# Patient Record
Sex: Female | Born: 1935 | Race: White | Hispanic: No | Marital: Single | Smoking: Never smoker
Health system: Southern US, Community
[De-identification: ages and names within clinical notes are randomized; demographics above are authoritative.]

## PROBLEM LIST (undated history)

## (undated) DIAGNOSIS — K8020 Calculus of gallbladder without cholecystitis without obstruction: Secondary | ICD-10-CM

## (undated) DIAGNOSIS — I82409 Acute embolism and thrombosis of unspecified deep veins of unspecified lower extremity: Secondary | ICD-10-CM

## (undated) DIAGNOSIS — G4730 Sleep apnea, unspecified: Secondary | ICD-10-CM

## (undated) DIAGNOSIS — I10 Essential (primary) hypertension: Secondary | ICD-10-CM

## (undated) DIAGNOSIS — R42 Dizziness and giddiness: Secondary | ICD-10-CM

## (undated) DIAGNOSIS — E039 Hypothyroidism, unspecified: Secondary | ICD-10-CM

## (undated) DIAGNOSIS — N3281 Overactive bladder: Secondary | ICD-10-CM

## (undated) DIAGNOSIS — E785 Hyperlipidemia, unspecified: Secondary | ICD-10-CM

## (undated) DIAGNOSIS — I2699 Other pulmonary embolism without acute cor pulmonale: Secondary | ICD-10-CM

## (undated) DIAGNOSIS — I4891 Unspecified atrial fibrillation: Secondary | ICD-10-CM

## (undated) DIAGNOSIS — N200 Calculus of kidney: Secondary | ICD-10-CM

## (undated) DIAGNOSIS — C4492 Squamous cell carcinoma of skin, unspecified: Secondary | ICD-10-CM

## (undated) HISTORY — DX: Hypothyroidism, unspecified: E03.9

## (undated) HISTORY — DX: Acute embolism and thrombosis of unspecified deep veins of unspecified lower extremity: I82.409

## (undated) HISTORY — DX: Overactive bladder: N32.81

## (undated) HISTORY — PX: TONSILLECTOMY AND ADENOIDECTOMY: SUR1326

## (undated) HISTORY — DX: Unspecified atrial fibrillation: I48.91

## (undated) HISTORY — PX: REPLACEMENT TOTAL KNEE: SUR1224

## (undated) HISTORY — DX: Other pulmonary embolism without acute cor pulmonale: I26.99

## (undated) HISTORY — DX: Calculus of kidney: N20.0

## (undated) HISTORY — DX: Essential (primary) hypertension: I10

## (undated) HISTORY — DX: Sleep apnea, unspecified: G47.30

## (undated) HISTORY — DX: Hyperlipidemia, unspecified: E78.5

## (undated) HISTORY — DX: Dizziness and giddiness: R42

## (undated) HISTORY — DX: Squamous cell carcinoma of skin, unspecified: C44.92

## (undated) HISTORY — PX: CHOLECYSTECTOMY: SHX55

## (undated) HISTORY — DX: Calculus of gallbladder without cholecystitis without obstruction: K80.20

---

## 1998-03-13 ENCOUNTER — Ambulatory Visit (HOSPITAL_COMMUNITY): Admission: RE | Admit: 1998-03-13 | Discharge: 1998-03-13 | Payer: Self-pay | Admitting: Obstetrics and Gynecology

## 1998-08-14 ENCOUNTER — Inpatient Hospital Stay (HOSPITAL_COMMUNITY): Admission: EM | Admit: 1998-08-14 | Discharge: 1998-08-17 | Payer: Self-pay

## 1998-08-19 ENCOUNTER — Encounter (HOSPITAL_COMMUNITY): Admission: RE | Admit: 1998-08-19 | Discharge: 1998-11-17 | Payer: Self-pay | Admitting: Internal Medicine

## 1999-11-04 ENCOUNTER — Encounter: Payer: Self-pay | Admitting: Orthopedic Surgery

## 1999-11-13 ENCOUNTER — Inpatient Hospital Stay (HOSPITAL_COMMUNITY): Admission: RE | Admit: 1999-11-13 | Discharge: 1999-11-18 | Payer: Self-pay | Admitting: Orthopedic Surgery

## 2000-08-08 ENCOUNTER — Emergency Department (HOSPITAL_COMMUNITY): Admission: EM | Admit: 2000-08-08 | Discharge: 2000-08-08 | Payer: Self-pay | Admitting: Emergency Medicine

## 2000-09-16 ENCOUNTER — Other Ambulatory Visit: Admission: RE | Admit: 2000-09-16 | Discharge: 2000-09-16 | Payer: Self-pay | Admitting: Obstetrics and Gynecology

## 2001-01-11 ENCOUNTER — Emergency Department (HOSPITAL_COMMUNITY): Admission: EM | Admit: 2001-01-11 | Discharge: 2001-01-11 | Payer: Self-pay | Admitting: Emergency Medicine

## 2001-01-11 ENCOUNTER — Encounter: Payer: Self-pay | Admitting: Emergency Medicine

## 2001-01-21 ENCOUNTER — Emergency Department (HOSPITAL_COMMUNITY): Admission: EM | Admit: 2001-01-21 | Discharge: 2001-01-21 | Payer: Self-pay | Admitting: Emergency Medicine

## 2001-08-07 ENCOUNTER — Encounter (INDEPENDENT_AMBULATORY_CARE_PROVIDER_SITE_OTHER): Payer: Self-pay | Admitting: Specialist

## 2001-08-07 ENCOUNTER — Ambulatory Visit (HOSPITAL_BASED_OUTPATIENT_CLINIC_OR_DEPARTMENT_OTHER): Admission: RE | Admit: 2001-08-07 | Discharge: 2001-08-07 | Payer: Self-pay | Admitting: Plastic Surgery

## 2002-06-08 ENCOUNTER — Ambulatory Visit (HOSPITAL_BASED_OUTPATIENT_CLINIC_OR_DEPARTMENT_OTHER): Admission: RE | Admit: 2002-06-08 | Discharge: 2002-06-08 | Payer: Self-pay | Admitting: Plastic Surgery

## 2002-06-08 ENCOUNTER — Encounter (INDEPENDENT_AMBULATORY_CARE_PROVIDER_SITE_OTHER): Payer: Self-pay | Admitting: Specialist

## 2002-09-21 ENCOUNTER — Emergency Department (HOSPITAL_COMMUNITY): Admission: EM | Admit: 2002-09-21 | Discharge: 2002-09-21 | Payer: Self-pay | Admitting: Emergency Medicine

## 2003-02-28 ENCOUNTER — Emergency Department (HOSPITAL_COMMUNITY): Admission: EM | Admit: 2003-02-28 | Discharge: 2003-02-28 | Payer: Self-pay | Admitting: Emergency Medicine

## 2003-03-18 ENCOUNTER — Ambulatory Visit (HOSPITAL_COMMUNITY): Admission: RE | Admit: 2003-03-18 | Discharge: 2003-03-18 | Payer: Self-pay

## 2003-04-09 ENCOUNTER — Encounter (HOSPITAL_BASED_OUTPATIENT_CLINIC_OR_DEPARTMENT_OTHER): Admission: RE | Admit: 2003-04-09 | Discharge: 2003-06-11 | Payer: Self-pay | Admitting: Internal Medicine

## 2003-07-12 ENCOUNTER — Encounter (HOSPITAL_BASED_OUTPATIENT_CLINIC_OR_DEPARTMENT_OTHER): Admission: RE | Admit: 2003-07-12 | Discharge: 2003-07-24 | Payer: Self-pay | Admitting: Internal Medicine

## 2003-10-11 ENCOUNTER — Encounter (HOSPITAL_BASED_OUTPATIENT_CLINIC_OR_DEPARTMENT_OTHER): Admission: RE | Admit: 2003-10-11 | Discharge: 2003-10-28 | Payer: Self-pay | Admitting: Internal Medicine

## 2004-01-15 ENCOUNTER — Encounter (HOSPITAL_BASED_OUTPATIENT_CLINIC_OR_DEPARTMENT_OTHER): Admission: RE | Admit: 2004-01-15 | Discharge: 2004-01-29 | Payer: Self-pay | Admitting: Internal Medicine

## 2005-06-18 ENCOUNTER — Emergency Department (HOSPITAL_COMMUNITY): Admission: EM | Admit: 2005-06-18 | Discharge: 2005-06-19 | Payer: Self-pay | Admitting: Emergency Medicine

## 2007-01-17 ENCOUNTER — Ambulatory Visit: Payer: Self-pay | Admitting: Internal Medicine

## 2007-02-07 ENCOUNTER — Encounter: Payer: Self-pay | Admitting: Internal Medicine

## 2007-02-07 ENCOUNTER — Ambulatory Visit: Payer: Self-pay | Admitting: Internal Medicine

## 2007-12-13 ENCOUNTER — Other Ambulatory Visit: Admission: RE | Admit: 2007-12-13 | Discharge: 2007-12-13 | Payer: Self-pay | Admitting: Obstetrics & Gynecology

## 2008-02-21 HISTORY — PX: HYSTEROSCOPY WITH D & C: SHX1775

## 2008-03-18 ENCOUNTER — Encounter: Payer: Self-pay | Admitting: Obstetrics & Gynecology

## 2008-03-18 ENCOUNTER — Ambulatory Visit (HOSPITAL_BASED_OUTPATIENT_CLINIC_OR_DEPARTMENT_OTHER): Admission: RE | Admit: 2008-03-18 | Discharge: 2008-03-18 | Payer: Self-pay | Admitting: Obstetrics & Gynecology

## 2009-07-28 DIAGNOSIS — I872 Venous insufficiency (chronic) (peripheral): Secondary | ICD-10-CM | POA: Insufficient documentation

## 2009-07-28 DIAGNOSIS — F329 Major depressive disorder, single episode, unspecified: Secondary | ICD-10-CM | POA: Insufficient documentation

## 2009-07-28 DIAGNOSIS — I471 Supraventricular tachycardia: Secondary | ICD-10-CM | POA: Insufficient documentation

## 2009-07-28 DIAGNOSIS — Z8719 Personal history of other diseases of the digestive system: Secondary | ICD-10-CM | POA: Insufficient documentation

## 2009-09-09 DIAGNOSIS — I8290 Acute embolism and thrombosis of unspecified vein: Secondary | ICD-10-CM | POA: Insufficient documentation

## 2010-01-28 DIAGNOSIS — Z87448 Personal history of other diseases of urinary system: Secondary | ICD-10-CM | POA: Insufficient documentation

## 2010-02-04 ENCOUNTER — Emergency Department (HOSPITAL_COMMUNITY): Admission: EM | Admit: 2010-02-04 | Discharge: 2010-02-04 | Payer: Self-pay | Admitting: Emergency Medicine

## 2010-04-29 ENCOUNTER — Ambulatory Visit (HOSPITAL_COMMUNITY): Admission: RE | Admit: 2010-04-29 | Discharge: 2010-04-30 | Payer: Self-pay | Admitting: General Surgery

## 2010-04-29 ENCOUNTER — Encounter (INDEPENDENT_AMBULATORY_CARE_PROVIDER_SITE_OTHER): Payer: Self-pay | Admitting: General Surgery

## 2010-05-04 DIAGNOSIS — Z2839 Other underimmunization status: Secondary | ICD-10-CM | POA: Insufficient documentation

## 2010-11-05 LAB — PROTIME-INR
INR: 1.32 (ref 0.00–1.49)
Prothrombin Time: 16.6 seconds — ABNORMAL HIGH (ref 11.6–15.2)
Prothrombin Time: 26.3 seconds — ABNORMAL HIGH (ref 11.6–15.2)

## 2010-11-05 LAB — CBC
HCT: 38.8 % (ref 36.0–46.0)
Hemoglobin: 13.2 g/dL (ref 12.0–15.0)
MCH: 30 pg (ref 26.0–34.0)
MCH: 30.4 pg (ref 26.0–34.0)
MCHC: 34.6 g/dL (ref 30.0–36.0)
MCV: 87.8 fL (ref 78.0–100.0)
MCV: 88 fL (ref 78.0–100.0)
Platelets: 134 10*3/uL — ABNORMAL LOW (ref 150–400)
Platelets: 173 10*3/uL (ref 150–400)
RBC: 4.41 MIL/uL (ref 3.87–5.11)
RDW: 14.4 % (ref 11.5–15.5)
WBC: 7.2 10*3/uL (ref 4.0–10.5)

## 2010-11-05 LAB — COMPREHENSIVE METABOLIC PANEL
ALT: 22 U/L (ref 0–35)
AST: 34 U/L (ref 0–37)
Alkaline Phosphatase: 107 U/L (ref 39–117)
GFR calc Af Amer: >60 mL/min (ref >60)
Glucose, Bld: 85 mg/dL (ref 70–99)
Potassium: 4.2 mEq/L (ref 3.5–5.1)
Sodium: 142 mEq/L (ref 135–145)
Total Protein: 8.2 g/dL (ref 6.0–8.3)

## 2010-11-05 LAB — TYPE AND SCREEN
ABO/RH(D): A POS
Antibody Screen: NEGATIVE

## 2010-11-05 LAB — APTT: aPTT: 53 seconds — ABNORMAL HIGH (ref 24–37)

## 2010-11-09 LAB — DIFFERENTIAL
Basophils Absolute: 0.2 10*3/uL — ABNORMAL HIGH (ref 0.0–0.1)
Eosinophils Relative: 2 % (ref 0–5)
Lymphocytes Relative: 23 % (ref 12–46)
Monocytes Absolute: 0.6 10*3/uL (ref 0.1–1.0)
Monocytes Relative: 8 % (ref 3–12)
Neutro Abs: 4.3 10*3/uL (ref 1.7–7.7)

## 2010-11-09 LAB — COMPREHENSIVE METABOLIC PANEL
AST: 73 U/L — ABNORMAL HIGH (ref 0–37)
Albumin: 3.5 g/dL (ref 3.5–5.2)
Alkaline Phosphatase: 125 U/L — ABNORMAL HIGH (ref 39–117)
BUN: 6 mg/dL (ref 6–23)
Chloride: 106 mEq/L (ref 96–112)
Creatinine, Ser: 0.58 mg/dL (ref 0.4–1.2)
GFR calc Af Amer: >60 mL/min (ref >60)
Potassium: 4.1 mEq/L (ref 3.5–5.1)
Total Bilirubin: 1.4 mg/dL — ABNORMAL HIGH (ref 0.3–1.2)
Total Protein: 8.6 g/dL — ABNORMAL HIGH (ref 6.0–8.3)

## 2010-11-09 LAB — CBC
Platelets: 169 10*3/uL (ref 150–400)
RDW: 13.6 % (ref 11.5–15.5)
WBC: 6.8 10*3/uL (ref 4.0–10.5)

## 2011-01-05 NOTE — Op Note (Signed)
NAME:  COXKatiya, Katrina [name]                   ACCOUNT NO.:  [id number]   MEDICAL RECORD NO.:  [id number]          PATIENT TYPE:  AMB   LOCATION:  [facility]                         FACILITY:  [facility]   PHYSICIAN:  M. Leda Quail, MD  DATE OF BIRTH:  Sep 26, 1931   DATE OF PROCEDURE:  DATE OF DISCHARGE:                               OPERATIVE REPORT   PREOPERATIVE DIAGNOSES:  71. A 77 year old G0 single white female with postmenopausal bleeding.  2. Endometrial polyp noted on sonohysterogram.  3. Obesity.  4. Hypothyroidism.  5. Obstructive sleep apnea.  6. Hypertension.  7. History of pulmonary emboli while on hormone placement therapy, now      on anticoagulation with Coumadin.   POSTOPERATIVE DIAGNOSES:  4. A 77 year old G0 single white female with postmenopausal bleeding.  2. Endometrial polyp noted on sonohysterogram.  3. Obesity.  4. Hypothyroidism.  5. Obstructive sleep apnea.  6. Hypertension.  7. History of pulmonary emboli while on hormone placement therapy, now      on anticoagulation with Coumadin.   PROCEDURES:  1. Hysteroscopy.  2. Polyp resection.  3. D&C.   SURGEON:  M. Leda Quail, MD.   ASSISTANT:  OR staff.   ANESTHESIA:  LMA, Dr. Okey Dupre oversaw the case.   FINDINGS:  Endocervical and endometrial polyps noted.  Otherwise, there  was an atrophic-appearing endometrium.   SPECIMENS:  Polyp and endometrial curettings sent to pathology.   ESTIMATED BLOOD LOSS:  Minimal.   FLUIDS:  500 mL of LR.   URINE OUTPUT:  75 mL drained in the Foley catheter at the beginning of  the procedure.   FLUID DEFICIT:  For the hysteroscopic portion of the procedure was 20 mL  of 3% sorbitol.   COMPLICATIONS:  None.   INDICATIONS:  Ms. Katrina [name] is a very nice 77 year old G0 single white female  who has a history of postmenopausal bleeding.  She referred herself  after having been a patient in our clinic years ago.  She was initially  evaluated with an endometrial biopsy which showed  scanty fragments of  proliferative endometrium without hyperplasia or malignancy and benign  endocervical mucosa.  She was treated initially with Prometrium 200 mg  x14 days for 3 months.  She continued to have some irregular spotting  and bleeding.  Therefore sonohysterogram was performed on January 31, 2008.  The uterus is 6.4 x 3.6 x 3 cm with an 8.8 mm endometrial stripe.  She  does have a small fundal fibroid.  There were two polyps that were  noted, an 18 x 6 mm posterior polyp and a 5 x 6 mm anterior lower  uterine segment polyp.  The patient was counseled about removal of the  polyps using a hysteroscope.  Risks and benefits were explained and are  documented in the office chart.  The patient clearly voiced  understanding of the risks and was ready to proceed.  She is here for  this today.   PROCEDURE:  The patient is taken to the operating room.  She is placed  in the supine position.  Anesthesia was administered  by anesthesia staff  without difficulty.  She had a running IV in the left hand.  Once the  patient was adequately anesthetized, her legs were positioned in the  Stanton stirrups in the high lithotomy position.  Perineum, inner thighs  and vagina were prepped and draped in normal sterile fashion.  A red  rubber Foley catheter was used to drain the bladder of all urine.  Speculum was placed in the vagina.  The anterior lip of the cervix was  grasped with single-tooth tenaculum.  At this point, a paracervical  block was placed.  There was 10 mL of 1% lidocaine instilled at the 3,  6, 9 and 12 o'clock positions, 2.5 mL of the 1% lidocaine solution were  instilled at each location.  The uterus sounded to about 7 cm.  Using  [facility] dilators, the cervix was dilated up to #21.  The 5 mm diagnostic  hysteroscope was used at this point.  I used 3% sorbitol as a  hysteroscopic media.  The scope was passed through the endocervical  canal and the smaller lower uterus segment polyp  actually almost  appeared to be endocervical polyp.  The second larger polyp was also  noted.  The endometrium otherwise appeared atrophic.  The hysteroscope  was removed.  Polyp forceps was obtained.  Multiple passes of this polyp  forceps were attempted.  Definitely, the smaller polyp was grasped at  this point but I was unsure by the larger polyp.  Therefore the  endometrial cavity was revisualized with the hysteroscope.  A larger  polyp was there.  The cervix was dilated up to a #23 to see if I could  pass the polyp forceps with more ease.  This was successful and with the  next pass of the polyp forceps, the larger endometrial polyp was  obtained.  The endometrial cavity was curetted until rough or gritty  texture was noted in all quadrants.  There was scant tissue that was  obtained with this curetting.  The hysteroscope was then used to  revisualize the cavity.  The endometrium looked rough but there was no  evidence of polyps at this point.   The hysteroscope was removed.  The tenaculum was removed from the  anterior lip of the cervix.  Silver nitrate was used to achieve  hemostasis from the tenaculum sites.  The speculum was removed from the  vagina.  Betadine was  cleansed from the skin and the patient's legs were positioned back in  supine position.  She was awakened from anesthesia without difficulty  and taken to the recovery room in stable condition.  Sponge, lap, needle  and instrument counts were correct x2.      Lum Keas, MD  Electronically Signed     MSM/MEDQ  D:  03/18/2008  T:  03/18/2008  Job:  [id number]   cc:   Barry Dienes. Eloise Harman, M.D.  Fax: [phone]

## 2011-01-05 NOTE — Assessment & Plan Note (Signed)
[facility]                         GASTROENTEROLOGY OFFICE NOTE   NAME:Katrina [name], Katrina [name]                          MRN:          [id number]  DATE:01/17/2007                            DOB:          January 25, 1932    REASON FOR CONSULTATION:  Recent problems with bowel habits and  screening colonoscopy.   HISTORY OF PRESENT ILLNESS:  This is a 77 year old white female with  history of atrial fibrillation, hyperlipidemia, hypothyroidism, morbid  obesity, hypertension and recurrent pulmonary embolus for which she is  on chronic Coumadin therapy.  She is referred through the courtesy of  Dr. Eloise Harman regarding the above listed issues.  It was apparently  recommended to the patient to have screening colonoscopy some 4-5 years  previous.  However, she had some apprehensions regarding the  preparation.  In early April, she had problems with new onset  constipation.  As well, generalized lower abdominal pain associated with  constipation.  No nausea, vomiting or fevers.  No hematochezia or  melena.  No weight loss.  She was treated with stool softeners.  Her  problem gradually resolved over the course of 3-4 weeks.  Currently, she  is asymptomatic.  The patient reports having had pulmonary embolus in  1991 with multiple pulmonary emboli, recurrent problem 3-4 years later  off Coumadin.  From that point forward, it was recommended that she stay  on Coumadin long term.  She has had surgery where she has gone off  Coumadin, though, was placed on what sounds like Lovenox injections.   PAST MEDICAL HISTORY:  1. Hypertension.  2. Atrial fibrillation.  3. Hyperlipidemia.  4. Hypothyroidism.  5. History of depression.  6. Kidney stones.  7. Sleep apnea.  8. Morbid obesity.  9. Recurrent pulmonary embolism on chronic Coumadin therapy.   PAST SURGICAL HISTORY:  Bilateral knee replacements.   ALLERGIES:  PENICILLIN, CODEINE.   CURRENT MEDICATIONS:  1. Lanoxin 0.125  mg daily.  2. Risperdal 1 mg at night.  3. Cymbalta 60 mg daily.  4. Lipitor 40 mg daily.  5. Synthroid 125 mcg daily.  6. Coumadin 1 mg daily alternating with 2 mg daily.  7. Aldactone 50 mg daily.  8. Lasix 20 mg daily.  9. Toprol XL 25 mg daily.  10.Zaroxolyn 2.5 mg daily.  11.Potassium, unspecified dosage b.i.d.   FAMILY HISTORY:  No family history of gastrointestinal malignancy.   SOCIAL HISTORY:  The patient is single and lives alone.  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  She does not  smoke.  Rarely uses alcohol.   REVIEW OF SYSTEMS:  Per diagnostic evaluation form.   PHYSICAL EXAMINATION:  GENERAL:  Pleasant, elderly female in no acute  distress.  VITAL SIGNS:  Blood pressure 124/72, heart rate 72 and regular, weight  259.6 pounds, height 5 feet 2 inches.  HEENT:  Sclerae are anicteric.  Conjunctivae are pink.  Oral mucosa is  intact.  No adenopathy.  CHEST:  Clear.  HEART:  Regular.  ABDOMEN:  Obese, without tenderness, mass or hernia.  RECTAL:  Deferred.  EXTREMITIES:  Chronic stasis changes bilaterally.   IMPRESSION:  This is a 77 year old female with multiple significant  medical problems who had problems with constipation and abdominal  discomfort, now resolved after a course of stool softeners.  She also  presents regarding screening coloscopy.  In addition to her multiple  comorbidities, there is the issue of her Coumadin therapy.  I discussed  with her in detail the nature of the procedure as well as risks,  benefits and alternatives.  We also discussed the issue with her  Coumadin.  We have elected to perform her examination on Coumadin,  understanding the associated risks and limitations.  She has agreed to  obtain her INR the day prior.     Wilhemina Bonito. Marina Goodell, MD  Electronically Signed    JNP/MedQ  DD: 01/17/2007  DT: 01/17/2007  Job #: [id number]   cc:   Barry Dienes. Eloise Harman, M.D.

## 2011-01-08 NOTE — Discharge Summary (Signed)
[facility]  Patient:    Katrina, [name]                         MRN: [id number] Adm. Date:  11/13/99 Disc. Date: 11/18/99 Attending:  Vania Rea. Supple, M.D. Dictator:   Alexzandrew L. Perkins, P.A.-C.                           Discharge Summary  ADMITTING DIAGNOSES:  1. Osteoarthritis, left knee.  2. History of pulmonary embolism x 2.  3. History of phlebitis.  4. History of kidney stones.  5. Hypertension.  6. Hypothyroidism.  7. Chronic atrial fibrillation.  DISCHARGE DIAGNOSES:  1. Left knee end-stage osteoarthritis, status post left total knee replacement  arthroplasty.  2. Postoperative anemia.  3. History of pulmonary embolism x 2.  4. History of phlebitis.  5. History of kidney stones.  6. Hypertension.  7. Hyperthyroidism.  8. Chronic atrial fibrillation.  PROCEDURE:  The patient was taken to the OR on November 13, 1999 and underwent a left total knee replacement arthroplasty.  Surgeon - Dr. Francena Hanly.  Assistant - Annye Rusk, P.A.-C.  Surgery done under general anesthesia.  Components used were an [employer] total knee, utilizing a 7 femur, a 7 tibia, a 10 mm polyethylene insert, and a 26 mm patella.  CONSULTS:  Rehabilitation services.  BRIEF HISTORY:  The patient is a 77 year old female with long-standing history f left knee pain.  X-rays in the office show obliteration of the medial joint line with significant varus deformity impairment.  She has been treated conservatively in the past utilizing oral anti-inflammatory and analgesics.  She is 2 years out from her right total knee replacement arthroplasty, which she has done quite well with.  It is felt that she was best served by proceeding with total knee replacement on the left.  Risks and benefits of the procedure were discussed and she accepted these going into surgery.  LABORATORY DATA:  CBC on admission:  Hemoglobin 13.2, hematocrit 38.9,  white cell count 7.8, red cell count 4.55.  Serial H&H were followed throughout the hospital course.  Postoperative hemoglobin down to 10.3, hematocrit 30.6.  Last noted CBC showed a hemoglobin of 9.2 with hematocrit of 27.1.  PT and PTT on admission were 26.6 with an INR of 3.9, PTT 64 (preoperative).  Lab recheck prior to surgery PT was normal at 13.4 with an INR of 1.3 and PTT of 24.  The patient was started back on Coumadin and serial protimes were followed.  Coumadin last noted levels of PT prior to discharge were 18.7 with an INR of 2.0.  Chemistry panel on admission ll within normal limits.  Followed BMET on November 14, 1999 - slightly elevated glucose from 94 to 117, otherwise BMET within normal limits.  Urinalysis on admission showed a trace leukocyte esterase, otherwise negative.  Blood group type A positive.  EKG dated November 04, 1999, normal sinus rhythm, borderline EKG.  No old tracings to compare.  Confirmed by Dr. Cassell Clement.  Chest x-ray dated November 04, 1999, preoperatively, shows no evidence of acute pulmonary disease, minimal osteophytes were seen in the thoracic spine.  HOSPITAL COURSE:  The patient was admitted to [facility], taken to the OR, and underwent the above-stated procedure without complication.  The patient  tolerated the procedure well.  Later transferred to recovery room and then to the orthopedic floor to  continue postoperative care.  Vital signs were followed. The patient was placed on analgesics for pain control postoperatively.  Also total nee protocol.  Pharmacy was consulted to assist with Lovenox and Coumadin coverage.  She will be placed on Lovenox due to the significant history of having to pulmonary embolisms in the past and be utilized until her Coumadin was therapeutic. Rehab services were consulted.  The patient was seen and evaluated postoperatively and felt that she was making excellent gains with physical  therapy, which had been initiated after surgery.  It was felt that due to the fact that she was doing well that she would not require inpatient rehab.  She did progress well with physical therapy and was up ambulating approximately 160 feet and greater than 250 feet before discharge.  Wound care and wound observation were followed throughout the hospital course.  Dressing change initiated on postop day #2.  The wound was healing well.  Discharge planning arranged for Home Health PT, Home Health nursing and equipment.  By postop day #5, November 18, 1999, she was doing quite well and he patient was discharged home on discharge meds.  PLAN:  1. Discharged home on November 18, 1999.  2. Discharge diagnoses - please see above.  3. Discharge medications:     1. Percocet #30 p.r.n. pain.     2. Robaxin 500 mg #30 p.r.n. spasm.     3. Coumadin as per pharmacy protocol.     4. Trinsicon.     5. Resume home meds.  4. Diet - Low sodium diet.  5. Followup:  Follow up in two weeks from date of surgery.  Call the office for an     appointment, [phone].  6. Activity - The patient is weightbearing as tolerated to the left lower     extremity.  Home Health physical therapy and Home Health nursing for total nee     protocol, and also protime draws.  7. Disposition - Home.  8. Condition on discharge:  Improved. DD:  12/23/99 TD:  12/25/99 Job: [id number]

## 2011-01-08 NOTE — Consult Note (Signed)
NAME:  Katrina [name], Katrina [name]                             ACCOUNT NO.:  [id number]   MEDICAL RECORD NO.:  [id number]                   PATIENT TYPE:  REC   LOCATION:  FOOT                                 FACILITY:  [facility]   PHYSICIAN:  Jonelle Sports. Sevier, M.D.              DATE OF BIRTH:  1932-03-17   DATE OF CONSULTATION:  04/11/2003  DATE OF DISCHARGE:                                   CONSULTATION   HISTORY:  This 77 year old white female is referred by Dr. Eloise Harman for  assistance with management of severe stasis dermatitis with weeping of the  lower extremities.   The patient's medical history includes prior thrombophlebitis with pulmonary  embolism, and she has in fact been chronically anticoagulated for years  (this because she had recurrence once the initial anticoagulation was  tapered and discontinued after a reasonable period of time). She also has  several other problems to include obesity with sleep apnea, hypothyroidism  that is compensated, and hyperlipidemia. She is not diabetic.   With that background history, the patient began about three or four weeks  ago with extreme swelling and some cellulitis of the lower extremities. She  was started on Keflex and developed a rather severe allergic dermatitis to  that and accordingly was switched to Zithromax. Her allergic rash has  abated, but she has continued with fairly significant stasis dermatitis  bilaterally and persistence of fairly severe edema. She reports rather  severe weeping from her legs five days ago which has greatly diminished in  the past several days. She has not been aware of any open ulcerations. She  is on medication with both Lasix and spironolactone.   PAST MEDICAL HISTORY:  Includes those things previously mentioned as well as  tachycardia and vertigo.   MEDICATIONS:  Her regular medications include:  1. Paxil.  2. Wellbutrin.  3. Coumadin.  4. Lanoxin.  5. Synthroid.  6. Lopressor.  7. Lipitor.  8.  Lasix.  9. Spironolactone.   ALLERGIES:  She is allergic to Keflex.   PHYSICAL EXAMINATION:  Examination today is limited to the distal lower  extremities. Both extremities are quite obese but also significant edematous  with 2 to 3+ pitting bilaterally. Her skin temperatures are essentially  normal and reasonably symmetrical. All pulses are palpable and adequate.  Monofilament testing shows a persistence of protective sensation throughout  both feet. She does have some mild callous formation of the heels and  underlying the fifth metatarsal head on the left, but these are not  immediately addressed.   The veins on her legs are prominent, and she has rather striking stasis  dermatitis bilaterally, particularly the anterior aspect of the pretibial  areas, and this is associated on the left with considerable incrustation and  active weeping with presumed small ulcers present under some of these  crusts.    DISPOSITION:  1. The patient is discussed  with the patient in some length, and she is     accepting of our recommendations.  2. Our plan is to place her in Redwood paste wraps and cover them by Profore     wraps bilaterally with an application of 0.1% triamcinolone made to both     legs prior to application of these wraps. She is advised to stay off her     feet as much as possible. She has completed her Zithromax, and additional     antibiotics will not be added.  3. Followup visit will be to this clinic to the nurse for repeat wrap in one     week and to the M.D. in two weeks.                                               Jonelle Sports. Cheryll Cockayne, M.D.    RES/MEDQ  D:  04/11/2003  T:  04/12/2003  Job:  [id number]   cc:   Barry Dienes. Eloise Harman, M.D.  [address]  [place]  [place] [address]  Fax: [phone]

## 2011-01-08 NOTE — Op Note (Signed)
   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   POSTOPERATIVE DIAGNOSIS:  Basal cell carcinoma of the left check 0.5 cm.  Complicated open wound of the left cheek secondary to basal cell carcinoma  excision 2.0 cm in length.   PROCEDURE:  1. Excision of basal cell carcinoma greater than 0.5 cm.  2. Complex wound closure of the left cheek 2.0 cm.   SURGEON:  Etter Sjogren, M.D.   ANESTHESIA:  1% Xylocaine with epinephrine plus bicarb.   INDICATIONS FOR PROCEDURE:  This is a 77 year old woman who has had previous  skin cancers, now with a basal cell carcinoma of the left cheek.  Options  were discussed including the use of Aldara versus surgery. She preferred  surgery.  She understood the nature of the procedure, the risks, possibility  of further surgery depending upon the final pathology report, scarring, and  wished to proceed.   DESCRIPTION OF PROCEDURE:  The patient was taken to the operating room and  placed in the head elevated position. The elliptical incision was marked  taking a margin of what appeared to be normal tissue around the erythematous  area identified as the basal cell carcinoma. She was prepped with Betadine  and draped with sterile drapes.  Satisfactory indices were achieved and the  elliptical incision was performed. The wound was irrigated thoroughly. The  wound edges were undermined and good hemostasis having been noted, the  closure with 4-0 Vicryl interrupted inverted deep sutures and interrupted  inverted deep dermal sutures and a running  6-0 Prolene simple suture.  Antibiotic ointment and a dry sterile dressing were applied.  She tolerated  this procedure well.   DISPOSITION:  She may remove the dressing tomorrow and shower.  We will see  her back in the office in six days for recheck.                                               Etter Sjogren, MD    DB/MEDQ  D:  06/08/2002  T:  06/09/2002  Job:  [id number]

## 2011-01-08 NOTE — Op Note (Signed)
[place]  Patient:    Katrina [name], Katrina D. Visit Number: [id number] MRN: [id number]          Service Type: Attending:  Teena Irani. Odis Luster, M.D. Dictated by:   Teena Irani. Odis Luster, M.D. Proc. 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  POSTOPERATIVE DIAGNOSES: 1. 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 2. Complicated open wound of the left cheek at 3.5 cm secondary to the    excision of this area of squamous cell carcinoma.  PROCEDURES: 1. Excision of squamous cell carcinoma, greater than 1.0 cm. 2. Complex wound repair of 3.5 cm cheek wound resulting from a cancer    excision.  SURGEON:  Teena Irani. Odis Luster, M.D.  ANESTHESIA:  1% Xylocaine with epinephrine plus bicarbonate.  CLINICAL NOTE:  A 77 year old woman with a squamous cell carcinoma of the left cheek.  She is referred for a wider excision of this area by her dermatologist.  The nature of the procedure and the risks were well-understood by the patient, including the possibility of some bleeding since she is on Coumadin.  She understood the possibility of further surgery dependent upon the pathology report.  She wished to proceed.  DESCRIPTION OF PROCEDURE:  The patient was taken to the operating room and placed supine.  The elliptical excision was then planned, taking a portion of what appeared to be grossly normal tissue around the erythematous area which marked the previous biopsy by the dermatologist.  The area was prepped with Betadine, and she was draped with sterile drapes.  After satisfactory local anesthesia was achieved using 1% Xylocaine with epinephrine plus bicarbonate, the elliptical excision was performed.  There was minimal bleeding.  The wound was irrigated thoroughly.  The wound edges were undermined.  Again thorough irrigation and excellent  hemostasis having been confirmed, a layered closure was performed using 4-0 Vicryl interrupted inverted deep sutures, 4-0 Vicryl interrupted deep dermal sutures, and 6-0 Prolene simple interrupted sutures and interrupted vertical mattress sutures as needed for wound edge eversion. She tolerated this procedure well.  There was no evidence of any displacement of her lower eyelid.  DISPOSITION:  No lifting, no exercising for two days.  Medications as before. Will see her back in one week for recheck.  She may remove the dressing tomorrow evening and shower and pat it dry. Dictated by:   Teena Irani. Odis Luster, M.D. Attending:  Teena Irani. Odis Luster, M.D. DD:  08/07/01 TD:  08/07/01 Job: [id number]

## 2011-04-24 HISTORY — PX: CYSTOSCOPY WITH STENT PLACEMENT: SHX5790

## 2011-04-27 ENCOUNTER — Other Ambulatory Visit: Payer: Self-pay | Admitting: Urology

## 2011-04-27 ENCOUNTER — Ambulatory Visit (HOSPITAL_BASED_OUTPATIENT_CLINIC_OR_DEPARTMENT_OTHER)
Admission: RE | Admit: 2011-04-27 | Discharge: 2011-04-27 | Disposition: A | Discharge status: Home / Self Care | Payer: PRIVATE HEALTH INSURANCE | Source: Ambulatory Visit | Attending: Urology | Admitting: Urology

## 2011-04-27 DIAGNOSIS — E7800 Pure hypercholesterolemia, unspecified: Secondary | ICD-10-CM | POA: Insufficient documentation

## 2011-04-27 DIAGNOSIS — I4891 Unspecified atrial fibrillation: Secondary | ICD-10-CM | POA: Insufficient documentation

## 2011-04-27 DIAGNOSIS — Z96659 Presence of unspecified artificial knee joint: Secondary | ICD-10-CM | POA: Insufficient documentation

## 2011-04-27 DIAGNOSIS — Z01812 Encounter for preprocedural laboratory examination: Secondary | ICD-10-CM | POA: Insufficient documentation

## 2011-04-27 DIAGNOSIS — Z8673 Personal history of transient ischemic attack (TIA), and cerebral infarction without residual deficits: Secondary | ICD-10-CM | POA: Insufficient documentation

## 2011-04-27 DIAGNOSIS — R310 Gross hematuria: Secondary | ICD-10-CM | POA: Insufficient documentation

## 2011-04-27 DIAGNOSIS — N898 Other specified noninflammatory disorders of vagina: Secondary | ICD-10-CM | POA: Insufficient documentation

## 2011-04-27 DIAGNOSIS — N200 Calculus of kidney: Secondary | ICD-10-CM | POA: Insufficient documentation

## 2011-04-27 DIAGNOSIS — G4730 Sleep apnea, unspecified: Secondary | ICD-10-CM | POA: Insufficient documentation

## 2011-04-27 DIAGNOSIS — I10 Essential (primary) hypertension: Secondary | ICD-10-CM | POA: Insufficient documentation

## 2011-04-27 DIAGNOSIS — E039 Hypothyroidism, unspecified: Secondary | ICD-10-CM | POA: Insufficient documentation

## 2011-04-27 LAB — POCT I-STAT 4, (NA,K, GLUC, HGB,HCT)
Glucose, Bld: 98 mg/dL (ref 70–99)
HCT: 41 % (ref 36.0–46.0)
Hemoglobin: 13.9 g/dL (ref 12.0–15.0)
Sodium: 145 mEq/L (ref 135–145)

## 2011-04-27 LAB — PROTIME-INR: INR: 1.25 (ref 0.00–1.49)

## 2011-05-18 ENCOUNTER — Ambulatory Visit (HOSPITAL_BASED_OUTPATIENT_CLINIC_OR_DEPARTMENT_OTHER)
Admission: RE | Admit: 2011-05-18 | Discharge: 2011-05-18 | Disposition: A | Discharge status: Home / Self Care | Payer: PRIVATE HEALTH INSURANCE | Source: Ambulatory Visit | Attending: Urology | Admitting: Urology

## 2011-05-18 DIAGNOSIS — Z7901 Long term (current) use of anticoagulants: Secondary | ICD-10-CM | POA: Insufficient documentation

## 2011-05-18 DIAGNOSIS — Z86718 Personal history of other venous thrombosis and embolism: Secondary | ICD-10-CM | POA: Insufficient documentation

## 2011-05-18 DIAGNOSIS — Z01812 Encounter for preprocedural laboratory examination: Secondary | ICD-10-CM | POA: Insufficient documentation

## 2011-05-18 DIAGNOSIS — I4891 Unspecified atrial fibrillation: Secondary | ICD-10-CM | POA: Insufficient documentation

## 2011-05-18 DIAGNOSIS — Z79899 Other long term (current) drug therapy: Secondary | ICD-10-CM | POA: Insufficient documentation

## 2011-05-18 DIAGNOSIS — E669 Obesity, unspecified: Secondary | ICD-10-CM | POA: Insufficient documentation

## 2011-05-18 DIAGNOSIS — G4733 Obstructive sleep apnea (adult) (pediatric): Secondary | ICD-10-CM | POA: Insufficient documentation

## 2011-05-18 DIAGNOSIS — N200 Calculus of kidney: Secondary | ICD-10-CM | POA: Insufficient documentation

## 2011-05-21 LAB — PROTIME-INR
INR: 1.5
Prothrombin Time: 18.3 — ABNORMAL HIGH

## 2011-05-21 LAB — URINE MICROSCOPIC-ADD ON

## 2011-05-21 LAB — URINALYSIS, ROUTINE W REFLEX MICROSCOPIC
Glucose, UA: NEGATIVE
Specific Gravity, Urine: 1.014
pH: 5.5

## 2011-05-21 LAB — CBC
HCT: 37.7
Platelets: 173
RDW: 14.3

## 2011-05-21 LAB — BASIC METABOLIC PANEL
BUN: 8
Calcium: 8.6
Creatinine, Ser: 0.48
GFR calc non Af Amer: >60
Glucose, Bld: 95
Potassium: 3.8

## 2011-05-28 NOTE — Op Note (Signed)
[name], [name]                   ACCOUNT NO.:  [id number]  MEDICAL RECORD NO.:  [id number]  LOCATION:                                 FACILITY:  PHYSICIAN:  Excell Seltzer. Annabell Howells, M.D.    DATE OF BIRTH:  06/22/1932  DATE OF PROCEDURE:  05/18/2011 DATE OF DISCHARGE:                              OPERATIVE REPORT   PROCEDURES:  Cystoscopy, right uteroscopic stone extraction with holmium lasertripsy, and right double-J stent removal and replacement.  PREOPERATIVE DIAGNOSIS:  Right renal pelvic stone.  POSTOPERATIVE DIAGNOSIS:  Right renal pelvic stone.  SURGEON:  Dr. Excell Seltzer. Annabell Howells, M.D.  ANESTHESIA:  General.  SPECIMEN:  Stone fragments.  DRAIN:  6-French 24 cm double-J stent.  COMPLICATIONS:  None.  INDICATIONS:  Katrina [name] is a 77 year old white female who has about a 7 mm- 8 mm right renal pelvic stone with some history of urinary tract infections.  The stone was only faintly radiopaque, and she is on chronic Coumadin.  It was felt that lithotripsy was not her best option. She underwent stent placement approximately 2 weeks ago and returns today for definitive stone management.  FINDINGS AND PROCEDURE:  She had been on Keflex for suppression and was given Cipro preoperatively.  She was taken to operating room where she was fitted with PAS hose.  A general anesthetic was induced.  She was placed in lithotomy position.  Her perineum and genitalia were prepped with Betadine solution.  She was draped in usual sterile fashion.  Cystoscopy was performed using a 22-French scope and 12-degree lens. The bladder had been thoroughly inspected.  Her initial procedure today, the stent was noted at the right ureteral orifice.  There was some previous stent edema.  The stent was grasped with grasping forceps, pulled the urethral meatus, and a wire was passed to the kidney.  The old stent was removed.  At this point, a 12-French dilator was passed to the kidney and a 6- [place] short  ureteroscope was then passed alongside the wire, but I was unable to get to the level of the stone.  At this point, 38 cm digital access sheath was passed to just below the kidney.  The stone could be faintly visualized in the proximal ureter/renal pelvic area.  The 6.4 flexible scope was then passed through the access sheath to the level of the stone.  The stone was visualized and appeared to have a very fragile calcium-based appearance. There was some bleeding from the mucosa related to the dilation.  A 200 micron laser fiber was then passed and the stone was engaged at 0.5 watts and 20 Hz.  The stone fragmented readily into 1-2 mm fragments.  At this point, a nitinol basket was used to grasp several small fragments.  There was some blood clot adherent to the fragments which actually aided removal.  After removal of all graspable fragments, inspection of the remainder of the collecting system revealed no significant residual fragments.  At this point, the ureteroscope was removed and a guidewire was passed through the access sheath to the kidney, and the access sheath was removed.  The cystoscope was reinserted over the wire  and a 6-French 24- cm double-J stent with string was then inserted to the kidney under fluoroscopic guidance over the wire.  Wire was removed leaving good coil in the kidney and good coil in the bladder.  The bladder was drained. The cystoscope was removed leaving the stent string exiting urethra. The string was knotted close to the meatus and then trimmed.  The drapes were removed.  The patient was taken down from lithotomy position.  Her anesthetic was reversed.  She was admitted to the recovery room in stable condition.  There were no complications.     Excell Seltzer. Annabell Howells, M.D.     JJW/MEDQ  D:  05/18/2011  T:  05/18/2011  Job:  [id number]  cc:   Barry Dienes. Eloise Harman, M.D. Fax: [phone]  Electronically Signed by Bjorn Pippin M.D. on 05/28/2011 01:02:45 PM

## 2011-09-01 DIAGNOSIS — E8809 Other disorders of plasma-protein metabolism, not elsewhere classified: Secondary | ICD-10-CM | POA: Insufficient documentation

## 2013-05-02 DIAGNOSIS — I83009 Varicose veins of unspecified lower extremity with ulcer of unspecified site: Secondary | ICD-10-CM | POA: Insufficient documentation

## 2013-05-07 ENCOUNTER — Encounter (HOSPITAL_BASED_OUTPATIENT_CLINIC_OR_DEPARTMENT_OTHER): Payer: PRIVATE HEALTH INSURANCE | Attending: General Surgery

## 2013-05-07 DIAGNOSIS — I872 Venous insufficiency (chronic) (peripheral): Secondary | ICD-10-CM | POA: Insufficient documentation

## 2013-05-07 DIAGNOSIS — L97809 Non-pressure chronic ulcer of other part of unspecified lower leg with unspecified severity: Secondary | ICD-10-CM | POA: Insufficient documentation

## 2013-05-07 DIAGNOSIS — Z7901 Long term (current) use of anticoagulants: Secondary | ICD-10-CM | POA: Insufficient documentation

## 2013-05-07 DIAGNOSIS — Z79899 Other long term (current) drug therapy: Secondary | ICD-10-CM | POA: Insufficient documentation

## 2013-05-07 DIAGNOSIS — Z96659 Presence of unspecified artificial knee joint: Secondary | ICD-10-CM | POA: Insufficient documentation

## 2013-05-07 DIAGNOSIS — I10 Essential (primary) hypertension: Secondary | ICD-10-CM | POA: Insufficient documentation

## 2013-05-07 DIAGNOSIS — L02419 Cutaneous abscess of limb, unspecified: Secondary | ICD-10-CM | POA: Insufficient documentation

## 2013-05-07 DIAGNOSIS — E039 Hypothyroidism, unspecified: Secondary | ICD-10-CM | POA: Insufficient documentation

## 2013-05-08 NOTE — Progress Notes (Signed)
[facility]  NAME:  Katrina [name], Katrina [name]                   ACCOUNT NO.:  [id number]  MEDICAL RECORD NO.:  [id number]      DATE OF BIRTH:  October 16, 1931  PHYSICIAN:  Ardath Sax, M.D.           VISIT DATE:                                  OFFICE VISIT   This is a 79 year old morbidly obese female, who comes to the Wound Clinic because of severe problems with venous stasis, venous ulcerations, and cellulitis of both of her legs from the ankles to the knees.  She is a lady who came here with normal vital signs, a blood pressure of 152/72, respirations 17, pulse 80, temperature 98.6.  She weighs 260 pounds, and she is around 5 feet tall.  Her medicines include Synthroid, Coumadin, metoprolol, and Lipitor.  She has had bilateral knee replacements.  She says she is hypothyroid.  She really is fairly normal in other respects.  Her lungs are clear.  Her abdomen of course is very obese.  Her legs are markedly obese and edematous.  She was treated today with Silvercel and Profore Lite, and we put some ABDs on the Profore Lite because of all the drainage, and she will come back in a week and hopefully the cellulitis will be improved, because we did put her on doxycycline, and we debrided all the dead skin away that was very foul smelling, and we also put some TCA cream with the Silvercel.  DIAGNOSIS:  Venous stasis, venous ulcers; bilateral legs, complicated with cellulitis.  OTHER DIAGNOSES:  Hypothyroidism, morbid obesity, history of congestive heart failure, and hypertension.     Ardath Sax, M.D.     PP/MEDQ  D:  05/07/2013  T:  05/08/2013  Job:  [id number]

## 2013-05-28 ENCOUNTER — Encounter (HOSPITAL_BASED_OUTPATIENT_CLINIC_OR_DEPARTMENT_OTHER): Payer: PRIVATE HEALTH INSURANCE | Attending: General Surgery

## 2013-05-28 DIAGNOSIS — I872 Venous insufficiency (chronic) (peripheral): Secondary | ICD-10-CM | POA: Insufficient documentation

## 2013-05-28 DIAGNOSIS — L97809 Non-pressure chronic ulcer of other part of unspecified lower leg with unspecified severity: Secondary | ICD-10-CM | POA: Insufficient documentation

## 2013-05-28 DIAGNOSIS — I87309 Chronic venous hypertension (idiopathic) without complications of unspecified lower extremity: Secondary | ICD-10-CM | POA: Insufficient documentation

## 2013-09-14 ENCOUNTER — Encounter: Payer: Self-pay | Admitting: Obstetrics & Gynecology

## 2013-09-17 ENCOUNTER — Ambulatory Visit (INDEPENDENT_AMBULATORY_CARE_PROVIDER_SITE_OTHER): Payer: Medicare Other | Admitting: Obstetrics & Gynecology

## 2013-09-17 ENCOUNTER — Encounter: Payer: Self-pay | Admitting: Obstetrics & Gynecology

## 2013-09-17 VITALS — BP 122/70 | HR 68 | Resp 14 | Ht 62.0 in | Wt 251.0 lb

## 2013-09-17 DIAGNOSIS — Z01419 Encounter for gynecological examination (general) (routine) without abnormal findings: Secondary | ICD-10-CM

## 2013-09-17 NOTE — Patient Instructions (Signed)

## 2013-09-17 NOTE — Progress Notes (Signed)
80 y.o. G0P0000 SingleCaucasianF here for annual exam.  Doing well.  No vaginal bleeding.  Has some questions about her MMG.  Had a "technical repeat".  Explained this to patient.  No vaginal bleeding.  Biggest issue for patient is stasis in lower extremities.  Had maggots on the skin.  Went to the wound center for four weeks for treatment.  Finished treatment 05/28/13.  Now with compression footies.  Skin has healed really well, now.    She is having more urinary leakage now.    Patient's last menstrual period was 01/22/2011.          Sexually active: no  The current method of family planning is abstinence and post menopausal status.    Exercising: no   Smoker:  no  Health Maintenance: Pap: 05/29/12 neg History of abnormal Pap:  no MMG:  08/10/13 , recheck Rt Breast  08/29/13 Normal Colonoscopy: 2008 normal BMD:   12/2007  normal TDaP:  05/2012 Screening Labs: Dr. Sharlett Iles, Hb today: PCP,  Urine today: PCP   reports that she has never smoked. She has never used smokeless tobacco. She reports that she drinks about 0.5 ounces of alcohol per week. She reports that she does not use illicit drugs.  Past Medical History  Diagnosis Date  . Pulmonary emboli     secondary HRT  . Hypothyroidism   . Elevated lipids   . Hypertension   . Overactive bladder   . Sleep apnea     on C-pap  . SCC (squamous cell carcinoma)     of left leg  . Nephrolithiasis   . Gallstone   . Broken arm   . Infection 05/2013    Wheeping Legs (Both Legs)  . Vertigo     Past Surgical History  Procedure Laterality Date  . Replacement total knee      bilateral  . Tonsillectomy and adenoidectomy    . Hysteroscopy w/d&c      polyp  . Cholecystectomy    . Surgery for right nephrolithiasis      Current Outpatient Prescriptions  Medication Sig Dispense Refill  . cycloSPORINE (RESTASIS) 0.05 % ophthalmic emulsion 1 drop 2 (two) times daily.      . digoxin (LANOXIN) 0.25 MG tablet Take 0.25 mg by mouth daily.       Marland Kitchen levothyroxine (SYNTHROID, LEVOTHROID) 125 MCG tablet Take 125 mcg by mouth daily before breakfast.      . metoprolol succinate (TOPROL-XL) 25 MG 24 hr tablet Take 25 mg by mouth daily.      Marland Kitchen warfarin (COUMADIN) 1 MG tablet Take 1 mg by mouth daily. 1 mg every day except 2 mg on Friday       No current facility-administered medications for this visit.    Family History  Problem Relation Age of Onset  . Prostate cancer Brother   . Hypertension Father   . Hypertension Mother     ROS:  Pertinent items are noted in HPI.  Otherwise, a comprehensive ROS was negative.  Exam:   BP 122/70  Pulse 68  Resp 14  Ht 5\' 2"  (1.575 m)  Wt 251 lb (113.853 kg)  BMI 45.90 kg/m2  LMP 01/22/2011  Weight change: -2lb  Height: 5\' 2"  (157.5 cm)  Ht Readings from Last 3 Encounters:  09/17/13 5\' 2"  (1.575 m)    General appearance: alert, cooperative and appears stated age Head: Normocephalic, without obvious abnormality, atraumatic Neck: no adenopathy, supple, symmetrical, trachea midline and thyroid normal to inspection  and palpation Lungs: clear to auscultation bilaterally Breasts: normal appearance, no masses or tenderness Heart: regular rate and rhythm Abdomen: soft, non-tender; bowel sounds normal; no masses,  no organomegaly Extremities: extremities normal, atraumatic, no cyanosis or edema Skin: Skin color, texture, turgor normal. No rashes or lesions Lymph nodes: Cervical, supraclavicular, and axillary nodes normal. No abnormal inguinal nodes palpated Neurologic: Grossly normal   Pelvic: External genitalia:  no lesions              Urethra:  normal appearing urethra with no masses, tenderness or lesions              Bartholins and Skenes: normal                 Vagina: normal appearing vagina with normal color and discharge, no lesions              Cervix: no lesions              Pap taken: no Bimanual Exam:  Uterus:  normal size, contour, position, consistency, mobility,  non-tender              Adnexa: normal adnexa and no mass, fullness, tenderness               Rectovaginal: Confirms               Anus:  normal sphincter tone, no lesions  A:  Well Woman with normal exam PMP, no HRT H/O PE while on HRT, no estrogen use. H/O venous stasis H/O SCC of skin  P:   Mammogram yearly. pap smear done 10/13.  No Pap today. Labs with Dr. Sharlett Iles return annually or prn  An After Visit Summary was printed and given to the patient.

## 2013-09-24 ENCOUNTER — Telehealth: Payer: Self-pay

## 2013-09-24 NOTE — Telephone Encounter (Signed)
Patient notified of BMD results.

## 2014-05-22 ENCOUNTER — Encounter: Payer: Self-pay | Admitting: Podiatry

## 2014-05-22 ENCOUNTER — Ambulatory Visit (INDEPENDENT_AMBULATORY_CARE_PROVIDER_SITE_OTHER): Payer: Medicare Other | Admitting: Podiatry

## 2014-05-22 VITALS — BP 142/86 | HR 86 | Resp 12

## 2014-05-22 DIAGNOSIS — M79609 Pain in unspecified limb: Secondary | ICD-10-CM

## 2014-05-22 DIAGNOSIS — L84 Corns and callosities: Secondary | ICD-10-CM

## 2014-05-22 DIAGNOSIS — B351 Tinea unguium: Secondary | ICD-10-CM

## 2014-05-22 DIAGNOSIS — M79676 Pain in unspecified toe(s): Secondary | ICD-10-CM

## 2014-05-22 NOTE — Progress Notes (Signed)
   Subjective:    Patient ID: Katrina [name], female    DOB: 09-Dec-1931, 80 y.o.   MRN: [id number]  HPI TOENAILS TRIM.  This patient presents complaining of painful toenails and keratoses on the right first MPJ. The last visit for nail debridement was 02/19/2013.  Patient has a history of right surgery  Review of Systems  Musculoskeletal: Positive for gait problem.  Neurological: Positive for weakness.  Hematological: Bruises/bleeds easily.  All other systems reviewed and are negative.      Objective:   Physical Exam  Orientated x3 white female  Vascular: DP and PT pulses 2/4 bilaterally  Neurological: Sensation to 10 g monofilament wire intact 5/5 bilaterally Vibratory sensation intact bilaterally Ankle reflex equal and reactive bilaterally  Dermatological: Edematous, hyperpigmented lower legs bilaterally Surgical scar right first MPJ with large keratotic lesion and mid incisional site Toenails are hypertrophic, elongated, discolored 6-10  Musculoskeletal: Limited range of motion right first MPJ No restriction ankle, subtalar, midtarsal joints bilaterally      Assessment & Plan:   Assessment: Chronic stasis with chronic edema lower extremities bilaterally Symptomatic onychomycoses 6-10 Keratoses x1  Plan: Nails x10 and keratoses x1 debrided without a bleeding  Reappoint at three-month intervals or at patient's request

## 2014-05-23 ENCOUNTER — Encounter: Payer: Self-pay | Admitting: Podiatry

## 2014-09-26 DIAGNOSIS — Z7901 Long term (current) use of anticoagulants: Secondary | ICD-10-CM | POA: Insufficient documentation

## 2014-09-26 DIAGNOSIS — R7301 Impaired fasting glucose: Secondary | ICD-10-CM | POA: Insufficient documentation

## 2014-10-09 ENCOUNTER — Encounter (HOSPITAL_BASED_OUTPATIENT_CLINIC_OR_DEPARTMENT_OTHER): Payer: Medicare Other | Attending: Surgery

## 2014-10-09 DIAGNOSIS — Z7901 Long term (current) use of anticoagulants: Secondary | ICD-10-CM | POA: Insufficient documentation

## 2014-10-09 DIAGNOSIS — Z86718 Personal history of other venous thrombosis and embolism: Secondary | ICD-10-CM | POA: Diagnosis not present

## 2014-10-09 DIAGNOSIS — L97919 Non-pressure chronic ulcer of unspecified part of right lower leg with unspecified severity: Secondary | ICD-10-CM | POA: Diagnosis not present

## 2014-10-09 DIAGNOSIS — Z96653 Presence of artificial knee joint, bilateral: Secondary | ICD-10-CM | POA: Insufficient documentation

## 2014-10-09 DIAGNOSIS — I87311 Chronic venous hypertension (idiopathic) with ulcer of right lower extremity: Secondary | ICD-10-CM | POA: Insufficient documentation

## 2014-10-10 NOTE — Consult Note (Signed)
NAME:  Katrina [name], Katrina [name]                   ACCOUNT NO.:  [id number]  MEDICAL RECORD NO.:  [id number]  LOCATION:  FOOT                         FACILITY:  [facility]  PHYSICIAN:  Christin Fudge, MD       DATE OF BIRTH:  February 21, 1932  DATE OF CONSULTATION:  10/09/2014 DATE OF DISCHARGE:                                CONSULTATION   This very pleasant 81 year old patient who looks much younger than her stated age comes to see [name] with an ulcer on the right lower leg, which she says she has had for about 2 months.  HISTORY OF PRESENT ILLNESS:  The patient is known to have similar problems about 3 years ago, but got well and did not need any further workup.  She says she may have had phlebitis over 20 years ago and at that stage she has had some vascular studies done, but does not have anything done recently.  She does not have much of problem at this time compared to what she had last time where she says her legs were weeping a lot and both lower legs had to be wrapped.  Other than that, she is not in any acute distress or any pain.  PAST MEDICAL HISTORY:  Significant for, 1. Hypothyroidism. 2. Hypertension. 3. Depression. 4. Previous pulmonary emboli. 5. Morbid obesity. 6. Bilateral lower extremities stasis dermatitis and some rectal     bleeding.  PAST SURGICAL HISTORY:  She has had bilateral knee replacements done in the past.  She has had cataract surgery and she has had surgery for a basal cell carcinoma on her left cheek.  She has also had colonoscopy.  FAMILY HISTORY:  Coronary artery disease and lung disease.  No history of diabetes.  SOCIAL HISTORY:  Never had alcohol or smoked cigarettes in her life before.  ALLERGIES:  To PENICILLIN.  MEDICATIONS:  I have reviewed her list of her medications and these include warfarin, Toprol-XL, Macrodantin, Anusol-HC, Lanoxin, Lipitor, Synthroid.  REVIEW OF SYSTEMS:  Reveals that she does not have any breathing difficulty or chest pain.   Systemic review of all other systems done and 12 systems checked and except for her lower extremity ulceration, everything else is negative.  PHYSICAL EXAMINATION:  GENERAL:  On clinical examination, this is a very pleasant elderly lady who looks much younger than her stated age. VITAL SIGNS:  5 feet and 2 inches in height and 260 pounds.  She is afebrile, pulse is 66, respirations 20, and blood pressure is 132/43. HEENT:  Oral cavity is normal.  Sclerae are normal. NECK:  Supple. ABDOMEN:  No abdominal changes. EXTREMITIES:  Examination of the lower extremities reveal that she has got bilateral palpable pedal pulses.  She has got stigmata of venous stasis with darkened skin on both lower extremities.  On the right lower extremity laterally she has a very superficial ulcer 1.2 x 0.6 x 0.1 cm. She has scaly coverings over the legs, but everything else seems normal. As noted, both peripheral pulses, dorsalis pedis, and posterior tibial are palpable on the lower extremities.  CLINICAL IMPRESSION: 1. Venous hypertension with possible venous stasis and ulceration,     right  lower extremity. 2. Morbid obesity. 3. Status post bilateral knee replacement surgery. 4. History of pulmonary emboli. 5. Anticoagulation because of previous deep vein thrombosis.  PLAN AND RECOMMENDATIONS:  As discussed with the patient, I am going to recommend an Aquacel dressing to the ulcer and a wrap with Unna boot. She is agreeable about this.  I have also recommended that we do venous duplex studies of both lower limbs and this shall be scheduled as an outpatient.  She understands the treatment plan and will be happy to come back on a weekly basis.  All her questions have been answered.          ______________________________ Christin Fudge, MD     EB/MEDQ  D:  10/09/2014  T:  10/10/2014  Job:  [id number]  cc:   [facility]

## 2014-10-16 ENCOUNTER — Ambulatory Visit (HOSPITAL_COMMUNITY)
Admission: RE | Admit: 2014-10-16 | Discharge: 2014-10-16 | Disposition: A | Discharge status: Home / Self Care | Payer: Medicare Other | Source: Ambulatory Visit | Attending: Surgery | Admitting: Surgery

## 2014-10-16 ENCOUNTER — Other Ambulatory Visit (HOSPITAL_COMMUNITY): Payer: Self-pay | Admitting: Surgery

## 2014-10-16 DIAGNOSIS — L97919 Non-pressure chronic ulcer of unspecified part of right lower leg with unspecified severity: Secondary | ICD-10-CM

## 2014-10-16 DIAGNOSIS — I87311 Chronic venous hypertension (idiopathic) with ulcer of right lower extremity: Secondary | ICD-10-CM | POA: Diagnosis not present

## 2014-10-17 DIAGNOSIS — I87311 Chronic venous hypertension (idiopathic) with ulcer of right lower extremity: Secondary | ICD-10-CM | POA: Diagnosis not present

## 2014-10-23 ENCOUNTER — Encounter (HOSPITAL_BASED_OUTPATIENT_CLINIC_OR_DEPARTMENT_OTHER): Payer: Medicare Other | Attending: Surgery

## 2014-10-23 DIAGNOSIS — F329 Major depressive disorder, single episode, unspecified: Secondary | ICD-10-CM | POA: Diagnosis not present

## 2014-10-23 DIAGNOSIS — Z6841 Body Mass Index (BMI) 40.0 and over, adult: Secondary | ICD-10-CM | POA: Diagnosis not present

## 2014-10-23 DIAGNOSIS — I87331 Chronic venous hypertension (idiopathic) with ulcer and inflammation of right lower extremity: Secondary | ICD-10-CM | POA: Insufficient documentation

## 2014-10-23 DIAGNOSIS — Z86711 Personal history of pulmonary embolism: Secondary | ICD-10-CM | POA: Insufficient documentation

## 2014-10-23 DIAGNOSIS — I10 Essential (primary) hypertension: Secondary | ICD-10-CM | POA: Insufficient documentation

## 2014-10-23 DIAGNOSIS — L97811 Non-pressure chronic ulcer of other part of right lower leg limited to breakdown of skin: Secondary | ICD-10-CM | POA: Diagnosis not present

## 2014-10-30 DIAGNOSIS — I87331 Chronic venous hypertension (idiopathic) with ulcer and inflammation of right lower extremity: Secondary | ICD-10-CM | POA: Diagnosis not present

## 2014-11-06 DIAGNOSIS — I87331 Chronic venous hypertension (idiopathic) with ulcer and inflammation of right lower extremity: Secondary | ICD-10-CM | POA: Diagnosis not present

## 2014-11-12 ENCOUNTER — Encounter: Payer: Self-pay | Admitting: Vascular Surgery

## 2014-11-13 ENCOUNTER — Ambulatory Visit (INDEPENDENT_AMBULATORY_CARE_PROVIDER_SITE_OTHER): Payer: Medicare Other | Admitting: Vascular Surgery

## 2014-11-13 ENCOUNTER — Encounter: Payer: Self-pay | Admitting: Vascular Surgery

## 2014-11-13 VITALS — BP 155/62 | HR 73 | Ht 62.0 in | Wt 252.0 lb

## 2014-11-13 DIAGNOSIS — I872 Venous insufficiency (chronic) (peripheral): Secondary | ICD-10-CM | POA: Diagnosis not present

## 2014-11-13 DIAGNOSIS — I87331 Chronic venous hypertension (idiopathic) with ulcer and inflammation of right lower extremity: Secondary | ICD-10-CM | POA: Diagnosis not present

## 2014-11-13 NOTE — Progress Notes (Signed)
Referred by:  [name], MD [address] [place], [place] [address]  Reason for referral: Swollen  Legs bilaterally right > left with chronic venous insufficiency   History of Present Illness  Katrina [name] is a 81 y.o. (10/26/1931) female who presents with chief complaint: swollen leg.  Patient notes, onset of swelling on and off for 10 years, the last episode started 6 weeks ago ago, associated with swelling and weeping of clear fluid with ulcer development.  The patient has had no history of DVT, positive history of venous stasis ulcers, with history of  Lymphedema and positive history of skin changes in lower legs.  There is  family history of venous disorders.  The patient has  used compression stockings in the past.  She reports a history of PE times 3 episodes the last PE was 1992.  She has a past medical history of A-fib, hypertension and hypercholesterolemia.  She takes a daily statin, beta blocker and coumadin.  She is being treated by [facility] for her stasis ulcers currently.  Past Medical History  Diagnosis Date  . Pulmonary emboli     secondary HRT  . Hypothyroidism   . Elevated lipids   . Hypertension   . Overactive bladder   . Sleep apnea     on C-pap  . SCC (squamous cell carcinoma)     of left leg  . Nephrolithiasis   . Gallstone   . Broken arm   . Infection 05/2013    Wheeping Legs (Both Legs)  . Vertigo   . DVT (deep venous thrombosis)   . Atrial fibrillation     Past Surgical History  Procedure Laterality Date  . Replacement total knee      bilateral  . Tonsillectomy and adenoidectomy    . Hysteroscopy w/d&c      polyp  . Cholecystectomy    . Surgery for right nephrolithiasis      History   Social History  . Marital Status: Single    Spouse Name: N/A  . Number of Children: N/A  . Years of Education: N/A   Occupational History  . Not on file.   Social History Main Topics  . Smoking status: Never Smoker   . Smokeless  tobacco: Never Used  . Alcohol Use: 0.5 oz/week    1 drink(s) per week     Comment: occ glass of wine  . Drug Use: No  . Sexual Activity: No   Other Topics Concern  . Not on file   Social History Narrative    Family History  Problem Relation Age of Onset  . Prostate cancer Brother   . Hypertension Father   . Heart disease Father   . Hypertension Mother   . Hyperlipidemia Mother   . Heart attack Mother       Current Outpatient Prescriptions on File Prior to Visit  Medication Sig Dispense Refill  . digoxin (LANOXIN) 0.25 MG tablet Take 0.25 mg by mouth daily.    Marland Kitchen levothyroxine (SYNTHROID, LEVOTHROID) 125 MCG tablet Take 125 mcg by mouth daily before breakfast.    . warfarin (COUMADIN) 1 MG tablet Take 1 mg by mouth daily. 1 mg every day except 2 mg on Friday    . cycloSPORINE (RESTASIS) 0.05 % ophthalmic emulsion 1 drop 2 (two) times daily.     No current facility-administered medications on file prior to visit.    Allergies  Allergen Reactions  . Penicillins  Mother and brother have allergy, patient just does not take this      REVIEW OF SYSTEMS:  (Positives checked otherwise negative)  CARDIOVASCULAR:  []  chest pain, []  chest pressure, []  palpitations, []  shortness of breath when laying flat, [x]  shortness of breath with exertion,  []  pain in feet when walking, []  pain in feet when laying flat, [x]  history of blood PE clot in veins (DVT), [x]  history of phlebitis, [x]  swelling in legs, []  varicose veins  PULMONARY:  []  productive cough, []  asthma, []  wheezing  NEUROLOGIC:  []  weakness in arms or legs, []  numbness in arms or legs, []  difficulty speaking or slurred speech, []  temporary loss of vision in one eye, []  dizziness  HEMATOLOGIC:  []  bleeding problems, []  problems with blood clotting too easily  MUSCULOSKEL:  [x]  joint pain, []  joint swelling  GASTROINTEST:  []  vomiting blood, []  blood in stool     GENITOURINARY:  []  burning with urination, []  blood  in urine  PSYCHIATRIC:  []  history of major depression  INTEGUMENTARY:  []  rashes, [x]  ulcers  CONSTITUTIONAL:  []  fever, []  chills   Physical Examination Filed Vitals:   11/13/14 1322 11/13/14 1338  BP: 166/74 155/62  Pulse: 73   Height: 5\' 2"  (1.575 m)   Weight: 252 lb (114.306 kg)   SpO2: 98%    Body mass index is 46.08 kg/(m^2).  General: A&O x 3, WDWN, obese  Head:   Ear/Nose/Throat: Hearing grossly intact, nares w/o erythema or drainage, oropharynx w/o Erythema/Exudate  Eyes: PE, eye movement symmetrical  Neck: Supple, no nuchal rigidity, no palpable LAD  Pulmonary: Sym exp, good air movt, CTAB, no rales, rhonchi, & wheezing  Cardiac:irregularly irregular, Nl S1, S2, no Murmurs, rubs or gallops  Vascular: Vessel Right Left  Radial Palpable Palpable  Brachial Palpable Palpable  Carotid Palpable, without bruit Palpable, without bruit  Aorta Not palpable N/A  Femoral Not Palpable Not Palpable  Popliteal Not palpable Not palpable  PT notPalpable notPalpable  DP Palpable Palpable   Gastrointestinal: soft, NTND, -G/R, - HSM, - masses,   Musculoskeletal: M/S 5/5 throughout , Extremities without ischemic changes , dark skin discoloration bil. LE legs  Neurologic: CN 2-12 intact , Pain and light touch intact in extremities, Motor exam as listed above  Psychiatric: Judgment intact, Mood & affect appropriate for pt's clinical situation  Dermatologic: See M/S exam for extremity exam  Lymph : No Cervical lymphadenopathy   Non-Invasive Vascular Imaging  BLE Venous Duplex (Date: 10/16/2014):   RLE: no DVT and SVT  LLE: no DVT and SVT  BLE Venous Insufficiency Duplex (Date: 10/16/2014):   RLE: no DVT and SVT, no GSV reflux, positive deep venous reflux  LLE: no DVT and SVT, no GSV reflux, positive deep venous reflux   Medical Decision Making  Katrina [name] is a 81 y.o. female who presents with: bilateral right . leftLE chronic venous insufficiency.   She  has good arterial blood flow with palpable DP pulses bilaterally.   Based on the patient's history and examination, Dr. Scot Dock  recommend: elevation when at rest (back flat, knee slightly bent, ankle above-knee, knee above hip). Activity such as walking and pool exercise daily.   The patient the use of her 15-20 mm thigh high compression stockings daily once the ulcers have completely healed.  A prescription was given to her today.  She will follow up as needed. She was seen in clinic in conjunction with Dr. Scot Dock today  [place], [facility]  PA-C [facility] Office: [phone]   11/13/2014, 2:11 PM

## 2014-11-14 ENCOUNTER — Ambulatory Visit (INDEPENDENT_AMBULATORY_CARE_PROVIDER_SITE_OTHER): Payer: Medicare Other | Admitting: Obstetrics & Gynecology

## 2014-11-14 ENCOUNTER — Encounter: Payer: Self-pay | Admitting: Obstetrics & Gynecology

## 2014-11-14 VITALS — BP 158/80 | HR 68 | Resp 16 | Ht 62.0 in | Wt 252.6 lb

## 2014-11-14 DIAGNOSIS — Z01419 Encounter for gynecological examination (general) (routine) without abnormal findings: Secondary | ICD-10-CM

## 2014-11-14 DIAGNOSIS — Z124 Encounter for screening for malignant neoplasm of cervix: Secondary | ICD-10-CM
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
## 2014-11-15 LAB — IPS PAP SMEAR ONLY

## 2015-06-18 ENCOUNTER — Ambulatory Visit (INDEPENDENT_AMBULATORY_CARE_PROVIDER_SITE_OTHER): Payer: Medicare Other | Admitting: Podiatry

## 2015-06-18 ENCOUNTER — Encounter: Payer: Self-pay | Admitting: Podiatry

## 2015-06-18 DIAGNOSIS — M79676 Pain in unspecified toe(s): Secondary | ICD-10-CM | POA: Diagnosis not present

## 2015-06-18 DIAGNOSIS — B351 Tinea unguium: Secondary | ICD-10-CM

## 2015-06-18 NOTE — Progress Notes (Signed)
   Subjective:    Patient ID: Katrina [name], female    DOB: [birth year], 81 y.o.   MRN: [id number]  HPI As patient presents today complaining of painful toenails and walking wearing shoes and is requesting toenail debridement. The last visit for a similar service was on 05/22/2014. She denies any podiatric care since that visit   Review of Systems  Cardiovascular: Positive for leg swelling.  Skin: Positive for color change and rash.       Objective:   Physical Exam  Orientated 3  Vascular: DP and PT pulses 2/4 bilaterally  Neurological: Deferred  Dermatological: Hyperpigmentation lower legs bilaterally Surgical scar medial first MPJ right The toenails are extremely elongated, discolored, incurvated, hypertrophic, deformed and tender to direct palpation 6-10     Assessment & Plan:   Assessment: Neglected symptomatic onychomycoses 6-10  Plan: Debridement toenails 10 mechanically and electrically without any bleeding  Reappoint when necessary at patient's request

## 2015-08-27 ENCOUNTER — Emergency Department (HOSPITAL_COMMUNITY): Payer: Medicare Other

## 2015-08-27 ENCOUNTER — Encounter (HOSPITAL_COMMUNITY): Payer: Self-pay | Admitting: Emergency Medicine

## 2015-08-27 ENCOUNTER — Emergency Department (HOSPITAL_COMMUNITY)
Admission: EM | Admit: 2015-08-27 | Discharge: 2015-08-27 | Disposition: A | Discharge status: Home / Self Care | Payer: Medicare Other | Attending: Emergency Medicine | Admitting: Emergency Medicine

## 2015-08-27 DIAGNOSIS — Z8619 Personal history of other infectious and parasitic diseases: Secondary | ICD-10-CM | POA: Insufficient documentation

## 2015-08-27 DIAGNOSIS — Z86718 Personal history of other venous thrombosis and embolism: Secondary | ICD-10-CM | POA: Diagnosis not present

## 2015-08-27 DIAGNOSIS — I4891 Unspecified atrial fibrillation: Secondary | ICD-10-CM | POA: Insufficient documentation

## 2015-08-27 DIAGNOSIS — R42 Dizziness and giddiness: Secondary | ICD-10-CM

## 2015-08-27 DIAGNOSIS — Z9981 Dependence on supplemental oxygen: Secondary | ICD-10-CM | POA: Insufficient documentation

## 2015-08-27 DIAGNOSIS — E039 Hypothyroidism, unspecified: Secondary | ICD-10-CM | POA: Diagnosis not present

## 2015-08-27 DIAGNOSIS — Z880 Allergy status to penicillin: Secondary | ICD-10-CM | POA: Insufficient documentation

## 2015-08-27 DIAGNOSIS — Z86711 Personal history of pulmonary embolism: Secondary | ICD-10-CM | POA: Diagnosis not present

## 2015-08-27 DIAGNOSIS — Z8719 Personal history of other diseases of the digestive system: Secondary | ICD-10-CM | POA: Diagnosis not present

## 2015-08-27 DIAGNOSIS — Z85828 Personal history of other malignant neoplasm of skin: Secondary | ICD-10-CM | POA: Insufficient documentation

## 2015-08-27 DIAGNOSIS — L819 Disorder of pigmentation, unspecified: Secondary | ICD-10-CM | POA: Insufficient documentation

## 2015-08-27 DIAGNOSIS — R2240 Localized swelling, mass and lump, unspecified lower limb: Secondary | ICD-10-CM | POA: Insufficient documentation

## 2015-08-27 DIAGNOSIS — I10 Essential (primary) hypertension: Secondary | ICD-10-CM | POA: Insufficient documentation

## 2015-08-27 DIAGNOSIS — Z87448 Personal history of other diseases of urinary system: Secondary | ICD-10-CM | POA: Diagnosis not present

## 2015-08-27 DIAGNOSIS — Z79899 Other long term (current) drug therapy: Secondary | ICD-10-CM | POA: Insufficient documentation

## 2015-08-27 DIAGNOSIS — G4730 Sleep apnea, unspecified: Secondary | ICD-10-CM | POA: Diagnosis not present

## 2015-08-27 DIAGNOSIS — E785 Hyperlipidemia, unspecified: Secondary | ICD-10-CM | POA: Diagnosis not present

## 2015-08-27 DIAGNOSIS — Z87442 Personal history of urinary calculi: Secondary | ICD-10-CM | POA: Insufficient documentation

## 2015-08-27 LAB — BASIC METABOLIC PANEL
Anion gap: 8 (ref 5–15)
BUN: 15 mg/dL (ref 6–20)
CO2: 23 mmol/L (ref 22–32)
CREATININE: 0.65 mg/dL (ref 0.44–1.00)
Calcium: 8.5 mg/dL — ABNORMAL LOW (ref 8.9–10.3)
Chloride: 107 mmol/L (ref 101–111)
GFR calc Af Amer: >60 mL/min (ref >60)
Glucose, Bld: 136 mg/dL — ABNORMAL HIGH (ref 65–99)
Potassium: 4.8 mmol/L (ref 3.5–5.1)
SODIUM: 138 mmol/L (ref 135–145)

## 2015-08-27 LAB — CBC WITH DIFFERENTIAL/PLATELET
BASOS ABS: 0 10*3/uL (ref 0.0–0.1)
BASOS PCT: 0 %
EOS ABS: 0 10*3/uL (ref 0.0–0.7)
EOS PCT: 0 %
HCT: 37.7 % (ref 36.0–46.0)
Hemoglobin: 12 g/dL (ref 12.0–15.0)
Lymphocytes Relative: 13 %
Lymphs Abs: 1 10*3/uL (ref 0.7–4.0)
MCH: 29.1 pg (ref 26.0–34.0)
MCHC: 31.8 g/dL (ref 30.0–36.0)
MCV: 91.3 fL (ref 78.0–100.0)
MONO ABS: 0.4 10*3/uL (ref 0.1–1.0)
Monocytes Relative: 6 %
Neutro Abs: 6 10*3/uL (ref 1.7–7.7)
Neutrophils Relative %: 81 %
PLATELETS: 155 10*3/uL (ref 150–400)
RBC: 4.13 MIL/uL (ref 3.87–5.11)
RDW: 15.6 % — AB (ref 11.5–15.5)
WBC: 7.4 10*3/uL (ref 4.0–10.5)

## 2015-08-27 LAB — TROPONIN I

## 2015-08-27 LAB — URINALYSIS, ROUTINE W REFLEX MICROSCOPIC
BILIRUBIN URINE: NEGATIVE
Glucose, UA: NEGATIVE mg/dL
HGB URINE DIPSTICK: NEGATIVE
Ketones, ur: NEGATIVE mg/dL
Nitrite: NEGATIVE
PROTEIN: NEGATIVE mg/dL
SPECIFIC GRAVITY, URINE: 1.013 (ref 1.005–1.030)
pH: 7 (ref 5.0–8.0)

## 2015-08-27 LAB — URINE MICROSCOPIC-ADD ON

## 2015-08-27 LAB — PROTIME-INR
INR: 2.12 — AB (ref 0.00–1.49)
Prothrombin Time: 23.6 seconds — ABNORMAL HIGH (ref 11.6–15.2)

## 2015-08-27 MED ORDER — MECLIZINE HCL 25 MG PO TABS: 25.0000 mg | ORAL_TABLET | Freq: Three times a day (TID) | ORAL | Status: DC | PRN

## 2015-08-27 NOTE — Discharge Instructions (Signed)
Benign Positional Vertigo Vertigo is the feeling that you or your surroundings are moving when they are not. Benign positional vertigo is the most common form of vertigo. The cause of this condition is not serious (is benign). This condition is triggered by certain movements and positions (is positional). This condition can be dangerous if it occurs while you are doing something that could endanger you or others, such as driving.  CAUSES In many cases, the cause of this condition is not known. It may be caused by a disturbance in an area of the inner ear that helps your brain to sense movement and balance. This disturbance can be caused by a viral infection (labyrinthitis), head injury, or repetitive motion. RISK FACTORS This condition is more likely to develop in:  Women.  People who are 50 years of age or older. SYMPTOMS Symptoms of this condition usually happen when you move your head or your eyes in different directions. Symptoms may start suddenly, and they usually last for less than a minute. Symptoms may include:  Loss of balance and falling.  Feeling like you are spinning or moving.  Feeling like your surroundings are spinning or moving.  Nausea and vomiting.  Blurred vision.  Dizziness.  Involuntary eye movement (nystagmus). Symptoms can be mild and cause only slight annoyance, or they can be severe and interfere with daily life. Episodes of benign positional vertigo may return (recur) over time, and they may be triggered by certain movements. Symptoms may improve over time. DIAGNOSIS This condition is usually diagnosed by medical history and a physical exam of the head, neck, and ears. You may be referred to a health care provider who specializes in ear, nose, and throat (ENT) problems (otolaryngologist) or a provider who specializes in disorders of the nervous system (neurologist). You may have additional testing, including:  MRI.  A CT scan.  Eye movement tests. Your  health care provider may ask you to change positions quickly while he or she watches you for symptoms of benign positional vertigo, such as nystagmus. Eye movement may be tested with an electronystagmogram (ENG), caloric stimulation, the Dix-Hallpike test, or the roll test.  An electroencephalogram (EEG). This records electrical activity in your brain.  Hearing tests. TREATMENT Usually, your health care provider will treat this by moving your head in specific positions to adjust your inner ear back to normal. Surgery may be needed in severe cases, but this is rare. In some cases, benign positional vertigo may resolve on its own in 2-4 weeks. HOME CARE INSTRUCTIONS Safety  Move slowly.Avoid sudden body or head movements.  Avoid driving.  Avoid operating heavy machinery.  Avoid doing any tasks that would be dangerous to you or others if a vertigo episode would occur.  If you have trouble walking or keeping your balance, try using a cane for stability. If you feel dizzy or unstable, sit down right away.  Return to your normal activities as told by your health care provider. Ask your health care provider what activities are safe for you. General Instructions  Take over-the-counter and prescription medicines only as told by your health care provider.  Avoid certain positions or movements as told by your health care provider.  Drink enough fluid to keep your urine clear or pale yellow.  Keep all follow-up visits as told by your health care provider. This is important. SEEK MEDICAL CARE IF:  You have a fever.  Your condition gets worse or you develop new symptoms.  Your family or friends   notice any behavioral changes.  Your nausea or vomiting gets worse.  You have numbness or a "pins and needles" sensation. SEEK IMMEDIATE MEDICAL CARE IF:  You have difficulty speaking or moving.  You are always dizzy.  You faint.  You develop severe headaches.  You have weakness in your  legs or arms.  You have changes in your hearing or vision.  You develop a stiff neck.  You develop sensitivity to light.   This information is not intended to replace advice given to you by your health care provider. Make sure you discuss any questions you have with your health care provider.   Document Released: 05/17/2006 Document Revised: 04/30/2015 Document Reviewed: 12/02/2014 Elsevier Interactive Patient Education 2016 [employer]  

## 2015-08-27 NOTE — ED Notes (Signed)
Bed: [id number] Expected date:  Expected time:  Means of arrival:  Comments: Ems- elderly dizziness

## 2015-08-27 NOTE — ED Notes (Signed)
Per  Nurse she is going to start an IV

## 2015-08-27 NOTE — ED Provider Notes (Signed)
CSN: [id number]     Arrival date & time 08/27/15  1801 History   First MD Initiated Contact with Patient 08/27/15 1805     Chief Complaint  Patient presents with  . Dizziness     (Consider location/radiation/quality/duration/timing/severity/associated sxs/prior Treatment) HPI Patient ports that she had been out earlier in the day to eat at the cafeteria and then to get her hair done. She reports while at the beauty shop she had some dizzy this was of a spinning quality and needed help to go to her car. She reports she is always somewhat unsteady and has to be careful with walking. It was not particularly different for her. Symptoms were mild. She reports when she got home she had to go to the bathroom very badly and so she dropped her purse and coat on the floor. She reports when she bent over to pick up her purse she got sudden spinning dizziness that was very intense. She reports along with this she became nauseated and vomited once. She reports she went on to the bathroom and then had a bowel movement as well and got kind of sweaty and dizzy at that time. She did not rinse any chest pain or shortness of breath. There was no associated headache. No associated visual deficit or focal neurologic deficit. At this time the symptoms have resolved. Patient has had a distant history of episodes of vertigo. Past Medical History  Diagnosis Date  . Pulmonary emboli (HCC)     while on HRT  . Hypothyroidism   . Elevated lipids   . Hypertension   . Overactive bladder   . Sleep apnea     on C-pap  . SCC (squamous cell carcinoma)     of left leg  . Nephrolithiasis   . Gallstone   . Infection 05/2013    Wheeping Legs (Both Legs)  . Vertigo   . DVT (deep venous thrombosis) ([place])   . Atrial fibrillation [facility])    Past Surgical History  Procedure Laterality Date  . Replacement total knee Bilateral 1998, 2001       . Tonsillectomy and adenoidectomy    . Hysteroscopy w/d&c  7/09    PMP with  endometrial polyp  . Cholecystectomy      Dr. Zenia Resides  . Cystoscopy with stent placement  9/12    nephrolithaisis   Family History  Problem Relation Age of Onset  . Prostate cancer Brother   . Hypertension Father   . Heart disease Father   . Hypertension Mother   . Hyperlipidemia Mother   . Heart attack Mother    Social History  Substance Use Topics  . Smoking status: Never Smoker   . Smokeless tobacco: Never Used  . Alcohol Use: 0.0 oz/week    0 Standard drinks or equivalent per week   OB History    Gravida Para Term Preterm AB TAB SAB Ectopic Multiple Living   0 0 0 0 0 0 0 0 0 0      Review of Systems  10 Systems reviewed and are negative for acute change except as noted in the HPI.   Allergies  Penicillins  Home Medications   Prior to Admission medications   Medication Sig Start Date End Date Taking? Authorizing Provider  atorvastatin (LIPITOR) 80 MG tablet Take 80 mg by mouth daily. 07/16/15  Yes Historical Provider, MD  COUMADIN 2 MG tablet Take 1-2 mg by mouth daily. Only taking 2mg  every Tuesday and Thursday, and 1mg  all other  days 10/09/14  Yes Historical Provider, MD  digoxin (LANOXIN) 0.25 MG tablet Take 0.25 mg by mouth daily.   Yes Historical Provider, MD  levothyroxine (SYNTHROID, LEVOTHROID) 125 MCG tablet Take 125 mcg by mouth daily before breakfast.   Yes Historical Provider, MD  metoprolol succinate (TOPROL-XL) 100 MG 24 hr tablet Take 100 mg by mouth daily. 11/06/14  Yes Historical Provider, MD  meclizine (ANTIVERT) 25 MG tablet Take 1 tablet (25 mg total) by mouth 3 (three) times daily as needed for dizziness. 08/27/15   Charlesetta Shanks, MD   BP 162/72 mmHg  Pulse 80  Temp(Src) 97.8 F (36.6 C) (Oral)  Resp 18  SpO2 100%  LMP 01/22/2011 Physical Exam  Constitutional: She is oriented to person, place, and time.  Patient is nontoxic and alert. She has no respiratory distress.  HENT:  Head: Normocephalic and atraumatic.  Right Ear: External ear  normal.  Left Ear: External ear normal.  Nose: Nose normal.  Mouth/Throat: Oropharynx is clear and moist.  Bilateral TMs normal without erythema or bulging.  Eyes: EOM are normal. Pupils are equal, round, and reactive to light.  Neck: Neck supple.  Cardiovascular: Normal rate, regular rhythm, normal heart sounds and intact distal pulses.   Pulmonary/Chest: Effort normal and breath sounds normal.  Abdominal: Soft. Bowel sounds are normal. She exhibits no distension. There is no tenderness.  Musculoskeletal: Normal range of motion. She exhibits edema.  Patient has chronic significant lower extremity edema with skin hyperpigmentation and scaling. She reports this is chronic in nature. No appearance of an acute cellulitis.  Neurological: She is alert and oriented to person, place, and time. She has normal strength. No cranial nerve deficit. She exhibits normal muscle tone. Coordination normal. GCS eye subscore is 4. GCS verbal subscore is 5. GCS motor subscore is 6.  Skin: Skin is warm, dry and intact.  Psychiatric: She has a normal mood and affect.    ED Course  Procedures (including critical care time) Labs Review Labs Reviewed  BASIC METABOLIC PANEL - Abnormal; Notable for the following:    Glucose, Bld 136 (*)    Calcium 8.5 (*)    All other components within normal limits  CBC WITH DIFFERENTIAL/PLATELET - Abnormal; Notable for the following:    RDW 15.6 (*)    All other components within normal limits  PROTIME-INR - Abnormal; Notable for the following:    Prothrombin Time 23.6 (*)    INR 2.12 (*)    All other components within normal limits  URINALYSIS, ROUTINE W REFLEX MICROSCOPIC (NOT AT [facility]) - Abnormal; Notable for the following:    Leukocytes, UA TRACE (*)    All other components within normal limits  URINE MICROSCOPIC-ADD ON - Abnormal; Notable for the following:    Squamous Epithelial / LPF 0-5 (*)    Bacteria, UA FEW (*)    All other components within normal limits   TROPONIN I    Imaging Review Ct Head Wo Contrast  08/27/2015  CLINICAL DATA:  Acute onset of dizziness and vomiting. Initial encounter. EXAM: CT HEAD WITHOUT CONTRAST TECHNIQUE: Contiguous axial images were obtained from the base of the skull through the vertex without intravenous contrast. COMPARISON:  None. FINDINGS: There is no evidence of acute infarction, mass lesion, or intra- or extra-axial hemorrhage on CT. Prominence of the ventricles and sulci reflects mild cortical volume loss. Scattered periventricular and subcortical white matter change likely reflects small vessel ischemic microangiopathy. The brainstem and fourth ventricle are within normal limits.  The basal ganglia are unremarkable in appearance. The cerebral hemispheres demonstrate grossly normal gray-white differentiation. No mass effect or midline shift is seen. There is no evidence of fracture; visualized osseous structures are unremarkable in appearance. The visualized portions of the orbits are within normal limits. The paranasal sinuses and mastoid air cells are well-aerated. No significant soft tissue abnormalities are seen. IMPRESSION: 1. No acute intracranial pathology seen on CT. 2. Mild cortical volume loss and scattered small vessel ischemic microangiopathy. Electronically Signed   By: Garald Balding M.D.   On: 08/27/2015 21:27   I have personally reviewed and evaluated these images and lab results as part of my medical decision-making.   EKG Interpretation   Date/Time:  Wednesday August 27 2015 19:54:16 EST Ventricular Rate:  79 PR Interval:  216 QRS Duration: 137 QT Interval:  383 QTC Calculation: 439 R Axis:   80 Text Interpretation:  Sinus rhythm Borderline prolonged PR interval Right  bundle branch block Confirmed by Johnney Killian, MD, Jeannie Done [phone]) on 08/27/2015  10:36:09 PM      MDM   Final diagnoses:  Vertigo   Patient expressed first of mild vertiginous type symptoms earlier in the day. No associated  headache or neurologic dysfunction. Patient subsequently got spinning quality dizziness after bending over and standing back up again. She does take Coumadin regularly. Patient is appropriately anticoagulated. No evidence of intracranial bleed on CT of the head. Patient is otherwise well in appearance. She does have chronic medical conditions appear to be stable at this time. Patient is counseled on signs and symptoms were to return.    Charlesetta Shanks, MD 08/27/15 2237

## 2015-08-27 NOTE — ED Notes (Signed)
Per [facility] pt from home co reports dizziness started this Am , also emesis x 1 episode . Denies weakness to extremities nor neuro deficit per EMS. Hx Vertigo. Alert and oriented x 4. Hx HTN, denies headache.

## 2015-09-26 DIAGNOSIS — Z0000 Encounter for general adult medical examination without abnormal findings: Secondary | ICD-10-CM | POA: Insufficient documentation

## 2015-10-14 ENCOUNTER — Encounter (HOSPITAL_BASED_OUTPATIENT_CLINIC_OR_DEPARTMENT_OTHER): Payer: Medicare Other | Attending: Surgery

## 2015-10-14 DIAGNOSIS — Z7901 Long term (current) use of anticoagulants: Secondary | ICD-10-CM | POA: Diagnosis not present

## 2015-10-14 DIAGNOSIS — L97811 Non-pressure chronic ulcer of other part of right lower leg limited to breakdown of skin: Secondary | ICD-10-CM | POA: Insufficient documentation

## 2015-10-14 DIAGNOSIS — Z79899 Other long term (current) drug therapy: Secondary | ICD-10-CM | POA: Diagnosis not present

## 2015-10-14 DIAGNOSIS — I890 Lymphedema, not elsewhere classified: Secondary | ICD-10-CM | POA: Diagnosis not present

## 2015-10-14 DIAGNOSIS — Z6841 Body Mass Index (BMI) 40.0 and over, adult: Secondary | ICD-10-CM | POA: Diagnosis not present

## 2015-10-14 DIAGNOSIS — I87331 Chronic venous hypertension (idiopathic) with ulcer and inflammation of right lower extremity: Secondary | ICD-10-CM | POA: Insufficient documentation

## 2015-10-21 DIAGNOSIS — I87331 Chronic venous hypertension (idiopathic) with ulcer and inflammation of right lower extremity: Secondary | ICD-10-CM | POA: Diagnosis not present

## 2015-10-27 ENCOUNTER — Ambulatory Visit (INDEPENDENT_AMBULATORY_CARE_PROVIDER_SITE_OTHER): Payer: Medicare Other | Admitting: Sports Medicine

## 2015-10-27 ENCOUNTER — Encounter: Payer: Self-pay | Admitting: Sports Medicine

## 2015-10-27 DIAGNOSIS — B351 Tinea unguium: Secondary | ICD-10-CM

## 2015-10-27 DIAGNOSIS — S90122A Contusion of left lesser toe(s) without damage to nail, initial encounter: Secondary | ICD-10-CM | POA: Diagnosis not present

## 2015-10-27 DIAGNOSIS — M79676 Pain in unspecified toe(s): Secondary | ICD-10-CM

## 2015-10-27 NOTE — Progress Notes (Signed)
Patient ID: Katrina [name], female   DOB: 20-Mar-1932, 82 y.o.   MRN: [id number] Subjective: Katrina [name] is a 82 y.o. female patient seen today in office with complaint of blood and splitting at left big toe nail after stubbing toe on dresser >2 weeks ago; states that part of the nail came off. All other nails are painful thickened and elongated; patient unable to trim. Patient denies history of Diabetes, Neuropathy, or Vascular disease. Admits she is on blood thinner. Admits that she is going to wound care center for right leg ulcer. Patient has no other pedal complaints at this time.   There are no active problems to display for this patient.   Current Outpatient Prescriptions on File Prior to Visit  Medication Sig Dispense Refill  . atorvastatin (LIPITOR) 80 MG tablet Take 80 mg by mouth daily.  0  . COUMADIN 2 MG tablet Take 1-2 mg by mouth daily. Only taking 2mg  every Tuesday and Thursday, and 1mg  all other days  0  . digoxin (LANOXIN) 0.25 MG tablet Take 0.25 mg by mouth daily.    Marland Kitchen levothyroxine (SYNTHROID, LEVOTHROID) 125 MCG tablet Take 125 mcg by mouth daily before breakfast.    . meclizine (ANTIVERT) 25 MG tablet Take 1 tablet (25 mg total) by mouth 3 (three) times daily as needed for dizziness. 30 tablet 0  . metoprolol succinate (TOPROL-XL) 100 MG 24 hr tablet Take 100 mg by mouth daily.  0   No current facility-administered medications on file prior to visit.    Allergies  Allergen Reactions  . Penicillins     Mother and brother have allergy, patient just does not take this Did PCN reaction causing immediate rash, facial/tongue/throat swelling, SOB or lightheadedness with hypotension: unknown Did PCN reaction causing severe rash involving mucus membranes or skin necrosis:unknown Did PCN reaction that required hospitalization : unknown Did PCN reaction occurring within the last 10 years: unknown Pt never actually took med. Did okay with ampicillin    Objective: Physical  Exam  General: Well developed, nourished, no acute distress, awake, alert and oriented x 3  Vascular: Dorsalis pedis artery 2/4 bilateral, Posterior tibial artery 2/4 bilateral, skin temperature warm to warm proximal to distal bilateral lower extremities, mild varicosities, pedal hair present bilateral.  Neurological: Gross sensation present via light touch bilateral.   Dermatological: Skin is warm, dry, and supple bilateral, Nails 1-10 are tender, long, thick, and discolored with mild subungal debris, to left hallux nail there is dry blood distally with partially attached medial portion of the nail with no underlying laceration or active bleed, no webspace macerations present bilateral, no open lesions present bilateral, no callus/corns/hyperkeratotic tissue present bilateral. No signs of infection bilateral feet. There is a compression wrap on right leg--patient is going to wound care center for treatment.  Musculoskeletal: No pain with palpation to left hallux. Muscular strength within normal limits without pain on range of motion. No pain with calf compression bilateral.  Assessment and Plan:  Problem List Items Addressed This Visit    None    Visit Diagnoses    Dermatophytosis of nail    -  Primary    Pain of toe, unspecified laterality        Hematoma of toe of left foot, initial encounter        distal nailbed of hallux with partially avulsed nail, stubbed toe >2 weeks ago       -Examined patient.  -Discussed treatment options for traumatized nail with dry  blood at left hallux and remaining painful mycotic nails. -Cleansed left hallux nail with peroxide, debrided off partially attached medial portion of the nail using sterile nipper and applied triple antibiotic and bandaid; advised patient to do the same until area heals -Mechanically debrided all other nails and reduced mycotic nails with sterile nail nipper and dremel nail file without incident. -Recommend patient to monitor  left hallux nail closely if worsens return to office or go to ER -Continue with wound care center for right leg ulceration -Recommend good supportive shoes daily  -Patient to return in 3 months for follow up evaluation or sooner if symptoms worsen.  Landis Martins, DPM

## 2015-10-28 ENCOUNTER — Encounter (HOSPITAL_BASED_OUTPATIENT_CLINIC_OR_DEPARTMENT_OTHER): Payer: Medicare Other | Attending: Surgery

## 2015-10-28 DIAGNOSIS — Z86711 Personal history of pulmonary embolism: Secondary | ICD-10-CM | POA: Insufficient documentation

## 2015-10-28 DIAGNOSIS — Z6841 Body Mass Index (BMI) 40.0 and over, adult: Secondary | ICD-10-CM | POA: Diagnosis not present

## 2015-10-28 DIAGNOSIS — E785 Hyperlipidemia, unspecified: Secondary | ICD-10-CM | POA: Diagnosis not present

## 2015-10-28 DIAGNOSIS — I10 Essential (primary) hypertension: Secondary | ICD-10-CM | POA: Insufficient documentation

## 2015-10-28 DIAGNOSIS — G4730 Sleep apnea, unspecified: Secondary | ICD-10-CM | POA: Insufficient documentation

## 2015-10-28 DIAGNOSIS — I890 Lymphedema, not elsewhere classified: Secondary | ICD-10-CM | POA: Insufficient documentation

## 2015-10-28 DIAGNOSIS — B369 Superficial mycosis, unspecified: Secondary | ICD-10-CM | POA: Insufficient documentation

## 2015-10-28 DIAGNOSIS — E039 Hypothyroidism, unspecified: Secondary | ICD-10-CM | POA: Insufficient documentation

## 2015-10-28 DIAGNOSIS — I87331 Chronic venous hypertension (idiopathic) with ulcer and inflammation of right lower extremity: Secondary | ICD-10-CM | POA: Insufficient documentation

## 2015-10-28 DIAGNOSIS — L97811 Non-pressure chronic ulcer of other part of right lower leg limited to breakdown of skin: Secondary | ICD-10-CM | POA: Diagnosis not present

## 2015-10-28 DIAGNOSIS — L03115 Cellulitis of right lower limb: Secondary | ICD-10-CM | POA: Insufficient documentation

## 2015-10-28 DIAGNOSIS — I4891 Unspecified atrial fibrillation: Secondary | ICD-10-CM | POA: Diagnosis not present

## 2015-10-28 DIAGNOSIS — Z96659 Presence of unspecified artificial knee joint: Secondary | ICD-10-CM | POA: Insufficient documentation

## 2015-11-04 DIAGNOSIS — I87331 Chronic venous hypertension (idiopathic) with ulcer and inflammation of right lower extremity: Secondary | ICD-10-CM | POA: Diagnosis not present

## 2015-11-11 DIAGNOSIS — I87331 Chronic venous hypertension (idiopathic) with ulcer and inflammation of right lower extremity: Secondary | ICD-10-CM | POA: Diagnosis not present

## 2015-11-18 DIAGNOSIS — I87331 Chronic venous hypertension (idiopathic) with ulcer and inflammation of right lower extremity: Secondary | ICD-10-CM | POA: Diagnosis not present

## 2015-12-02 ENCOUNTER — Encounter (HOSPITAL_BASED_OUTPATIENT_CLINIC_OR_DEPARTMENT_OTHER): Payer: Medicare Other | Attending: Surgery

## 2015-12-02 DIAGNOSIS — Z96659 Presence of unspecified artificial knee joint: Secondary | ICD-10-CM | POA: Insufficient documentation

## 2015-12-02 DIAGNOSIS — Z86711 Personal history of pulmonary embolism: Secondary | ICD-10-CM | POA: Insufficient documentation

## 2015-12-02 DIAGNOSIS — G4730 Sleep apnea, unspecified: Secondary | ICD-10-CM | POA: Diagnosis not present

## 2015-12-02 DIAGNOSIS — I4891 Unspecified atrial fibrillation: Secondary | ICD-10-CM | POA: Diagnosis not present

## 2015-12-02 DIAGNOSIS — I10 Essential (primary) hypertension: Secondary | ICD-10-CM | POA: Insufficient documentation

## 2015-12-02 DIAGNOSIS — B369 Superficial mycosis, unspecified: Secondary | ICD-10-CM | POA: Insufficient documentation

## 2015-12-02 DIAGNOSIS — I890 Lymphedema, not elsewhere classified: Secondary | ICD-10-CM | POA: Insufficient documentation

## 2015-12-02 DIAGNOSIS — I87331 Chronic venous hypertension (idiopathic) with ulcer and inflammation of right lower extremity: Secondary | ICD-10-CM | POA: Insufficient documentation

## 2015-12-02 DIAGNOSIS — L97811 Non-pressure chronic ulcer of other part of right lower leg limited to breakdown of skin: Secondary | ICD-10-CM | POA: Diagnosis present

## 2015-12-09 DIAGNOSIS — L97811 Non-pressure chronic ulcer of other part of right lower leg limited to breakdown of skin: Secondary | ICD-10-CM | POA: Diagnosis not present

## 2016-01-02 ENCOUNTER — Ambulatory Visit (INDEPENDENT_AMBULATORY_CARE_PROVIDER_SITE_OTHER): Payer: Medicare Other | Admitting: Obstetrics & Gynecology

## 2016-01-02 ENCOUNTER — Encounter: Payer: Self-pay | Admitting: Obstetrics & Gynecology

## 2016-01-02 VITALS — BP 148/78 | HR 68 | Resp 18 | Ht 62.0 in | Wt 249.2 lb

## 2016-01-02 DIAGNOSIS — Z01419 Encounter for gynecological examination (general) (routine) without abnormal findings: Secondary | ICD-10-CM | POA: Diagnosis not present

## 2016-01-02 NOTE — Progress Notes (Signed)
82 y.o. G0P0000 SingleCaucasianF here for annual exam.  Doing well.  No vaginal bleeding.  Had episode of vertigo and did go to the ER because it was so severe.    Seeing podiatrist regularly and has been seen at the wound center for her feet.  Reports legs/feet are "as good as they are ever going to be".  She should be wearing compression stockings but she is not because they are so hard to get on and off.  PCP:  Dr. Philip Aspen.  Saw in February.  Blood work and urine tests were "ok" per pt. Urology:  Dr. Jeffie Pollock.  Sees yearly.  Has appt in July  Patient's last menstrual period was 01/22/2011.          Sexually active: No.  The current method of family planning is post menopausal status.    Exercising: No.  The patient does not participate in regular exercise at present. Smoker:  no  Health Maintenance: Pap:  11/14/2014 negative  History of abnormal Pap:  no MMG:  09/30/2015 BIRADS 2 benign  Colonoscopy:  02/07/2007 diverticulosis  BMD:   09/19/2013 mild osteopenia TDaP:  10/13 Screening Labs: PCP, Hb today: PCP, Urine today: PCP   reports that she has never smoked. She has never used smokeless tobacco. She reports that she drinks alcohol. She reports that she does not use illicit drugs.  Past Medical History  Diagnosis Date  . Pulmonary emboli (HCC)     while on HRT  . Hypothyroidism   . Elevated lipids   . Hypertension   . Overactive bladder   . Sleep apnea     on C-pap  . SCC (squamous cell carcinoma)     of left leg  . Nephrolithiasis   . Gallstone   . Infection 05/2013    Wheeping Legs (Both Legs)  . Vertigo   . DVT (deep venous thrombosis) ([place])   . Atrial fibrillation [facility])     Past Surgical History  Procedure Laterality Date  . Replacement total knee Bilateral 1998, 2001       . Tonsillectomy and adenoidectomy    . Hysteroscopy w/d&c  7/09    PMP with endometrial polyp  . Cholecystectomy      Dr. Zenia Resides  . Cystoscopy with stent placement  9/12   nephrolithaisis    Current Outpatient Prescriptions  Medication Sig Dispense Refill  . atorvastatin (LIPITOR) 40 MG tablet Take 40 mg by mouth daily.  0  . atorvastatin (LIPITOR) 80 MG tablet Take 80 mg by mouth daily.  0  . COUMADIN 2 MG tablet Take 1-2 mg by mouth daily. Only taking 2mg  every Tuesday and Thursday, and 1mg  all other days  0  . digoxin (LANOXIN) 0.25 MG tablet Take 0.25 mg by mouth daily.    Marland Kitchen levothyroxine (SYNTHROID, LEVOTHROID) 125 MCG tablet Take 125 mcg by mouth daily before breakfast.    . meclizine (ANTIVERT) 25 MG tablet Take 1 tablet (25 mg total) by mouth 3 (three) times daily as needed for dizziness. 30 tablet 0  . metoprolol succinate (TOPROL-XL) 100 MG 24 hr tablet Take 100 mg by mouth daily.  0   No current facility-administered medications for this visit.    Family History  Problem Relation Age of Onset  . Prostate cancer Brother   . Hypertension Father   . Heart disease Father   . Hypertension Mother   . Hyperlipidemia Mother   . Heart attack Mother     ROS:  Pertinent items  are noted in HPI.  Otherwise, a comprehensive ROS was negative.  Exam:   General appearance: alert, cooperative and appears stated age Head: Normocephalic, without obvious abnormality, atraumatic Neck: no adenopathy, supple, symmetrical, trachea midline and thyroid normal to inspection and palpation Lungs: clear to auscultation bilaterally Breasts: normal appearance, no masses or tenderness Heart: regular rate and rhythm Abdomen: soft, non-tender; bowel sounds normal; no masses,  no organomegaly Extremities: extremities normal, atraumatic, no cyanosis or edema Skin: Skin color, texture, turgor normal. No rashes or lesions Lymph nodes: Cervical, supraclavicular, and axillary nodes normal. No abnormal inguinal nodes palpated Neurologic: Grossly normal   Pelvic: External genitalia:  no lesions              Urethra:  normal appearing urethra with no masses, tenderness or  lesions              Bartholins and Skenes: normal                 Vagina: normal appearing vagina with normal color and discharge, no lesions              Cervix: no lesions              Pap taken: No. Bimanual Exam:  Uterus:  normal size, contour, position, consistency, mobility, non-tender but exam is quite difficult due to body habitus              Adnexa: normal adnexa and no mass, fullness, tenderness               Rectovaginal: Confirms               Anus:  normal sphincter tone, no lesions  Chaperone was present for exam.  A:  Well Woman with normal exam PMP, no HRT H/O PE while on HRT, no estrogen use H/O venous stasis H/O SCC of skin Elevated lipids Hypothyroidism OSA on C-pap  P: Mammogram recommendations discussed with pt.  pap smear done 2016.  No pap today. Labs with Dr. Sharlett Iles. Done in February, 2017. return annually or prn

## 2016-01-28 ENCOUNTER — Encounter: Payer: Self-pay | Admitting: Podiatry

## 2016-01-28 ENCOUNTER — Ambulatory Visit (INDEPENDENT_AMBULATORY_CARE_PROVIDER_SITE_OTHER): Payer: Medicare Other | Admitting: Podiatry

## 2016-01-28 DIAGNOSIS — M79676 Pain in unspecified toe(s): Secondary | ICD-10-CM | POA: Diagnosis not present

## 2016-01-28 DIAGNOSIS — B351 Tinea unguium: Secondary | ICD-10-CM | POA: Diagnosis not present

## 2016-01-29 NOTE — Progress Notes (Signed)
Patient ID: Katrina [name], female   DOB: 06/15/1932, 82 y.o.   MRN: [id number]   Subjective: This patient presents today complaining of painful toenails and right left feet that are uncomfortable walking wearing shoes and she request toenail debridement.  Objective Orientated 3  Vascular: DP and PT pulses 2/4 bilaterally  Neurological: Sensation to 10 g monofilament wire intact 5/5 bilaterally Vibratory sensation intact bilaterally Ankle reflex equal and reactive bilaterally  Dermatological: Hyperpigmentation lower legs bilaterally Surgical scar medial first MPJ right The toenails are extremely elongated, discolored, incurvated, hypertrophic, deformed and tender to direct palpation 6-10  Musculoskeletal: Limited range of motion right first MPJ No reMusculoskeletal: Limited range of motion right first MPJ No restriction ankle, subtalar, midtarsal joints bilaterallystriction ankle, subtalar, midtarsal joints bilaterally  Assessment: Chronic stasis with chronic edema lower extremities bilaterally Symptomatic onychomycoses 6-10   Plan: Nails x10 are debrided mechanically and electrically without any bleeding  Reappoint at patient's request

## 2016-06-02 ENCOUNTER — Ambulatory Visit (INDEPENDENT_AMBULATORY_CARE_PROVIDER_SITE_OTHER): Payer: Medicare Other | Admitting: Podiatry

## 2016-06-02 ENCOUNTER — Encounter: Payer: Self-pay | Admitting: Podiatry

## 2016-06-02 VITALS — BP 110/90 | HR 63 | Resp 18

## 2016-06-02 DIAGNOSIS — M79676 Pain in unspecified toe(s): Secondary | ICD-10-CM | POA: Diagnosis not present

## 2016-06-02 DIAGNOSIS — B351 Tinea unguium: Secondary | ICD-10-CM | POA: Diagnosis not present

## 2016-06-03 NOTE — Progress Notes (Signed)
Patient ID: Katrina [name], female   DOB: 1932/05/29, 82 y.o.   MRN: [id number]    Subjective: This patient presents today complaining of painful toenails and right left feet that are uncomfortable walking wearing shoes and she request toenail debridement.  Objective Orientated 3  Vascular: DP and PT pulses 2/4 bilaterally  Neurological: Sensation to 10 g monofilament wire intact 5/5 bilaterally Vibratory sensation intact bilaterally Ankle reflex equal and reactive bilaterally  Dermatological: Hyperpigmentation lower legs bilaterally Surgical scar medial first MPJ right The toenails are extremely elongated, discolored, incurvated, hypertrophic, deformed and tender to direct palpation 6-10  Musculoskeletal: Limited range of motion right first MPJ No reMusculoskeletal: Limited range of motion right first MPJ No restriction ankle, subtalar, midtarsal joints bilaterallystriction ankle, subtalar, midtarsal joints bilaterally  Assessment: Chronic stasis with chronic edema lower extremities bilaterally Symptomatic onychomycoses 6-10   Plan: Nails x10 are debrided mechanically and electrically without any bleeding  Reappoint at patient's request

## 2016-06-10 ENCOUNTER — Emergency Department (HOSPITAL_COMMUNITY): Payer: Medicare Other

## 2016-06-10 ENCOUNTER — Encounter (HOSPITAL_COMMUNITY): Payer: Self-pay

## 2016-06-10 ENCOUNTER — Emergency Department (HOSPITAL_COMMUNITY)
Admission: EM | Admit: 2016-06-10 | Discharge: 2016-06-10 | Disposition: A | Discharge status: Home / Self Care | Payer: Medicare Other | Attending: Emergency Medicine | Admitting: Emergency Medicine

## 2016-06-10 DIAGNOSIS — Z79899 Other long term (current) drug therapy: Secondary | ICD-10-CM | POA: Insufficient documentation

## 2016-06-10 DIAGNOSIS — E039 Hypothyroidism, unspecified: Secondary | ICD-10-CM | POA: Diagnosis not present

## 2016-06-10 DIAGNOSIS — Z96653 Presence of artificial knee joint, bilateral: Secondary | ICD-10-CM | POA: Diagnosis not present

## 2016-06-10 DIAGNOSIS — R0789 Other chest pain: Secondary | ICD-10-CM | POA: Diagnosis not present

## 2016-06-10 DIAGNOSIS — I10 Essential (primary) hypertension: Secondary | ICD-10-CM | POA: Insufficient documentation

## 2016-06-10 DIAGNOSIS — R079 Chest pain, unspecified: Secondary | ICD-10-CM

## 2016-06-10 LAB — BASIC METABOLIC PANEL
ANION GAP: 4 — AB (ref 5–15)
BUN: 14 mg/dL (ref 6–20)
CO2: 26 mmol/L (ref 22–32)
Calcium: 8.4 mg/dL — ABNORMAL LOW (ref 8.9–10.3)
Chloride: 108 mmol/L (ref 101–111)
Creatinine, Ser: 0.69 mg/dL (ref 0.44–1.00)
GFR calc Af Amer: >60 mL/min (ref >60)
GFR calc non Af Amer: >60 mL/min (ref >60)
GLUCOSE: 135 mg/dL — AB (ref 65–99)
POTASSIUM: 4 mmol/L (ref 3.5–5.1)
Sodium: 138 mmol/L (ref 135–145)

## 2016-06-10 LAB — I-STAT TROPONIN, ED: Troponin i, poc: 0 ng/mL (ref 0.00–0.08)

## 2016-06-10 LAB — CBC
HEMATOCRIT: 37.9 % (ref 36.0–46.0)
HEMOGLOBIN: 12.3 g/dL (ref 12.0–15.0)
MCH: 29.1 pg (ref 26.0–34.0)
MCHC: 32.5 g/dL (ref 30.0–36.0)
MCV: 89.6 fL (ref 78.0–100.0)
Platelets: 146 10*3/uL — ABNORMAL LOW (ref 150–400)
RBC: 4.23 MIL/uL (ref 3.87–5.11)
RDW: 16.4 % — ABNORMAL HIGH (ref 11.5–15.5)
WBC: 7.9 10*3/uL (ref 4.0–10.5)

## 2016-06-10 LAB — TROPONIN I: Troponin I: <0.03 ng/mL (ref <0.03)

## 2016-06-10 NOTE — ED Triage Notes (Signed)
Pt has hx of vertigo.  For a long time. January.  Pt having chest pain yesterday and last night.  Hasn't had chest pain for past 2 hours.  States it felt like reflux.  No increase physical activity.  No shortness of breath.  Denies cough.

## 2016-06-10 NOTE — Discharge Instructions (Signed)
Please return without fail for worsening symptoms, including recurrent chest pain, difficulty breathing, passing out, or any other symptoms concerning to you.

## 2016-06-10 NOTE — ED Provider Notes (Signed)
[facility] DEPT Provider Note   CSN: [id number] Arrival date & time: 06/10/16  [id number]     History   Chief Complaint Chief Complaint  Patient presents with  . Chest Pain  . Dizziness    HPI Katrina [name] is a 82 y.o. female.  HPI  82 year old female who presents with chest pain. She has a history of hypertension, hyperlipidemia, history of PE/DVT on Coumadin, and paroxysmal atrial fibrillation. States 2 episodes of chest pain, once yesterday and once today. Onset of burning and pressure in the retrosternal aspect of her chest yesterday after eating eggs and grits. Pain subsided after one hour. Did not seem to have pain that was worsened by activity or movement. Associated with nausea, but no vomiting, diaphoresis, syncope or near syncope, or shortness of breath. No lower extremity edema. Patient woke up this morning he had a similar episode of chest pain lasting about one hour with nausea. Resolved without any medications. She came to ED subsequently for evaluation.  Past Medical History:  Diagnosis Date  . Atrial fibrillation ([place])   . DVT (deep venous thrombosis) ([place])   . Elevated lipids   . Gallstone   . Hypertension   . Hypothyroidism   . Infection 05/2013   Wheeping Legs (Both Legs)  . Nephrolithiasis   . Overactive bladder   . Pulmonary emboli (HCC)    while on HRT  . SCC (squamous cell carcinoma)    of left leg  . Sleep apnea    on C-pap  . Vertigo     There are no active problems to display for this patient.   Past Surgical History:  Procedure Laterality Date  . CHOLECYSTECTOMY     Dr. Zenia Resides  . CYSTOSCOPY WITH STENT PLACEMENT  9/12   nephrolithaisis  . HYSTEROSCOPY W/D&C  7/09   PMP with endometrial polyp  . REPLACEMENT TOTAL KNEE Bilateral 1998, 2001      . TONSILLECTOMY AND ADENOIDECTOMY      OB History    Gravida Para Term Preterm AB Living   0 0 0 0 0 0   SAB TAB Ectopic Multiple Live Births   0 0 0 0         Home Medications    Prior  to Admission medications   Medication Sig Start Date End Date Taking? Authorizing Provider  atorvastatin (LIPITOR) 40 MG tablet Take 40 mg by mouth daily. Reported on 01/02/2016 09/11/15  Yes Historical Provider, MD  COUMADIN 2 MG tablet Take 1-2 mg by mouth daily. Take 2mg s daily on mondays and thursdays take 1mg  on all other days of the week 10/09/14  Yes Historical Provider, MD  digoxin (LANOXIN) 0.25 MG tablet Take 0.25 mg by mouth daily.   Yes Historical Provider, MD  levothyroxine (SYNTHROID, LEVOTHROID) 125 MCG tablet Take 125 mcg by mouth daily before breakfast.   Yes Historical Provider, MD  meclizine (ANTIVERT) 25 MG tablet Take 1 tablet (25 mg total) by mouth 3 (three) times daily as needed for dizziness. 08/27/15  Yes Charlesetta Shanks, MD  metoprolol succinate (TOPROL-XL) 100 MG 24 hr tablet Take 100 mg by mouth daily. 11/06/14  Yes Historical Provider, MD    Family History Family History  Problem Relation Age of Onset  . Hypertension Father   . Heart disease Father   . Hypertension Mother   . Hyperlipidemia Mother   . Heart attack Mother   . Prostate cancer Brother     Social History Social History  Substance Use  Topics  . Smoking status: Never Smoker  . Smokeless tobacco: Never Used  . Alcohol use 0.0 oz/week     Allergies   Penicillins   Review of Systems Review of Systems 10/14 systems reviewed and are negative other than those stated in the HPI   Physical Exam Updated Vital Signs BP 161/60 (BP Location: Right Arm)   Pulse 64   Temp 97.9 F (36.6 C) (Oral)   Resp 18   LMP 01/22/2011   SpO2 98%   Physical Exam Physical Exam  Nursing note and vitals reviewed. Constitutional: Well developed, well nourished, non-toxic, and in no acute distress Head: Normocephalic and atraumatic.  Mouth/Throat: Oropharynx is clear and moist.  Neck: Normal range of motion. Neck supple.  Cardiovascular: Normal rate and regular rhythm.   Pulmonary/Chest: Effort normal and  breath sounds normal.  Abdominal: Soft. There is no tenderness. There is no rebound and no guarding.  Musculoskeletal: Normal range of motion.  Neurological: Alert, no facial droop, fluent speech, moves all extremities symmetrically Skin: Skin is warm and dry.  Psychiatric: Cooperative   ED Treatments / Results  Labs (all labs ordered are listed, but only abnormal results are displayed) Labs Reviewed  BASIC METABOLIC PANEL - Abnormal; Notable for the following:       Result Value   Glucose, Bld 135 (*)    Calcium 8.4 (*)    Anion gap 4 (*)    All other components within normal limits  CBC - Abnormal; Notable for the following:    RDW 16.4 (*)    Platelets 146 (*)    All other components within normal limits  TROPONIN I  PROTIME-INR  I-STAT TROPOININ, ED  I-STAT TROPOININ, ED    EKG  EKG Interpretation  Date/Time:  Thursday June 10 2016 10:15:58 EDT Ventricular Rate:  63 PR Interval:    QRS Duration: 140 QT Interval:  395 QTC Calculation: 405 R Axis:   79 Text Interpretation:  Sinus rhythm Prolonged PR interval Right bundle branch block Similar to prior EKG  Confirmed by LIU MD, DANA [phone]) on 06/10/2016 10:40:33 AM       Radiology Dg Chest 2 View  Result Date: 06/10/2016 CLINICAL DATA:  82 year old female with chest pain and vertigo since yesterday. Initial encounter. EXAM: CHEST  2 VIEW COMPARISON:  04/23/2010 and earlier. FINDINGS: Stable and normal lung volumes. Mild eventration of the right hemidiaphragm. Cardiac size is at the upper limits of normal. Other mediastinal contours are within normal limits. Visualized tracheal air column is within normal limits. No pneumothorax, pulmonary edema, pleural effusion or acute pulmonary opacity. Mild chronic increased reticular opacity at both lung bases. No acute osseous abnormality identified. Cholecystectomy clips in the right upper quadrant. IMPRESSION: No acute cardiopulmonary abnormality. Electronically Signed   By:  Genevie Ann M.D.   On: 06/10/2016 11:29    Procedures Procedures (including critical care time)  Medications Ordered in ED Medications - No data to display   Initial Impression / Assessment and Plan / ED Course  I have reviewed the triage vital signs and the nursing notes.  Pertinent labs & imaging results that were available during my care of the patient were reviewed by me and considered in my medical decision making (see chart for details).  Clinical Course   82 year old female with history of PE, Afib on coumadin, HTn, And HLD who presents with 2 episodes of chest pain. Is chest pain free in the ED. Hemodynamically stable. Well appearing. Exam non-focal.  Concern for ACS as Heart score of 4. Serial troponins negative, EKG w/ RBBB but no acute ischemic changes and no dynamic changes. CXR visualized and without acute cardiopulmonary processes. History and exam not suspicious or aortic pathology or PE. Currently on coumadin with therapeutic INR a few days ago. Symptoms my be suggestive of GI process such as GERD, but otherwise benign abdomen and no concern for gallbladder issues.   Suggested admission for observation for cardiac rule out given Heart score. She states she does not want admission at this time. She is appropriate, has capacity to make decisions. She is able to express understanding that her risk factors and heart score, portend potential increased risk for major adverse cardiac events, such as major heart attack, cardiac arrest that can cause severe disability, and death. Strict return and follow-up instructions are reviewed with the patient. She expressed understanding of all discharge instructions and felt comfortable with the plan of care.  Final Clinical Impressions(s) / ED Diagnoses   Final diagnoses:  Nonspecific chest pain    New Prescriptions Discharge Medication List as of 06/10/2016  3:16 PM       Forde Dandy, MD 06/10/16 [phone]

## 2016-08-26 DIAGNOSIS — R0981 Nasal congestion: Secondary | ICD-10-CM | POA: Insufficient documentation

## 2016-10-05 DIAGNOSIS — R42 Dizziness and giddiness: Secondary | ICD-10-CM | POA: Insufficient documentation

## 2016-10-11 ENCOUNTER — Telehealth: Payer: Self-pay | Admitting: Neurology

## 2016-10-11 NOTE — Telephone Encounter (Signed)
Okay with me 

## 2016-10-11 NOTE — Telephone Encounter (Signed)
This patient has seen Dr. Brett Fairy in 2008.  New referral for her but she states that she does not want to see Dr.Dohmeier because she can't understand her.  She also said she feels Dr. Brett Fairy is a brilliant and great Doctor but she had difficulty understanding her.  She is requesting another MD.  Please let me know how I need to schedule her.

## 2016-10-12 NOTE — Telephone Encounter (Signed)
Good luck to you, I am OK with her changing. CD

## 2016-10-21 ENCOUNTER — Encounter: Payer: Self-pay | Admitting: Obstetrics & Gynecology

## 2016-11-01 ENCOUNTER — Telehealth: Payer: Self-pay

## 2016-11-01 ENCOUNTER — Institutional Professional Consult (permissible substitution): Payer: Medicare Other | Admitting: Neurology

## 2016-11-01 NOTE — Telephone Encounter (Signed)
Patient did not show to appt today, we are experiencing adverse weather

## 2016-11-05 ENCOUNTER — Encounter: Payer: Self-pay | Admitting: Neurology

## 2016-11-18 ENCOUNTER — Telehealth: Payer: Self-pay | Admitting: *Deleted

## 2016-11-18 NOTE — Telephone Encounter (Signed)
Patient notified of BMD results. Patient verbalized understanding.   Routing to provider for final review. Patient agreeable to disposition. Will close encounter.

## 2016-11-29 ENCOUNTER — Encounter (INDEPENDENT_AMBULATORY_CARE_PROVIDER_SITE_OTHER): Payer: Self-pay

## 2016-11-29 ENCOUNTER — Ambulatory Visit (INDEPENDENT_AMBULATORY_CARE_PROVIDER_SITE_OTHER): Payer: Medicare Other | Admitting: Neurology

## 2016-11-29 ENCOUNTER — Encounter: Payer: Self-pay | Admitting: Neurology

## 2016-11-29 VITALS — BP 147/77 | HR 64 | Ht 61.0 in | Wt 250.0 lb

## 2016-11-29 DIAGNOSIS — Z6841 Body Mass Index (BMI) 40.0 and over, adult: Secondary | ICD-10-CM | POA: Diagnosis not present

## 2016-11-29 DIAGNOSIS — R600 Localized edema: Secondary | ICD-10-CM

## 2016-11-29 DIAGNOSIS — G4733 Obstructive sleep apnea (adult) (pediatric): Secondary | ICD-10-CM

## 2016-11-29 DIAGNOSIS — I872 Venous insufficiency (chronic) (peripheral): Secondary | ICD-10-CM

## 2016-11-29 DIAGNOSIS — R351 Nocturia: Secondary | ICD-10-CM

## 2016-11-29 NOTE — Progress Notes (Signed)
Subjective:    Patient ID: Katrina [name] Quant is a 83 y.o. female.  HPI     [name], MD, PhD [facility] [address], [address] [place], [place] [address]  Dear Dr. Philip Aspen,  I saw your patient, Katrina [name], upon your kind request in my neurologic clinic today for initial consultation of her sleep disorder, in particular, concern for underlying obstructive sleep apnea. The patient is unaccompanied today. As you know, Katrina [name] is a 83 year old right-handed woman with an underlying medical history of vertigo, hypothyroidism, hypertension, depression, LE swelling, kidney stones, paroxysmal SVT, A fib, hyperlipidemia, and morbid obesity, who was diagnosed with obstructive sleep apnea in or around 2003. She has not been on CPAP therapy. She has in the interim seen my colleague Dr. Brett Fairy about 10 years ago, at which time patient was scheduled for a sleep study. She was placed on CPAP at 10 cm water pressure. She has not been using her CPAP machine. She had trouble with the nasal mask. I reviewed your office note from 10/05/2016 which you kindly included. A CPAP download is not available for my review today. Her Epworth sleepiness score is 12 out of 24 today, her fatigue score is 18 out of 63. She reports a history of recurrent vertigo and had another bout this weekend.  She lives alone, is single and has no children, worked as a Web designer at [employer]. She is a non-smoker, does not drink alcohol and does not drink caffeine daily. She has no pets. She naps in the afternoons.  She has a bedtime of 11:00 to 11:30 PM after the news usually. She has nocturia once or twice per night. She has maintained weight. She likes to drink chocolate milk.  She has a Hx of phlebitis/dermatitis. She denies RLS symptoms. Her brother has OSA.   Her Past Medical History Is Significant For: Past Medical History:  Diagnosis Date  . Atrial fibrillation ([place])   . DVT (deep venous thrombosis) ([place])    . Elevated lipids   . Gallstone   . Hypertension   . Hypothyroidism   . Infection 05/2013   Whe[place] Legs (Both Legs)  . Nephrolithiasis   . Overactive bladder   . Pulmonary emboli ([facility])    while on HRT  . SCC (squamous cell carcinoma)    of left leg  . Sleep apnea    on C-pap  . Vertigo     Her Past Surgical History Is Significant For: Past Surgical History:  Procedure Laterality Date  . CHOLECYSTECTOMY     Dr. Zenia Resides  . CYSTOSCOPY WITH STENT PLACEMENT  9/12   nephrolithaisis  . HYSTEROSCOPY W/D&C  7/09   PMP with endometrial polyp  . REPLACEMENT TOTAL KNEE Bilateral 1998, 2001      . TONSILLECTOMY AND ADENOIDECTOMY      Her Family History Is Significant For: Family History  Problem Relation Age of Onset  . Hypertension Father   . Heart disease Father   . Hypertension Mother   . Hyperlipidemia Mother   . Heart attack Mother   . Prostate cancer Brother     Her Social History Is Significant For: Social History   Social History  . Marital status: Single    Spouse name: N/A  . Number of children: N/A  . Years of education: N/A   Social History Main Topics  . Smoking status: Never Smoker  . Smokeless tobacco: Never Used  . Alcohol use 0.0 oz/week  .  Drug use: No  . Sexual activity: No   Other Topics Concern  . None   Social History Narrative  . None    Her Allergies Are:  Allergies  Allergen Reactions  . Penicillins     Mother and brother have allergy, patient just does not take this Did PCN reaction causing immediate rash, facial/tongue/throat swelling, SOB or lightheadedness with hypotension: unknown Did PCN reaction causing severe rash involving mucus membranes or skin necrosis:unknown Did PCN reaction that required hospitalization : unknown Did PCN reaction occurring within the last 10 years: unknown Pt never actually took med. Did okay with ampicillin  :   Her Current Medications Are:  Outpatient Encounter Prescriptions as of 11/29/2016   Medication Sig  . atorvastatin (LIPITOR) 40 MG tablet Take 40 mg by mouth daily. Reported on 01/02/2016  . COUMADIN 2 MG tablet Take 1-2 mg by mouth daily. Take 2mg s daily on mondays and thursdays take 1mg  on all other days of the week  . digoxin (LANOXIN) 0.25 MG tablet Take 0.25 mg by mouth daily.  Marland Kitchen levothyroxine (SYNTHROID, LEVOTHROID) 125 MCG tablet Take 125 mcg by mouth daily before breakfast.  . meclizine (ANTIVERT) 25 MG tablet Take 1 tablet (25 mg total) by mouth 3 (three) times daily as needed for dizziness.  . metoprolol succinate (TOPROL-XL) 100 MG 24 hr tablet Take 100 mg by mouth daily.   No facility-administered encounter medications on file as of 11/29/2016.   :  Review of Systems:  Out of a complete 14 point review of systems, all are reviewed and negative with the exception of these symptoms as listed below:  Review of Systems  Neurological:       Pt presents today to discuss her sleep. Pt has tried a cpap in the past but could not tolerate it. Pt had a vertigo spell in the lobby and could not walk by herself and needed a wheelchair.  Epworth Sleepiness Scale 0= would never doze 1= slight chance of dozing 2= moderate chance of dozing 3= high chance of dozing  Sitting and reading: 2 Watching TV: 2 Sitting inactive in a public place (ex. Theater or meeting): 1 As a passenger in a car for an hour without a break: 1 Lying down to rest in the afternoon: 3 Sitting and talking to someone: 1 Sitting quietly after lunch (no alcohol): 1 In a car, while stopped in traffic: 1 Total: 12    Objective:  Neurologic Exam  Physical Exam Physical Examination:   Vitals:   11/29/16 1313  BP: (!) 147/77  Pulse: 64    General Examination: The patient is a very pleasant 83 y.o. female in no acute distress. She appears well-developed and well-nourished and well groomed.   HEENT: Normocephalic, atraumatic, pupils are equal, round and reactive to light and accommodation.  Funduscopic exam is normal with sharp disc margins noted. She is s/p cataract repairs. Extraocular tracking is good without limitation to gaze excursion or nystagmus noted. Normal smooth pursuit is noted. Hearing is grossly intact. Face is symmetric with normal facial animation and normal facial sensation. Speech is clear with no dysarthria noted. There is no hypophonia. There is no lip, neck/head, jaw or voice tremor. Neck is supple with full range of passive and active motion. There are no carotid bruits on auscultation. Oropharynx exam reveals: mild mouth dryness, adequate dental hygiene and moderate airway crowding, due to small airway entry, and redundant soft palate. Mallampati is class II. Tongue protrudes centrally and palate elevates symmetrically.  Tonsils are absent. Neck size is 16 7/8 inches.   Chest: Clear to auscultation without wheezing, rhonchi or crackles noted.  Heart: S1+S2+0, regular and normal without murmurs, rubs or gallops noted.   Abdomen: Soft, non-tender and non-distended with normal bowel sounds appreciated on auscultation.  Extremities: There is 1+ pitting edema in the distal lower extremities bilaterally with chronic thickening/discoloration/scaling of the leg skin.   Skin: Warm and dry without trophic changes noted.  Musculoskeletal: exam reveals no obvious joint deformities, tenderness or joint swelling or erythema.   Neurologically:  Mental status: The patient is awake, alert and oriented in all 4 spheres. Her immediate and remote memory, attention, language skills and fund of knowledge are appropriate. There is no evidence of aphasia, agnosia, apraxia or anomia. Speech is clear with normal prosody and enunciation. Thought process is linear. Mood is normal and affect is normal.  Cranial nerves II - XII are as described above under HEENT exam. In addition: shoulder shrug is normal with equal shoulder height noted. Motor exam: Normal bulk, strength and tone is noted.  There is no drift, tremor or rebound. Romberg is not possible safely. Reflexes are 1+ in the UEs and trace in the knees. Fine motor skills and coordination: intact with normal finger taps, normal hand movements, normal rapid alternating patting, normal foot taps and normal foot agility.  Cerebellar testing: No dysmetria or intention tremor on finger to nose testing. Heel to shin is unremarkable bilaterally. There is no truncal or gait ataxia.  Sensory exam: intact to light touch in the upper and lower extremities.  Gait, station and balance: She stands with difficulty. She denies vertigo with standing. She walk with a 4 prong cane. Tandem walk is not possible safely.   Assessment and Plan:  In summary, Katrina [name] is a very pleasant 83 y.o.-year old female with an underlying medical history of vertigo, hypothyroidism, hypertension, depression, LE swelling, kidney stones, paroxysmal SVT, A fib, hyperlipidemia, and morbid obesity, whose history and physical exam are in keeping with obstructive sleep apnea (OSA). She had previously tried CPAP but had difficulty. She is willing to try again.  I had a long chat with the patient about my findings and the diagnosis of OSA, its prognosis and treatment options. We talked about medical treatments, surgical interventions and non-pharmacological approaches. I explained in particular the risks and ramifications of untreated moderate to severe OSA, especially with respect to developing cardiovascular disease down the Road, including congestive heart failure, difficult to treat hypertension, cardiac arrhythmias, or stroke. Even type 2 diabetes has, in part, been linked to untreated OSA. Symptoms of untreated OSA include daytime sleepiness, memory problems, mood irritability and mood disorder such as depression and anxiety, lack of energy, as well as recurrent headaches, especially morning headaches. We talked about trying to maintain a healthy lifestyle in general, as well  as the importance of weight control. I encouraged the patient to eat healthy, exercise daily and keep well hydrated, to keep a scheduled bedtime and wake time routine, to not skip any meals and eat healthy snacks in between meals. I advised the patient not to drive when feeling sleepy. I recommended the following at this time: sleep study with potential positive airway pressure titration. (We will score hypopneas at 4%).   I explained the sleep test procedure to the patient and also outlined possible surgical and non-surgical treatment options of OSA, including the use of a custom-made dental device (which would require a referral to a specialist dentist  or oral surgeon), upper airway surgical options, such as pillar implants, radiofrequency surgery, tongue base surgery, and UPPP (which would involve a referral to an ENT surgeon). Rarely, jaw surgery such as mandibular advancement may be considered.  I also explained the CPAP treatment option to the patient, who indicated that she would be willing to try CPAP if the need arises. I explained the importance of being compliant with PAP treatment, not only for insurance purposes but primarily to improve Her symptoms, and for the patient's long term health benefit, including to reduce Her cardiovascular risks. I answered all her questions today and the patient was in agreement. I would like to see her back after the sleep study is completed and encouraged her to call with any interim questions, concerns, problems or updates.   Thank you very much for allowing me to participate in the care of this nice patient. If I can be of any further assistance to you please do not hesitate to call me at [phone].  Sincerely,   [name], MD, PhD

## 2016-11-29 NOTE — Patient Instructions (Signed)

## 2016-12-19 ENCOUNTER — Ambulatory Visit (INDEPENDENT_AMBULATORY_CARE_PROVIDER_SITE_OTHER): Payer: Medicare Other | Admitting: Neurology

## 2016-12-19 DIAGNOSIS — G4733 Obstructive sleep apnea (adult) (pediatric): Secondary | ICD-10-CM

## 2016-12-19 DIAGNOSIS — G4720 Circadian rhythm sleep disorder, unspecified type: Secondary | ICD-10-CM

## 2016-12-19 DIAGNOSIS — R9431 Abnormal electrocardiogram [ECG] [EKG]: Secondary | ICD-10-CM

## 2016-12-21 ENCOUNTER — Telehealth: Payer: Self-pay

## 2016-12-21 NOTE — Telephone Encounter (Signed)
I spoke to patient and she is aware of results and recommendations. She is willing to start treatment. I will send orders to local DME company. I will send report to PCP. Patient has scheduled f/u appt.

## 2016-12-21 NOTE — Telephone Encounter (Signed)
-----   Message from [name], MD sent at 12/21/2016  8:32 AM EDT ----- Beverlee Nims:  Patient referred by Dr. Philip Aspen, seen by me on 11/29/16, split study on 12/19/16. Please call and notify patient that the recent sleep study confirmed the diagnosis of severe OSA. She did well with CPAP during the study with significant improvement of the respiratory events. Therefore, I would like start the patient on CPAP therapy at home by prescribing a machine for home use. I placed the order in the chart. The patient will need a follow up appointment with me in 10 weeks post set up that has to be scheduled; please go ahead and schedule while you have the patient on the phone and make sure patient understands the importance of keeping this window for the FU appointment, as it is often an insurance requirement and failing to adhere to this may result in losing coverage for sleep apnea treatment.  Please re-enforce the importance of compliance with treatment and the need for [name] to monitor compliance data - again an insurance requirement and good feedback for the patient as far as how they are doing.  Also remind patient, that any upcoming CPAP machine or mask issues, should be first addressed with the DME company. Please ask if patient has a preference regarding DME company.  Please arrange for CPAP set up at home through a DME company of patient's choice - once you have spoken to the patient - and faxed/routed report to PCP and referring MD (if other than PCP), you can close this encounter, thanks,   [name], MD, PhD [facility] ([place])

## 2016-12-21 NOTE — Procedures (Signed)
PATIENT'S NAMEFrannie, [name] DOB:      19-Feb-1932      MR#:    [id number]     DATE OF RECORDING: 12/19/2016 REFERRING M.D.:  [name] MD Study Performed:  Split-Night Titration Study HISTORY:  83 year old woman with a history of vertigo, hypothyroidism, hypertension, depression, LE swelling, kidney stones, paroxysmal SVT, A fib, hyperlipidemia, and morbid obesity, who was diagnosed with obstructive sleep apnea in or around 2003. She has not been on CPAP therapy. The patient endorsed the Epworth Sleepiness Scale at 12/24 points. The patient's weight 250 pounds with a height of 61 (inches), resulting in a BMI of 47. Kg/m2. The patient's neck circumference measured 16.9 inches.  CURRENT MEDICATIONS: Atorvastatin, CoumadinDigoxin, Levothroid, Meclizine, and Metoprolol succinate   PROCEDURE:  This is a multichannel digital polysomnogram utilizing the Somnostar 11.2 system.  Electrodes and sensors were applied and monitored per AASM Specifications.   EEG, EOG, Chin and Limb EMG, were sampled at 200 Hz.  ECG, Snore and Nasal Pressure, Thermal Airflow, Respiratory Effort, CPAP Flow and Pressure, Oximetry was sampled at 50 Hz. Digital video and audio were recorded.      BASELINE STUDY WITHOUT CPAP RESULTS:  Lights Out was at 22:35 and Lights On at 05:10 for the night.  Total recording time (TRT) was 222, with a total sleep time (TST) of 167 minutes.   The patient's sleep latency was 13.5 minutes. REM latency was 187.5 min, which is delayed.  The sleep efficiency was 75.2 %.    SLEEP ARCHITECTURE: WASO (Wake after sleep onset) was 27 minutes, Stage N1 was 8 minutes, Stage N2 was 157.5 minutes, Stage N3 was 0 minutes and Stage R (REM sleep) was 1.5 minutes.  The percentages were Stage N1 4.8%, Stage N2 94.3%, which is markedly increased, Stage N3 was absent, and Stage R (REM sleep) was nearly absent at 0.9%.  The arousals were noted as: 10 were spontaneous, 0 were associated with PLMs, 110 were associated  with respiratory events.   Audio and video analysis did not show any abnormal or unusual movements, behaviors, phonations or vocalizations.  The patient took 3 bathroom breaks for the night. Mild to moderate snoring was noted  EKG was in keeping with normal sinus rhythm (NSR) with occasional PACs and PVCs.   RESPIRATORY ANALYSIS:  There were a total of 110 respiratory events:  2 obstructive apneas, 0 central apneas and 0 mixed apneas with a total of 2 apneas and an apnea index (AI) of .7. There were 108 hypopneas with a hypopnea index of 38.8. The patient also had 0 respiratory event related arousals (RERAs).  Snoring was noted.     The total APNEA/HYPOPNEA INDEX (AHI) was 39.5 /hour and the total RESPIRATORY DISTURBANCE INDEX was 39.5 /hour.  2 events occurred in REM sleep and 214 events in NREM. The REM AHI was 80, /hour versus a non-REM AHI of 39.2 /hour. The patient spent 28.5 minutes sleep time in the supine position 296 minutes in non-supine. The supine AHI was 16.8 /hour versus a non-supine AHI of 44.2 /hour.  OXYGEN SATURATION & C02:  The wake baseline 02 saturation was 99%, with the lowest being 80%. Time spent below 89% saturation equaled 7 minutes.  PERIODIC LIMB MOVEMENTS: The patient had a total of 0 Periodic Limb Movements.  The Periodic Limb Movement (PLM) index was 0 /hour and the PLM Arousal index was 0 /hour.  TITRATION STUDY WITH CPAP RESULTS:   The patient was fitted with a  small Eson nasal mask. CPAP was initiated at 5 cmH20 with heated humidity per AASM split night standards and pressure was advanced to 8 cmH20 because of hypopneas, apneas and desaturations.  At a PAP pressure of 8 cmH20, there was a reduction of the AHI to 0 /hour with non-supine REM sleep achieved, O2 nadir of 93%.   Total recording time (TRT) was 173 minutes, with a total sleep time (TST) of 157 minutes. The patient's sleep latency was 11.5 minutes. REM latency was 33.5 minutes.  The sleep efficiency was  90.8 %.    SLEEP ARCHITECTURE: Wake after sleep was 4 minutes, Stage N1 9 minutes, Stage N2 111 minutes, Stage N3 0 minutes and Stage R (REM sleep) 37 minutes. The percentages were: Stage N1 5.7%, Stage N2 70.7%, which is increased, Stage N3 was absent and Stage R (REM sleep) 23.6%.   The arousals were noted as: 10 were spontaneous, 0 were associated with PLMs, 16 were associated with respiratory events.  RESPIRATORY ANALYSIS:  There were a total of 16 respiratory events: 0 obstructive apneas, 0 central apneas and 0 mixed apneas with a total of 0 apneas and an apnea index (AI) of 0. There were 16 hypopneas with a hypopnea index of 6.1 /hour. The patient also had 0 respiratory event related arousals (RERAs).      The total APNEA/HYPOPNEA INDEX  (AHI) was 6.1 /hour and the total RESPIRATORY DISTURBANCE INDEX was 6.1 /hour.  9 events occurred in REM sleep and 7 events in NREM. The REM AHI was 14.6 /hour versus a non-REM AHI of 3.5 /hour. REM sleep was achieved on a pressure of  cm/h2o (AHI was  .) The patient spent 0% of total sleep time in the supine position. The supine AHI was 0.0 /hour, versus a non-supine AHI of 6.1/hour.  OXYGEN SATURATION & C02:  The wake baseline 02 saturation was 98%, with the lowest being 89%. Time spent below 89% saturation equaled 0 minutes.  PERIODIC LIMB MOVEMENTS: The patient had a total of 0 Periodic Limb Movements. The Periodic Limb Movement (PLM) index was 0 /hour and the PLM Arousal index was 0 /hour.  Post-study, the patient indicated that sleep was the same as usual.  POLYSOMNOGRAPHY IMPRESSION :   1. Obstructive Sleep Apnea (OSA)  2. Non-specific abnormal EKG 3. Dysfunctions associated with sleep stages or arousals from sleep  RECOMMENDATIONS:  1. This patient has severe obstructive sleep apnea and responded well on CPAP therapy. I will, therefore, start the patient on home CPAP treatment at a pressure of 8 cm via medium nasal mask with heated humidity. The  patient should be reminded to be fully compliant with PAP therapy to improve sleep related symptoms and decrease long term cardiovascular risks. Please note that untreated obstructive sleep apnea carries additional perioperative morbidity. Patients with significant obstructive sleep apnea should receive perioperative PAP therapy and the surgeons and particularly the anesthesiologist should be informed of the diagnosis and the severity of the sleep disordered breathing. 2. This study shows sleep fragmentation and abnormal sleep stage percentages; these are nonspecific findings and per se do not signify an intrinsic sleep disorder or a cause for the patient's sleep-related symptoms. Causes include (but are not limited to) the first night effect of the sleep study, circadian rhythm disturbances, medication effect or an underlying mood disorder or medical problem.  3. The patient should be cautioned not to drive, work at heights, or operate dangerous or heavy equipment when tired or sleepy. Review and reiteration  of good sleep hygiene measures should be pursued with any patient. 4. The study showed occasional PACs and PVCs on single lead EKG; clinical correlation is recommended and consultation with cardiology may be feasible.  5. The patient will be seen in follow-up by Dr. Rexene Alberts at [facility] for discussion of the test results and further management strategies. The referring provider will be notified of the test results.  I certify that I have reviewed the entire raw data recording prior to the issuance of this report in accordance with the Standards of Accreditation of the American Academy of Sleep Medicine (AASM)    [name], MD, PhD Diplomat, American Board of Psychiatry and Neurology (Neurology and Sleep Medicine)

## 2016-12-21 NOTE — Progress Notes (Signed)
Diana:  Patient referred by Dr. Philip Aspen, seen by me on 11/29/16, split study on 12/19/16. Please call and notify patient that the recent sleep study confirmed the diagnosis of severe OSA. She did well with CPAP during the study with significant improvement of the respiratory events. Therefore, I would like start the patient on CPAP therapy at home by prescribing a machine for home use. I placed the order in the chart. The patient will need a follow up appointment with me in 10 weeks post set up that has to be scheduled; please go ahead and schedule while you have the patient on the phone and make sure patient understands the importance of keeping this window for the FU appointment, as it is often an insurance requirement and failing to adhere to this may result in losing coverage for sleep apnea treatment.  Please re-enforce the importance of compliance with treatment and the need for [name] to monitor compliance data - again an insurance requirement and good feedback for the patient as far as how they are doing.  Also remind patient, that any upcoming CPAP machine or mask issues, should be first addressed with the DME company. Please ask if patient has a preference regarding DME company.  Please arrange for CPAP set up at home through a DME company of patient's choice - once you have spoken to the patient - and faxed/routed report to PCP and referring MD (if other than PCP), you can close this encounter, thanks,   [name], MD, PhD [facility] ([place])

## 2016-12-21 NOTE — Addendum Note (Signed)
Addended by: [name] on: 12/21/2016 08:32 AM   Modules accepted: Orders

## 2016-12-23 ENCOUNTER — Encounter: Payer: Self-pay | Admitting: Obstetrics & Gynecology

## 2017-01-10 ENCOUNTER — Encounter: Payer: Self-pay | Admitting: Obstetrics & Gynecology

## 2017-01-10 ENCOUNTER — Ambulatory Visit (INDEPENDENT_AMBULATORY_CARE_PROVIDER_SITE_OTHER): Payer: Medicare Other | Admitting: Obstetrics & Gynecology

## 2017-01-10 VITALS — BP 140/84 | HR 64 | Resp 12 | Ht 61.5 in | Wt 245.6 lb

## 2017-01-10 DIAGNOSIS — I10 Essential (primary) hypertension: Secondary | ICD-10-CM | POA: Insufficient documentation

## 2017-01-10 DIAGNOSIS — N3281 Overactive bladder: Secondary | ICD-10-CM

## 2017-01-10 DIAGNOSIS — G4730 Sleep apnea, unspecified: Secondary | ICD-10-CM | POA: Insufficient documentation

## 2017-01-10 DIAGNOSIS — G4733 Obstructive sleep apnea (adult) (pediatric): Secondary | ICD-10-CM

## 2017-01-10 DIAGNOSIS — R42 Dizziness and giddiness: Secondary | ICD-10-CM | POA: Diagnosis not present

## 2017-01-10 DIAGNOSIS — E039 Hypothyroidism, unspecified: Secondary | ICD-10-CM | POA: Insufficient documentation

## 2017-01-10 DIAGNOSIS — E031 Congenital hypothyroidism without goiter: Secondary | ICD-10-CM

## 2017-01-10 DIAGNOSIS — E785 Hyperlipidemia, unspecified: Secondary | ICD-10-CM

## 2017-01-10 DIAGNOSIS — Z01419 Encounter for gynecological examination (general) (routine) without abnormal findings: Secondary | ICD-10-CM | POA: Diagnosis not present

## 2017-01-10 NOTE — Progress Notes (Signed)
83 y.o. G0P0000 SingleCaucasianF here for annual exam.  Reports she is having some vertigo issues today.  This is a recurrent problem for her.    Has severe OSA.  Has been recently evaluated by Dr. Rexene Alberts.  Having trouble with the company calling her back about new face piece for her CPAP machine.  Not sure what to do.  Denies vaginal bleeding.   PCP:  Dr. Sharlett Iles  Patient's last menstrual period was 01/22/2011.          Sexually active: No.  The current method of family planning is post menopausal status.    Exercising: No.  The patient does not participate in regular exercise at present. Smoker:  no  Health Maintenance: Pap:  11/14/14 Neg; 05/29/12 Neg History of abnormal Pap:  no MMG: 10/12/16 BIRADS1, Density C, [facility] Colonoscopy: 02/07/07 Diverticulosis.  BMD:  10/12/16 Normal.  TDaP: 05/2012 Pneumonia vaccine(s):  PCP Zostavax:   Never Hep C testing: PCP Screening Labs: PCP   reports that she has never smoked. She has never used smokeless tobacco. She reports that she does not drink alcohol or use drugs.  Past Medical History:  Diagnosis Date  . Atrial fibrillation ([place])   . DVT (deep venous thrombosis) ([place])   . Elevated lipids   . Gallstone   . Hypertension   . Hypothyroidism   . Infection 05/2013   Wheeping Legs ([place])  . Nephrolithiasis   . Overactive bladder   . Pulmonary emboli ([facility])    while on HRT  . SCC (squamous cell carcinoma)    of left leg  . Sleep apnea    on C-pap  . Vertigo     Past Surgical History:  Procedure Laterality Date  . CHOLECYSTECTOMY     Dr. Zenia Resides  . CYSTOSCOPY WITH STENT PLACEMENT  9/12   nephrolithaisis  . HYSTEROSCOPY W/D&C  7/09   PMP with endometrial polyp  . REPLACEMENT TOTAL KNEE Bilateral 1998, 2001      . TONSILLECTOMY AND ADENOIDECTOMY      Current Outpatient Prescriptions  Medication Sig Dispense Refill  . atorvastatin (LIPITOR) 40 MG tablet Take 40 mg by mouth daily. Reported on 01/02/2016  0  . COUMADIN 2  MG tablet Take 1-2 mg by mouth daily. Take 2mg s daily on mondays and thursdays take 1mg  on all other days of the week  0  . digoxin (LANOXIN) 0.25 MG tablet Take 0.25 mg by mouth daily.    Marland Kitchen levothyroxine (SYNTHROID, LEVOTHROID) 125 MCG tablet Take 125 mcg by mouth daily before breakfast.    . meclizine (ANTIVERT) 25 MG tablet Take 1 tablet (25 mg total) by mouth 3 (three) times daily as needed for dizziness. 30 tablet 0  . metoprolol succinate (TOPROL-XL) 100 MG 24 hr tablet Take 100 mg by mouth daily.  0   No current facility-administered medications for this visit.     Family History  Problem Relation Age of Onset  . Hypertension Father   . Heart disease Father   . Hypertension Mother   . Hyperlipidemia Mother   . Heart attack Mother   . Prostate cancer Brother     ROS:  Pertinent items are noted in HPI.  Otherwise, a comprehensive ROS was negative.  Exam:   BP 140/84 (BP Location: Right Arm, Patient Position: Sitting, Cuff Size: Large)   Pulse 64   Resp 12   Ht 5' 1.5" (1.562 m)   Wt 245 lb 9.6 oz (111.4 kg)   LMP 01/22/2011  BMI 45.65 kg/m    Height: 5' 1.5" (156.2 cm)  Ht Readings from Last 3 Encounters:  01/10/17 5' 1.5" (1.562 m)  11/29/16 5\' 1"  (1.549 m)  01/02/16 5\' 2"  (1.575 m)    General appearance: alert, cooperative and appears stated age Head: Normocephalic, without obvious abnormality, atraumatic Neck: no adenopathy, supple, symmetrical, trachea midline and thyroid normal to inspection and palpation Lungs: clear to auscultation bilaterally Breasts: normal appearance, no masses or tenderness Heart: regular rate and rhythm Abdomen: soft, non-tender; bowel sounds normal; no masses,  no organomegaly Extremities: darkened and mottled skin of lower extremities from mid cal down, left big toe with blood on it and skin that has been torn. Skin: Skin color, texture, turgor normal. No rashes or lesions Lymph nodes: Cervical, supraclavicular, and axillary nodes  normal. No abnormal inguinal nodes palpated Neurologic: Grossly normal   Pelvic: External genitalia:  no lesions              Urethra:  normal appearing urethra with no masses, tenderness or lesions              Bartholins and Skenes: normal                 Vagina: normal appearing vagina with normal color and discharge, no lesions              Cervix: no lesions              Pap taken: No. Bimanual Exam:  Uterus:  normal size, contour, position, consistency, mobility, non-tender              Adnexa: normal adnexa and no mass, fullness, tenderness               Rectovaginal: Confirms               Anus:  normal sphincter tone, no lesions  Left foot cleansed and torn skin gently wiped. Sizable piece of skin just sloughed off with thickened purplish tissue at end of nail bed (possible hematoma?).  Foot cleansed.  Odor present from feet bilaterally.  No active bleeding.  Toe dressed.  Chaperone was present for exam.  A:  Well Woman with normal exam PMP, no HRT H/O PE when on HRT H/O LE venous statis with recent left big toe injury H/O SCC  Elevated lipids Hypothryoidism OSA, using CPAP  P:   Mammogram guidelines reviewed pap smear discussed with pt today.  Last was normal in 2016 Lab work with Dr. Sharlett Iles.  Blood work was all normal in 2/18 Will let Dr. Dia Sitter know about her issues with getting her new mask for her CPAP machine Highly encouraged pt to be seen ASAP by podiatry.  Declines having my office call for appt.  States she will do this.  States she is not concerned about toe issue although I advised this looks quite abnormal to me.  Again, declines my help for appointment. Return annually or prn

## 2017-01-12 ENCOUNTER — Ambulatory Visit (INDEPENDENT_AMBULATORY_CARE_PROVIDER_SITE_OTHER): Payer: Medicare Other | Admitting: Podiatry

## 2017-01-12 ENCOUNTER — Encounter: Payer: Self-pay | Admitting: Podiatry

## 2017-01-12 VITALS — BP 153/73 | HR 60 | Temp 97.4°F | Resp 18

## 2017-01-12 DIAGNOSIS — M79676 Pain in unspecified toe(s): Secondary | ICD-10-CM | POA: Diagnosis not present

## 2017-01-12 DIAGNOSIS — B351 Tinea unguium: Secondary | ICD-10-CM

## 2017-01-12 DIAGNOSIS — L03032 Cellulitis of left toe: Secondary | ICD-10-CM

## 2017-01-12 MED ORDER — CEPHALEXIN 500 MG PO CAPS: 500.0000 mg | ORAL_CAPSULE | Freq: Three times a day (TID) | ORAL | 0 refills | Status: DC

## 2017-01-12 NOTE — Patient Instructions (Addendum)
Betadine Soak Instructions  Purchase an 8 oz. bottle of BETADINE solution (Povidone)  THE DAY AFTER THE PROCEDURE  Place 1 tablespoon of betadine solution in a quart of warm tap water.  Submerge your foot or feet with outer bandage intact for the initial soak; this will allow the bandage to become moist and wet for easy lift off.  Once you remove your bandage, continue to soak in the solution for 2 minutes.  This soak should be done1-2 a day.  Next, remove your foot or feet from solution, blot dry the affected area and cover.  You may use a band aid large enough to cover the area or use gauze and tape.  Apply other medications to the area as directed by the doctor such as Neosporin ointment  IF YOUR SKIN BECOMES IRRITATED WHILE USING THESE INSTRUCTIONS, IT IS OKAY TO SWITCH TO EPSOM SALTS AND WATER OR WHITE VINEGAR AND WATER.  Begin taking cephalexin 500 mg capsules 1 capsule 3 times a day 7 days Return if the local infection does not improve after completing antibiotics

## 2017-01-12 NOTE — Progress Notes (Signed)
   Subjective:    Patient ID: Katrina [name], female    DOB: 1932/02/17, 83 y.o.   MRN: [id number]  HPI   .   This patient presents today with approximately 5 day history of a swelling and drainage around the left hallux toenail. Patient does not recall any direct injury to the nail. Patient presented to primary care physician approximately 2 days ago who referred patient to your office for further evaluation and treatment. Patient also relates a history of a recent fall today, however, denies any foot or leg pain at this time Patient denies history of diabetes Patient denies smoking history Patient states that she has taken cephalexin in the past without any difficulty even though she mentions a penicillin allergy  Patient has friend present in the treatment room   Review of Systems  Musculoskeletal: Positive for neck pain.  Neurological: Positive for dizziness.  Hematological: Bruises/bleeds easily.  All other systems reviewed and are negative.      Objective:   Physical Exam Orientated 3  Vascular: DP and PT pulses 2/4 bilaterally Capillary reflex immediate bilaterally  Neurological: Sensation to 10 g monofilament wire intact 5/5 bilaterally Vibratory sensation reactive bilaterally Ankle reflexes reactive bilaterally  Dermatological: Hyperpigmentation lower legs bilaterally Surgical scar medial right first MPJ Organize keratotic lesion medial right first MPJ Left hallux posterior nail fold is granulation tissue. There is a low-grade erythema edema at the distal left hallux. The left hallux nail plate is friable and loosely attached to the underlying nailbed. There is no malodor noted All remaining nails are elongated and brittle and discolored  Musculoskeletal: Manual motor testing dorsi flexion, plantar flexion, inversion, eversion 5/5 bilaterally Restricted range of motion first MPJ       Assessment & Plan:   Assessment: Paronychia left hallux Mycotic toenails  6-10  Plan: The left hallux nail was debrided easily was debrided completely from the underlying nailbed. The nail bed has a moist macerated base with granulation tissue at the posterior nail fold. There is no active drainage or possible released. The remaining nails are debrided  Apply topical antibiotic ointment dressing to the left hallux Rx Betadine soaks Rx cephalexin 500 mg by mouth 3 times a day 7 days (Please note patient states that she's had cephalexin without any difficulty)  Patient will return if the symptoms do not resolve or improve after completing antibiotic therapy and soaks

## 2017-03-07 ENCOUNTER — Telehealth: Payer: Self-pay

## 2017-03-07 NOTE — Telephone Encounter (Signed)
I had an extended conversation with this pt. She has not started her cpap yet, even though it was set up on 01/18/2017. She has had lots of problems with dizziness recently and is insistent that she needs to be seen tomorrow to discuss the dizziness before she starts her cpap. I explained that [facility] generally requires an office visit with the prescribing cpap physican 30-90 days after starting cpap to document compliance. I explained that pt may need to follow up again with [name] in a couple months to have this compliance visit, since she has yet to start her cpap. Pt says that she will do this but is very insistent that she needs to be seen tomorrow before she starts her cpap.

## 2017-03-08 ENCOUNTER — Encounter: Payer: Self-pay | Admitting: Neurology

## 2017-03-08 ENCOUNTER — Ambulatory Visit (INDEPENDENT_AMBULATORY_CARE_PROVIDER_SITE_OTHER): Payer: Medicare Other | Admitting: Neurology

## 2017-03-08 ENCOUNTER — Telehealth: Payer: Self-pay

## 2017-03-08 VITALS — BP 130/80 | HR 60 | Ht 61.0 in | Wt 245.0 lb

## 2017-03-08 DIAGNOSIS — G4733 Obstructive sleep apnea (adult) (pediatric): Secondary | ICD-10-CM | POA: Diagnosis not present

## 2017-03-08 NOTE — Progress Notes (Signed)
Subjective:    Patient ID: Katrina [name] is a 83 y.o. female.  HPI     Interim history:  Katrina [name] is a 83 year old right-handed woman with an underlying medical history of vertigo, hypothyroidism, hypertension, depression, LE swelling, kidney stones, paroxysmal SVT, A fib, hyperlipidemia, and morbid obesity, who presents for follow-up consultation of her obstructive sleep apnea, after recent sleep study testing. The patient is unaccompanied today. I first met her on 11/29/2016 at the request of her primary care physician, at which time she reported a prior diagnosis of obstructive sleep apnea over 10 years prior and difficulty tolerating CPAP at the time. She was willing to undergo testing and treatment again. I invited her for sleep study testing. She had a split-night sleep study on 12/19/2016. I went over her test results with her in detail today. Baseline sleep efficiency was 75.2%, sleep latency was 13.5 minutes and REM latency prolonged at 187.5 minutes. She had an increased percentage of stage II sleep, slow-wave sleep was absent and REM sleep was nearly absent. She had mild to moderate snoring, EKG showed occasional PACs and PVCs. Total AHI was 39.5 per hour, REM AHI was 80 per hour, supine AHI was 16.8 per hour. Average oxygen saturation was 99%, nadir was 80%. She was fitted with a small nasal mask. CPAP was titrated from 5 cm to 8 cm. On the final pressure, her AHI was 0 per hour with nonsupine REM sleep achieved, O2 nadir of 93%, she had no slow-wave sleep during the second part of the study but achieved REM sleep at 23.6%. She had no significant PLMS during the study. She had 3 bathroom breaks for the night. Based on her test results are prescribed CPAP therapy for home use. She did not start treatment and requested a follow-up appointment to discuss results and treatment.  Today, 03/08/2017 (all dictated new, as well as above notes, some dictation done in note pad or Word, outside of chart, may  appear as copied):  She reports that she had her CPAP machine set up but has been struggling with intermittent dizziness and vertigo spells. She is agreeable to starting CPAP therapy. We talked about her test results in detail. She had no recent medication changes. She did have one recent fall, fell into her az[place]a bush, thankfully no injury but did have a sore spot on her head, no loss of consciousness, no sustained headaches. She tries to hydrate, she uses a 4 point cane to walk, she tries to change position slowly and has not had any recent medication changes. She does endorse nocturia about twice per average night and daytime somnolence, has a tendency to fall asleep sitting in her recliner.  The patient's allergies, current medications, family history, past medical history, past social history, past surgical history and problem list were reviewed and updated as appropriate.   Previously (copied from previous notes for reference):   11/29/16: (She) was diagnosed with obstructive sleep apnea in or around 2003. She has not been on CPAP therapy. She has in the interim seen my colleague Dr. Brett Fairy about 10 years ago, at which time patient was scheduled for a sleep study. She was placed on CPAP at 10 cm water pressure. She has not been using her CPAP machine. She had trouble with the nasal mask. I reviewed your office note from 10/05/2016 which you kindly included. A CPAP download is not available for my review today. Her Epworth sleepiness score is 12 out of 24 today, her fatigue  score is 18 out of 63. She reports a history of recurrent vertigo and had another bout this weekend.  She lives alone, is single and has no children, worked as a Web designer at [employer]. She is a non-smoker, does not drink alcohol and does not drink caffeine daily. She has no pets. She naps in the afternoons.  She has a bedtime of 11:00 to 11:30 PM after the news usually. She has nocturia once or twice per night. She has maintained  weight. She likes to drink chocolate milk.  She has a Hx of phlebitis/dermatitis. She denies RLS symptoms. Her brother has OSA.   Her Past Medical History Is Significant For: Past Medical History:  Diagnosis Date  . Atrial fibrillation ([place])   . DVT (deep venous thrombosis) ([place])   . Elevated lipids   . Gallstone   . Hypertension   . Hypothyroidism   . Infection 05/2013   Wheeping Legs ([place])  . Nephrolithiasis   . Overactive bladder   . Pulmonary emboli ([facility])    while on HRT  . SCC (squamous cell carcinoma)    of left leg  . Sleep apnea    on C-pap  . Vertigo     Her Past Surgical History Is Significant For: Past Surgical History:  Procedure Laterality Date  . CHOLECYSTECTOMY     Dr. Zenia Resides  . CYSTOSCOPY WITH STENT PLACEMENT  9/12   nephrolithaisis  . HYSTEROSCOPY W/D&C  7/09   PMP with endometrial polyp  . REPLACEMENT TOTAL KNEE Bilateral 1998, 2001      . TONSILLECTOMY AND ADENOIDECTOMY      Her Family History Is Significant For: Family History  Problem Relation Age of Onset  . Hypertension Father   . Heart disease Father   . Hypertension Mother   . Hyperlipidemia Mother   . Heart attack Mother   . Prostate cancer Brother     Her Social History Is Significant For: Social History   Social History  . Marital status: Single    Spouse name: N/A  . Number of children: N/A  . Years of education: N/A   Social History Main Topics  . Smoking status: Never Smoker  . Smokeless tobacco: Never Used  . Alcohol use No  . Drug use: No  . Sexual activity: No   Other Topics Concern  . None   Social History Narrative  . None    Her Allergies Are:  Allergies  Allergen Reactions  . Penicillins     Mother and brother have allergy, patient just does not take this Did PCN reaction causing immediate rash, facial/tongue/throat swelling, SOB or lightheadedness with hypotension: unknown Did PCN reaction causing severe rash involving mucus membranes or skin  necrosis:unknown Did PCN reaction that required hospitalization : unknown Did PCN reaction occurring within the last 10 years: unknown Pt never actually took med. Did okay with ampicillin  :   Her Current Medications Are:  Outpatient Encounter Prescriptions as of 03/08/2017  Medication Sig  . atorvastatin (LIPITOR) 40 MG tablet Take 40 mg by mouth daily. Reported on 01/02/2016  . COUMADIN 2 MG tablet Take 1-2 mg by mouth daily. Take 55ms daily on mondays and take 174mon all other days of the week  . digoxin (LANOXIN) 0.25 MG tablet Take 0.25 mg by mouth daily.  . Marland Kitchenevothyroxine (SYNTHROID, LEVOTHROID) 125 MCG tablet Take 125 mcg by mouth daily before breakfast.  . meclizine (ANTIVERT) 25 MG tablet Take 1 tablet (25 mg total) by  mouth 3 (three) times daily as needed for dizziness.  . metoprolol succinate (TOPROL-XL) 100 MG 24 hr tablet Take 100 mg by mouth daily.  . [DISCONTINUED] cephALEXin (KEFLEX) 500 MG capsule Take 1 capsule (500 mg total) by mouth 3 (three) times daily.   No facility-administered encounter medications on file as of 03/08/2017.   :  Review of Systems:  Out of a complete 14 point review of systems, all are reviewed and negative with the exception of these symptoms as listed below: Review of Systems  Neurological:       Pt presents today to discuss her sleep study results and her cpap. Pt has not started her cpap yet and wants to discuss this with Dr. Rexene Alberts. Pt has her cpap; it was sent up on 01/18/2017 but she has yet to use it.    Objective:  Neurological Exam  Physical Exam Physical Examination:   Vitals:   03/08/17 1130  BP: 130/80  Pulse: 60   General Examination: The patient is a very pleasant 83 y.o. female in no acute distress. She appears well-developed and well-nourished and well groomed. Some lightheadedness upon standing, no true vertigo spell.  HEENT: Normocephalic, atraumatic, pupils are equal, round and reactive to light and accommodation, s/p  cataract repairs. Extraocular tracking is good without limitation to gaze excursion or nystagmus noted. Normal smooth pursuit is noted. Hearing is grossly intact. Face is symmetric with normal facial animation and normal facial sensation. Speech is clear with no dysarthria noted. There is no hypophonia. There is no lip, neck/head, jaw or voice tremor. Neck is supple with full range of passive and active motion. There are no carotid bruits on auscultation. Oropharynx exam reveals: mild mouth dryness, adequate dental hygiene and moderate airway crowding. Mallampati is class II. Tongue protrudes centrally and palate elevates symmetrically. Tonsils are absent.   Chest: Clear to auscultation without wheezing, rhonchi or crackles noted.  Heart: S1+S2+0, regular and normal without murmurs, rubs or gallops noted.   Abdomen: Soft, non-tender and non-distended with normal bowel sounds appreciated on auscultation.  Extremities: There is 1 to 2+ pitting edema in the distal lower extremities bilaterally with chronic thickening/discoloration/scaling of the leg skin.   Skin: Warm and dry without trophic changes noted.  Musculoskeletal: exam reveals no obvious joint deformities, tenderness or joint swelling or erythema.   Neurologically:  Mental status: The patient is awake, alert and oriented in all 4 spheres. Her immediate and remote memory, attention, language skills and fund of knowledge are appropriate. There is no evidence of aphasia, agnosia, apraxia or anomia. Speech is clear with normal prosody and enunciation. Thought process is linear. Mood is normal and affect is normal.  Cranial nerves II - XII are as described above under HEENT exam. In addition: shoulder shrug is normal with equal shoulder height noted. Motor exam: Normal bulk, strength and tone is noted. There is no drift, tremor or rebound. Romberg is not possible safely. Reflexes are 1+ in the UEs. Fine motor skills and coordination:  grossly intact in the UEs. Swelling impairs LE movements.   Cerebellar testing: No dysmetria or intention tremor. There is no truncal or gait ataxia.  Sensory exam: intact to light touch in the upper and lower extremities.  Gait, station and balance: She stands with difficulty. She denies vertigo with standing but briefly feels lightheaded. She walks with a 4 prong cane. Tandem walk is not possible safely.   Assessment and Plan:  In summary, Katrina [name] is a very pleasant  83 year old female with an underlying medical history of vertigo, hypothyroidism, hypertension, depression, LE swelling, kidney stones, paroxysmal SVT, A fib, hyperlipidemia, and morbid obesity, who presents for follow-up consultation of her obstructive sleep apnea. She had a split-night sleep study on 12/19/2016, did well with CPAP therapy but has not started it yet. She is willing to get started on treatment, we talked about her sleep study results in detail. She is advised that she may find benefit in treatment with time including improvement of daytime somnolence, daytime energy, dizzy spells and nocturia. Physical exam is stable.  I again explained the risks and ramifications of untreated moderate to severe OSA, especially with respect to developing cardiovascular disease down the Road, including congestive heart failure, difficult to treat hypertension, cardiac arrhythmias, or stroke. Even type 2 diabetes has, in part, been linked to untreated OSA. Symptoms of untreated OSA include daytime sleepiness, memory problems, mood irritability and mood disorder such as depression and anxiety, lack of energy, as well as recurrent headaches, especially morning headaches. We talked about trying to maintain a healthy lifestyle in general, as well as the importance of weight control. I encouraged the patient to eat healthy, exercise daily and keep well hydrated, to keep a scheduled bedtime and wake time routine, to not skip any meals and eat  healthy snacks in between meals. I advised the patient not to drive when feeling sleepy. I also explained the importance of being compliant with PAP treatment, not only for insurance purposes but primarily to improve Her symptoms, and for the patient's long term health benefit, including to reduce Her cardiovascular risks. I suggested a FU in about 3 months with one of our NPs for symptom recheck and compliance check.  I answered all her questions today and the patient was in agreement.  I spent 25 minutes in total face-to-face time with the patient, more than 50% of which was spent in counseling and coordination of care, reviewing test results, reviewing medication and discussing or reviewing the diagnosis of OSA, its prognosis and treatment options. Pertinent laboratory and imaging test results that were available during this visit with the patient were reviewed by me and considered in my medical decision making (see chart for details).

## 2017-03-08 NOTE — Patient Instructions (Signed)
I would like for you to start your CPAP therapy at home. I have placed the order in the chart. Please get in touch with [facility] to get comfortable with using it. You will need as follow up appointment in about 10 weeks post set up, that has to be scheduled; please go ahead and schedule with one of our nurse practitioners.  We have arranged for your CPAP set up at home through [facility], a local DME company.  Please use your CPAP regularly. While your insurance requires that you use CPAP at least 4 hours each night on 70% of the nights, I recommend, that you not skip any nights and use it throughout the night if you can, even for scheduled naps. Getting used to CPAP and staying with the treatment long term does take time and patience and discipline. Untreated obstructive sleep apnea when it is moderate to severe can have an adverse impact on cardiovascular health and raise her risk for heart disease, arrhythmias, hypertension, congestive heart failure, stroke and diabetes. Untreated obstructive sleep apnea causes sleep disruption, nonrestorative sleep, and sleep deprivation. This can have an impact on your day to day functioning and cause daytime sleepiness and impairment of cognitive function, memory loss, mood disturbance, and problems focussing. Using CPAP regularly can improve these symptoms.

## 2017-03-08 NOTE — Telephone Encounter (Signed)
I called pt, offered her a sooner appt of 11:30am today instead of 1pm. Pt accepted and verbalized understanding to arrive at 11:15am with her cpap.

## 2017-05-30 ENCOUNTER — Encounter (INDEPENDENT_AMBULATORY_CARE_PROVIDER_SITE_OTHER): Payer: Medicare Other | Admitting: Adult Health

## 2017-05-30 ENCOUNTER — Encounter: Payer: Self-pay | Admitting: Adult Health

## 2017-05-30 NOTE — Progress Notes (Signed)
Pt not seen, has not used cpap.  She states that she has been having post nasal drip that makes it difficult.   She also only sleeps 3 hours nightly, and has some dizziness at times.  I told her that will see back in 08/04/17 for cpap compliance.  I relayed that needs to use at least 4 hours for compliance/ for 30 days.  [employer] is her DME.  For her resp/ nasal issues see pcp.  If appt cancellation sooner can call her to

## 2017-06-01 NOTE — Progress Notes (Signed)
This encounter was created in error - please disregard.

## 2017-06-11 IMAGING — CR DG CHEST 2V
2 series · 2 of 2 positions shown · non-contrast
Comparison: 04/23/2010 and earlier.

CLINICAL DATA: 84-year-old female with chest pain and vertigo since
yesterday. Initial encounter.

EXAM:
CHEST  2 VIEW

[w chest pa]
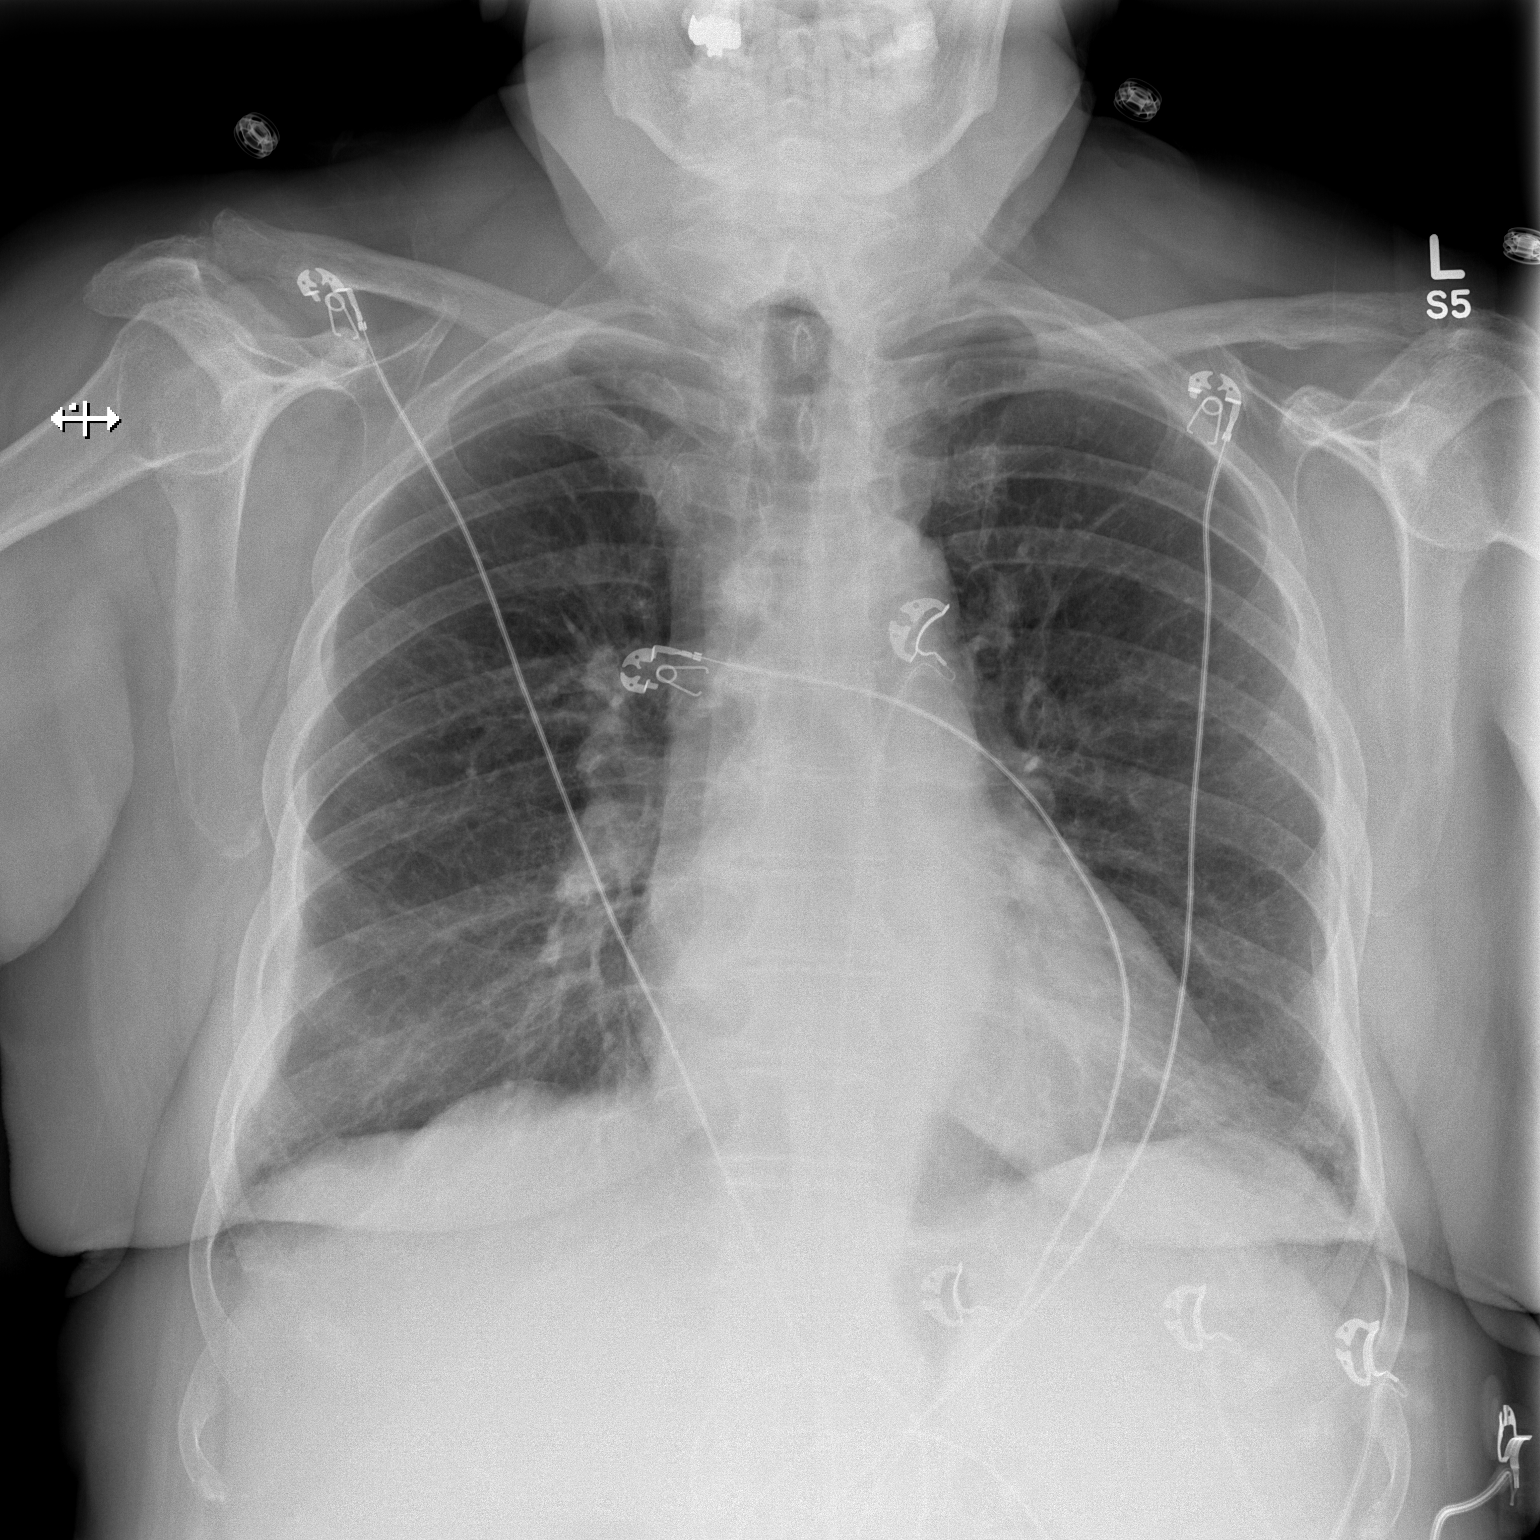

[w chest lat]
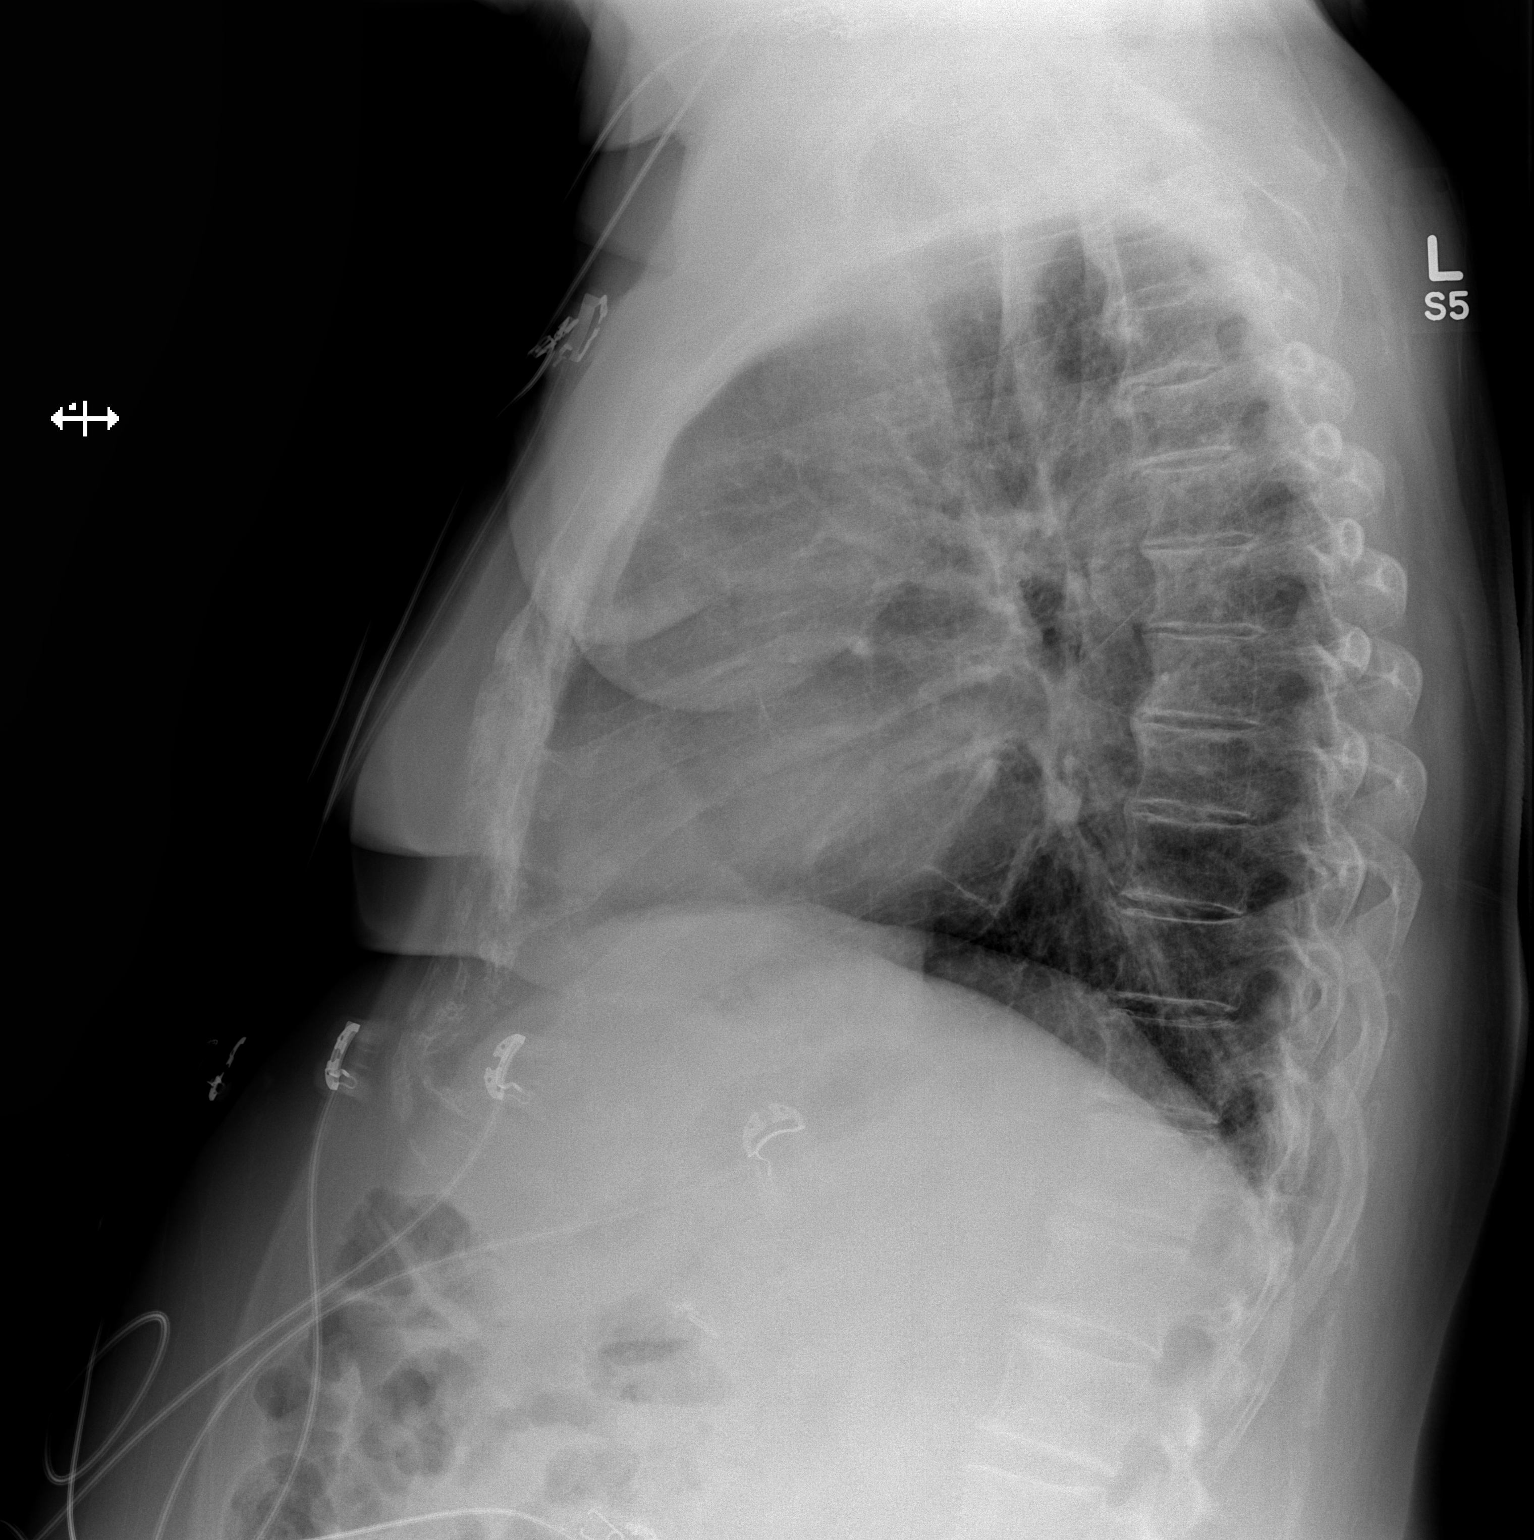

[2 of 2 positions shown; findings below may reference images not displayed]

FINDINGS: Stable and normal lung volumes. Mild eventration of the right
hemidiaphragm. Cardiac size is at the upper limits of normal. Other
mediastinal contours are within normal limits. Visualized tracheal
air column is within normal limits. No pneumothorax, pulmonary
edema, pleural effusion or acute pulmonary opacity. Mild chronic
increased reticular opacity at both lung bases. No acute osseous
abnormality identified. Cholecystectomy clips in the right upper
quadrant.
IMPRESSION: No acute cardiopulmonary abnormality.

## 2017-08-04 ENCOUNTER — Ambulatory Visit: Payer: Medicare Other | Admitting: Adult Health

## 2017-10-27 ENCOUNTER — Ambulatory Visit: Payer: Medicare Other | Admitting: Adult Health

## 2017-10-31 ENCOUNTER — Encounter: Payer: Self-pay | Admitting: Obstetrics & Gynecology

## 2017-11-01 ENCOUNTER — Encounter: Payer: Self-pay | Admitting: Obstetrics & Gynecology

## 2017-11-07 DIAGNOSIS — L989 Disorder of the skin and subcutaneous tissue, unspecified: Secondary | ICD-10-CM | POA: Insufficient documentation

## 2017-11-15 ENCOUNTER — Ambulatory Visit: Payer: Medicare Other | Admitting: Sports Medicine

## 2017-11-15 ENCOUNTER — Encounter: Payer: Self-pay | Admitting: Sports Medicine

## 2017-11-15 DIAGNOSIS — I890 Lymphedema, not elsewhere classified: Secondary | ICD-10-CM | POA: Diagnosis not present

## 2017-11-15 DIAGNOSIS — M79676 Pain in unspecified toe(s): Secondary | ICD-10-CM

## 2017-11-15 DIAGNOSIS — D689 Coagulation defect, unspecified: Secondary | ICD-10-CM

## 2017-11-15 DIAGNOSIS — I739 Peripheral vascular disease, unspecified: Secondary | ICD-10-CM

## 2017-11-15 DIAGNOSIS — B351 Tinea unguium: Secondary | ICD-10-CM

## 2017-11-15 NOTE — Progress Notes (Signed)
Subjective: Katrina [name] is a 84 y.o. female patient seen today in office with complaint of mildly painful thickened and elongated toenails; unable to trim. Patient denies history of Diabetes or Neuropathy but admits Vascular disease/Swelling in legs  On Warfarin Hx of clots) and vertigo. Patient has no other pedal complaints at this time.   Patient Active Problem List   Diagnosis Date Noted  . Sleep apnea 01/10/2017  . Hypertension 01/10/2017  . Hypothyroidism 01/10/2017  . Overactive bladder 01/10/2017  . Vertigo 01/10/2017  . Elevated lipids 01/10/2017    Current Outpatient Medications on File Prior to Visit  Medication Sig Dispense Refill  . atorvastatin (LIPITOR) 40 MG tablet Take 40 mg by mouth daily. Reported on 01/02/2016  0  . COUMADIN 2 MG tablet Take 1-2 mg by mouth daily. Take 2mg s daily on mondays and take 1mg  on all other days of the week  0  . digoxin (LANOXIN) 0.25 MG tablet Take 0.25 mg by mouth daily.    Marland Kitchen levothyroxine (SYNTHROID, LEVOTHROID) 125 MCG tablet Take 125 mcg by mouth daily before breakfast.    . meclizine (ANTIVERT) 25 MG tablet Take 1 tablet (25 mg total) by mouth 3 (three) times daily as needed for dizziness. 30 tablet 0  . metoprolol succinate (TOPROL-XL) 100 MG 24 hr tablet Take 100 mg by mouth daily.  0   No current facility-administered medications on file prior to visit.     Allergies  Allergen Reactions  . Penicillins     Mother and brother have allergy, patient just does not take this Did PCN reaction causing immediate rash, facial/tongue/throat swelling, SOB or lightheadedness with hypotension: unknown Did PCN reaction causing severe rash involving mucus membranes or skin necrosis:unknown Did PCN reaction that required hospitalization : unknown Did PCN reaction occurring within the last 10 years: unknown Pt never actually took med. Did okay with ampicillin    Objective: Physical Exam  General: Well developed, nourished, no acute distress,  awake, alert and oriented x 3  Vascular: Dorsalis pedis artery 0/4 bilateral, Posterior tibial artery 0/4 bilateral, skin temperature warm to cool proximal to distal bilateral lower extremities,  Severe lymphedema and trophic skin changes, legs look like tree trunks with nonpitting edema, no pedal hair present bilateral.  Neurological: Gross sensation present via light touch bilateral.   Dermatological: Skin is warm, dry, and supple bilateral, Nails 1-10 are tender, long, thick, and discolored with mild subungal debris, no webspace macerations present bilateral, no open lesions present bilateral, no callus/corns/hyperkeratotic tissue present bilateral. No signs of infection bilateral.  Musculoskeletal: Asymptomatic planus boney deformities noted bilateral. Muscular strength within normal limits without pain on range of motion. No pain with calf compression bilateral.  Assessment and Plan:  Problem List Items Addressed This Visit    None    Visit Diagnoses    Dermatophytosis of nail    -  Primary   Pain of toe, unspecified laterality       PVD (peripheral vascular disease) ([place])       Lymphedema       Coagulopathy ([place])           -Examined patient.  -Discussed treatment options for painful mycotic nails. -Mechanically debrided and reduced mycotic nails with sterile nail nipper and dremel nail file without incident. -Recommend continue with compression garments and recommendations from vascular doctor for lymphedema -Patient to return in 3 months for follow up evaluation or sooner if symptoms worsen.  Landis Martins, DPM

## 2018-01-18 ENCOUNTER — Telehealth: Payer: Self-pay | Admitting: *Deleted

## 2018-01-18 NOTE — Telephone Encounter (Signed)
I called pt. She reports that she has not used her cpap. I encouraged her to use it every night for at least 4 hours. Her appt with Dr. Rexene Alberts was cancelled for tomorrow and pushed out until 02/27/18 at 11:30am, check in 11:00am with Jinny Blossom, NP. Pt verbalized understanding of these recommendations, including nightly use of cpap of at least 4 hours, and of new appt date and time.

## 2018-01-18 NOTE — Telephone Encounter (Signed)
She has an appointment scheduled for tomorrow but  Says she has not really been able to use the CPAP.  Does she need that appt.? Also wonders if 3 hours it enough use?

## 2018-01-19 ENCOUNTER — Ambulatory Visit: Payer: Medicare Other | Admitting: Neurology

## 2018-01-23 ENCOUNTER — Other Ambulatory Visit: Payer: Self-pay

## 2018-01-23 ENCOUNTER — Ambulatory Visit (INDEPENDENT_AMBULATORY_CARE_PROVIDER_SITE_OTHER): Payer: Medicare Other | Admitting: Obstetrics & Gynecology

## 2018-01-23 ENCOUNTER — Encounter: Payer: Self-pay | Admitting: Obstetrics & Gynecology

## 2018-01-23 VITALS — BP 136/80 | HR 80 | Resp 16 | Ht 61.75 in | Wt 240.4 lb

## 2018-01-23 DIAGNOSIS — Z01419 Encounter for gynecological examination (general) (routine) without abnormal findings: Secondary | ICD-10-CM

## 2018-01-23 NOTE — Progress Notes (Signed)
84 y.o. G0P0000 SingleCaucasianF here for annual exam.  Doing well except for vertigo issues.  Using a cane when she is outside of her home.    At last visit, she was having issues with getting her CPAP adjusted.  Reports she is aware not using this does increase her risks of heart disease.  Saw Dr. Rexene Alberts in July and an extensive discussion appears to have occurred about the risks of not using her CPAP.    Denies vaginal bleeding.    PCP:  Dr. Sharlett Iles.  Last appt was in 11/07/17.  Brought lab work with her.    Patient's last menstrual period was 01/22/2011.          Sexually active: No.  The current method of family planning is post menopausal status.    Exercising: No.   Smoker:  no  Health Maintenance: Pap:  11/14/14 Neg  History of abnormal Pap:  no MMG:  10/26/17 BIRADS2:Benign  Colonoscopy:  02/07/07 Normal.  Not indicated at this time. BMD:   10/12/16 Normal  TDaP:  05/2012 Pneumonia vaccine(s):  Pt reports she's done both of these Shingrix:  No.  Reports Dr. Sharlett Iles did not recommend this for her due to cost of medication Hep C testing:  Not indicated Screening Labs: PCP   reports that she has never smoked. She has never used smokeless tobacco. She reports that she does not drink alcohol or use drugs.  Past Medical History:  Diagnosis Date  . Atrial fibrillation ([place])   . DVT (deep venous thrombosis) ([place])   . Elevated lipids   . Gallstone   . Hypertension   . Hypothyroidism   . Infection 05/2013   [place] Legs (Both Legs)  . Nephrolithiasis   . Overactive bladder   . Pulmonary emboli ([facility])    while on HRT  . SCC (squamous cell carcinoma)    of left leg  . Sleep apnea    on C-pap  . Vertigo     Past Surgical History:  Procedure Laterality Date  . CHOLECYSTECTOMY     Dr. Zenia Resides  . CYSTOSCOPY WITH STENT PLACEMENT  9/12   nephrolithaisis  . HYSTEROSCOPY W/D&C  7/09   PMP with endometrial polyp  . REPLACEMENT TOTAL KNEE Bilateral 1998, 2001      .  TONSILLECTOMY AND ADENOIDECTOMY      Current Outpatient Medications  Medication Sig Dispense Refill  . atorvastatin (LIPITOR) 40 MG tablet Take 40 mg by mouth daily. Reported on 01/02/2016  0  . COUMADIN 2 MG tablet Take 1-2 mg by mouth daily. Take 2mg s daily on mondays and take 1mg  on all other days of the week  0  . digoxin (LANOXIN) 0.25 MG tablet Take 0.25 mg by mouth daily.    Marland Kitchen levothyroxine (SYNTHROID, LEVOTHROID) 125 MCG tablet Take 125 mcg by mouth daily before breakfast.    . meclizine (ANTIVERT) 25 MG tablet Take 1 tablet (25 mg total) by mouth 3 (three) times daily as needed for dizziness. 30 tablet 0  . metoprolol succinate (TOPROL-XL) 100 MG 24 hr tablet Take 100 mg by mouth daily.  0   No current facility-administered medications for this visit.     Family History  Problem Relation Age of Onset  . Hypertension Father   . Heart disease Father   . Hypertension Mother   . Hyperlipidemia Mother   . Heart attack Mother   . Prostate cancer Brother     Review of Systems  Genitourinary: Positive for frequency.  All other systems reviewed and are negative.   Exam:   BP 136/80 (BP Location: Left Arm, Patient Position: Sitting, Cuff Size: Large)   Pulse 80   Resp 16   Ht 5' 1.75" (1.568 m)   Wt 240 lb 6.4 oz (109 kg)   LMP 01/22/2011   BMI 44.33 kg/m   Height: 5' 1.75" (156.8 cm)  Ht Readings from Last 3 Encounters:  01/23/18 5' 1.75" (1.568 m)  05/30/17 5\' 1"  (1.549 m)  03/08/17 5\' 1"  (1.549 m)    General appearance: alert, cooperative and appears stated age Head: Normocephalic, without obvious abnormality, atraumatic Neck: no adenopathy, supple, symmetrical, trachea midline and thyroid normal to inspection and palpation Lungs: clear to auscultation bilaterally Breasts: normal appearance, no masses or tenderness Heart: regular rate and rhythm Abdomen: soft, non-tender; bowel sounds normal; no masses,  no organomegaly Extremities: thickened and mottled skin,  purplish c/w stasis Skin: Skin color, texture, turgor normal. No rashes or lesions Lymph nodes: Cervical, supraclavicular, and axillary nodes normal. No abnormal inguinal nodes palpated Neurologic: Grossly normal  Pelvic: External genitalia:  no lesions              Urethra:  normal appearing urethra with no masses, tenderness or lesions              Bartholins and Skenes: normal                 Vagina: normal appearing vagina with normal color and discharge, no lesions              Cervix: no lesions              Pap taken: No. Bimanual Exam:  Uterus:  normal size, contour, position, consistency, mobility, non-tender              Adnexa: normal adnexa and no mass, fullness, tenderness               Rectovaginal: Confirms               Anus:  normal sphincter tone, no lesions  Chaperone was present for exam.  A:  Well Woman with normal exam PMP, no HRT H/O PE (when on HRT) LE stasis Severe OSA Hypothyroidism H/O elevated lipids H/O SCC  P:   Mammogram guidelines reviewed.  Pt desires to continue mamogram pap smear not indicated Lab work done with Dr. Sharlett Iles Vaccinations are UTD Discussed with pt importance of CPAP as well.  States she promises to Call Dr. Cathren Laine office to work on having CPAP fitting done. Return annually or prn

## 2018-02-27 ENCOUNTER — Encounter: Payer: Self-pay | Admitting: Adult Health

## 2018-02-27 ENCOUNTER — Ambulatory Visit: Payer: Medicare Other | Admitting: Adult Health

## 2018-02-27 ENCOUNTER — Other Ambulatory Visit: Payer: Self-pay | Admitting: Neurology

## 2018-02-27 VITALS — BP 140/73 | HR 64 | Ht 61.1 in | Wt 239.0 lb

## 2018-02-27 DIAGNOSIS — G4733 Obstructive sleep apnea (adult) (pediatric): Secondary | ICD-10-CM | POA: Diagnosis not present

## 2018-02-27 NOTE — Patient Instructions (Signed)
Your Plan:  Start using CPAP nightly and >4 hours each night If your symptoms worsen or you develop new symptoms please let us know.   Thank you for coming to see [name] at [facility]. I hope we have been able to provide you high quality care today.  You may receive a patient satisfaction survey over the next few weeks. We would appreciate your feedback and comments so that we may continue to improve ourselves and the health of our patients.

## 2018-02-27 NOTE — Progress Notes (Addendum)
PATIENT: Katrina [name] DOB: May 06, 1932  REASON FOR VISIT: follow up HISTORY FROM: patient  HISTORY OF PRESENT ILLNESS: Today 02/27/18:  Ms. Blankenbeckler is an 84 year old female with a history of effective sleep apnea on CPAP.  She returns today for follow-up.  The patient has not used a CPAP.  She reports that she has actually not taking it out of the box since she received the machine 1 year ago.  She has multiple reasons for not using it such as vertigo and suffering a fall.  She does state that her primary care has been encouraging her to use the CPAP machine.  She states that she would like to start using the CPAP machine if her DME company provided supplies.  HISTORY 03/08/2017 (all dictated new, as well as above notes, some dictation done in note pad or Word, outside of chart, may appear as copied): She reports that she had her CPAP machine set up but has been struggling with intermittent dizziness and vertigo spells. She is agreeable to starting CPAP therapy. We talked about her test results in detail. She had no recent medication changes. She did have one recent fall, fell into her [place]a bush, thankfully no injury but did have a sore spot on her head, no loss of consciousness, no sustained headaches. She tries to hydrate, she uses a 4 point cane to walk, she tries to change position slowly and has not had any recent medication changes. She does endorse nocturia about twice per average night and daytime somnolence, has a tendency to fall asleep sitting in her recliner.    REVIEW OF SYSTEMS: Out of a complete 14 system review of symptoms, the patient complains only of the following symptoms, and all other reviewed systems are negative.  See HPI   ALLERGIES: Allergies  Allergen Reactions  . Penicillins     Mother and brother have allergy, patient just does not take this Did PCN reaction causing immediate rash, facial/tongue/throat swelling, SOB or lightheadedness with hypotension:  unknown Did PCN reaction causing severe rash involving mucus membranes or skin necrosis:unknown Did PCN reaction that required hospitalization : unknown Did PCN reaction occurring within the last 10 years: unknown Pt never actually took med. Did okay with ampicillin    HOME MEDICATIONS: Outpatient Medications Prior to Visit  Medication Sig Dispense Refill  . atorvastatin (LIPITOR) 40 MG tablet Take 40 mg by mouth daily. Reported on 01/02/2016  0  . COUMADIN 2 MG tablet Take 1-2 mg by mouth daily. Take 2mg s daily on mondays and take 1mg  on all other days of the week  0  . digoxin (LANOXIN) 0.25 MG tablet Take 0.25 mg by mouth daily.    Marland Kitchen levothyroxine (SYNTHROID, LEVOTHROID) 125 MCG tablet Take 125 mcg by mouth daily before breakfast.    . metoprolol succinate (TOPROL-XL) 100 MG 24 hr tablet Take 100 mg by mouth daily.  0  . meclizine (ANTIVERT) 25 MG tablet Take 1 tablet (25 mg total) by mouth 3 (three) times daily as needed for dizziness. 30 tablet 0   No facility-administered medications prior to visit.     PAST MEDICAL HISTORY: Past Medical History:  Diagnosis Date  . Atrial fibrillation ([place])   . DVT (deep venous thrombosis) ([place])   . Elevated lipids   . Gallstone   . Hypertension   . Hypothyroidism   . Nephrolithiasis   . Overactive bladder   . Pulmonary emboli ([facility])    while on HRT  . SCC (squamous  cell carcinoma)    of left leg  . Sleep apnea    on C-pap  . Vertigo     PAST SURGICAL HISTORY: Past Surgical History:  Procedure Laterality Date  . CHOLECYSTECTOMY     Dr. Zenia Resides  . CYSTOSCOPY WITH STENT PLACEMENT  9/12   nephrolithaisis  . HYSTEROSCOPY W/D&C  7/09   PMP with endometrial polyp  . REPLACEMENT TOTAL KNEE Bilateral 1998, 2001      . TONSILLECTOMY AND ADENOIDECTOMY      FAMILY HISTORY: Family History  Problem Relation Age of Onset  . Hypertension Father   . Heart disease Father   . Hypertension Mother   . Hyperlipidemia Mother   . Heart attack  Mother   . Prostate cancer Brother     SOCIAL HISTORY: Social History   Socioeconomic History  . Marital status: Single    Spouse name: Not on file  . Number of children: Not on file  . Years of education: Not on file  . Highest education level: Not on file  Occupational History  . Not on file  Social Needs  . Financial resource strain: Not on file  . Food insecurity:    Worry: Not on file    Inability: Not on file  . Transportation needs:    Medical: Not on file    Non-medical: Not on file  Tobacco Use  . Smoking status: Never Smoker  . Smokeless tobacco: Never Used  Substance and Sexual Activity  . Alcohol use: No    Alcohol/week: 0.0 oz  . Drug use: No  . Sexual activity: Not Currently    Partners: Male    Birth control/protection: Post-menopausal  Lifestyle  . Physical activity:    Days per week: Not on file    Minutes per session: Not on file  . Stress: Not on file  Relationships  . Social connections:    Talks on phone: Not on file    Gets together: Not on file    Attends religious service: Not on file    Active member of club or organization: Not on file    Attends meetings of clubs or organizations: Not on file    Relationship status: Not on file  . Intimate partner violence:    Fear of current or ex partner: Not on file    Emotionally abused: Not on file    Physically abused: Not on file    Forced sexual activity: Not on file  Other Topics Concern  . Not on file  Social History Narrative  . Not on file      PHYSICAL EXAM  Vitals:   02/27/18 1119  BP: 140/73  Pulse: 64  Weight: 239 lb (108.4 kg)  Height: 5' 1.1" (1.552 m)   Body mass index is 45.01 kg/m.  Generalized: Well developed, in no acute distress   Neurological examination  Mentation: Alert oriented to time, place, history taking. Follows all commands speech and language fluent Cranial nerve II-XII: Pupils were equal round reactive to light. Extraocular movements were full,  visual field were full on confrontational test. Facial sensation and strength were normal. Uvula tongue midline. Head turning and shoulder shrug  were normal and symmetric. Motor: The motor testing reveals 5 over 5 strength of all 4 extremities. Good symmetric motor tone is noted throughout.  Sensory: Sensory testing is intact to soft touch on all 4 extremities. No evidence of extinction is noted.  Coordination: Cerebellar testing reveals good finger-nose-finger and heel-to-shin bilaterally.  Gait and station: Gait is normal. Tandem gait is normal. Romberg is negative. No drift is seen.  Reflexes: Deep tendon reflexes are symmetric and normal bilaterally.   DIAGNOSTIC DATA (LABS, IMAGING, TESTING) - I reviewed patient records, labs, notes, testing and imaging myself where available.  Lab Results  Component Value Date   WBC 7.9 06/10/2016   HGB 12.3 06/10/2016   HCT 37.9 06/10/2016   MCV 89.6 06/10/2016   PLT 146 (L) 06/10/2016      Component Value Date/Time   NA 138 06/10/2016 1023   K 4.0 06/10/2016 1023   CL 108 06/10/2016 1023   CO2 26 06/10/2016 1023   GLUCOSE 135 (H) 06/10/2016 1023   BUN 14 06/10/2016 1023   CREATININE 0.69 06/10/2016 1023   CALCIUM 8.4 (L) 06/10/2016 1023   PROT 8.2 04/23/2010 1315   ALBUMIN 3.5 04/23/2010 1315   AST 34 04/23/2010 1315   ALT 22 04/23/2010 1315   ALKPHOS 107 04/23/2010 1315   BILITOT 0.7 04/23/2010 1315   GFRNONAA >60 06/10/2016 1023   GFRAA >60 06/10/2016 1023      ASSESSMENT AND PLAN 84 y.o. year old female  has a past medical history of Atrial fibrillation ([place]), DVT (deep venous thrombosis) ([place]), Elevated lipids, Gallstone, Hypertension, Hypothyroidism, Nephrolithiasis, Overactive bladder, Pulmonary emboli ([place]), SCC (squamous cell carcinoma), Sleep apnea, and Vertigo. here with:  1.  Obstructive sleep apnea  I had a long discussion with the patient about the important of treating sleep apnea.  I went over the risk associated  with untreated sleep apnea.  I advised the patient that her DME company would require her to start the process over which would require her to repeat a sleep study since she has not used the CPAP machine.  She voiced understanding.  She states that she does not want to schedule a sleep study at this time.  She will call back when she is ready to proceed.  I spent 15 minutes with the patient. 50% of this time was spent discussing sleep apnea   Ward Givens, MSN, NP-C 02/27/2018, 11:33 AM [facility] [address] [place], [place] [address] [phone]  I reviewed the above note and documentation by the Nurse Practitioner and agree with the history, physical exam, assessment and plan as outlined above. I was immediately available for face-to-face consultation. [name], MD, PhD [facility])

## 2018-04-14 ENCOUNTER — Encounter: Payer: Self-pay | Admitting: Podiatry

## 2018-04-14 ENCOUNTER — Ambulatory Visit: Payer: Medicare Other | Admitting: Podiatry

## 2018-04-14 DIAGNOSIS — I739 Peripheral vascular disease, unspecified: Secondary | ICD-10-CM

## 2018-04-14 DIAGNOSIS — I890 Lymphedema, not elsewhere classified: Secondary | ICD-10-CM

## 2018-04-14 DIAGNOSIS — M79675 Pain in left toe(s): Secondary | ICD-10-CM

## 2018-04-14 DIAGNOSIS — B351 Tinea unguium: Secondary | ICD-10-CM

## 2018-04-14 DIAGNOSIS — D689 Coagulation defect, unspecified: Secondary | ICD-10-CM | POA: Diagnosis not present

## 2018-04-14 DIAGNOSIS — M79674 Pain in right toe(s): Secondary | ICD-10-CM

## 2018-04-14 NOTE — Progress Notes (Signed)
Complaint:  Visit Type: Patient returns to my office for continued preventative foot care services. Complaint: Patient states" my nails have grown long and thick and become painful to walk and wear shoes" Patient has been taking coumadin.. The patient presents for preventative foot care services. No changes to ROS.  Previously had surgery by Dr.  Amalia Hailey.  Podiatric Exam: Vascular: dorsalis pedis  pulses are palpable bilateral. Posterior tibial pulses are not palpable  B/l.  Venous stasis  Legs  B/L. Capillary return is immediate. Temperature gradient is WNL. Skin turgor WNL  Sensorium: Normal Semmes Weinstein monofilament test. Normal tactile sensation bilaterally. Nail Exam: Pt has thick disfigured discolored nails with subungual debris noted bilateral entire nail hallux through fifth toenails Ulcer Exam: There is no evidence of ulcer or pre-ulcerative changes or infection. Orthopedic Exam: Muscle tone and strength are WNL. No limitations in general ROM. No crepitus or effusions noted. Foot type and digits show no abnormalities. HAV  B/L. Skin: No Porokeratosis. No infection or ulcers.  Callus over dorsomedial exostosis right foot..  Diagnosis:  Onychomycosis, , Pain in right toe, pain in left toes  Treatment & Plan Procedures and Treatment: Consent by patient was obtained for treatment procedures.   Debridement of mycotic and hypertrophic toenails, 1 through 5 bilateral and clearing of subungual debris. No ulceration, no infection noted.  Return Visit-Office Procedure: Patient instructed to return to the office for a follow up visit 3 months for continued evaluation and treatment.    Gardiner Barefoot DPM

## 2019-01-04 DIAGNOSIS — I480 Paroxysmal atrial fibrillation: Secondary | ICD-10-CM | POA: Insufficient documentation

## 2019-01-23 ENCOUNTER — Ambulatory Visit: Payer: Medicare Other | Admitting: Podiatry

## 2019-01-23 ENCOUNTER — Encounter: Payer: Self-pay | Admitting: Podiatry

## 2019-01-23 ENCOUNTER — Other Ambulatory Visit: Payer: Self-pay

## 2019-01-23 VITALS — Temp 98.2°F

## 2019-01-23 DIAGNOSIS — L84 Corns and callosities: Secondary | ICD-10-CM | POA: Diagnosis not present

## 2019-01-23 DIAGNOSIS — D689 Coagulation defect, unspecified: Secondary | ICD-10-CM | POA: Diagnosis not present

## 2019-01-23 DIAGNOSIS — B351 Tinea unguium: Secondary | ICD-10-CM | POA: Diagnosis not present

## 2019-01-23 DIAGNOSIS — M79674 Pain in right toe(s): Secondary | ICD-10-CM | POA: Diagnosis not present

## 2019-01-23 DIAGNOSIS — I890 Lymphedema, not elsewhere classified: Secondary | ICD-10-CM | POA: Diagnosis not present

## 2019-01-23 DIAGNOSIS — M79675 Pain in left toe(s): Secondary | ICD-10-CM

## 2019-01-23 DIAGNOSIS — I739 Peripheral vascular disease, unspecified: Secondary | ICD-10-CM | POA: Diagnosis not present

## 2019-01-23 NOTE — Progress Notes (Signed)
Complaint:  Visit Type: Patient returns to my office for continued preventative foot care services. Complaint: Patient states" my nails have grown long and thick and become painful to walk and wear shoes" Patient has been taking coumadin.. The patient presents for preventative foot care services. No changes to ROS.  Previously had surgery by Dr.  Amalia Hailey.  Patient has painful callus right foot at site of bunion.  Podiatric Exam: Vascular: dorsalis pedis  pulses are not  palpable bilateral due to swelling from lymphedema  Posterior tibial pulses are not palpable  B/l.  Venous stasis  Legs  B/L. Capillary return is immediate. Temperature gradient is WNL. Skin turgor WNL  Sensorium: Normal Semmes Weinstein monofilament test. Normal tactile sensation bilaterally. Nail Exam: Pt has thick disfigured discolored nails with subungual debris noted bilateral entire nail hallux through fifth toenails Ulcer Exam: There is no evidence of ulcer or pre-ulcerative changes or infection. Orthopedic Exam: Muscle tone and strength are WNL. No limitations in general ROM. No crepitus or effusions noted. Foot type and digits show no abnormalities. HAV  B/L. Skin: No Porokeratosis. No infection or ulcers.  Callus over dorsomedial exostosis right foot.. Crusty legs/feet  B/l. Black skin necrosis multiple digits left foot.  Diagnosis:  Onychomycosis, , Pain in right toe, pain in left toes  Callus right foot.  PVD  Treatment & Plan Procedures and Treatment: Consent by patient was obtained for treatment procedures.   Debridement of mycotic and hypertrophic toenails, 1 through 5 bilateral and clearing of subungual debris. No ulceration, no infection noted. Patient has smaill blood blister proximal nail fold right hallux ,she desires to watch and have no treatment. Return Visit-Office Procedure: Patient instructed to return to the office for a follow up visit 3 months for continued evaluation and treatment.    Gardiner Barefoot  DPM

## 2019-05-17 NOTE — Progress Notes (Signed)
85 y.o. G0P0000 Single White or Caucasian female here for annual exam.  Only living brother died in February 28, 2023.  He was very sick due to hx of prostate cancer and bladder cancer.  He'd undergone a bladder removal and kidney removal.  There has not been a [employer] yet.  She would like that to happen.    Denies vaginal bleeding.    Having continued issues with vertigo.  Did have a visit with Dr. Sharlett Iles in Feb 28, 2023.  Had blood work prior to phone appt.  Saw Dr. Jeffie Pollock in July.  Reports kidney function is stable.  Does have PT/INR checked every month.    Patient's last menstrual period was 01/22/2011.          Sexually active: No.  The current method of family planning is post menopausal status.    Exercising: No Smoker:  no  Health Maintenance: Pap:  11/14/14 neg History of abnormal Pap:  no MMG:  10/26/17 BIRADS2:Benign.  Is planning on doing this before the end of the year.   Colonoscopy:  02/07/07 normal  BMD:   10/12/16 normal TDaP:  2013 Pneumonia vaccine(s):  Done Shingrix:  Discussed with pt today Hep C testing: n/a Screening Labs: Done in July   reports that she has never smoked. She has never used smokeless tobacco. She reports that she does not drink alcohol or use drugs.  Past Medical History:  Diagnosis Date  . Atrial fibrillation ([place])   . DVT (deep venous thrombosis) ([place])   . Elevated lipids   . Gallstone   . Hypertension   . Hypothyroidism   . Nephrolithiasis   . Overactive bladder   . Pulmonary emboli ([facility])    while on HRT  . SCC (squamous cell carcinoma)    of left leg  . Sleep apnea    on C-pap  . Vertigo     Past Surgical History:  Procedure Laterality Date  . CHOLECYSTECTOMY     Dr. Zenia Resides  . CYSTOSCOPY WITH STENT PLACEMENT  9/12   nephrolithaisis  . HYSTEROSCOPY W/D&C  7/09   PMP with endometrial polyp  . REPLACEMENT TOTAL KNEE Bilateral 1998, 2001      . TONSILLECTOMY AND ADENOIDECTOMY      Current Outpatient Medications  Medication Sig  Dispense Refill  . atorvastatin (LIPITOR) 40 MG tablet Take 40 mg by mouth daily. Reported on 01/02/2016  0  . COUMADIN 2 MG tablet Take 1-2 mg by mouth daily. Take 2mg s daily on mondays and take 1mg  on all other days of the week  0  . digoxin (LANOXIN) 0.25 MG tablet Take 0.25 mg by mouth daily.    Marland Kitchen levothyroxine (SYNTHROID, LEVOTHROID) 125 MCG tablet Take 125 mcg by mouth daily before breakfast.    . metoprolol succinate (TOPROL-XL) 100 MG 24 hr tablet Take 100 mg by mouth daily.  0   No current facility-administered medications for this visit.     Family History  Problem Relation Age of Onset  . Hypertension Father   . Heart disease Father   . Hypertension Mother   . Hyperlipidemia Mother   . Heart attack Mother   . Prostate cancer Brother     Review of Systems  All other systems reviewed and are negative.   Exam:   BP 128/76   Pulse 60   Temp (!) 97.4 F (36.3 C) (Temporal)   Ht 5\' 1"  (1.549 m)   Wt 222 lb 3.2 oz (100.8 kg)   LMP 01/22/2011  BMI 41.98 kg/m   Weight:  -18#   Height: 5\' 1"  (154.9 cm)  Ht Readings from Last 3 Encounters:  05/18/19 5\' 1"  (1.549 m)  02/27/18 5' 1.1" (1.552 m)  01/23/18 5' 1.75" (1.568 m)    General appearance: alert, cooperative and appears stated age Head: Normocephalic, without obvious abnormality, atraumatic Neck: no adenopathy, supple, symmetrical, trachea midline and thyroid normal to inspection and palpation Lungs: clear to auscultation bilaterally Breasts: normal appearance, no masses or tenderness Heart: regular rate and rhythm Abdomen: soft, non-tender; bowel sounds normal; no masses,  no organomegaly Extremities: extremities normal, atraumatic, no cyanosis or edema Skin: Skin color, texture, turgor normal. No rashes or lesions Lymph nodes: Cervical, supraclavicular, and axillary nodes normal. No abnormal inguinal nodes palpated Neurologic: Grossly normal   Pelvic: External genitalia:  no lesions              Urethra:   normal appearing urethra with no masses, tenderness or lesions              Bartholins and Skenes: normal                 Vagina: normal appearing vagina with normal color and discharge, no lesions              Cervix: no lesions              Pap taken: No. Bimanual Exam:  Uterus:  normal size, contour, position, consistency, mobility, non-tender              Adnexa: normal adnexa and no mass, fullness, tenderness               Rectovaginal: Confirms               Anus:  normal sphincter tone, no lesions  Chaperone was present for exam.  A:  Well Woman with normal exam PMP, no HRT H/O PE (when on HRT in the past) LE stasis Severe OSA Hypothyroidism Elevated lipids H/o SCC  P:   Mammogram guidelines reviewed.  Pt is going to have it done next March. pap smear not indicated Lab work is UTD Declines shingrix vaccination BMD normal 2018 return annually or prn

## 2019-05-18 ENCOUNTER — Other Ambulatory Visit: Payer: Self-pay

## 2019-05-18 ENCOUNTER — Ambulatory Visit (INDEPENDENT_AMBULATORY_CARE_PROVIDER_SITE_OTHER): Payer: Medicare Other | Admitting: Obstetrics & Gynecology

## 2019-05-18 ENCOUNTER — Encounter: Payer: Self-pay | Admitting: Obstetrics & Gynecology

## 2019-05-18 VITALS — BP 128/76 | HR 60 | Temp 97.4°F | Ht 61.0 in | Wt 222.2 lb

## 2019-05-18 DIAGNOSIS — Z01419 Encounter for gynecological examination (general) (routine) without abnormal findings: Secondary | ICD-10-CM | POA: Diagnosis not present

## 2019-10-02 ENCOUNTER — Ambulatory Visit: Payer: Medicare Other | Admitting: Podiatry

## 2019-10-02 ENCOUNTER — Encounter: Payer: Self-pay | Admitting: Podiatry

## 2019-10-02 ENCOUNTER — Other Ambulatory Visit: Payer: Self-pay

## 2019-10-02 DIAGNOSIS — B351 Tinea unguium: Secondary | ICD-10-CM

## 2019-10-02 DIAGNOSIS — I739 Peripheral vascular disease, unspecified: Secondary | ICD-10-CM

## 2019-10-02 DIAGNOSIS — M79675 Pain in left toe(s): Secondary | ICD-10-CM | POA: Diagnosis not present

## 2019-10-02 DIAGNOSIS — M79674 Pain in right toe(s): Secondary | ICD-10-CM | POA: Diagnosis not present

## 2019-10-02 NOTE — Progress Notes (Signed)
Complaint:  Visit Type: Patient returns to my office for continued preventative foot care services. Complaint: Patient states" my nails have grown long and thick and become painful to walk and wear shoes" Patient has been taking coumadin.. The patient presents for preventative foot care services. No changes to ROS.  Previously had surgery by Dr.  Amalia Hailey.  Patient has painful callus right foot at site of bunion.  Podiatric Exam: Vascular: dorsalis pedis  pulses are not  palpable bilateral due to swelling from lymphedema  Posterior tibial pulses are not palpable  B/l.  Venous stasis  Legs  B/L. Capillary return is immediate. Temperature gradient is WNL. Skin turgor WNL  Sensorium: Normal Semmes Weinstein monofilament test. Normal tactile sensation bilaterally. Nail Exam: Pt has thick disfigured discolored nails with subungual debris noted bilateral entire nail hallux through fifth toenails Ulcer Exam: There is no evidence of ulcer or pre-ulcerative changes or infection. Orthopedic Exam: Muscle tone and strength are WNL. No limitations in general ROM. No crepitus or effusions noted. Foot type and digits show no abnormalities. HAV  B/L. Skin: No Porokeratosis. No infection or ulcers.  Callus over dorsomedial exostosis right foot.. Crusty legs/feet  B/l. Black skin necrosis multiple digits left foot.  Diagnosis:  Onychomycosis, , Pain in right toe, pain in left toes    PVD  Treatment & Plan Procedures and Treatment: Consent by patient was obtained for treatment procedures.   Debridement of mycotic and hypertrophic toenails, 1 through 5 bilateral and clearing of subungual debris.  Dremel was used to reduce callus right foot. Return Visit-Office Procedure: Patient instructed to return to the office for a follow up visit 3 months for continued evaluation and treatment.    Gardiner Barefoot DPM

## 2019-11-08 DIAGNOSIS — L089 Local infection of the skin and subcutaneous tissue, unspecified: Secondary | ICD-10-CM | POA: Insufficient documentation

## 2019-11-08 DIAGNOSIS — R1031 Right lower quadrant pain: Secondary | ICD-10-CM | POA: Insufficient documentation

## 2020-03-09 ENCOUNTER — Emergency Department (HOSPITAL_COMMUNITY): Payer: Medicare Other

## 2020-03-09 ENCOUNTER — Encounter (HOSPITAL_COMMUNITY): Payer: Self-pay

## 2020-03-09 ENCOUNTER — Emergency Department (HOSPITAL_COMMUNITY)
Admission: EM | Admit: 2020-03-09 | Discharge: 2020-03-09 | Disposition: A | Discharge status: Home / Self Care | Payer: Medicare Other | Attending: Emergency Medicine | Admitting: Emergency Medicine

## 2020-03-09 ENCOUNTER — Other Ambulatory Visit: Payer: Self-pay

## 2020-03-09 DIAGNOSIS — W06XXXA Fall from bed, initial encounter: Secondary | ICD-10-CM | POA: Diagnosis not present

## 2020-03-09 DIAGNOSIS — Y9384 Activity, sleeping: Secondary | ICD-10-CM | POA: Insufficient documentation

## 2020-03-09 DIAGNOSIS — Z79899 Other long term (current) drug therapy: Secondary | ICD-10-CM | POA: Diagnosis not present

## 2020-03-09 DIAGNOSIS — W19XXXA Unspecified fall, initial encounter: Secondary | ICD-10-CM

## 2020-03-09 DIAGNOSIS — Z7901 Long term (current) use of anticoagulants: Secondary | ICD-10-CM | POA: Diagnosis not present

## 2020-03-09 DIAGNOSIS — Y998 Other external cause status: Secondary | ICD-10-CM | POA: Insufficient documentation

## 2020-03-09 DIAGNOSIS — Z96651 Presence of right artificial knee joint: Secondary | ICD-10-CM | POA: Diagnosis not present

## 2020-03-09 DIAGNOSIS — R748 Abnormal levels of other serum enzymes: Secondary | ICD-10-CM

## 2020-03-09 DIAGNOSIS — S4991XA Unspecified injury of right shoulder and upper arm, initial encounter: Secondary | ICD-10-CM | POA: Diagnosis present

## 2020-03-09 DIAGNOSIS — Z96652 Presence of left artificial knee joint: Secondary | ICD-10-CM | POA: Diagnosis not present

## 2020-03-09 DIAGNOSIS — R519 Headache, unspecified: Secondary | ICD-10-CM | POA: Insufficient documentation

## 2020-03-09 DIAGNOSIS — E860 Dehydration: Secondary | ICD-10-CM | POA: Insufficient documentation

## 2020-03-09 DIAGNOSIS — S40011A Contusion of right shoulder, initial encounter: Secondary | ICD-10-CM | POA: Insufficient documentation

## 2020-03-09 DIAGNOSIS — E039 Hypothyroidism, unspecified: Secondary | ICD-10-CM | POA: Insufficient documentation

## 2020-03-09 DIAGNOSIS — I10 Essential (primary) hypertension: Secondary | ICD-10-CM | POA: Diagnosis not present

## 2020-03-09 DIAGNOSIS — Y92013 Bedroom of single-family (private) house as the place of occurrence of the external cause: Secondary | ICD-10-CM | POA: Insufficient documentation

## 2020-03-09 LAB — CBC WITH DIFFERENTIAL/PLATELET
Abs Immature Granulocytes: 0.03 10*3/uL (ref 0.00–0.07)
Basophils Absolute: 0 10*3/uL (ref 0.0–0.1)
Basophils Relative: 0 %
Eosinophils Absolute: 0.1 10*3/uL (ref 0.0–0.5)
Eosinophils Relative: 1 %
HCT: 41.3 % (ref 36.0–46.0)
Hemoglobin: 12.7 g/dL (ref 12.0–15.0)
Immature Granulocytes: 1 %
Lymphocytes Relative: 15 %
Lymphs Abs: 0.9 10*3/uL (ref 0.7–4.0)
MCH: 28.1 pg (ref 26.0–34.0)
MCHC: 30.8 g/dL (ref 30.0–36.0)
MCV: 91.4 fL (ref 80.0–100.0)
Monocytes Absolute: 0.5 10*3/uL (ref 0.1–1.0)
Monocytes Relative: 7 %
Neutro Abs: 4.7 10*3/uL (ref 1.7–7.7)
Neutrophils Relative %: 76 %
Platelets: 109 10*3/uL — ABNORMAL LOW (ref 150–400)
RBC: 4.52 MIL/uL (ref 3.87–5.11)
RDW: 16.1 % — ABNORMAL HIGH (ref 11.5–15.5)
WBC: 6.2 10*3/uL (ref 4.0–10.5)
nRBC: 0 % (ref 0.0–0.2)

## 2020-03-09 LAB — CBG MONITORING, ED: Glucose-Capillary: 104 mg/dL — ABNORMAL HIGH (ref 70–99)

## 2020-03-09 LAB — CK
Total CK: 1037 U/L — ABNORMAL HIGH (ref 38–234)
Total CK: 855 U/L — ABNORMAL HIGH (ref 38–234)

## 2020-03-09 LAB — COMPREHENSIVE METABOLIC PANEL
ALT: 41 U/L (ref 0–44)
AST: 108 U/L — ABNORMAL HIGH (ref 15–41)
Albumin: 2.9 g/dL — ABNORMAL LOW (ref 3.5–5.0)
Alkaline Phosphatase: 91 U/L (ref 38–126)
Anion gap: 12 (ref 5–15)
BUN: 15 mg/dL (ref 8–23)
CO2: 21 mmol/L — ABNORMAL LOW (ref 22–32)
Calcium: 8.3 mg/dL — ABNORMAL LOW (ref 8.9–10.3)
Chloride: 106 mmol/L (ref 98–111)
Creatinine, Ser: 0.73 mg/dL (ref 0.44–1.00)
GFR calc Af Amer: >60 mL/min (ref >60)
GFR calc non Af Amer: >60 mL/min (ref >60)
Glucose, Bld: 125 mg/dL — ABNORMAL HIGH (ref 70–99)
Potassium: 3.5 mmol/L (ref 3.5–5.1)
Sodium: 139 mmol/L (ref 135–145)
Total Bilirubin: 3.2 mg/dL — ABNORMAL HIGH (ref 0.3–1.2)
Total Protein: 8.1 g/dL (ref 6.5–8.1)

## 2020-03-09 LAB — PROTIME-INR
INR: 1.6 — ABNORMAL HIGH (ref 0.8–1.2)
Prothrombin Time: 18.3 seconds — ABNORMAL HIGH (ref 11.4–15.2)

## 2020-03-09 LAB — LACTIC ACID, PLASMA
Lactic Acid, Venous: 2.3 mmol/L (ref 0.5–1.9)
Lactic Acid, Venous: 1.4 mmol/L (ref 0.5–1.9)

## 2020-03-09 MED ORDER — SODIUM CHLORIDE 0.9 % IV BOLUS
1000.0000 mL | Freq: Once | INTRAVENOUS | Status: AC
  Administered 2020-03-09: 1000 mL via INTRAVENOUS

## 2020-03-09 MED ORDER — LACTATED RINGERS IV BOLUS
1000.0000 mL | Freq: Once | INTRAVENOUS | Status: AC
  Administered 2020-03-09: 1000 mL via INTRAVENOUS

## 2020-03-09 NOTE — ED Notes (Signed)
Patient transported to CT 

## 2020-03-09 NOTE — ED Triage Notes (Signed)
Ems called out for wellfare check. Found pt wedged on the side of the bed X3 days. Has not been able to move. Pt is on blood thinner. No obvious injuries. C/o generalized fatigue. HR 150-160. Hx afib. Given 250cc NS bolus en route. 20g LAC.  129/95 96% RA CBG 114

## 2020-03-09 NOTE — ED Notes (Signed)
Katrina [name] [phone] call when/if she needs a ride home

## 2020-03-09 NOTE — ED Notes (Signed)
Updated EDP on improved hr around 100 after LR bolus.

## 2020-03-09 NOTE — ED Notes (Signed)
Reached out to PA about this pt.

## 2020-03-09 NOTE — ED Notes (Signed)
Pt's CBG result was 104. Informed Elizabeth - RN.

## 2020-03-09 NOTE — ED Provider Notes (Signed)
[facility] Provider Note   CSN: [id number] Arrival date & time: 03/09/20  1708     History Chief Complaint  Patient presents with  . Fall    Katrina [name] is a 86 y.o. female.  HPI     86yo female with history of atrial fibrillation, DVT, PE on coumadin, hypertension, hypothyroidism presents with concern for being found down after fall 2 days ago.   Thursday had INR checked, woke up Friday morning on the floor, thinks had dream and rolled out of bed and got stuck between bed and the wall Got to the foot of the bed but could not get feet on the floor Has not had anything to eat or drink, was stuck on the floor and not able to get in touch with anyone  Some headache, did hit head   No smoking, drinking, drugs Neighbors help check in on her   Past Medical History:  Diagnosis Date  . Atrial fibrillation ([place])   . DVT (deep venous thrombosis) ([place])   . Elevated lipids   . Gallstone   . Hypertension   . Hypothyroidism   . Nephrolithiasis   . Overactive bladder   . Pulmonary emboli ([facility])    while on HRT  . SCC (squamous cell carcinoma)    of left leg  . Sleep apnea    on C-pap  . Vertigo     Patient Active Problem List   Diagnosis Date Noted  . Sleep apnea 01/10/2017  . Hypertension 01/10/2017  . Hypothyroidism 01/10/2017  . Overactive bladder 01/10/2017  . Vertigo 01/10/2017  . Elevated lipids 01/10/2017    Past Surgical History:  Procedure Laterality Date  . CHOLECYSTECTOMY     Dr. Zenia Resides  . CYSTOSCOPY WITH STENT PLACEMENT  9/12   nephrolithaisis  . HYSTEROSCOPY WITH D & C  7/09   PMP with endometrial polyp  . REPLACEMENT TOTAL KNEE Bilateral 1998, 2001      . TONSILLECTOMY AND ADENOIDECTOMY       OB History    Gravida  0   Para  0   Term  0   Preterm  0   AB  0   Living  0     SAB  0   TAB  0   Ectopic  0   Multiple  0   Live Births              Family History  Problem Relation  Age of Onset  . Hypertension Father   . Heart disease Father   . Hypertension Mother   . Hyperlipidemia Mother   . Heart attack Mother   . Prostate cancer Brother     Social History   Tobacco Use  . Smoking status: Never Smoker  . Smokeless tobacco: Never Used  Vaping Use  . Vaping Use: Never used  Substance Use Topics  . Alcohol use: No    Alcohol/week: 0.0 standard drinks  . Drug use: No    Home Medications Prior to Admission medications   Medication Sig Start Date End Date Taking? Authorizing Provider  atorvastatin (LIPITOR) 40 MG tablet Take 40 mg by mouth daily.  09/11/15  Yes [provider]  digoxin (LANOXIN) 0.25 MG tablet Take 0.25 mg by mouth daily.   Yes [provider]  levothyroxine (SYNTHROID, LEVOTHROID) 125 MCG tablet Take 125 mcg by mouth daily before breakfast.   Yes [provider]  metoprolol succinate (TOPROL-XL) 100 MG 24  hr tablet Take 100 mg by mouth daily. 11/06/14  Yes [provider]  warfarin (COUMADIN) 1 MG tablet Take 1 mg by mouth daily at 4 PM.  06/08/19  Yes [provider]    Allergies    Penicillins and Penicillin g  Review of Systems   Review of Systems  Constitutional: Positive for fatigue. Negative for fever.  HENT: Negative for sore throat. Congestion: chronic.   Eyes: Negative for visual disturbance.  Respiratory: Negative for cough and shortness of breath.   Cardiovascular: Negative for chest pain.  Gastrointestinal: Positive for nausea. Negative for abdominal pain, constipation, diarrhea and vomiting.  Genitourinary: Negative for difficulty urinating and dysuria.  Musculoskeletal: Positive for arthralgias (ongoing no new). Negative for back pain and neck pain.  Skin: Negative for rash.  Neurological: Positive for headaches. Negative for syncope, weakness, light-headedness and numbness.    Physical Exam Updated Vital Signs BP 121/79   Pulse (!) 112   Temp 98.9 F (37.2 C) (Oral)    Resp 16   Ht 5\' 1"  (1.549 m)   Wt 102.1 kg   LMP 01/22/2011   SpO2 100%   BMI 42.51 kg/m   Physical Exam Vitals and nursing note reviewed.  Constitutional:      General: She is not in acute distress.    Appearance: She is well-developed. She is not diaphoretic.  HENT:     Head: Normocephalic and atraumatic.  Eyes:     Conjunctiva/sclera: Conjunctivae normal.  Cardiovascular:     Rate and Rhythm: Regular rhythm. Tachycardia present.     Heart sounds: Normal heart sounds. No murmur heard.  No friction rub. No gallop.   Pulmonary:     Effort: Pulmonary effort is normal. No respiratory distress.     Breath sounds: Normal breath sounds. No wheezing or rales.  Abdominal:     General: There is no distension.     Palpations: Abdomen is soft.     Tenderness: There is no abdominal tenderness. There is no guarding.  Musculoskeletal:        General: Tenderness (midthoracic back) present.     Cervical back: Normal range of motion.     Comments: Contusion right posterior shoulder  Skin:    General: Skin is warm and dry.     Findings: No erythema or rash.     Comments: Venous stasis changes bilat LE   Neurological:     Mental Status: She is alert and oriented to person, place, and time.     ED Results / Procedures / Treatments   Labs (all labs ordered are listed, but only abnormal results are displayed) Labs Reviewed  CBC WITH DIFFERENTIAL/PLATELET - Abnormal; Notable for the following components:      Result Value   RDW 16.1 (*)    Platelets 109 (*)    All other components within normal limits  COMPREHENSIVE METABOLIC PANEL - Abnormal; Notable for the following components:   CO2 21 (*)    Glucose, Bld 125 (*)    Calcium 8.3 (*)    Albumin 2.9 (*)    AST 108 (*)    Total Bilirubin 3.2 (*)    All other components within normal limits  LACTIC ACID, PLASMA - Abnormal; Notable for the following components:   Lactic Acid, Venous 2.3 (*)    All other components within normal  limits  CK - Abnormal; Notable for the following components:   Total CK 1,037 (*)    All other components within normal  limits  PROTIME-INR - Abnormal; Notable for the following components:   Prothrombin Time 18.3 (*)    INR 1.6 (*)    All other components within normal limits  CK - Abnormal; Notable for the following components:   Total CK 855 (*)    All other components within normal limits  CBG MONITORING, ED - Abnormal; Notable for the following components:   Glucose-Capillary 104 (*)    All other components within normal limits  URINE CULTURE  LACTIC ACID, PLASMA  URINALYSIS, ROUTINE W REFLEX MICROSCOPIC    EKG EKG Interpretation  Date/Time:  Sunday March 09 2020 17:21:08 EDT Ventricular Rate:  154 PR Interval:    QRS Duration: 127 QT Interval:  312 QTC Calculation: 500 R Axis:   102 Text Interpretation: Suspect sinus tachycardia RBBB and LPFB Since prior ECG< rate has increased Confirmed by Gianluca Chhim ([id number]) on 03/09/2020 5:35:48 PM   Radiology DG Chest 1 View  Result Date: 03/09/2020 CLINICAL DATA:  Fell. EXAM: CHEST  1 VIEW COMPARISON:  06/10/2016 FINDINGS: Interval mildly enlarged cardiac silhouette. Tortuous aorta. Clear lungs with normal vascularity. Thoracic spine degenerative changes. No fracture or pneumothorax. Cholecystectomy clips. IMPRESSION: Interval mild cardiomegaly. Electronically Signed   By: Steven  Reid M.D.   On: 03/09/2020 19:29   DG Thoracic Spine 2 View  Result Date: 03/09/2020 CLINICAL DATA:  Back pain following a fall. EXAM: THORACIC SPINE 2 VIEWS COMPARISON:  None. FINDINGS: The cervicothoracic region is not clearly visible on the lateral images despite multiple repeat images, due to the thickness of the overlying soft tissues. No visible fracture or subluxation. Multilevel thoracic spine degenerative changes. IMPRESSION: 1. Limited examination demonstrating no visible fracture or subluxation. 2. Multilevel thoracic spine degenerative  changes. Electronically Signed   By: Steven  Reid M.D.   On: 03/09/2020 19:30   CT Head Wo Contrast  Result Date: 03/09/2020 CLINICAL DATA:  Change in mental status EXAM: CT HEAD WITHOUT CONTRAST TECHNIQUE: Contiguous axial images were obtained from the base of the skull through the vertex without intravenous contrast. COMPARISON:  August 27, 2015 FINDINGS: Brain: No evidence of acute territorial infarction, hemorrhage, hydrocephalus,extra-axial collection or mass lesion/mass effect. There is dilatation the ventricles and sulci consistent with age-related atrophy. Low-attenuation changes in the deep white matter consistent with small vessel ischemia. Vascular: No hyperdense vessel or unexpected calcification. Skull: The skull is intact. No fracture or focal lesion identified. Sinuses/Orbits: Ethmoid air cell mucosal thickening is seen. The orbits and globes intact. Other: None IMPRESSION: No acute intracranial abnormality. Findings consistent with age related atrophy and chronic small vessel ischemia Electronically Signed   By: Bindu  Avutu M.D.   On: 03/09/2020 18:39    Procedures Procedures (including critical care time)  Medications Ordered in ED Medications  lactated ringers bolus 1,000 mL (0 mLs Intravenous Stopped 03/09/20 1856)  sodium chloride 0.9 % bolus 1,000 mL (0 mLs Intravenous Stopped 03/09/20 2131)    ED Course  I have reviewed the triage vital signs and the nursing notes.  Pertinent labs & imaging results that were available during my care of the patient were reviewed by me and considered in my medical decision making (see chart for details).    MDM Rules/Calculators/A&P                          86 yo female with history of atrial fibrillation, DVT, PE on coumadin, hypertension, hypothyroidism presents with concern for being found down after fall 2 days  ago.  Arrives with tachycardia to 150s, suspect it is sinus tachycardia in setting of dehydration.  Given fall on coumadin,  CT head ordered and without acute findings.  XR thoracic spine and CXR show no acute findings.   Labs significant for normal creatinine, lactate 2.3.  CK mildly elevated 1037.  Tachycardia improved.  She is well appearing, tolerating po, ambulating to bathroom.  Given IV fluids and lactic acid improved, CK improving.  Afebrile, no urinary symptoms.  She is feeling well, comfortable with discharge home and PCP follow up. Reports neighbors check on her. Recommend life alert, keeping phone nearby as precaution while living alone.     Final Clinical Impression(s) / ED Diagnoses Final diagnoses:  Fall, initial encounter  Dehydration  Elevated CK  Contusion of right shoulder, initial encounter    Rx / DC Orders ED Discharge Orders    None       Gareth Morgan, MD 03/10/20 [phone]

## 2020-03-09 NOTE — ED Notes (Signed)
Started LR Bolus per conversation with EDP.

## 2020-03-09 NOTE — ED Notes (Signed)
Patient verbalizes understanding of discharge instructions. Opportunity for questioning and answers were provided. Armband removed by staff, pt discharged from ED. Pt. ambulatory and discharged home.  

## 2020-03-09 NOTE — ED Notes (Signed)
Patient transported to X-ray 

## 2020-03-09 NOTE — ED Notes (Signed)
Called EDP to report hr and request IVF.

## 2020-03-09 NOTE — Progress Notes (Addendum)
   Paged by Dr. Billy Fischer to discuss possible admission. Patient rolled out of bed and was on the ground for two days and presents with mildly elevated CK and Lactate however no AKI and imaging without acute findings. Patient is receiving 2L IVF in ED and also able to tolerate PO. Reasonable to discharge home from chart review if patient is not a high fall risk as vitals have improved and expect slight lab abnormalities to improve over next 12-24 hours after the IVF received in ED. Please page back should other concerns arise warranting admission.  Chauncey Mann, MD [facility] Pager [phone]

## 2020-03-11 ENCOUNTER — Encounter (HOSPITAL_COMMUNITY): Payer: Self-pay

## 2020-03-11 ENCOUNTER — Emergency Department (HOSPITAL_COMMUNITY): Payer: Medicare Other

## 2020-03-11 ENCOUNTER — Emergency Department (HOSPITAL_COMMUNITY)
Admission: EM | Admit: 2020-03-11 | Discharge: 2020-03-13 | Disposition: A | Discharge status: Home / Self Care | Payer: Medicare Other | Attending: Emergency Medicine | Admitting: Emergency Medicine

## 2020-03-11 DIAGNOSIS — R531 Weakness: Secondary | ICD-10-CM | POA: Diagnosis present

## 2020-03-11 DIAGNOSIS — R5381 Other malaise: Secondary | ICD-10-CM | POA: Diagnosis not present

## 2020-03-11 DIAGNOSIS — I10 Essential (primary) hypertension: Secondary | ICD-10-CM | POA: Diagnosis not present

## 2020-03-11 DIAGNOSIS — E039 Hypothyroidism, unspecified: Secondary | ICD-10-CM | POA: Diagnosis not present

## 2020-03-11 DIAGNOSIS — R000 Tachycardia, unspecified: Secondary | ICD-10-CM | POA: Diagnosis not present

## 2020-03-11 DIAGNOSIS — E860 Dehydration: Secondary | ICD-10-CM

## 2020-03-11 DIAGNOSIS — Z20822 Contact with and (suspected) exposure to covid-19: Secondary | ICD-10-CM | POA: Insufficient documentation

## 2020-03-11 DIAGNOSIS — Z96651 Presence of right artificial knee joint: Secondary | ICD-10-CM | POA: Diagnosis not present

## 2020-03-11 DIAGNOSIS — N3090 Cystitis, unspecified without hematuria: Secondary | ICD-10-CM | POA: Insufficient documentation

## 2020-03-11 DIAGNOSIS — Z7901 Long term (current) use of anticoagulants: Secondary | ICD-10-CM | POA: Diagnosis not present

## 2020-03-11 DIAGNOSIS — Z96652 Presence of left artificial knee joint: Secondary | ICD-10-CM | POA: Diagnosis not present

## 2020-03-11 DIAGNOSIS — N3000 Acute cystitis without hematuria: Secondary | ICD-10-CM

## 2020-03-11 LAB — CBC
HCT: 38.9 % (ref 36.0–46.0)
Hemoglobin: 12.5 g/dL (ref 12.0–15.0)
MCH: 28.5 pg (ref 26.0–34.0)
MCHC: 32.1 g/dL (ref 30.0–36.0)
MCV: 88.8 fL (ref 80.0–100.0)
Platelets: 153 10*3/uL (ref 150–400)
RBC: 4.38 MIL/uL (ref 3.87–5.11)
RDW: 15.9 % — ABNORMAL HIGH (ref 11.5–15.5)
WBC: 9.7 10*3/uL (ref 4.0–10.5)
nRBC: 0 % (ref 0.0–0.2)

## 2020-03-11 LAB — HEPATIC FUNCTION PANEL
ALT: 43 U/L (ref 0–44)
AST: 68 U/L — ABNORMAL HIGH (ref 15–41)
Albumin: 3.1 g/dL — ABNORMAL LOW (ref 3.5–5.0)
Alkaline Phosphatase: 88 U/L (ref 38–126)
Bilirubin, Direct: 1.4 mg/dL — ABNORMAL HIGH (ref 0.0–0.2)
Indirect Bilirubin: 2.1 mg/dL — ABNORMAL HIGH (ref 0.3–0.9)
Total Bilirubin: 3.5 mg/dL — ABNORMAL HIGH (ref 0.3–1.2)
Total Protein: 7.9 g/dL (ref 6.5–8.1)

## 2020-03-11 LAB — URINALYSIS, ROUTINE W REFLEX MICROSCOPIC
Bilirubin Urine: NEGATIVE
Glucose, UA: NEGATIVE mg/dL
Ketones, ur: 5 mg/dL — AB
Nitrite: POSITIVE — AB
Protein, ur: 30 mg/dL — AB
Specific Gravity, Urine: 1.02 (ref 1.005–1.030)
WBC, UA: >50 WBC/hpf — ABNORMAL HIGH (ref 0–5)
pH: 5 (ref 5.0–8.0)

## 2020-03-11 LAB — PROTIME-INR
INR: 1.7 — ABNORMAL HIGH (ref 0.8–1.2)
Prothrombin Time: 19.2 seconds — ABNORMAL HIGH (ref 11.4–15.2)

## 2020-03-11 LAB — BASIC METABOLIC PANEL
Anion gap: 12 (ref 5–15)
BUN: 22 mg/dL (ref 8–23)
CO2: 22 mmol/L (ref 22–32)
Calcium: 8.7 mg/dL — ABNORMAL LOW (ref 8.9–10.3)
Chloride: 104 mmol/L (ref 98–111)
Creatinine, Ser: 0.81 mg/dL (ref 0.44–1.00)
GFR calc Af Amer: >60 mL/min (ref >60)
GFR calc non Af Amer: >60 mL/min (ref >60)
Glucose, Bld: 142 mg/dL — ABNORMAL HIGH (ref 70–99)
Potassium: 3.3 mmol/L — ABNORMAL LOW (ref 3.5–5.1)
Sodium: 138 mmol/L (ref 135–145)

## 2020-03-11 LAB — CK: Total CK: 607 U/L — ABNORMAL HIGH (ref 38–234)

## 2020-03-11 LAB — SARS CORONAVIRUS 2 BY RT PCR (HOSPITAL ORDER, PERFORMED IN ~~LOC~~ HOSPITAL LAB): SARS Coronavirus 2: NEGATIVE

## 2020-03-11 LAB — LACTIC ACID, PLASMA: Lactic Acid, Venous: 2.4 mmol/L (ref 0.5–1.9)

## 2020-03-11 MED ORDER — LACTATED RINGERS IV BOLUS
1000.0000 mL | Freq: Once | INTRAVENOUS | Status: AC
  Administered 2020-03-11: 1000 mL via INTRAVENOUS

## 2020-03-11 MED ORDER — ATORVASTATIN CALCIUM 40 MG PO TABS
40.0000 mg | ORAL_TABLET | Freq: Every day | ORAL | Status: DC
  Administered 2020-03-11 – 2020-03-13 (×3): 40 mg via ORAL
  Filled 2020-03-11 (×3): qty 1

## 2020-03-11 MED ORDER — DIGOXIN 250 MCG PO TABS
0.2500 mg | ORAL_TABLET | Freq: Every day | ORAL | Status: DC
  Administered 2020-03-11 – 2020-03-13 (×3): 0.25 mg via ORAL
  Filled 2020-03-11 (×3): qty 1

## 2020-03-11 MED ORDER — WARFARIN SODIUM 1 MG PO TABS
1.0000 mg | ORAL_TABLET | Freq: Every day | ORAL | Status: DC
  Administered 2020-03-11 – 2020-03-12 (×2): 1 mg via ORAL
  Filled 2020-03-11 (×3): qty 1

## 2020-03-11 MED ORDER — WARFARIN - PHYSICIAN DOSING INPATIENT: Freq: Every day | Status: DC

## 2020-03-11 MED ORDER — CEPHALEXIN 500 MG PO CAPS
500.0000 mg | ORAL_CAPSULE | Freq: Two times a day (BID) | ORAL | Status: DC
  Administered 2020-03-11 – 2020-03-13 (×5): 500 mg via ORAL
  Filled 2020-03-11 (×5): qty 1

## 2020-03-11 MED ORDER — LEVOTHYROXINE SODIUM 25 MCG PO TABS
125.0000 ug | ORAL_TABLET | Freq: Every day | ORAL | Status: DC
  Administered 2020-03-11 – 2020-03-13 (×3): 125 ug via ORAL
  Filled 2020-03-11 (×3): qty 1

## 2020-03-11 MED ORDER — METOPROLOL SUCCINATE ER 50 MG PO TB24
100.0000 mg | ORAL_TABLET | Freq: Every day | ORAL | Status: DC
  Administered 2020-03-11 – 2020-03-12 (×2): 100 mg via ORAL
  Filled 2020-03-11 (×3): qty 2

## 2020-03-11 NOTE — ED Provider Notes (Signed)
Katrina [name]   CSN: [id number] Arrival date & time: 03/11/20  3235     History Chief Complaint  Patient presents with  . Weakness    Katrina [name] is a 86 y.o. woman with history of atrial fibrillation, DVT/PE on coumadin, hypertension, hypothyroidism presents after being unable to get up out of her couch for 12 hours secondary to generalized weakness.  The patient states last night (7/20) her friends/neighbors brought her dinner, which she ate on her couch, and soon after they left, she realized she wasn't able to get up out of the couch on her own. She states she felt like her normal self but did not have the strength to stand up. She stayed on the couch until 4 am when she decided to call a neighbor for help. The neighbor insisted that she present to the ED since the patient had urinated on herself, and the neighbor thinks the patient should not be living alone.  On evaluation, the patient has no acute complaints. She denies dizziness, headache, chest pain, shortness of breath, numbness/tingling, dysuria, N/V/diarrhea. She reports that at baseline, for the last two years, she has been aware that she may not be able to get up if she falls secondary to generalized weakness. She furniture surfs in her apartment and uses a cane when ambulating outside of her home. She still drives and shops at the grocery store by herself, however she spends much less time outside of her home since the pandemic. She states she wouldn't mind someone coming by her home to check in on her now and again, but she does not want to go to a nursing home. She reports that she has been wearing disposable diapers for a couple of years now because of urinary urgency. At one point in the interview she states she has not taken any of her medications since 5 days ago, however, she also states she only missed last night's doses.  Patient was seen two days ago on 7/18 for after being  found down after a fall two days prior. She was tachycardic to the 150s on arrival but otherwise with stable vitals. Given her fall on coumadin, CT head was ordered and without acute findings. XR thoracic spine and CXR also without acute findings. Labs were significant for lactate of 2.3 and mildly elevated CK at 1037. Her tachycardia, lactic acid, and CK improved with administration of IV fluids. She was discharged home with PCP follow-up.     Past Medical History:  Diagnosis Date  . Atrial fibrillation ([place])   . DVT (deep venous thrombosis) ([place])   . Elevated lipids   . Gallstone   . Hypertension   . Hypothyroidism   . Nephrolithiasis   . Overactive bladder   . Pulmonary emboli ([facility])    while on HRT  . SCC (squamous cell carcinoma)    of left leg  . Sleep apnea    on C-pap  . Vertigo     Patient Active Problem List   Diagnosis Date Noted  . Sleep apnea 01/10/2017  . Hypertension 01/10/2017  . Hypothyroidism 01/10/2017  . Overactive bladder 01/10/2017  . Vertigo 01/10/2017  . Elevated lipids 01/10/2017    Past Surgical History:  Procedure Laterality Date  . CHOLECYSTECTOMY     Dr. Zenia Resides  . CYSTOSCOPY WITH STENT PLACEMENT  9/12   nephrolithaisis  . HYSTEROSCOPY WITH D & C  7/09   PMP with endometrial polyp  . REPLACEMENT  TOTAL KNEE Bilateral 1998, 2001      . TONSILLECTOMY AND ADENOIDECTOMY       OB History    Gravida  0   Para  0   Term  0   Preterm  0   AB  0   Living  0     SAB  0   TAB  0   Ectopic  0   Multiple  0   Live Births              Family History  Problem Relation Age of Onset  . Hypertension Father   . Heart disease Father   . Hypertension Mother   . Hyperlipidemia Mother   . Heart attack Mother   . Prostate cancer Brother     Social History   Tobacco Use  . Smoking status: Never Smoker  . Smokeless tobacco: Never Used  Vaping Use  . Vaping Use: Never used  Substance Use Topics  . Alcohol use: No     Alcohol/week: 0.0 standard drinks  . Drug use: No    Home Medications Prior to Admission medications   Medication Sig Start Date End Date Taking? Authorizing Provider  atorvastatin (LIPITOR) 40 MG tablet Take 40 mg by mouth daily.  09/11/15  Yes [provider]  digoxin (LANOXIN) 0.25 MG tablet Take 0.25 mg by mouth daily.   Yes [provider]  levothyroxine (SYNTHROID, LEVOTHROID) 125 MCG tablet Take 125 mcg by mouth daily.    Yes [provider]  metoprolol succinate (TOPROL-XL) 100 MG 24 hr tablet Take 100 mg by mouth daily. 11/06/14  Yes [provider]  warfarin (COUMADIN) 1 MG tablet Take 1 mg by mouth daily.  06/08/19  Yes [provider]    Allergies    Penicillins and Penicillin g  Review of Systems   Review of Systems  All other systems reviewed and are negative.  10 systems reviewed and are negative for acute changes, except as noted in the HPI.  Physical Exam Updated Vital Signs BP 128/64   Pulse 93   Temp 97.7 F (36.5 C) (Oral)   Resp (!) 23   Ht 5\' 1"  (1.549 m)   Wt 99.8 kg   LMP 01/22/2011   SpO2 90%   BMI 41.57 kg/m   Physical Exam Constitutional:      Comments: Well-appearing older woman in no acute distress, sitting comfortably in chair, odor suggestive of urinary incontinence  HENT:     Head: Normocephalic and atraumatic.     Mouth/Throat:     Mouth: Mucous membranes are moist.  Eyes:     Extraocular Movements: Extraocular movements intact.     Conjunctiva/sclera: Conjunctivae normal.     Pupils: Pupils are equal, round, and reactive to light.  Cardiovascular:     Rate and Rhythm: Tachycardia present. Rhythm irregularly irregular.     Heart sounds: Normal heart sounds.  Pulmonary:     Effort: Pulmonary effort is normal.     Breath sounds: Normal breath sounds.  Abdominal:     Palpations: Abdomen is soft.     Tenderness: There is no abdominal tenderness.  Musculoskeletal:     Cervical back: Normal  range of motion and neck supple.     Right lower leg: 2+ Edema present.     Left lower leg: 2+ Edema present.  Skin:    Comments: Chronic-appearing venous stasis skin changes on bilateral lower legs, small actively bleeding abrasion on anterior of R  lower leg  Neurological:     Mental Status: She is oriented to person, place, and time.     Sensory: Sensation is intact.     Comments: 5/5 strength in bilateral upper extremities. 4/5 strength with hip flexion, possibly effort dependent     ED Results / Procedures / Treatments   Labs (all labs ordered are listed, but only abnormal results are displayed) Labs Reviewed  BASIC METABOLIC PANEL - Abnormal; Notable for the following components:      Result Value   Potassium 3.3 (*)    Glucose, Bld 142 (*)    Calcium 8.7 (*)    All other components within normal limits  CBC - Abnormal; Notable for the following components:   RDW 15.9 (*)    All other components within normal limits  URINALYSIS, ROUTINE W REFLEX MICROSCOPIC - Abnormal; Notable for the following components:   Color, Urine AMBER (*)    APPearance CLOUDY (*)    Hgb urine dipstick SMALL (*)    Ketones, ur 5 (*)    Protein, ur 30 (*)    Nitrite POSITIVE (*)    Leukocytes,Ua MODERATE (*)    WBC, UA >50 (*)    Bacteria, UA MANY (*)    All other components within normal limits  HEPATIC FUNCTION PANEL - Abnormal; Notable for the following components:   Albumin 3.1 (*)    AST 68 (*)    Total Bilirubin 3.5 (*)    Bilirubin, Direct 1.4 (*)    Indirect Bilirubin 2.1 (*)    All other components within normal limits  CK - Abnormal; Notable for the following components:   Total CK 607 (*)    All other components within normal limits  LACTIC ACID, PLASMA - Abnormal; Notable for the following components:   Lactic Acid, Venous 2.4 (*)    All other components within normal limits  PROTIME-INR - Abnormal; Notable for the following components:   Prothrombin Time 19.2 (*)    INR 1.7  (*)    All other components within normal limits  SARS CORONAVIRUS 2 BY RT PCR [facility] ORDER, [place] LAB)  URINE CULTURE    EKG EKG Interpretation  Date/Time:  Tuesday March 11 2020 06:38:46 EDT Ventricular Rate:  124 PR Interval:    QRS Duration: 131 QT Interval:  350 QTC Calculation: 503 R Axis:   95 Text Interpretation: Atrial fibrillation RBBB and LPFB Probable posterior infarct, acute 12 Lead; [place] now atrial fibrillation Confirmed by Ezequiel Essex [phone]) on 03/11/2020 6:43:52 AM  Radiology DG Chest 1 View  Result Date: 03/09/2020 CLINICAL DATA:  [place]. EXAM: CHEST  1 VIEW COMPARISON:  06/10/2016 FINDINGS: Interval mildly enlarged cardiac silhouette. Tortuous aorta. Clear lungs with normal vascularity. Thoracic spine degenerative changes. No fracture or pneumothorax. Cholecystectomy clips. IMPRESSION: Interval mild cardiomegaly. Electronically Signed   By: Claudie Revering M.D.   On: 03/09/2020 19:29   DG Chest 2 View  Result Date: 03/11/2020 CLINICAL DATA:  Weakness and multiple falls EXAM: CHEST - 2 VIEW COMPARISON:  03/09/2020 FINDINGS: No new consolidation or edema. No pleural effusion or pneumothorax. Stable cardiomediastinal contours. No acute osseous abnormality. IMPRESSION: No acute process in the chest. Electronically Signed   By: Macy Mis M.D.   On: 03/11/2020 08:12   DG Thoracic Spine 2 View  Result Date: 03/09/2020 CLINICAL DATA:  Back pain following a fall. EXAM: THORACIC SPINE 2 VIEWS COMPARISON:  None. FINDINGS: The cervicothoracic region is not clearly visible  on the lateral images despite multiple repeat images, due to the thickness of the overlying soft tissues. No visible fracture or subluxation. Multilevel thoracic spine degenerative changes. IMPRESSION: 1. Limited examination demonstrating no visible fracture or subluxation. 2. Multilevel thoracic spine degenerative changes. Electronically Signed   By: Claudie Revering M.D.    On: 03/09/2020 19:30   CT Head Wo Contrast  Result Date: 03/11/2020 CLINICAL DATA:  Multiple falls EXAM: CT HEAD WITHOUT CONTRAST TECHNIQUE: Contiguous axial images were obtained from the base of the skull through the vertex without intravenous contrast. COMPARISON:  03/09/2020 FINDINGS: Brain: There is no acute intracranial hemorrhage, mass effect, or edema. Gray-white differentiation is preserved. There is no extra-axial fluid collection. Stable prominence of the ventricles and sulci reflecting generalized parenchymal volume loss. Patchy and confluent areas of hypoattenuation in the supratentorial white matter are nonspecific but likely reflect stable advanced chronic microvascular ischemic changes. There is a previously unreported mildly hyperdense lesion along the inferior falx measuring approximately 1.5 x 1.3 x 0.9 cm. No substantial change since 2017. Vascular: There is atherosclerotic calcification at the skull base. Skull: Calvarium is unremarkable. Sinuses/Orbits: Retention cyst or polyp of posterior left ethmoid sinus. No acute orbital abnormality. Other: Mastoid air cells are clear. IMPRESSION: No acute intracranial abnormality. Stable chronic findings detailed above. Small meningioma along the inferior falx without substantial change since 2017. Electronically Signed   By: Macy Mis M.D.   On: 03/11/2020 08:10   CT Head Wo Contrast  Result Date: 03/09/2020 CLINICAL DATA:  Change in mental status EXAM: CT HEAD WITHOUT CONTRAST TECHNIQUE: Contiguous axial images were obtained from the base of the skull through the vertex without intravenous contrast. COMPARISON:  August 27, 2015 FINDINGS: Brain: No evidence of acute territorial infarction, hemorrhage, hydrocephalus,extra-axial collection or mass lesion/mass effect. There is dilatation the ventricles and sulci consistent with age-related atrophy. Low-attenuation changes in the deep white matter consistent with small vessel ischemia. Vascular:  No hyperdense vessel or unexpected calcification. Skull: The skull is intact. No fracture or focal lesion identified. Sinuses/Orbits: Ethmoid air cell mucosal thickening is seen. The orbits and globes intact. Other: None IMPRESSION: No acute intracranial abnormality. Findings consistent with age related atrophy and chronic small vessel ischemia Electronically Signed   By: Prudencio Pair M.D.   On: 03/09/2020 18:39    Medications Ordered in ED Medications  atorvastatin (LIPITOR) tablet 40 mg (40 mg Oral Given 03/11/20 1211)  digoxin (LANOXIN) tablet 0.25 mg (0.25 mg Oral Given 03/11/20 1216)  levothyroxine (SYNTHROID) tablet 125 mcg (125 mcg Oral Given 03/11/20 1210)  metoprolol succinate (TOPROL-XL) 24 hr tablet 100 mg (100 mg Oral Given 03/11/20 1211)  warfarin (COUMADIN) tablet 1 mg (has no administration in time range)  Warfarin - Physician Dosing Inpatient (has no administration in time range)  cephALEXin (KEFLEX) capsule 500 mg (has no administration in time range)  lactated ringers bolus 1,000 mL (1,000 mLs Intravenous New Bag/Given 03/11/20 1156)    ED Course  I have reviewed the triage vital signs and the nursing notes.  Pertinent labs & imaging results that were available during my care of the patient were reviewed by me and considered in my medical decision making (see chart for details).    MDM Rules/Calculators/A&P                          Katrina [name] is a 86 y.o. woman with history of atrial fibrillation, DVT/PE on coumadin, hypertension, hypothyroidism presents after  being unable to get up out of her couch for 12 hours secondary to generalized weakness.  Patient is tachycardic on arrival but overall well-appearing and in no acute distress. She has no acute complaints. She reports intermittent falls and difficulty getting up over the last couple of years. Given recent fall on coumadin, CT head ordered and without acute findings. CXR without acute process in the chest or evidence of  fracture. Labs significant for 0.81 creatinine (slight bump from her baseline), lactic acid 2.4. CK very mildly elevated at 607. Will bolus with 1 L LR. This is the patient's second visit for generalized weakness and falls in the last couple of days. I am concerned about the functional status of the patient at home and risk of injury, however she is appears to be competent and have decision making capacity. The patient has expressed she is amenable to receiving help at home, so will consult social work for possible home health. Patient's weakness appears secondary to general deconditioning and would likely benefit from PT/OT as well.  - Urinalysis with many bacteria, moderate leukocytes, and positive nitrites. Patient treated with Keflex 500 mg BID.  - Patient evaluated by OT who recommend SNF placement and feel patient is not safe to go home. Awaiting PT evaluation. Patient will remain in the ED until safe dispo plan becomes available per social work.  Final Clinical Impression(s) / ED Diagnoses Final diagnoses:  Dehydration  Physical deconditioning  Acute cystitis without hematuria    Rx / DC Orders ED Discharge Orders    None       Alexandria Lodge, MD 03/11/20 Babcock, Wenda Overland, MD 03/12/20 [phone]

## 2020-03-11 NOTE — ED Notes (Signed)
Pt sleeping at present time.

## 2020-03-11 NOTE — NC FL2 (Signed)
[place] MEDICAID [id number] LEVEL OF CARE SCREENING TOOL     IDENTIFICATION  Patient Name: Katrina [name] Birthdate: October 27, 1931 Sex: female Admission Date (Current Location): 03/11/2020  [facility] and [place] Number:  Herbalist and Address:  [facility],  [place] [address] [place]      Provider Number: [id number]  Attending Physician Name and Address:  Default, Provider, MD  Relative Name and Phone Number:       Current Level of Care: Hospital Recommended Level of Care: [place] Prior Approval Number:    Date Approved/Denied: 03/11/20 PASRR Number: [id number]  Discharge Plan: SNF    Current Diagnoses: Patient Active Problem List   Diagnosis Date Noted  . Sleep apnea 01/10/2017  . Hypertension 01/10/2017  . Hypothyroidism 01/10/2017  . Overactive bladder 01/10/2017  . Vertigo 01/10/2017  . Elevated lipids 01/10/2017    Orientation RESPIRATION BLADDER Height & Weight     Self, Time, Situation, Place  Normal Incontinent Weight: 220 lb (99.8 kg) Height:  5\' 1"  (154.9 cm)  BEHAVIORAL SYMPTOMS/MOOD NEUROLOGICAL BOWEL NUTRITION STATUS      Incontinent Diet (Regular)  AMBULATORY STATUS COMMUNICATION OF NEEDS Skin   Extensive Assist Verbally Other (Comment) (Breakdown inside buttocks and on her right outer hip)                       Personal Care Assistance Level of Assistance  Bathing, Dressing Bathing Assistance: Limited assistance   Dressing Assistance: Limited assistance     Functional Limitations Info             SPECIAL CARE FACTORS FREQUENCY  PT (By licensed PT), OT (By licensed OT)     PT Frequency: 5 OT Frequency: 5            Contractures Contractures Info: Not present    Additional Factors Info  Code Status, Allergies Code Status Info: Not on file Allergies Info: Penicillins, Penicillin G           Current Medications (03/11/2020):  This is the current hospital active medication  list Current Facility-Administered Medications  Medication Dose Route Frequency Provider Last Rate Last Admin  . atorvastatin (LIPITOR) tablet 40 mg  40 mg Oral Daily Little, Wenda Overland, MD   40 mg at 03/11/20 1211  . cephALEXin (KEFLEX) capsule 500 mg  500 mg Oral Q12H Alexandria Lodge, MD   500 mg at 03/11/20 1512  . digoxin (LANOXIN) tablet 0.25 mg  0.25 mg Oral Daily Little, Wenda Overland, MD   0.25 mg at 03/11/20 1216  . [START ON 03/12/2020] levothyroxine (SYNTHROID) tablet 125 mcg  125 mcg Oral QAC breakfast Little, Wenda Overland, MD   125 mcg at 03/11/20 1210  . metoprolol succinate (TOPROL-XL) 24 hr tablet 100 mg  100 mg Oral Daily Little, Wenda Overland, MD   100 mg at 03/11/20 1211  . warfarin (COUMADIN) tablet 1 mg  1 mg Oral q1600 Little, Wenda Overland, MD   1 mg at 03/11/20 1619  . Warfarin - Physician Dosing Inpatient   Does not apply q1600 Little, Wenda Overland, MD       Current Outpatient Medications  Medication Sig Dispense Refill  . atorvastatin (LIPITOR) 40 MG tablet Take 40 mg by mouth daily.   0  . digoxin (LANOXIN) 0.25 MG tablet Take 0.25 mg by mouth daily.    Marland Kitchen levothyroxine (SYNTHROID, LEVOTHROID) 125 MCG tablet Take 125 mcg by mouth daily.     Marland Kitchen  metoprolol succinate (TOPROL-XL) 100 MG 24 hr tablet Take 100 mg by mouth daily.  0  . warfarin (COUMADIN) 1 MG tablet Take 1 mg by mouth daily.        Discharge Medications: Please see discharge summary for a list of discharge medications.  Relevant Imaging Results:  Relevant Lab Results:   Additional Information [id number]  Alphonse Guild Arlyn Bumpus, LCSW

## 2020-03-11 NOTE — ED Triage Notes (Addendum)
Pt sts weakness x 3 weeks and multiple falls. Coming from home where she lives by herself. Pt called out for a welfare check, same as 7/18th encounter. Pt found covered in urine and unable to move or complete adl's.

## 2020-03-11 NOTE — ED Notes (Signed)
Pt given meal tray and is eating independently.

## 2020-03-11 NOTE — Progress Notes (Signed)
Assessment completed.  FL-2 created/signed by EDP who agrees to initiate rapid COVID test and PT consult resulted in SNF recommendation.  Referrals sent out per pt's request via the hub to the [facility] [place] are with pt's provided verbal permission. PT notes sent to all SNF's.  CSW will continue to follow for D/C needs.  Alphonse Guild. Mariam Helbert  MSW, LCSW, LCAS, CCS Transitions of Care Clinical Social Worker Care Coordination Department Ph: [phone]

## 2020-03-11 NOTE — TOC Initial Note (Signed)
Transition of Care [facility]) - Initial/Assessment Note    Patient Details  Name: Katrina [name] MRN: [id number] Date of Birth: [birth year]  Transition of Care [facility]) CM/SW Contact:    Claudine Mouton, LCSW Phone Number: 03/11/2020, 11:08 PM  Clinical Narrative:        CSW spoke with pt and confirmed pt's plan to be discharged to SNF for short-term rehab at discharge.  CSW provided active listening and validated pt's concerns that she could not care for herself alone at home at this time.   CSW was given verbal permission to complete FL-2 and send referrals out to SNF facilities via the hub, per pt's request. Pt has been living independently prior to being admitted to [facility] ED.  EDP updated and agrees to initiate rapid covid test.            Expected Discharge Plan: Skilled Nursing Facility Barriers to Discharge: Insurance Authorization   Patient Goals and CMS Choice Patient states their goals for this hospitalization and ongoing recovery are:: To regain pt's strength and return home post-SNF, per the pt.      Expected Discharge Plan and Services Expected Discharge Plan: [place] Choice: [place] arrangements for the past 2 months: Single Family Home                                      Prior Living Arrangements/Services Living arrangements for the past 2 months: Single Family Home Lives with:: Self Patient language and need for interpreter reviewed:: No Do you feel safe going back to the place where you live?: No      Need for Family Participation in Patient Care: No (Comment) (Pt requested family not be contacted.) Care giver support system in place?: No (comment)      Activities of Daily Living      Permission Sought/Granted Permission sought to share information with : Chartered certified accountant granted to share information with : Yes, Verbal Permission Granted              Emotional  Assessment     Affect (typically observed): Afraid/Fearful, Calm, Overwhelmed, Stoic Orientation: : Oriented to Self, Oriented to Place, Oriented to  Time, Oriented to Situation   Psych Involvement: No (comment)  Admission diagnosis:  weakness Patient Active Problem List   Diagnosis Date Noted  . Sleep apnea 01/10/2017  . Hypertension 01/10/2017  . Hypothyroidism 01/10/2017  . Overactive bladder 01/10/2017  . Vertigo 01/10/2017  . Elevated lipids 01/10/2017   PCP:  [name], MD Pharmacy:   [facility] [address], [place] - [place] AT [place] [address] [place] [address] Phone: [phone] Fax: [phone]     Social Determinants of Health (SDOH) Interventions    Readmission Risk Interventions No flowsheet data found.

## 2020-03-11 NOTE — Social Work (Signed)
CSW is awaiting OT and PT evals.  Pt has given verbal consent to speak with Dodie Parisi (family member) [phone].    Pt has given CSW verbal permission to began SNF placement process.Pt and family would like a SNF in [place] ([address] location per family member).    CSW will continue to follow for dc needs.  Zakariyah Freimark Tarpley-Carter, MSW, [place] ED Transitions of Engineer, building services Health [phone]

## 2020-03-11 NOTE — Social Work (Signed)
TOC CSW has received consult from Dr. Shon Baton. CSW attempting to follow up at present time.  Request is for home health care.   CSW will continue to follow for dc needs.  [name], MSW, [place] ED Transitions of Engineer, building services Health [phone]

## 2020-03-11 NOTE — ED Notes (Signed)
RN has stuck x2 for IV without success.

## 2020-03-11 NOTE — Progress Notes (Signed)
Pt was provided with a PASRR by [facility] MUST:  CSW will continue to follow for D/C needs.  Katrina [name]. Katrina [name]  MSW, LCSW, LCAS, CCS Transitions of Care Clinical Social Worker Care Coordination Department Ph: [phone]

## 2020-03-11 NOTE — ED Provider Notes (Signed)
Medical Decision Making: Care of patient assumed from Dr. Rex Kras at [phone].  Agree with history, physical exam and plan.  See their note for further details.  Briefly, The pt p/w multiple falls at home, multiple presentations for the same, no injury found reported, mild dehydration, given IV fluids, started back on home medications.  Social work consulted for placement skilled nursing facility for further care.  Found to have UTI but hemodynamically stable, treated with cephalosporins.  Current plan is as follows: Awaiting social work consult.  Social work and physical therapy do feel she needs placement at skilled nursing facility.  The patient agrees.  They will attempt placement now.  I personally reviewed and interpreted all labs/imaging.      Breck Coons, MD 03/11/20 2245

## 2020-03-11 NOTE — Progress Notes (Signed)
CSW spoke with pt and confirmed pt's plan to be discharged to SNF for short-term rehab at discharge.  CSW provided active listening and validated pt's concerns that she could not care for herself alone at home at this time.   CSW was given verbal permission to complete FL-2 and send referrals out to SNF facilities via the hub, per pt's request.  Pt has been living independently prior to being admitted to [facility] ED.  EDP updated and agrees to initiate rapid covid test.  CSW will continue to follow for D/C needs.  Alphonse Guild. Karen Huhta  MSW, LCSW, LCAS, CCS Transitions of Care Clinical Social Worker Care Coordination Department Ph: [phone]

## 2020-03-11 NOTE — ED Notes (Signed)
Repositioned and resting at present time

## 2020-03-11 NOTE — Progress Notes (Signed)
CSW updated EDP as to SNF recommendation from PT and EDP states placement attempt for safety is appropriate and states CSW to assist with placement process.  CSW voiced understanding.  CSW will continue to follow for D/C needs.  Alphonse Guild. Patty Leitzke  MSW, LCSW, LCAS, CCS Transitions of Care Clinical Social Worker Care Coordination Department Ph: [phone]

## 2020-03-11 NOTE — Social Work (Signed)
TOC CSW spoke with RN, she will speak with Dr. Rex Kras about ordering PT.  CSW is currently awaiting PT's eval.  CSW will continue to follow for dc needs.  Katrina [name], MSW, [place] [facility] Transitions of Care Clinical Social Worker [place] [phone]

## 2020-03-11 NOTE — Evaluation (Signed)
Physical Therapy Evaluation Patient Details Name: Katrina [name] MRN: [id number] DOB: 01/02/1932 Today's Date: 03/11/2020   History of Present Illness  86yo female with history of atrial fibrillation, DVT, PE on coumadin, hypertension, hypothyroidism who originally presented to the hospital 7/18 after being found down for 2-3 days. Pt had apparently rolled out of bed and was stuck between the wall and bed adn could not get up. Pt was discharged home. Per chart, pt called her neighbors after being unable to stand up from the couch at 4 am. Jilda Roche insisted she go to the ED. Found covered in urine and feces.   Clinical Impression  On eval in ED, pt required Mod assist +2 for mobility. She was able to stand, perform a stand pivot to/from bsc, and take a few ambulatory steps with a RW. Pt presents with general weakness, decreased activity tolerance, and impaired gait and balance. Presently, pt does not appear able to safely manage at home alone. Recommend ST SNF rehab. Will continue to follow during hospital stay.     Follow Up Recommendations SNF    Equipment Recommendations  None recommended by PT    Recommendations for Other Services       Precautions / Restrictions Precautions Precautions: Fall Precaution Comments: multiple wounds Restrictions Weight Bearing Restrictions: No      Mobility  Bed Mobility Overal bed mobility: Needs Assistance Bed Mobility: Supine to Sit;Sit to Supine     Supine to sit: Mod assist;+2 for physical assistance;+2 for safety/equipment Sit to supine: Mod assist;+2 for physical assistance;+2 for safety/equipment   General bed mobility comments: Assist for trunk and bil LEs. Increased time. Cues for safety, technique.  Transfers Overall transfer level: Needs assistance Equipment used: Rolling walker (2 wheeled) Transfers: Sit to/from Stand Sit to Stand: Mod assist;+2 safety/equipment Stand pivot transfers: Mod assist;+2 safety/equipment        General transfer comment: Assist to power up, stabilize, control descent. Stand pivot, bed<>bsc, with RW. Cues for safety, technique, hand placement.  Ambulation/Gait Ambulation/Gait assistance: Min assist;+2 safety/equipment Gait Distance (Feet): 5 Feet Assistive device: Rolling walker (2 wheeled) Gait Pattern/deviations: Step-through pattern;Decreased stride length     General Gait Details: Assist to stabilize pt throughout short distance. Pt unable to ambulate any further on today. Cues for safety.  Stairs            Wheelchair Mobility    Modified Rankin (Stroke Patients Only)       Balance Overall balance assessment: Needs assistance;History of Falls   Sitting balance-Leahy Scale: Fair     Standing balance support: Bilateral upper extremity supported Standing balance-Leahy Scale: Poor                               Pertinent Vitals/Pain Pain Assessment: Faces Faces Pain Scale: Hurts little more Pain Location: L LE Pain Descriptors / Indicators: Guarding;Discomfort;Sore Pain Intervention(s): Limited activity within patient's tolerance;Monitored during session    [place] expects to be discharged to:: Unsure Living Arrangements: Alone Available Help at Discharge: Neighbor;Available PRN/intermittently Type of Home: Apartment Home Access: Stairs to enter Entrance Stairs-Rails: Right;Left Entrance Stairs-Number of Steps: 3 Home Layout: One level Home Equipment: Walker - 2 wheels;Cane - single point      Prior Function Level of Independence: Independent with assistive device(s)         Comments: used quad cane for ambulation     Hand Dominance   Dominant Hand: Right  Extremity/Trunk Assessment   Upper Extremity Assessment Upper Extremity Assessment: Defer to OT evaluation    Lower Extremity Assessment Lower Extremity Assessment: Generalized weakness    Cervical / Trunk Assessment Cervical / Trunk  Assessment: Normal  Communication   Communication: No difficulties  Cognition Arousal/Alertness: Awake/alert Behavior During Therapy: WFL for tasks assessed/performed Overall Cognitive Status: Impaired/Different from baseline Area of Impairment: Attention;Memory;Following commands;Safety/judgement;Awareness;Problem solving                   Current Attention Level: Sustained Memory: Decreased short-term memory Following Commands: Follows one step commands consistently Safety/Judgement: Decreased awareness of safety;Decreased awareness of deficits Awareness: Intellectual Problem Solving: Slow processing;Requires verbal cues General Comments: Assessed with the Short Blessed Test of concentration adn Memory. Pt received a score of 10/28, placing her in the category  (10or more) which is consistent with impairment consistent wtih Dementia/need to further evaluate for dementing discorder. Pt was unablet o repeat the name and address correctly after 3 trials, had 2 errors when counting backward form 20 (eliminated 15;5), required prompts to understand how to say the months of the year in reverse order (began speelling July backwards) unable to recall 2/5 words form "repeat name/address". Poor insight/awareness into deficits. Pt also appeasr to be hallucinating, seeing a "kitten and [employer]" on the floor in her room. Pt additionally reporting that her neighbors face looked like a "Halloween face"/      General Comments General comments (skin integrity, edema, etc.): multiple wounds; skin tears; skin discoloration BLE; states she has an "infection" L lower leg; upon return to supine, pt with increased complaints of dizziness; again copmlained with dizziness with cahnge in position from supine to sit.    Exercises     Assessment/Plan    PT Assessment Patient needs continued PT services  PT Problem List Decreased strength;Decreased mobility;Decreased activity tolerance;Decreased  balance;Decreased knowledge of use of DME;Pain;Decreased safety awareness;Decreased cognition       PT Treatment Interventions DME instruction;Gait training;Therapeutic activities;Therapeutic exercise;Patient/family education;Balance training;Functional mobility training    PT Goals (Current goals can be found in the Care Plan section)  Acute Rehab PT Goals Patient Stated Goal: to get better and go home PT Goal Formulation: With patient Time For Goal Achievement: 03/25/20 Potential to Achieve Goals: Fair    Frequency Min 2X/week   Barriers to discharge Decreased caregiver support      Co-evaluation               AM-PAC PT "6 Clicks" Mobility  Outcome Measure Help needed turning from your back to your side while in a flat bed without using bedrails?: A Lot Help needed moving from lying on your back to sitting on the side of a flat bed without using bedrails?: A Lot Help needed moving to and from a bed to a chair (including a wheelchair)?: A Lot Help needed standing up from a chair using your arms (e.g., wheelchair or bedside chair)?: A Lot Help needed to walk in hospital room?: A Lot Help needed climbing 3-5 steps with a railing? : Total 6 Click Score: 11    End of Session Equipment Utilized During Treatment: Gait belt Activity Tolerance: Patient tolerated treatment well Patient left: in bed;with call bell/phone within reach   PT Visit Diagnosis: Muscle weakness (generalized) (M62.81);Difficulty in walking, not elsewhere classified (R26.2);History of falling (Z91.81);Repeated falls (R29.6)    Time: 2751-7001 PT Time Calculation (min) (ACUTE ONLY): 38 min   Charges:   PT Evaluation $PT Eval Low Complexity:  [place], PT Acute Rehabilitation  Office: [phone] Pager: [phone]

## 2020-03-11 NOTE — Progress Notes (Addendum)
Occupational Therapy Evaluation Patient Details Name: Katrina [name] MRN: [id number] DOB: 1932-07-07 Today's Date: 03/11/2020    History of Present Illness 86yo female with history of atrial fibrillation, DVT, PE on coumadin, hypertension, hypothyroidism who originally presented to the hospital 7/18 after being found down for 2-3 days. Pt had apparently rolled out of bed and was stuck between the wall and bed adn could not get up. Pt was discharged home. Per chart, on 7/20, pt called her neighbors after being unable to stand up from the couch at 4 am. Jilda Roche insisted she go to the ED. Found covered in urine and feces.    Clinical Impression   PTA, pt living at home alone, completing her own ADL and IADL tasks, in addition to driving, medication and financial management. Pt states she has been weaker over the past 2-3 weeks, however has difficulty accurately giving history. Required mod A +2 to mobilize to edge of stretcher due to generalized weakness and increased difficulty moving LLE. Once EOB, pt complained of dizziness. BP stable. Able to progress to [facility] with min A however required Total A for pericare/hygiene. When transferring back to stretcher, pt required Mod A +2 to prevent fall due to loss of balance. Pt assessed with the Short Blessed Test of Memory and Concentration as indicated below. Pt received a score of 10/28, which places her in the "Impairment Consistent with Dementia (evaluate for dementing disorder)" category. Pt with poor insight/awareness and reasoning regarding her deficits and her ability to safely care for herself. Pt also apparently hallucinating as she reports there is a "kitten and [employer]" on the floor in her room. Recommend rehab at SNF. Recommend  Pt's living situation be further assessed after rehab as she is unsafe to live alone at this time. Will follow acutely to maximize functional level of independence.   Pt reports dizzy/spinning feeling after returning to  supine form sitting. Recommend pt have further vestibular eval to assess for possibility of BPPV.     Follow Up Recommendations  SNF;Supervision/Assistance - 24 hour  Vestibular Eval   Equipment Recommendations  3 in 1 bedside commode    Recommendations for Other Services       Precautions / Restrictions Precautions Precautions: Fall Precaution Comments: multiple wounds Restrictions Weight Bearing Restrictions: No      Mobility Bed Mobility Overal bed mobility: Needs Assistance Bed Mobility: Supine to Sit;Sit to Supine     Supine to sit: Mod assist;+2 for physical assistance;+2 for safety/equipment Sit to supine: Mod assist;+2 for physical assistance;+2 for safety/equipment   General bed mobility comments: Assist for trunk and bil LEs. Increased time. Cues for safety, technique.  Transfers Overall transfer level: Needs assistance Equipment used: Rolling walker (2 wheeled) Transfers: Sit to/from Stand Sit to Stand: Mod assist;+2 safety/equipment Stand pivot transfers: +2 safety/equipment;Mod assist - due to loss of balance to prevent fall posteriorly       General transfer comment: +2 required to prevent fall    Balance Overall balance assessment: Needs assistance;History of Falls   Sitting balance-Leahy Scale: Fair     Standing balance support: Bilateral upper extremity supported Standing balance-Leahy Scale: Poor  reliant on external support                           ADL either performed or assessed with clinical judgement   ADL Overall ADL's : Needs assistance/impaired     Grooming: Set up;Sitting   Upper Body Bathing: Set  up;Supervision/ safety;Sitting   Lower Body Bathing: Maximal assistance;Sit to/from stand   Upper Body Dressing : Set up;Supervision/safety;Sitting   Lower Body Dressing: Maximal assistance;Sit to/from stand   Toilet Transfer: Moderate assistance;+2 for physical assistance;Stand-pivot (few steps; LOB when going back  to bed)   Toileting- Clothing Manipulation and Hygiene: Total assistance       Functional mobility during ADLs: Moderate assistance;+2 for physical assistance;Rolling walker;Cueing for safety       Vision Baseline Vision/History: Wears glasses Wears Glasses: Reading only Patient Visual Report: Other (comment);No change from baseline Additional Comments: Pt having apparent visual hallucinations; distortions of vision     Perception Perception Perception Tested?: Yes Perception Deficits: Spatial orientation Comments: increased difficulty with spatial relations with orientation with RW/moving self to [place] and back to bed   Praxis      Pertinent Vitals/Pain Pain Assessment: Faces Faces Pain Scale: Hurts little more Pain Location: L LE Pain Descriptors / Indicators: Guarding;Discomfort;Sore Pain Intervention(s): Limited activity within patient's tolerance;Monitored during session     Hand Dominance Right   Extremity/Trunk Assessment Upper Extremity Assessment Upper Extremity Assessment: Generalized weakness   Lower Extremity Assessment Lower Extremity Assessment: Generalized weakness; More difficulty moving LLE   Cervical / Trunk Assessment Cervical / Trunk Assessment: Normal   Communication Communication Communication: No difficulties   Cognition Arousal/Alertness: Awake/alert Behavior During Therapy: WFL for tasks assessed/performed Overall Cognitive Status: Impaired/Different from baseline Area of Impairment: Attention;Memory;Following commands;Safety/judgement;Awareness;Problem solving                   Current Attention Level: Sustained Memory: Decreased short-term memory Following Commands: Follows one step commands consistently Safety/Judgement: Decreased awareness of safety;Decreased awareness of deficits Awareness: Intellectual Problem Solving: Slow processing;Requires verbal cues General Comments: Assessed with the Short Blessed Test of  concentration and Memory. Pt received a score of 10/28, placing her in the category  which is consistent with "impairment consistent wtih Dementia/need to further evaluate for dementing discorder" (score of 10 or more). Pt was unable to repeat the name and address correctly after 3 trials, had 2 errors when counting backward from 20 (eliminated 15;5), required prompts to understand how to say the months of the year in reverse order (began speelling July backwards) unable to recall 2/5 words from "repeat name/address". Poor insight/awareness into deficits. Pt also appeasr to be hallucinating, seeing a "kitten and [employer]" on the floor in her room. Pt additionally reporting that her neighbors face looked like a "Halloween face"/   General Comments  multiple wounds; skin tears; skin discoloration BLE; states she has an "infection" L lower leg; upon return to supine, pt with increased complaints of dizziness; again copmlained with dizziness with change in position from supine to sit.    Exercises     Shoulder Instructions      Home Living Family/patient expects to be discharged to:: Unsure Living Arrangements: Alone Available Help at Discharge: Neighbor;Available PRN/intermittently Type of Home: Apartment Home Access: Stairs to enter Entrance Stairs-Number of Steps: 3 Entrance Stairs-Rails: Right;Left Home Layout: One level     Bathroom Shower/Tub: Occupational psychologist: Standard Bathroom Accessibility:  (only slide in sideways)   Home Equipment: Walker - 2 wheels;Cane - single point          Prior Functioning/Environment Level of Independence: Independent with assistive device(s)        Comments: used quad cane for ambulation        OT Problem List: Decreased strength;Decreased activity tolerance;Impaired balance (sitting and/or  standing);Impaired vision/perception;Decreased cognition;Decreased safety awareness;Decreased knowledge of use of DME or  AE;Obesity;Increased edema;Pain      OT Treatment/Interventions: Self-care/ADL training;Therapeutic exercise;DME and/or AE instruction;Therapeutic activities;Cognitive remediation/compensation;Visual/perceptual remediation/compensation;Patient/family education;Balance training    OT Goals(Current goals can be found in the care plan section) Acute Rehab OT Goals Patient Stated Goal: to get better and go home OT Goal Formulation: Patient unable to participate in goal setting Time For Goal Achievement: 03/25/20 Potential to Achieve Goals: Good  OT Frequency: Min 2X/week   Barriers to D/C: Decreased caregiver support          Co-evaluation              AM-PAC OT "6 Clicks" Daily Activity     Outcome Measure Help from another person eating meals?: None Help from another person taking care of personal grooming?: A Little Help from another person toileting, which includes using toliet, bedpan, or urinal?: A Lot Help from another person bathing (including washing, rinsing, drying)?: A Lot Help from another person to put on and taking off regular upper body clothing?: A Lot Help from another person to put on and taking off regular lower body clothing?: A Lot 6 Click Score: 15   End of Session Equipment Utilized During Treatment: Gait belt;Rolling walker Nurse Communication: Mobility status;Other (comment) (pt hallucinating)  Activity Tolerance: Patient tolerated treatment well Patient left: in bed;Other (comment) (stretcher)  OT Visit Diagnosis: Unsteadiness on feet (R26.81);Other abnormalities of gait and mobility (R26.89);Repeated falls (R29.6);Muscle weakness (generalized) (M62.81);History of falling (Z91.81);Other symptoms and signs involving cognitive function;Dizziness and giddiness (R42);Pain Pain - Right/Left: Left Pain - part of body: Leg                Time: 5638-7564 OT Time Calculation (min): 46 min Charges:  OT General Charges $OT Visit: 1 Visit OT Evaluation $OT  Eval Moderate Complexity: 1 Mod OT Treatments $Self Care/Home Management : 8-22 mins  Maurie Boettcher, OT/L   Acute OT Clinical Specialist Acute Rehabilitation Services Pager [phone] Office [phone]   [facility] 03/11/2020, 3:00 PM

## 2020-03-11 NOTE — ED Notes (Signed)
Lab notified of urine culture add on 

## 2020-03-12 LAB — PROTIME-INR
INR: 1.8 — ABNORMAL HIGH (ref 0.8–1.2)
Prothrombin Time: 20.3 seconds — ABNORMAL HIGH (ref 11.4–15.2)

## 2020-03-12 NOTE — Social Work (Signed)
TOC CSW reached out to [employer] [phone] at [facility].  Arbie Cookey stated she had not received pts referral and requested that it be faxed [phone] for review.  @11 :33fam CSW faxed information to [employer].  CSW will continue to follow for dc needs.  Lucerito Rosinski Tarpley-Carter, MSW, [place] ED Transitions of Care Clinical Social Worker [place] [phone]

## 2020-03-12 NOTE — ED Provider Notes (Signed)
Emergency Medicine Observation Re-evaluation Note  Katrina [name] is a 86 y.o. female, seen on rounds today.  Pt initially presented to the ED for complaints of Weakness Currently, the patient is medically clear, awaiting SNF placement by SW due to unsafe functioning at home.   Pt complaining of being thirsty and hungry. Provided w/ water and food tray.  Physical Exam  BP (!) 143/63   Pulse 74   Temp 97.8 F (36.6 C) (Oral)   Resp 17   Ht 5\' 1"  (1.549 m)   Wt 99.8 kg   LMP 01/22/2011   SpO2 99%   BMI 41.57 kg/m  Physical Exam  VS reviewed. Const: Awake, in bed, alert, NAD HENT: normocephalic and atraumatic Eyes: conjunctivae normal Pulm: normal WOB Neuro: A&Ox3, fluent speech Psych: mildly agitated  ED Course / MDM  EKG:EKG Interpretation  Date/Time:  Tuesday March 11 2020 06:38:46 EDT Ventricular Rate:  124 PR Interval:    QRS Duration: 131 QT Interval:  350 QTC Calculation: 503 R Axis:   95 Text Interpretation: Atrial fibrillation RBBB and LPFB Probable posterior infarct, acute 12 Lead; [place] now atrial fibrillation Confirmed by Ezequiel Essex [phone]) on 03/11/2020 6:43:52 AM    I have reviewed the labs performed to date as well as medications administered while in observation.  Plan  Current plan is for PT to remain in ED as SW refers out to facilities to find SNF placement. Continue oral Abx for UTI.     Kaylee Wombles, Wenda Overland, MD 03/12/20 [phone]

## 2020-03-12 NOTE — Progress Notes (Addendum)
CSW received a call from [place] from [place]:  Pt's auth#: [id number]  Approved for 3 days w/.start date of today (7/21).  Next Review Date: 7/23  Care Coordinator: Tylene Fantasia  At fax: [phone]  Pt will be going to [facility], [address], [place].  4:17 PM CSW Katrina [name], Admission Coordinator, contact information # [phone] and left the auth # for the pt in a HIPPA-compliant VM asking for a return call.  Per front desk at [facility] had left for the day.  CSW will continue to follow for D/C needs.  Katrina [name]  MSW, LCSW, LCAS, CCS Transitions of Care Clinical Social Worker Care Coordination Department Ph: [phone]

## 2020-03-12 NOTE — Social Work (Signed)
TOC CSW has obtained reference # from [place] [id number].  Start date of auth:  03/12/2020  Reference #:  [id number]  @9 :30am CSW produced a list of accepting SNF's to pt and Rayleigh Gillyard (family member) [phone].  Pt and Sadie prefers a SNF in or near [place], [place].  CSW will continue to follow for dc needs.  Latrese Carolan Tarpley-Carter, MSW, [place] ED Transitions of Engineer, building services Health [phone]

## 2020-03-12 NOTE — Progress Notes (Signed)
TOC CM contacted [facility] Medicare to update on facility that pt and family prefers, [facility], [address], [place]. Roselyn Meier, Admission Coordinator, contact information # [phone]. Waiting insurance auth for SNF. [place], [place] ED TOC CM [phone]

## 2020-03-12 NOTE — Social Work (Signed)
TOC CSW received a call from [employer] with a verbal acceptance of pt to her facility.    CSW contacted pt and Katrina [name] with the update that they had been accepted, but we are currently still awaiting authorization from [place].  CSW informed pt and Katrina that she would keep them updated on progress.  CSW will continue to follow for dc needs.  Skylynne Schlechter Tarpley-Carter, MSW, [place] ED Transitions of Engineer, building services Health [phone]

## 2020-03-12 NOTE — Social Work (Addendum)
TOC CSW went to update pt and Katrina [name] (family member). Katrina proceeded to give CSW information on SNF she wanted to add to the list as priority SNF.  CSW accepted Katrina and pts request.  Contact was for [facility] ([address], [place]), [employer] [place]/Admission Coordinator [phone].    CSW will continue to follow for dc needs.  Katrina [name], MSW, [place] ED Transitions of Engineer, building services Health [phone]

## 2020-03-13 LAB — PROTIME-INR
INR: 2 — ABNORMAL HIGH (ref 0.8–1.2)
Prothrombin Time: 22.3 seconds — ABNORMAL HIGH (ref 11.4–15.2)

## 2020-03-13 LAB — URINE CULTURE: Culture: >=100000 — AB

## 2020-03-13 NOTE — Social Work (Signed)
TOC CSW updated Arbie Cookey [place]/Admission Coordinator([phone] at [facility] on auth#.  Arbie Cookey then provided CSW with room # 114, call report [phone], and fax # [phone].  AVS has been printed and faxed to Arbie Cookey at [facility].    Please reconsult if new social work issues arise, CSW signing off.  Raquon Milledge Tarpley-Carter, MSW, [place] ED Transitions of Engineer, building services Health [phone]

## 2020-03-13 NOTE — ED Notes (Signed)
Attempted to call report to [facility].  They took call back number and stated someone would have to call me back.

## 2020-03-13 NOTE — ED Notes (Signed)
Report called to Montine Circle at [facility].  PTAR present to transport patient.

## 2020-03-13 NOTE — ED Notes (Signed)
PTAR called for transport.  

## 2020-03-15 ENCOUNTER — Telehealth (HOSPITAL_BASED_OUTPATIENT_CLINIC_OR_DEPARTMENT_OTHER): Payer: Self-pay | Admitting: Emergency Medicine

## 2020-03-15 NOTE — Telephone Encounter (Signed)
Post ED Visit - Positive Culture Follow-up  Culture report reviewed by antimicrobial stewardship pharmacist: [place] Team []  Elenor Quinones, Pharm.D. []  Heide Guile, Pharm.D., BCPS AQ-ID []  Parks Neptune, Pharm.D., BCPS []  Alycia Rossetti, Pharm.D., BCPS []  [place], [place].D., BCPS, AAHIVP []  Legrand Como, Pharm.D., BCPS, AAHIVP []  Salome Arnt, PharmD, BCPS []  Johnnette Gourd, PharmD, BCPS []  Hughes Better, PharmD, BCPS []  Leeroy Cha, PharmD []  Laqueta Linden, PharmD, BCPS []  Albertina Parr, PharmD  [place] Team []  Leodis Sias, PharmD []  Lindell Spar, PharmD []  Royetta Asal, PharmD []  Graylin Shiver, Rph []  Rema Fendt) Glennon Mac, PharmD [x]  Arlyn Dunning, PharmD []  Netta Cedars, PharmD []  Dia Sitter, PharmD []  Leone Haven, PharmD []  Gretta Arab, PharmD []  Theodis Shove, PharmD []  Peggyann Juba, PharmD []  Reuel Boom, PharmD   Positive urine culture Treated with Cephalexin, organism sensitive to the same and no further patient follow-up is required at this time.  Sandi Raveling Katessa Attridge 03/15/2020, 10:13 AM

## 2020-05-14 ENCOUNTER — Encounter: Payer: Self-pay | Admitting: Podiatry

## 2020-05-14 ENCOUNTER — Ambulatory Visit (INDEPENDENT_AMBULATORY_CARE_PROVIDER_SITE_OTHER): Payer: Medicare Other | Admitting: Podiatry

## 2020-05-14 ENCOUNTER — Other Ambulatory Visit: Payer: Self-pay

## 2020-05-14 DIAGNOSIS — D689 Coagulation defect, unspecified: Secondary | ICD-10-CM

## 2020-05-14 DIAGNOSIS — B351 Tinea unguium: Secondary | ICD-10-CM | POA: Diagnosis not present

## 2020-05-14 DIAGNOSIS — I739 Peripheral vascular disease, unspecified: Secondary | ICD-10-CM | POA: Diagnosis not present

## 2020-05-14 DIAGNOSIS — M79674 Pain in right toe(s): Secondary | ICD-10-CM | POA: Diagnosis not present

## 2020-05-14 DIAGNOSIS — M79675 Pain in left toe(s): Secondary | ICD-10-CM | POA: Diagnosis not present

## 2020-05-14 DIAGNOSIS — I890 Lymphedema, not elsewhere classified: Secondary | ICD-10-CM

## 2020-05-14 NOTE — Progress Notes (Signed)
This patient returns to my office for at risk foot care.  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 Patient presents to the office with a caregiver in a wheelchair. This patient presents for at risk foot care today.  General Appearance  Alert, conversant and in no acute stress.  Vascular  Dorsalis pedis and posterior tibial  pulses are palpable  bilaterally.  Capillary return is within normal limits  bilaterally. Temperature is within normal limits  bilaterally.  Neurologic  Senn-Weinstein monofilament wire test within normal limits  bilaterally. Muscle power within normal limits bilaterally.  Nails Thick disfigured discolored nails with subungual debris  from hallux to fifth toes bilaterally. No evidence of bacterial infection or drainage bilaterally.  Orthopedic  No limitations of motion  feet .  No crepitus or effusions noted.  No bony pathology or digital deformities noted.Fused 1st MPJ right.  HAV  Left foot.  Skin  normotropic skin with no porokeratosis noted bilaterally.  No signs of infections or ulcers noted.     Onychomycosis  Pain in right toes  Pain in left toes  Consent was obtained for treatment procedures.   Mechanical debridement of nails 1-5  bilaterally performed with a nail nipper.  Filed with dremel without incident.    Return office visit    prn                 Told patient to return for periodic foot care and evaluation due to potential at risk complications.   Gardiner Barefoot DPM

## 2020-07-16 ENCOUNTER — Encounter: Payer: Self-pay | Admitting: Gastroenterology

## 2020-08-06 ENCOUNTER — Ambulatory Visit: Payer: Medicare Other | Admitting: Gastroenterology

## 2020-08-25 ENCOUNTER — Ambulatory Visit: Payer: Medicare Other | Admitting: Obstetrics & Gynecology

## 2020-11-04 ENCOUNTER — Ambulatory Visit (INDEPENDENT_AMBULATORY_CARE_PROVIDER_SITE_OTHER): Payer: Medicare Other | Admitting: Podiatry

## 2020-11-04 ENCOUNTER — Encounter: Payer: Self-pay | Admitting: Podiatry

## 2020-11-04 ENCOUNTER — Other Ambulatory Visit: Payer: Self-pay

## 2020-11-04 DIAGNOSIS — M79675 Pain in left toe(s): Secondary | ICD-10-CM

## 2020-11-04 DIAGNOSIS — B351 Tinea unguium: Secondary | ICD-10-CM | POA: Diagnosis not present

## 2020-11-04 DIAGNOSIS — M79674 Pain in right toe(s): Secondary | ICD-10-CM

## 2020-11-09 NOTE — Progress Notes (Signed)
Subjective:  Patient ID: Katrina [name], female    DOB: Nov 07, 1931,  MRN: [id number]  Keren D Garate presents to clinic today for at risk foot care with h/o clotting disorder and painful thick toenails that are difficult to trim. Pain interferes with ambulation. Aggravating factors include wearing enclosed shoe gear. Pain is relieved with periodic professional debridement..   Current Outpatient Medications:  .  apixaban (ELIQUIS) 2.5 MG TABS tablet, take 1 tablet, Disp: , Rfl:  .  furosemide (LASIX) 40 MG tablet, TAKE ONE TABLET BY MOUTH EVERY MORNING WITH POTASSIUM, Disp: , Rfl:  .  potassium chloride (KLOR-CON 10) 10 MEQ tablet, 1 tablet with food (goes with furosemide), Disp: , Rfl:  .  atorvastatin (LIPITOR) 40 MG tablet, Take 1 tablet by mouth daily., Disp: , Rfl:  .  digoxin (LANOXIN) 0.25 MG tablet, Take 1 tablet by mouth daily., Disp: , Rfl:  .  doxycycline (VIBRA-TABS) 100 MG tablet, Take 100 mg by mouth 2 (two) times daily., Disp: , Rfl:  .  ELIQUIS 2.5 MG TABS tablet, Take 2.5 mg by mouth 2 (two) times daily., Disp: , Rfl:  .  fluconazole (DIFLUCAN) 150 MG tablet, 1 tablet, Disp: , Rfl:  .  hydrALAZINE (APRESOLINE) 50 MG tablet, Take 50 mg by mouth 3 (three) times daily. Twice daily, Disp: , Rfl:  .  levothyroxine (SYNTHROID, LEVOTHROID) 125 MCG tablet, Take 125 mcg by mouth daily. , Disp: , Rfl:  .  LOSARTAN POTASSIUM PO, Take 25 mg by mouth., Disp: , Rfl:  .  metoprolol succinate (TOPROL-XL) 100 MG 24 hr tablet, Take 100 mg by mouth daily., Disp: , Rfl: 0 .  mupirocin ointment (BACTROBAN) 2 %, Apply to affected area, Disp: , Rfl:  .  ondansetron (ZOFRAN) 4 MG tablet, Take 4 mg by mouth every 8 (eight) hours as needed for nausea or vomiting., Disp: , Rfl:  .  potassium citrate (UROCIT-K) 10 MEQ (1080 MG) SR tablet, Take 10 mEq by mouth daily., Disp: , Rfl:  .  warfarin (COUMADIN) 1 MG tablet, Take 1 mg by mouth daily., Disp: , Rfl:  .  warfarin (COUMADIN) 2 MG tablet, Take by mouth.,  Disp: , Rfl:   Allergies  Allergen Reactions  . Cephalosporins     Other reaction(s): Unknown  . Codeine     Other reaction(s): nausea  . Penicillins     Mother and brother have allergy, patient just does not take this Did PCN reaction causing immediate rash, facial/tongue/throat swelling, SOB or lightheadedness with hypotension: unknown Did PCN reaction causing severe rash involving mucus membranes or skin necrosis:unknown Did PCN reaction that required hospitalization : unknown Did PCN reaction occurring within the last 10 years: unknown Pt never actually took med. Did okay with ampicillin  . Penicillin G     Pt states not allergic just family history of allergies to PCN    Review of Systems: Negative except as noted in the HPI. Objective:   Constitutional Rosabel D Perin is a pleasant 87 y.o. Caucasian female, in NAD. AAO x 3.   Vascular Capillary refill time to digits immediate b/l. Palpable pedal pulses b/l LE. Pedal hair sparse. Lower extremity skin temperature gradient within normal limits. No cyanosis or clubbing noted.  Neurologic Normal speech. Oriented to person, place, and time. Protective sensation intact 5/5 intact bilaterally with 10g monofilament b/l. Vibratory sensation intact b/l.  Dermatologic Pedal skin with normal turgor, texture and tone bilaterally. No open wounds bilaterally. No interdigital macerations bilaterally. Toenails 1-5  b/l elongated, discolored, dystrophic, thickened, crumbly with subungual debris and tenderness to dorsal palpation.  Orthopedic: Normal muscle strength 5/5 to all lower extremity muscle groups bilaterally. No pain crepitus or joint limitation noted with ROM b/l. Hallux valgus with bunion deformity noted left lower extremity. Fused 1st MPJ RLE.   Radiographs: None Assessment:   1. Pain due to onychomycosis of toenails of both feet    Plan:  Patient was evaluated and treated and all questions answered.  Onychomycosis with pain -Nails  palliatively debridement as below -Educated on self-care  Procedure: Nail Debridement Rationale: Pain Type of Debridement: manual, sharp debridement. Instrumentation: Nail nipper, rotary burr. Number of Nails: 10 -Examined patient. -No new findings. No new orders. -Patient to continue soft, supportive shoe gear daily. -Toenails 1-5 b/l were debrided in length and girth with sterile nail nippers and dremel without iatrogenic bleeding.  -Patient to report any pedal injuries to medical professional immediately. -Patient/POA to call should there be question/concern in the interim.  Return in about 3 months (around 02/04/2021).  Marzetta Board, DPM

## 2021-02-18 ENCOUNTER — Other Ambulatory Visit: Payer: Self-pay

## 2021-02-18 ENCOUNTER — Ambulatory Visit: Payer: Medicare Other | Admitting: Podiatry

## 2021-02-18 ENCOUNTER — Encounter: Payer: Self-pay | Admitting: Podiatry

## 2021-02-18 DIAGNOSIS — M79674 Pain in right toe(s): Secondary | ICD-10-CM | POA: Diagnosis not present

## 2021-02-18 DIAGNOSIS — R5381 Other malaise: Secondary | ICD-10-CM | POA: Insufficient documentation

## 2021-02-18 DIAGNOSIS — L89153 Pressure ulcer of sacral region, stage 3: Secondary | ICD-10-CM | POA: Insufficient documentation

## 2021-02-18 DIAGNOSIS — D649 Anemia, unspecified: Secondary | ICD-10-CM | POA: Insufficient documentation

## 2021-02-18 DIAGNOSIS — M79675 Pain in left toe(s): Secondary | ICD-10-CM | POA: Diagnosis not present

## 2021-02-18 DIAGNOSIS — N390 Urinary tract infection, site not specified: Secondary | ICD-10-CM | POA: Insufficient documentation

## 2021-02-18 DIAGNOSIS — B372 Candidiasis of skin and nail: Secondary | ICD-10-CM | POA: Insufficient documentation

## 2021-02-18 DIAGNOSIS — R600 Localized edema: Secondary | ICD-10-CM | POA: Insufficient documentation

## 2021-02-18 DIAGNOSIS — L03115 Cellulitis of right lower limb: Secondary | ICD-10-CM | POA: Insufficient documentation

## 2021-02-18 DIAGNOSIS — J189 Pneumonia, unspecified organism: Secondary | ICD-10-CM | POA: Insufficient documentation

## 2021-02-18 DIAGNOSIS — Z659 Problem related to unspecified psychosocial circumstances: Secondary | ICD-10-CM | POA: Insufficient documentation

## 2021-02-18 DIAGNOSIS — D6869 Other thrombophilia: Secondary | ICD-10-CM | POA: Insufficient documentation

## 2021-02-18 DIAGNOSIS — R269 Unspecified abnormalities of gait and mobility: Secondary | ICD-10-CM | POA: Insufficient documentation

## 2021-02-18 DIAGNOSIS — B351 Tinea unguium: Secondary | ICD-10-CM | POA: Diagnosis not present

## 2021-02-18 DIAGNOSIS — I87321 Chronic venous hypertension (idiopathic) with inflammation of right lower extremity: Secondary | ICD-10-CM | POA: Insufficient documentation

## 2021-02-18 DIAGNOSIS — K625 Hemorrhage of anus and rectum: Secondary | ICD-10-CM | POA: Insufficient documentation

## 2021-02-18 DIAGNOSIS — S31829A Unspecified open wound of left buttock, initial encounter: Secondary | ICD-10-CM | POA: Insufficient documentation

## 2021-02-18 DIAGNOSIS — G9341 Metabolic encephalopathy: Secondary | ICD-10-CM | POA: Insufficient documentation

## 2021-02-18 DIAGNOSIS — R531 Weakness: Secondary | ICD-10-CM | POA: Insufficient documentation

## 2021-02-18 DIAGNOSIS — A419 Sepsis, unspecified organism: Secondary | ICD-10-CM | POA: Insufficient documentation

## 2021-02-18 DIAGNOSIS — N179 Acute kidney failure, unspecified: Secondary | ICD-10-CM | POA: Insufficient documentation

## 2021-02-18 DIAGNOSIS — D500 Iron deficiency anemia secondary to blood loss (chronic): Secondary | ICD-10-CM | POA: Insufficient documentation

## 2021-02-18 DIAGNOSIS — J9600 Acute respiratory failure, unspecified whether with hypoxia or hypercapnia: Secondary | ICD-10-CM | POA: Insufficient documentation

## 2021-02-19 NOTE — Progress Notes (Signed)
Subjective: Katrina [name] is a pleasant 87 y.o. female patient seen today painful thick toenails that are difficult to trim. Pain interferes with ambulation. Aggravating factors include wearing enclosed shoe gear. Pain is relieved with periodic professional debridement.  She voices no new pedal problems on today's visit.  Allergies  Allergen Reactions   Cephalosporins     Other reaction(s): Unknown   Codeine     Other reaction(s): nausea   Penicillins     Mother and brother have allergy, patient just does not take this Did PCN reaction causing immediate rash, facial/tongue/throat swelling, SOB or lightheadedness with hypotension: unknown Did PCN reaction causing severe rash involving mucus membranes or skin necrosis:unknown Did PCN reaction that required hospitalization : unknown Did PCN reaction occurring within the last 10 years: unknown Pt never actually took med. Did okay with ampicillin   Penicillin G     Pt states not allergic just family history of allergies to PCN    Objective: Physical Exam  General: Katrina [name] is a pleasant 87 y.o. Caucasian female, in NAD. AAO x 3.   Vascular:  Capillary refill time to digits immediate b/l. Palpable DP pulse(s) b/l lower extremities Palpable PT pulse(s) b/l lower extremities Pedal hair sparse. Lower extremity skin temperature gradient within normal limits.  Dermatological:  Pedal skin with normal turgor, texture and tone b/l lower extremities No open wounds b/l lower extremities No interdigital macerations b/l lower extremities Toenails 1-5 b/l elongated, discolored, dystrophic, thickened, crumbly with subungual debris and tenderness to dorsal palpation.  Musculoskeletal:  Normal muscle strength 5/5 to all lower extremity muscle groups bilaterally. Fused 1st MPJ right foot. No pain crepitus or joint limitation noted with ROM b/l. Hallux valgus with bunion deformity noted b/l lower extremities.  Neurological:  Protective sensation intact  5/5 intact bilaterally with 10g monofilament b/l. Vibratory sensation intact b/l.  Assessment and Plan:  1. Pain due to onychomycosis of toenails of both feet     -Examined patient. -Patient to continue soft, supportive shoe gear daily. -Toenails 1-5 b/l were debrided in length and girth with sterile nail nippers and dremel without iatrogenic bleeding.  -Patient to report any pedal injuries to medical professional immediately. -Patient/POA to call should there be question/concern in the interim.  Return in about 3 months (around 05/21/2021).  Marzetta Board, DPM

## 2021-05-10 ENCOUNTER — Other Ambulatory Visit: Payer: Self-pay

## 2021-05-10 ENCOUNTER — Encounter (HOSPITAL_COMMUNITY): Payer: Self-pay

## 2021-05-10 ENCOUNTER — Emergency Department (HOSPITAL_COMMUNITY)
Admission: EM | Admit: 2021-05-10 | Discharge: 2021-05-11 | Disposition: A | Discharge status: Home / Self Care | Payer: Medicare Other | Attending: Emergency Medicine | Admitting: Emergency Medicine

## 2021-05-10 ENCOUNTER — Emergency Department (HOSPITAL_COMMUNITY): Payer: Medicare Other

## 2021-05-10 DIAGNOSIS — Z7901 Long term (current) use of anticoagulants: Secondary | ICD-10-CM | POA: Diagnosis not present

## 2021-05-10 DIAGNOSIS — Z85820 Personal history of malignant melanoma of skin: Secondary | ICD-10-CM | POA: Diagnosis not present

## 2021-05-10 DIAGNOSIS — S0990XA Unspecified injury of head, initial encounter: Secondary | ICD-10-CM | POA: Insufficient documentation

## 2021-05-10 DIAGNOSIS — S301XXA Contusion of abdominal wall, initial encounter: Secondary | ICD-10-CM | POA: Diagnosis not present

## 2021-05-10 DIAGNOSIS — Z96653 Presence of artificial knee joint, bilateral: Secondary | ICD-10-CM | POA: Insufficient documentation

## 2021-05-10 DIAGNOSIS — T07XXXA Unspecified multiple injuries, initial encounter: Secondary | ICD-10-CM

## 2021-05-10 DIAGNOSIS — I10 Essential (primary) hypertension: Secondary | ICD-10-CM | POA: Diagnosis not present

## 2021-05-10 DIAGNOSIS — W010XXA Fall on same level from slipping, tripping and stumbling without subsequent striking against object, initial encounter: Secondary | ICD-10-CM | POA: Diagnosis not present

## 2021-05-10 DIAGNOSIS — Z79899 Other long term (current) drug therapy: Secondary | ICD-10-CM | POA: Insufficient documentation

## 2021-05-10 DIAGNOSIS — S3991XA Unspecified injury of abdomen, initial encounter: Secondary | ICD-10-CM | POA: Diagnosis present

## 2021-05-10 DIAGNOSIS — S40019A Contusion of unspecified shoulder, initial encounter: Secondary | ICD-10-CM | POA: Diagnosis not present

## 2021-05-10 DIAGNOSIS — W19XXXA Unspecified fall, initial encounter: Secondary | ICD-10-CM

## 2021-05-10 DIAGNOSIS — E039 Hypothyroidism, unspecified: Secondary | ICD-10-CM | POA: Diagnosis not present

## 2021-05-10 LAB — CBC WITH DIFFERENTIAL/PLATELET
Abs Immature Granulocytes: 0.07 10*3/uL (ref 0.00–0.07)
Basophils Absolute: 0 10*3/uL (ref 0.0–0.1)
Basophils Relative: 0 %
Eosinophils Absolute: 0.1 10*3/uL (ref 0.0–0.5)
Eosinophils Relative: 1 %
HCT: 35.6 % — ABNORMAL LOW (ref 36.0–46.0)
Hemoglobin: 10.7 g/dL — ABNORMAL LOW (ref 12.0–15.0)
Immature Granulocytes: 1 %
Lymphocytes Relative: 9 %
Lymphs Abs: 1.3 10*3/uL (ref 0.7–4.0)
MCH: 27.4 pg (ref 26.0–34.0)
MCHC: 30.1 g/dL (ref 30.0–36.0)
MCV: 91 fL (ref 80.0–100.0)
Monocytes Absolute: 0.9 10*3/uL (ref 0.1–1.0)
Monocytes Relative: 7 %
Neutro Abs: 11 10*3/uL — ABNORMAL HIGH (ref 1.7–7.7)
Neutrophils Relative %: 82 %
Platelets: 297 10*3/uL (ref 150–400)
RBC: 3.91 MIL/uL (ref 3.87–5.11)
RDW: 16.6 % — ABNORMAL HIGH (ref 11.5–15.5)
WBC: 13.3 10*3/uL — ABNORMAL HIGH (ref 4.0–10.5)
nRBC: 0 % (ref 0.0–0.2)

## 2021-05-10 LAB — BASIC METABOLIC PANEL
Anion gap: 10 (ref 5–15)
BUN: 13 mg/dL (ref 8–23)
CO2: 21 mmol/L — ABNORMAL LOW (ref 22–32)
Calcium: 8.3 mg/dL — ABNORMAL LOW (ref 8.9–10.3)
Chloride: 105 mmol/L (ref 98–111)
Creatinine, Ser: 0.8 mg/dL (ref 0.44–1.00)
GFR, Estimated: >60 mL/min (ref >60)
Glucose, Bld: 157 mg/dL — ABNORMAL HIGH (ref 70–99)
Potassium: 5.1 mmol/L (ref 3.5–5.1)
Sodium: 136 mmol/L (ref 135–145)

## 2021-05-10 LAB — CK: Total CK: 242 U/L — ABNORMAL HIGH (ref 38–234)

## 2021-05-10 LAB — PROTIME-INR
INR: 1.3 — ABNORMAL HIGH (ref 0.8–1.2)
Prothrombin Time: 16.3 seconds — ABNORMAL HIGH (ref 11.4–15.2)

## 2021-05-10 MED ORDER — SODIUM CHLORIDE 0.9 % IV BOLUS
1000.0000 mL | Freq: Once | INTRAVENOUS | Status: AC
  Administered 2021-05-10: 1000 mL via INTRAVENOUS

## 2021-05-10 NOTE — ED Provider Notes (Signed)
Signout from Dr. Kathrynn Humble.  87 year old female who lives in independent living had a fall yesterday and was unable to get up until found today.  She has had x-rays and imaging.  Plan is to ambulate and if safe she can be returned back to her facility. Physical Exam  BP (!) 175/96 (BP Location: Left Arm)   Pulse 95   Temp (!) 97.4 F (36.3 C) (Oral)   Resp 16   Ht '5\' 1"'$  (1.549 m)   Wt 100.7 kg   LMP 01/22/2011   SpO2 98%   BMI 41.95 kg/m   Physical Exam  ED Course/Procedures     Procedures  MDM  Nurse that patient ambulated well with walker.  Patient states she feels comfortable going home.  She said her neighbors can give her a lot of help and she has somebody coming in to help her with her leg wounds, chronic.  Awaiting Kem Parkinson, MD 05/11/21 [phone]

## 2021-05-10 NOTE — ED Notes (Addendum)
Provided pericare and change brief. When changing pt she had a pressure band aide on buttocks. Pt stated wound nurse is taking care of wounds on her bottom and legs. Pt has noticeable redness underneath stomach folds and between thigh folds.

## 2021-05-10 NOTE — ED Notes (Signed)
Provided pt with beverage.

## 2021-05-10 NOTE — ED Notes (Signed)
Patient transported to CT 

## 2021-05-10 NOTE — Discharge Instructions (Addendum)
We saw you in the ER after you had a fall. All the imaging results are normal, no fractures seen. No evidence of brain bleed. Please be very careful with walking, and do everything possible to prevent falls.  You will likely be sore, please take Tylenol

## 2021-05-10 NOTE — ED Notes (Signed)
Pt was able to ambulate with a walker. Will discharge and call PTAR.

## 2021-05-10 NOTE — ED Notes (Signed)
Patient removed own IV. Stated that she thought she was leaving soon and got anxious so she thought she could take it out. IV intact, 2x2 applied to area.

## 2021-05-10 NOTE — ED Triage Notes (Signed)
[facility]IB [facility] Pt states she fell last night around 7pm but was not found until around 11am this morning. Pt denies LOC or hitting her head. Pt normally walks with a cane but is not able to stand at the moment. Pt states the only pain she had was lower back pain but it has went away but still not able to stand.

## 2021-05-10 NOTE — ED Provider Notes (Signed)
[facility] EMERGENCY DEPARTMENT Provider Note   CSN: [id number] Arrival date & time: 05/10/21  1202     History Chief Complaint  Patient presents with  . Fall    Katrina [name] is a 87 y.o. female.  HPI     87 year old female with history of A. fib, DVT/PE on Eliquis comes in with chief complaint of fall.  Patient had a mechanical fall last night around 7 PM.  She lives in an assisted living.  She reports that she was discovered around 11 AM, and EMS was called.  Patient states that the fall was mechanical, she tripped and fell like a rug was pulled underneath her.  She did not faint before or after the fall.  She was unable to get up and laid on the floor all night for patient is complaining of some pain over the right lateral abdomen.  No severe headaches, neck pain, numbness, tingling.  She is also feeling sore over her her shoulder region.  Past Medical History:  Diagnosis Date  . Atrial fibrillation ([place])   . DVT (deep venous thrombosis) ([place])   . Elevated lipids   . Gallstone   . Hypertension   . Hypothyroidism   . Nephrolithiasis   . Overactive bladder   . Pulmonary emboli ([facility])    while on HRT  . SCC (squamous cell carcinoma)    of left leg  . Sleep apnea    on C-pap  . Vertigo     Patient Active Problem List   Diagnosis Date Noted  . Acquired thrombophilia ([place]) 02/18/2021  . Acute renal failure syndrome ([place]) 02/18/2021  . Acute respiratory failure ([place]) 02/18/2021  . Anemia 02/18/2021  . [place]idal intertrigo 02/18/2021  . Cellulitis of right lower limb 02/18/2021  . Chronic venous hypertension (idiopathic) with inflammation of right lower extremity 02/18/2021  . Gait abnormality 02/18/2021  . General weakness 02/18/2021  . Iron deficiency anemia due to chronic blood loss 02/18/2021  . Localized edema 02/18/2021  . Metabolic encephalopathy [id number]  . Pneumonia 02/18/2021  . Pressure injury of sacral region, stage 3 ([place]) 02/18/2021   . Problem related to unspecified psychosocial circumstances 02/18/2021  . Rectal bleeding 02/18/2021  . Sepsis ([place]) 02/18/2021  . Urinary tract infectious disease 02/18/2021  . Unspecified open wound of left buttock, initial encounter 02/18/2021  . Local infection of the skin and subcutaneous tissue, unspecified 11/08/2019  . Right lower quadrant pain 11/08/2019  . Paroxysmal atrial fibrillation ([place]) 01/04/2019  . Disorder of the skin and subcutaneous tissue, unspecified 11/07/2017  . Sleep apnea 01/10/2017  . Hypertension 01/10/2017  . Hypothyroidism 01/10/2017  . Overactive bladder 01/10/2017  . Vertigo 01/10/2017  . Elevated lipids 01/10/2017  . Disequilibrium 10/05/2016  . Nasal congestion 08/26/2016  . Morbid obesity ([place]) 10/03/2015  . Encounter for general adult medical examination without abnormal findings 09/26/2015  . Impaired fasting glucose 09/26/2014  . Long term (current) use of anticoagulants 09/26/2014  . Varicose veins of lower extremity with ulcer ([place]) 05/02/2013  . 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  . Underimmunization status 05/04/2010  . Personal history of other diseases of urinary system 01/28/2010  . Thromboembolism of vein 09/09/2009  . History of gastrointestinal disease 07/28/2009  . Major depression, single episode 07/28/2009  . Peripheral venous insufficiency 07/28/2009  . Supraventricular tachycardia ([place]) 07/28/2009    Past Surgical History:  Procedure Laterality Date  . CHOLECYSTECTOMY     Dr. Zenia Resides  . CYSTOSCOPY WITH STENT  PLACEMENT  9/12   nephrolithaisis  . HYSTEROSCOPY WITH D & C  7/09   PMP with endometrial polyp  . REPLACEMENT TOTAL KNEE Bilateral 1998, 2001      . TONSILLECTOMY AND ADENOIDECTOMY       OB History     Gravida  0   Para  0   Term  0   Preterm  0   AB  0   Living  0      SAB  0   IAB  0   Ectopic  0   Multiple  0   Live Births              Family History  Problem  Relation Age of Onset  . Hypertension Father   . Heart disease Father   . Hypertension Mother   . Hyperlipidemia Mother   . Heart attack Mother   . Prostate cancer Brother     Social History   Tobacco Use  . Smoking status: Never  . Smokeless tobacco: Never  Vaping Use  . Vaping Use: Never used  Substance Use Topics  . Alcohol use: No    Alcohol/week: 0.0 standard drinks  . Drug use: No    Home Medications Prior to Admission medications   Medication Sig Start Date End Date Taking? Authorizing Provider  apixaban (ELIQUIS) 2.5 MG TABS tablet Take 1 tablet by mouth 2 (two) times daily.    [provider]  atorvastatin (LIPITOR) 40 MG tablet Take 1 tablet by mouth daily.    [provider]  digoxin (LANOXIN) 0.25 MG tablet Take 1 tablet by mouth daily.    [provider]  doxycycline (VIBRA-TABS) 100 MG tablet Take 100 mg by mouth 2 (two) times daily. 10/02/20   [provider]  fluconazole (DIFLUCAN) 150 MG tablet 1 tablet    [provider]  fluconazole (DIFLUCAN) 150 MG tablet 1 tablet 12/01/20   [provider]  furosemide (LASIX) 20 MG tablet 1 tablet 02/04/21   [provider]  hydrALAZINE (APRESOLINE) 50 MG tablet Take 50 mg by mouth 3 (three) times daily. Twice daily    [provider]  levothyroxine (SYNTHROID) 125 MCG tablet Take 1 tablet by mouth daily.    [provider]  levothyroxine (SYNTHROID, LEVOTHROID) 125 MCG tablet Take 125 mcg by mouth daily.     [provider]  LOSARTAN POTASSIUM PO Take 25 mg by mouth.    [provider]  metoprolol succinate (TOPROL-XL) 100 MG 24 hr tablet Take 1 tablet by mouth daily.    [provider]  mupirocin ointment (BACTROBAN) 2 % Apply to affected area    [provider]  ondansetron (ZOFRAN) 4 MG tablet Take 4 mg by mouth every 8 (eight) hours as needed for nausea or vomiting.    [provider]  potassium  chloride (KLOR-CON) 10 MEQ tablet 1 tablet with food 02/04/21   [provider]  potassium citrate (UROCIT-K) 10 MEQ (1080 MG) SR tablet Take 10 mEq by mouth daily. 06/06/20   [provider]  Prenatal Vit-Fe Fumarate-FA (PRENATAL VITAMIN PLUS LOW IRON) 27-1 MG TABS 1 tablet 02/04/21   [provider]  warfarin (COUMADIN) 1 MG tablet Take 1 mg by mouth daily.    [provider]  warfarin (COUMADIN) 2 MG tablet Take by mouth. 04/02/20   [provider]    Allergies    Cephalosporins, Codeine, Penicillins, and Penicillin g  Review of Systems  Review of Systems  Constitutional:  Positive for activity change.  Respiratory:  Negative for shortness of breath.   Cardiovascular:  Negative for chest pain.  Musculoskeletal:  Positive for arthralgias and myalgias.  Hematological:  Bruises/bleeds easily.  All other systems reviewed and are negative.  Physical Exam Updated Vital Signs BP (!) 145/63   Pulse 97   Temp 97.7 F (36.5 C) (Oral)   Resp 17   Ht '5\' 1"'$  (1.549 m)   Wt 100.7 kg   LMP 01/22/2011   SpO2 100%   BMI 41.95 kg/m   Physical Exam Vitals and nursing note reviewed.  Constitutional:      Appearance: She is well-developed.  HENT:     Head: Normocephalic and atraumatic.  Eyes:     Extraocular Movements: Extraocular movements intact.     Pupils: Pupils are equal, round, and reactive to light.  Neck:     Comments: No midline c-spine tenderness Cardiovascular:     Rate and Rhythm: Normal rate and regular rhythm.  Pulmonary:     Effort: Pulmonary effort is normal. No respiratory distress.     Breath sounds: Normal breath sounds.  Chest:     Chest wall: No tenderness.  Abdominal:     General: Bowel sounds are normal. There is no distension.     Palpations: Abdomen is soft.     Tenderness: There is abdominal tenderness. There is no guarding or rebound.  Musculoskeletal:        General: Swelling present. No tenderness or  deformity.     Cervical back: Neck supple. No tenderness.     Comments: No long bone tenderness - upper and lower extrmeities and no pelvic pain, instability.  Skin:    General: Skin is warm and dry.     Findings: No rash.  Neurological:     Mental Status: She is alert and oriented to person, place, and time.     Cranial Nerves: No cranial nerve deficit.    ED Results / Procedures / Treatments   Labs (all labs ordered are listed, but only abnormal results are displayed) Labs Reviewed  BASIC METABOLIC PANEL - Abnormal; Notable for the following components:      Result Value   CO2 21 (*)    Glucose, Bld 157 (*)    Calcium 8.3 (*)    All other components within normal limits  CBC WITH DIFFERENTIAL/PLATELET - Abnormal; Notable for the following components:   WBC 13.3 (*)    Hemoglobin 10.7 (*)    HCT 35.6 (*)    RDW 16.6 (*)    Neutro Abs 11.0 (*)    All other components within normal limits  PROTIME-INR - Abnormal; Notable for the following components:   Prothrombin Time 16.3 (*)    INR 1.3 (*)    All other components within normal limits  CK - Abnormal; Notable for the following components:   Total CK 242 (*)    All other components within normal limits    EKG None  Radiology CT HEAD WO CONTRAST (5MM)  Result Date: 05/10/2021 CLINICAL DATA:  Head trauma.  Fall. EXAM: CT HEAD WITHOUT CONTRAST TECHNIQUE: Contiguous axial images were obtained from the base of the skull through the vertex without intravenous contrast. COMPARISON:  March 11, 2020 FINDINGS: Brain: No subdural, epidural, or subarachnoid hemorrhage. White matter changes are stable. No acute cortical ischemia or infarct. No mass effect or midline shift. Cerebellum, brainstem, and basal cisterns are normal. Vascular: Calcified atherosclerosis in the  intracranial carotids. Skull: Normal. Negative for fracture or focal lesion. Sinuses/Orbits: Opacification of scattered ethmoid air cells. Mucosal thickening in the left  maxillary sinus. Fluid in the right maxillary sinus. Other: None. IMPRESSION: 1. No acute intracranial abnormalities. 2. Sinus disease as above. Electronically Signed   By: Dorise Bullion III M.D.   On: 05/10/2021 13:36    Procedures Procedures   Medications Ordered in ED Medications  sodium chloride 0.9 % bolus 1,000 mL (0 mLs Intravenous Stopped 05/10/21 1517)    ED Course  I have reviewed the triage vital signs and the nursing notes.  Pertinent labs & imaging results that were available during my care of the patient were reviewed by me and considered in my medical decision making (see chart for details).    MDM Rules/Calculators/A&P                           87 year old female comes in with chief complaint of fall.  [facility]  She reports that she fell at 7 PM last night and was found around 11:00 in the morning by the staff.  The fall was purely mechanical and not a syncopal episode.  After the fall, she has been having some discomfort over the abdominal region.  On exam she had some diffuse soreness but no focal tenderness and she is able to move her lower extremities.  I do not think an x-ray is indicated at this time  Given that she is fall on blood thinner, CT head along with basic labs have been ordered.  4:06 PM Patient's work-up is negative.  She does not want anything for pain right now.  She will be ambulated, which if she is successful -she is stable for discharge.  Final Clinical Impression(s) / ED Diagnoses Final diagnoses:  Fall, initial encounter  Multiple contusions    Rx / DC Orders ED Discharge Orders     None        Varney Biles, MD 05/10/21 1606

## 2021-05-10 NOTE — ED Notes (Signed)
Pt requested to have lower legs wrapped with gauze d/t weeping. She states she will see her wound care nurse tomorrow. She just doesn't want to ruin her clothes. I wrapped pt legs bilaterally.

## 2021-05-11 NOTE — ED Notes (Signed)
Secretary verified pt for [employer]. Pt has to be added back. Pt will not be transported until 800.

## 2021-05-27 ENCOUNTER — Ambulatory Visit: Payer: Medicare Other | Admitting: Podiatry

## 2021-06-25 ENCOUNTER — Other Ambulatory Visit: Payer: Self-pay

## 2021-06-25 ENCOUNTER — Encounter (HOSPITAL_BASED_OUTPATIENT_CLINIC_OR_DEPARTMENT_OTHER): Payer: Medicare Other | Attending: Internal Medicine | Admitting: Internal Medicine

## 2021-06-25 DIAGNOSIS — I890 Lymphedema, not elsewhere classified: Secondary | ICD-10-CM | POA: Diagnosis not present

## 2021-06-25 DIAGNOSIS — L97821 Non-pressure chronic ulcer of other part of left lower leg limited to breakdown of skin: Secondary | ICD-10-CM | POA: Diagnosis not present

## 2021-06-25 DIAGNOSIS — G4730 Sleep apnea, unspecified: Secondary | ICD-10-CM | POA: Insufficient documentation

## 2021-06-25 DIAGNOSIS — L89322 Pressure ulcer of left buttock, stage 2: Secondary | ICD-10-CM | POA: Insufficient documentation

## 2021-06-25 DIAGNOSIS — I87333 Chronic venous hypertension (idiopathic) with ulcer and inflammation of bilateral lower extremity: Secondary | ICD-10-CM | POA: Diagnosis not present

## 2021-06-25 DIAGNOSIS — Z96659 Presence of unspecified artificial knee joint: Secondary | ICD-10-CM | POA: Insufficient documentation

## 2021-06-25 DIAGNOSIS — Z86711 Personal history of pulmonary embolism: Secondary | ICD-10-CM | POA: Diagnosis not present

## 2021-06-25 DIAGNOSIS — L97811 Non-pressure chronic ulcer of other part of right lower leg limited to breakdown of skin: Secondary | ICD-10-CM | POA: Diagnosis not present

## 2021-06-25 NOTE — Progress Notes (Signed)
Katrina, [name] ([id number]) Visit Report for 06/25/2021 Allergy List Details Patient Name: Date of Service: Katrina [name], Katrina D. 06/25/2021 9:00 A M Medical Record Number: [id number] Patient Account Number: [id number] Date of Birth/Sex: Treating RN: September 15, 1931 (87 y.o. Katrina [name] Primary Care Brent Noto: [name] Other Clinician: Referring Aidric Endicott: Treating Myosha Cuadras/Extender: [name] in Treatment: 0 Allergies Active Allergies penicillin Cephalosporins codeine Allergy Notes Electronic Signature(s) Signed: 06/25/2021 5:51:35 PM By: Levan Hurst RN, BSN Entered By: Levan Hurst on 06/25/2021 09:09:00 -------------------------------------------------------------------------------- Arrival Information Details Patient Name: Date of Service: Katrina Amel D. 06/25/2021 9:00 A M Medical Record Number: [id number] Patient Account Number: [id number] Date of Birth/Sex: Treating RN: 11/11/1931 (87 y.o. Benjamine Sprague, Briant Cedar Primary Care Juventino Pavone: [name] Other Clinician: Referring Daisey Caloca: Treating Devonta Blanford/Extender: [name] in Treatment: 0 Visit Information Patient Arrived: Lyndel Pleasure Time: 09:00 Accompanied By: alone Transfer Assistance: None Patient Identification Verified: Yes Secondary Verification Process Completed: Yes Patient Requires Transmission-Based Precautions: No Patient Has Alerts: Yes Patient Alerts: Patient on Blood Thinner History Since Last Visit Added or deleted any medications: No Any new allergies or adverse reactions: No Had a fall or experienced change in activities of daily living that may affect risk of falls: No Signs or symptoms of abuse/neglect since last visito No Hospitalized since last visit: No Implantable device outside of the clinic excluding cellular tissue based products placed in the center since last visit: No Electronic Signature(s) Signed: 06/25/2021 5:51:35 PM By:  Levan Hurst RN, BSN Entered By: Levan Hurst on 06/25/2021 09:46:59 -------------------------------------------------------------------------------- Clinic Level of Care Assessment Details Patient Name: Date of Service: Katrina Amel D. 06/25/2021 9:00 A M Medical Record Number: [id number] Patient Account Number: [id number] Date of Birth/Sex: Treating RN: 05/20/1932 (87 y.o. Katrina [name] Primary Care Raphel Stickles: [name] Other Clinician: Referring Nakiyah Beverley: Treating Chief Walkup/Extender: [name] in Treatment: 0 Clinic Level of Care Assessment Items TOOL 1 Quantity Score X- 1 0 Use when EandM and Procedure is performed on INITIAL visit ASSESSMENTS - Nursing Assessment / Reassessment X- 1 20 General Physical Exam (combine w/ comprehensive assessment (listed just below) when performed on new pt. evals) X- 1 25 Comprehensive Assessment (HX, ROS, Risk Assessments, Wounds Hx, etc.) ASSESSMENTS - Wound and Skin Assessment / Reassessment []  - 0 Dermatologic / Skin Assessment (not related to wound area) ASSESSMENTS - Ostomy and/or Continence Assessment and Care []  - 0 Incontinence Assessment and Management []  - 0 Ostomy Care Assessment and Management (repouching, etc.) PROCESS - Coordination of Care X - Simple Patient / Family Education for ongoing care 1 15 []  - 0 Complex (extensive) Patient / Family Education for ongoing care X- 1 10 Staff obtains Programmer, systems, Records, T Results / Process Orders est X- 1 10 Staff telephones HHA, Nursing Homes / Clarify orders / etc []  - 0 Routine Transfer to another Facility (non-emergent condition) []  - 0 Routine Hospital Admission (non-emergent condition) X- 1 15 New Admissions / Biomedical engineer / Ordering NPWT Apligraf, etc. , []  - 0 Emergency Hospital Admission (emergent condition) PROCESS - Special Needs []  - 0 Pediatric / Minor Patient Management []  - 0 Isolation Patient Management []  -  0 Hearing / Language / Visual special needs []  - 0 Assessment of Community assistance (transportation, D/C planning, etc.) []  - 0 Additional assistance / Altered mentation []  - 0 Support Surface(s) Assessment (bed, cushion, seat, etc.) INTERVENTIONS - Miscellaneous []  - 0 External ear exam []  - 0 Patient Transfer (multiple staff /  Hoyer Lift / Similar devices) []  - 0 Simple Staple / Suture removal (25 or less) []  - 0 Complex Staple / Suture removal (26 or more) []  - 0 Hypo/Hyperglycemic Management (do not check if billed separately) X- 1 15 Ankle / Brachial Index (ABI) - do not check if billed separately Has the patient been seen at the hospital within the last three years: Yes Total Score: 110 Level Of Care: New/Established - Level 3 Electronic Signature(s) Signed: 06/25/2021 5:51:35 PM By: Levan Hurst RN, BSN Signed: 06/25/2021 5:51:35 PM By: Levan Hurst RN, BSN Entered By: Levan Hurst on 06/25/2021 17:24:08 -------------------------------------------------------------------------------- Compression Therapy Details Patient Name: Date of Service: Katrina Amel D. 06/25/2021 9:00 A M Medical Record Number: [id number] Patient Account Number: [id number] Date of Birth/Sex: Treating RN: 07/18/1932 (87 y.o. Katrina [name] Primary Care Annalise Mcdiarmid: [name] Other Clinician: Referring Migdalia Olejniczak: Treating Jahmier Willadsen/Extender: [name] in Treatment: 0 Compression Therapy Performed for Wound Assessment: Wound #10 Left,Lateral Lower Leg Performed By: Clinician Levan Hurst, RN Compression Type: Three Layer Post Procedure Diagnosis Same as Pre-procedure Electronic Signature(s) Signed: 06/25/2021 5:51:35 PM By: Levan Hurst RN, BSN Entered By: Levan Hurst on 06/25/2021 09:56:41 -------------------------------------------------------------------------------- Compression Therapy Details Patient Name: Date of Service: Katrina Amel D.  06/25/2021 9:00 A M Medical Record Number: [id number] Patient Account Number: [id number] Date of Birth/Sex: Treating RN: 04/08/1932 (87 y.o. Katrina [name] Primary Care [name] Korenek: [name] Other Clinician: Referring Derrica Sieg: Treating Dezhane Staten/Extender: [name] in Treatment: 0 Compression Therapy Performed for Wound Assessment: Wound #11 Right,Anterior Lower Leg Performed By: Clinician Levan Hurst, RN Compression Type: Three Layer Post Procedure Diagnosis Same as Pre-procedure Electronic Signature(s) Signed: 06/25/2021 5:51:35 PM By: Levan Hurst RN, BSN Entered By: Levan Hurst on 06/25/2021 09:56:42 -------------------------------------------------------------------------------- Compression Therapy Details Patient Name: Date of Service: Katrina Amel D. 06/25/2021 9:00 A M Medical Record Number: [id number] Patient Account Number: [id number] Date of Birth/Sex: Treating RN: Jun 14, 1932 (87 y.o. Katrina [name] Primary Care Jalesa Thien: [name] Other Clinician: Referring Maricela Kawahara: Treating Sung Parodi/Extender: [name] in Treatment: 0 Compression Therapy Performed for Wound Assessment: Wound #12 Right,Lateral Lower Leg Performed By: Clinician Levan Hurst, RN Compression Type: Three Layer Post Procedure Diagnosis Same as Pre-procedure Electronic Signature(s) Signed: 06/25/2021 5:51:35 PM By: Levan Hurst RN, BSN Entered By: Levan Hurst on 06/25/2021 09:56:42 -------------------------------------------------------------------------------- Encounter Discharge Information Details Patient Name: Date of Service: Katrina Amel D. 06/25/2021 9:00 A M Medical Record Number: [id number] Patient Account Number: [id number] Date of Birth/Sex: Treating RN: Jun 22, 1932 (87 y.o. Katrina [name] Primary Care Lanessa Shill: [name] Other Clinician: Referring Lynne Takemoto: Treating Yeiden Frenkel/Extender: [name] in Treatment: 0 Encounter Discharge Information Items Discharge Condition: Stable Ambulatory Status: Walker Discharge Destination: Home Transportation: Private Auto Accompanied By: alone Schedule Follow-up Appointment: Yes Clinical Summary of Care: Patient Declined Electronic Signature(s) Signed: 06/25/2021 5:51:35 PM By: Levan Hurst RN, BSN Entered By: Levan Hurst on 06/25/2021 17:28:25 -------------------------------------------------------------------------------- Lower Extremity Assessment Details Patient Name: Date of Service: Katrina Amel D. 06/25/2021 9:00 A M Medical Record Number: [id number] Patient Account Number: [id number] Date of Birth/Sex: Treating RN: 1932-05-24 (87 y.o. Katrina [name] Primary Care Shya Kovatch: [name] Other Clinician: Referring Ardon Franklin: Treating Barbera Perritt/Extender: [name] in Treatment: 0 Edema Assessment Assessed: Shirlyn Goltz: No] Patrice Paradise: No] E[Left: dema] [Right: :] Calf Left: Right: Point of Measurement: 29 cm From Medial Instep 50 cm 47 cm Ankle Left: Right: Point of Measurement: 11 cm From  Medial Instep 30 cm 28.5 cm Vascular Assessment Pulses: Dorsalis Pedis Palpable: [Left:Yes] [Right:Yes] Blood Pressure: Brachial: [Left:158] Ankle: [Left:Dorsalis Pedis: 152 0.96] Electronic Signature(s) Signed: 06/25/2021 5:51:35 PM By: Levan Hurst RN, BSN Entered By: Levan Hurst on 06/25/2021 09:31:34 -------------------------------------------------------------------------------- Multi Wound Chart Details Patient Name: Date of Service: Katrina Amel D. 06/25/2021 9:00 A M Medical Record Number: [id number] Patient Account Number: [id number] Date of Birth/Sex: Treating RN: Dec 21, 1931 (87 y.o. [name], Meta.Reding Primary Care Myesha Stillion: [name] Other Clinician: Referring Eily Louvier: Treating Heath Tesler/Extender: [name] in Treatment:  0 Vital Signs Height(in): 61 Pulse(bpm): 81 Weight(lbs): 221 Blood Pressure(mmHg): 158/75 Body Mass Index(BMI): 42 Temperature(F): 97.9 Respiratory Rate(breaths/min): 18 Photos: [10:No Photos Left, Lateral Lower Leg] [11:No Photos Right, Anterior Lower Leg] [12:No Photos Right, Lateral Lower Leg] Wound Location: [10:Gradually Appeared] [11:Gradually Appeared] [12:Gradually Appeared] Wounding Event: [10:Venous Leg Ulcer] [11:Venous Leg Ulcer] [12:Venous Leg Ulcer] Primary Etiology: [10:10/21/2020] [11:10/21/2020] [12:10/21/2020] Date Acquired: [10:0] [11:0] [12:0] Weeks of Treatment: [10:Open] [11:Open] [12:Open] Wound Status: [10:No] [11:No] [12:No] Clustered Wound: [10:N/A] [11:N/A] [12:N/A] Clustered Quantity: [10:7x7x0.1] [11:3.8x3x0.1] [12:11.5x9x0.1] Measurements L x W x D (cm) [10:38.485] [11:8.954] [12:81.289] A (cm) : rea [10:3.848] [11:0.895] [12:8.129] Volume (cm) : [10:Full Thickness Without Exposed] [11:Full Thickness Without Exposed] [12:Full Thickness Without Exposed] Classification: [10:Support Structures Medium] [11:Support Structures Medium] [12:Support Structures Large] Exudate Amount: [10:Serous] [11:Serous] [12:Serous] Exudate Type: [10:amber] [11:amber] [12:amber] Exudate Color: [10:Flat and Intact] [11:Flat and Intact] [12:Flat and Intact] Wound Margin: [10:Large (67-100%)] [11:Large (67-100%)] [12:Large (67-100%)] Granulation Amount: [10:Pink] [11:Pink] [12:Pink] Granulation Quality: [10:None Present (0%)] [11:None Present (0%)] [12:None Present (0%)] Necrotic Amount: [10:Fat Layer (Subcutaneous Tissue): Yes Fat Layer (Subcutaneous Tissue): Yes Fat Layer (Subcutaneous Tissue): Yes] Exposed Structures: [10:Fascia: No Tendon: No Muscle: No Joint: No Bone: No None] [11:Fascia: No Tendon: No Muscle: No Joint: No Bone: No None] [12:Fascia: No Tendon: No Muscle: No Joint: No Bone: No None] Epithelialization: [10:Compression Therapy] [11:Compression Therapy]  [12:Compression Therapy] Wound Number: 13 N/A N/A Photos: No Photos N/A N/A Left Gluteus N/A N/A Wound Location: Gradually Appeared N/A N/A Wounding Event: Pressure Ulcer N/A N/A Primary Etiology: 10/21/2020 N/A N/A Date Acquired: 0 N/A N/A Weeks of Treatment: Open N/A N/A Wound Status: Yes N/A N/A Clustered Wound: 2 N/A N/A Clustered Quantity: 1x0.5x0.1 N/A N/A Measurements L x W x D (cm) 0.393 N/A N/A A (cm) : rea 0.039 N/A N/A Volume (cm) : Category/Stage II N/A N/A Classification: Medium N/A N/A Exudate A mount: Serosanguineous N/A N/A Exudate Type: red, brown N/A N/A Exudate Color: Flat and Intact N/A N/A Wound Margin: Large (67-100%) N/A N/A Granulation A mount: Red N/A N/A Granulation Quality: None Present (0%) N/A N/A Necrotic A mount: Fat Layer (Subcutaneous Tissue): Yes N/A N/A Exposed Structures: Fascia: No Tendon: No Muscle: No Joint: No Bone: No None N/A N/A Epithelialization: N/A N/A N/A Procedures Performed: Treatment Notes Electronic Signature(s) Signed: 06/25/2021 5:02:06 PM By: Linton Ham MD Signed: 06/25/2021 6:11:26 PM By: Deon Pilling RN, BSN Entered By: Linton Ham on 06/25/2021 10:27:21 -------------------------------------------------------------------------------- Multi-Disciplinary Care Plan Details Patient Name: Date of Service: Katrina Amel D. 06/25/2021 9:00 A M Medical Record Number: [id number] Patient Account Number: [id number] Date of Birth/Sex: Treating RN: 07-Dec-1931 (87 y.o. Katrina [name] Primary Care Anyi Fels: [name] Other Clinician: Referring Prescious Hurless: Treating Teion Ballin/Extender: [name] in Treatment: 0 Multidisciplinary Care Plan reviewed with physician Active Inactive Abuse / Safety / Falls / Self Care Management Nursing Diagnoses: Potential for falls Potential for injury related to falls  Goals: Patient will remain injury free related to falls Date  Initiated: 06/25/2021 Target Resolution Date: 07/24/2021 Goal Status: Active Patient/caregiver will verbalize/demonstrate measures taken to prevent injury and/or falls Date Initiated: 06/25/2021 Target Resolution Date: 07/24/2021 Goal Status: Active Interventions: Assess Activities of Daily Living upon admission and as needed Assess fall risk on admission and as needed Assess: immobility, friction, shearing, incontinence upon admission and as needed Assess impairment of mobility on admission and as needed per policy Assess personal safety and home safety (as indicated) on admission and as needed Assess self care needs on admission and as needed Provide education on fall prevention Provide education on personal and home safety Notes: Wound/Skin Impairment Nursing Diagnoses: Impaired tissue integrity Knowledge deficit related to ulceration/compromised skin integrity Goals: Patient/caregiver will verbalize understanding of skin care regimen Date Initiated: 06/25/2021 Target Resolution Date: 07/24/2021 Goal Status: Active Interventions: Assess patient/caregiver ability to obtain necessary supplies Assess patient/caregiver ability to perform ulcer/skin care regimen upon admission and as needed Assess ulceration(s) every visit Provide education on ulcer and skin care Notes: Electronic Signature(s) Signed: 06/25/2021 5:51:35 PM By: Levan Hurst RN, BSN Entered By: Levan Hurst on 06/25/2021 17:23:31 -------------------------------------------------------------------------------- Pain Assessment Details Patient Name: Date of Service: Katrina Amel D. 06/25/2021 9:00 A M Medical Record Number: [id number] Patient Account Number: [id number] Date of Birth/Sex: Treating RN: 1931-11-29 (87 y.o. Katrina [name] Primary Care Azuri Bozard: [name] Other Clinician: Referring Matteo Banke: Treating Autum Benfer/Extender: [name] in Treatment: 0 Active  Problems Location of Pain Severity and Description of Pain Patient Has Paino No Site Locations Pain Management and Medication Current Pain Management: Electronic Signature(s) Signed: 06/25/2021 5:51:35 PM By: Levan Hurst RN, BSN Entered By: Levan Hurst on 06/25/2021 09:36:20 -------------------------------------------------------------------------------- Patient/Caregiver Education Details Patient Name: Date of Service: Katrina Amel D. 11/3/2022andnbsp9:00 A M Medical Record Number: [id number] Patient Account Number: [id number] Date of Birth/Gender: Treating RN: Jun 07, 1932 (87 y.o. Katrina [name] Primary Care Physician: [name] Other Clinician: Referring Physician: Treating Physician/Extender: [name] in Treatment: 0 Education Assessment Education Provided To: Patient Education Topics Provided Wound/Skin Impairment: Methods: Explain/Verbal Responses: State content correctly Electronic Signature(s) Signed: 06/25/2021 5:51:35 PM By: Levan Hurst RN, BSN Entered By: Levan Hurst on 06/25/2021 17:23:40 -------------------------------------------------------------------------------- Wound Assessment Details Patient Name: Date of Service: Katrina Amel D. 06/25/2021 9:00 A M Medical Record Number: [id number] Patient Account Number: [id number] Date of Birth/Sex: Treating RN: 1932-01-10 (87 y.o. Katrina [name] Primary Care Laronn Devonshire: [name] Other Clinician: Referring Hosteen Kienast: Treating Wynn Kernes/Extender: [name] in Treatment: 0 Wound Status Wound Number: 10 Primary Etiology: Venous Leg Ulcer Wound Location: Left, Lateral Lower Leg Wound Status: Open Wounding Event: Gradually Appeared Date Acquired: 10/21/2020 Weeks Of Treatment: 0 Clustered Wound: No Wound Measurements Length: (cm) 7 Width: (cm) 7 Depth: (cm) 0.1 Area: (cm) 38.485 Volume: (cm) 3.848 % Reduction in Area: %  Reduction in Volume: Epithelialization: None Tunneling: No Undermining: No Wound Description Classification: Full Thickness Without Exposed Support Structures Wound Margin: Flat and Intact Exudate Amount: Medium Exudate Type: Serous Exudate Color: amber Foul Odor After Cleansing: No Slough/Fibrino No Wound Bed Granulation Amount: Large (67-100%) Exposed Structure Granulation Quality: Pink Fascia Exposed: No Necrotic Amount: None Present (0%) Fat Layer (Subcutaneous Tissue) Exposed: Yes Tendon Exposed: No Muscle Exposed: No Joint Exposed: No Bone Exposed: No Treatment Notes Wound #10 (Lower Leg) Wound Laterality: Left, Lateral Cleanser Soap and Water Discharge Instruction: May shower and wash wound with dial antibacterial soap and water prior  to dressing change. Wound Cleanser Discharge Instruction: Cleanse the wound with wound cleanser prior to applying a clean dressing using gauze sponges, not tissue or cotton balls. Peri-Wound Care Triamcinolone 15 (g) Discharge Instruction: Use triamcinolone 15 (g) as directed Sween Lotion (Moisturizing lotion) Discharge Instruction: Apply moisturizing lotion as directed Topical Primary Dressing KerraCel Ag Gelling Fiber Dressing, 4x5 in (silver alginate) Discharge Instruction: Apply silver alginate to wound bed as instructed Secondary Dressing Woven Gauze Sponge, Non-Sterile 4x4 in Discharge Instruction: Apply over primary dressing as directed. ABD Pad, 8x10 Discharge Instruction: Apply over primary dressing as directed. Secured With Compression Wrap ThreePress (3 layer compression wrap) Discharge Instruction: Apply three layer compression as directed. Compression Stockings Add-Ons Electronic Signature(s) Signed: 06/25/2021 5:51:35 PM By: Levan Hurst RN, BSN Entered By: Levan Hurst on 06/25/2021 09:32:51 -------------------------------------------------------------------------------- Wound Assessment Details Patient  Name: Date of Service: Katrina Amel D. 06/25/2021 9:00 A M Medical Record Number: [id number] Patient Account Number: [id number] Date of Birth/Sex: Treating RN: [birth year] (87 y.o. Katrina [name] Primary Care Carlos Heber: [name] Other Clinician: Referring Johntavius Shepard: Treating Jalysa Swopes/Extender: [name] in Treatment: 0 Wound Status Wound Number: 11 Primary Etiology: Venous Leg Ulcer Wound Location: Right, Anterior Lower Leg Wound Status: Open Wounding Event: Gradually Appeared Date Acquired: 10/21/2020 Weeks Of Treatment: 0 Clustered Wound: No Wound Measurements Length: (cm) 3.8 Width: (cm) 3 Depth: (cm) 0.1 Area: (cm) 8.954 Volume: (cm) 0.895 % Reduction in Area: % Reduction in Volume: Epithelialization: None Tunneling: No Undermining: No Wound Description Classification: Full Thickness Without Exposed Support Structures Wound Margin: Flat and Intact Exudate Amount: Medium Exudate Type: Serous Exudate Color: amber Foul Odor After Cleansing: No Slough/Fibrino No Wound Bed Granulation Amount: Large (67-100%) Exposed Structure Granulation Quality: Pink Fascia Exposed: No Necrotic Amount: None Present (0%) Fat Layer (Subcutaneous Tissue) Exposed: Yes Tendon Exposed: No Muscle Exposed: No Joint Exposed: No Bone Exposed: No Treatment Notes Wound #11 (Lower Leg) Wound Laterality: Right, Anterior Cleanser Soap and Water Discharge Instruction: May shower and wash wound with dial antibacterial soap and water prior to dressing change. Wound Cleanser Discharge Instruction: Cleanse the wound with wound cleanser prior to applying a clean dressing using gauze sponges, not tissue or cotton balls. Peri-Wound Care Triamcinolone 15 (g) Discharge Instruction: Use triamcinolone 15 (g) as directed Sween Lotion (Moisturizing lotion) Discharge Instruction: Apply moisturizing lotion as directed Topical Primary Dressing KerraCel Ag Gelling  Fiber Dressing, 4x5 in (silver alginate) Discharge Instruction: Apply silver alginate to wound bed as instructed Secondary Dressing Woven Gauze Sponge, Non-Sterile 4x4 in Discharge Instruction: Apply over primary dressing as directed. ABD Pad, 8x10 Discharge Instruction: Apply over primary dressing as directed. Secured With Compression Wrap ThreePress (3 layer compression wrap) Discharge Instruction: Apply three layer compression as directed. Compression Stockings Add-Ons Electronic Signature(s) Signed: 06/25/2021 5:51:35 PM By: Levan Hurst RN, BSN Entered By: Levan Hurst on 06/25/2021 09:34:04 -------------------------------------------------------------------------------- Wound Assessment Details Patient Name: Date of Service: Katrina Amel D. 06/25/2021 9:00 A M Medical Record Number: [id number] Patient Account Number: [id number] Date of Birth/Sex: Treating RN: 02-18-1932 (87 y.o. Katrina [name] Primary Care Ephrata Verville: Other Clinician: [name] Referring Delma Drone: Treating Kolston Lacount/Extender: [name] in Treatment: 0 Wound Status Wound Number: 12 Primary Etiology: Venous Leg Ulcer Wound Location: Right, Lateral Lower Leg Wound Status: Open Wounding Event: Gradually Appeared Date Acquired: 10/21/2020 Weeks Of Treatment: 0 Clustered Wound: No Wound Measurements Length: (cm) 11.5 Width: (cm) 9 Depth: (cm) 0.1 Area: (cm) 81.289 Volume: (cm) 8.129 % Reduction in  Area: % Reduction in Volume: Epithelialization: None Tunneling: No Undermining: No Wound Description Classification: Full Thickness Without Exposed Support Structures Wound Margin: Flat and Intact Exudate Amount: Large Exudate Type: Serous Exudate Color: amber Foul Odor After Cleansing: No Slough/Fibrino No Wound Bed Granulation Amount: Large (67-100%) Exposed Structure Granulation Quality: Pink Fascia Exposed: No Necrotic Amount: None Present (0%) Fat Layer  (Subcutaneous Tissue) Exposed: Yes Tendon Exposed: No Muscle Exposed: No Joint Exposed: No Bone Exposed: No Treatment Notes Wound #12 (Lower Leg) Wound Laterality: Right, Lateral Cleanser Soap and Water Discharge Instruction: May shower and wash wound with dial antibacterial soap and water prior to dressing change. Wound Cleanser Discharge Instruction: Cleanse the wound with wound cleanser prior to applying a clean dressing using gauze sponges, not tissue or cotton balls. Peri-Wound Care Triamcinolone 15 (g) Discharge Instruction: Use triamcinolone 15 (g) as directed Sween Lotion (Moisturizing lotion) Discharge Instruction: Apply moisturizing lotion as directed Topical Primary Dressing KerraCel Ag Gelling Fiber Dressing, 4x5 in (silver alginate) Discharge Instruction: Apply silver alginate to wound bed as instructed Secondary Dressing Woven Gauze Sponge, Non-Sterile 4x4 in Discharge Instruction: Apply over primary dressing as directed. ABD Pad, 8x10 Discharge Instruction: Apply over primary dressing as directed. Secured With Compression Wrap ThreePress (3 layer compression wrap) Discharge Instruction: Apply three layer compression as directed. Compression Stockings Add-Ons Electronic Signature(s) Signed: 06/25/2021 5:51:35 PM By: Levan Hurst RN, BSN Entered By: Levan Hurst on 06/25/2021 09:35:01 -------------------------------------------------------------------------------- Wound Assessment Details Patient Name: Date of Service: Katrina Amel D. 06/25/2021 9:00 A M Medical Record Number: [id number] Patient Account Number: [id number] Date of Birth/Sex: Treating RN: 27-Aug-1931 (87 y.o. Katrina [name] Primary Care Paden Senger: [name] Other Clinician: Referring Lynzi Meulemans: Treating Christmas Faraci/Extender: [name] in Treatment: 0 Wound Status Wound Number: 13 Primary Etiology: Pressure Ulcer Wound Location: Left Gluteus Wound Status:  Open Wounding Event: Gradually Appeared Date Acquired: 10/21/2020 Weeks Of Treatment: 0 Clustered Wound: Yes Wound Measurements Length: (cm) 1 Width: (cm) 0.5 Depth: (cm) 0.1 Clustered Quantity: 2 Area: (cm) 0.393 Volume: (cm) 0.039 % Reduction in Area: % Reduction in Volume: Epithelialization: None Tunneling: No Undermining: No Wound Description Classification: Category/Stage II Wound Margin: Flat and Intact Exudate Amount: Medium Exudate Type: Serosanguineous Exudate Color: red, brown Foul Odor After Cleansing: No Slough/Fibrino No Wound Bed Granulation Amount: Large (67-100%) Exposed Structure Granulation Quality: Red Fascia Exposed: No Necrotic Amount: None Present (0%) Fat Layer (Subcutaneous Tissue) Exposed: Yes Tendon Exposed: No Muscle Exposed: No Joint Exposed: No Bone Exposed: No Treatment Notes Wound #13 (Gluteus) Wound Laterality: Left Cleanser Wound Cleanser Discharge Instruction: Cleanse the wound with wound cleanser prior to applying a clean dressing using gauze sponges, not tissue or cotton balls. Peri-Wound Care Skin Prep Discharge Instruction: Use skin prep as directed Topical Primary Dressing KerraCel Ag Gelling Fiber Dressing, 2x2 in (silver alginate) Discharge Instruction: Apply silver alginate to wound bed as instructed Secondary Dressing Bordered Gauze, 4x4 in Discharge Instruction: Apply over primary dressing as directed. Secured With Compression Wrap Compression Stockings Environmental education officer) Signed: 06/25/2021 5:51:35 PM By: Levan Hurst RN, BSN Entered By: Levan Hurst on 06/25/2021 09:36:00 -------------------------------------------------------------------------------- Vitals Details Patient Name: Date of Service: Katrina Amel D. 06/25/2021 9:00 A M Medical Record Number: [id number] Patient Account Number: [id number] Date of Birth/Sex: Treating RN: 1932/04/20 (87 y.o. Katrina [name] Primary Care Congetta Odriscoll:  [name] Other Clinician: Referring Micaylah Bertucci: Treating Hooria Gasparini/Extender: [name] in Treatment: 0 Vital Signs Time Taken: 09:04 Temperature (F): 97.9 Height (in): 61  Pulse (bpm): 81 Source: Stated Respiratory Rate (breaths/min): 18 Weight (lbs): 221 Blood Pressure (mmHg): 158/75 Source: Stated Reference Range: 80 - 120 mg / dl Body Mass Index (BMI): 41.8 Electronic Signature(s) Signed: 06/25/2021 5:51:35 PM By: Levan Hurst RN, BSN Entered By: Levan Hurst on 06/25/2021 09:04:35

## 2021-06-25 NOTE — Progress Notes (Signed)
[name], COPADO ([id number]) Visit Report for 06/25/2021 HPI Details Patient Name: Date of Service: Katrina [name], Katrina D. 06/25/2021 9:00 A M Medical Record Number: [id number] Patient Account Number: [id number] Date of Birth/Sex: Treating RN: 12-27-1931 (87 y.o. Katrina [name] Primary Care Provider: [name] Other Clinician: Referring Provider: Treating Provider/Extender: [name] in Treatment: 0 History of Present Illness Location: right lower extremity Quality: Patient reports experiencing a dull pain to affected area(s). Severity: Patient states wound(s) are getting better. Duration: Patient has had the wound for 3 months prior to presenting for treatment Context: The wound would happen gradually Modifying Factors: Patient wound(s)/ulcer(s) are worsening due to :standing for a long while. HPI Description: Past medical history of pulmonary emboli, hypothyroidism, hyperlipidemia, hypertension, sleep apnea, gallstones, ulcerated lower extremities, atrial fibrillation, status post total knee replacement, cholecystectomy, cystoscopy and stent placement for kidney stones. She has never been a smoker. About a year ago she was seen by [name] with an ulcer of her right lower extremity which she's had for about over 3 months. On 10/23/14 -- Lower extremity venous duplex evaluation. She had no evidence of deep venous thromboses involving both lower extremities. There was evidence of deep vein reflux in the right and left common femoral veins. She also had evidence of great saphenous vein reflux noted only at the level of the saphenofemoral junction. No evidence of greater or small saphenous vein reflux. She will need a consult to the vascular surgeon. Seen by Dr. Mathis Fare office note dated 11/13/2014. He noted that she had good arterial blood flow with palpable DP pulses bilaterally. Based on the patient's history Dr. Scot Dock recommended elevation when at rest of both feet and  knees and ankles above heart level in the supine position. Activity such as walking and pool exercises were recommended. She will also have 15-20 mm thigh-high compression stockings daily once the ulcers have completely healed. A prescription was given to her. 11/04/2015 -- the wound on her right lower extremity has completely healed and her edema has gone down significantly. However in her popliteal fossa where she's got a lot of fungal infection there is an area which is now an open wound and this will need attention. 12/02/2015 -- the right lower extremity looks much better than the edema has gone down significantly however she continues to have significant edema on the left side today. 12/09/2015 -- the patient has been doing her dressings as advised and says that overall she feels much better. She has received her juxta light for the left lower extremity READMISSION 06/25/2021 This is a patient who is actually had several previous visits to our clinic in 2014, 2016 and more recently 2017. This was largely because of wounds on her lower legs in the setting of chronic venous insufficiency and secondary lymphedema. Looking at my records she was apparently prescribed thigh-high compression stockings probably by vein and vascular although she comes back in not having worn any stockings in quite a period of time. She is now in [facility] She thinks her wounds have been there since April. This includes the left lateral, right anterior and right posterior lateral parts of both lower legs. She also has a stage II pressure ulcer on her left glued I think which is happened because of sleeping in a lift chair. She had ABDs and kerlix on her bilateral lower legs. Past medical history reviewed she has hypothyroidism, left buttock pressure ulcer dating back to  March of this year, iron deficiency, falls, paroxysmal atrial fibrillation anticoagulated  and chronic venous insufficiency ABIs in our clinic were 0.86 on the right and 0.96 on the left Electronic Signature(s) Signed: 06/25/2021 5:02:06 PM By: Linton Ham MD Entered By: Linton Ham on 06/25/2021 10:29:43 -------------------------------------------------------------------------------- Physical Exam Details Patient Name: Date of Service: Katrina Amel D. 06/25/2021 9:00 A M Medical Record Number: [id number] Patient Account Number: [id number] Date of Birth/Sex: Treating RN: 07-24-1932 (87 y.o. Katrina [name] Primary Care Provider: [name] Other Clinician: Referring Provider: Treating Provider/Extender: [name] in Treatment: 0 Constitutional Patient is hypertensive.. Pulse regular and within target range for patient.Marland Kitchen Respirations regular, non-labored and within target range.. Temperature is normal and within the target range for the patient.Marland Kitchen Appears in no distress. Cardiovascular Pedal pulses palpable bilaterally. She has stage III lymphedema bilaterally with lipodermatosclerosis more distally towards the ankles. Very dry flaking inflamed skin with denuded epithelium.. Integumentary (Hair, Skin) Severe stasis dermatitis in the bilateral lower legs with lipodermatosclerosis in the distal one third of her legs with uncontrolled lymphedema above this.. Notes Wound exam On her legs she has areas on the left anterior and left lateral. These are superficial wounds. On the right lateral and posterior all of these superficial areas limited to breakdown of skin. She has severe bilateral uncontrolled chronic venous hypertension, inflammation with secondary lymphedema On her left buttock she has 2 small open areas medially close to the gluteal cleft. This looks like a "pinched" area from sitting and sleeping too long in a chair. I think we should do well here if she is able to get the pressure off these areas although talking to her I am still  concerned. It starts with sleeping in a bed and apparently she says she is already started this Electronic Signature(s) Signed: 06/25/2021 5:02:06 PM By: Linton Ham MD Entered By: Linton Ham on 06/25/2021 10:32:33 -------------------------------------------------------------------------------- Physician Orders Details Patient Name: Date of Service: Katrina Amel D. 06/25/2021 9:00 A M Medical Record Number: [id number] Patient Account Number: [id number] Date of Birth/Sex: Treating RN: 09-19-1931 (87 y.o. [name] Primary Care Provider: [name] Other Clinician: Referring Provider: Treating Provider/Extender: [name] in Treatment: 0 Verbal / Phone Orders: No Diagnosis Coding Follow-up Appointments ppointment in 1 week. - with Dr. Dellia Nims Return A Bathing/ Shower/ Hygiene May shower with protection but do not get wound dressing(s) wet. - Ok to use cast protector Edema Control - Lymphedema / SCD / Other Elevate legs to the level of the heart or above for 30 minutes daily and/or when sitting, a frequency of: - throughout the day Avoid standing for long periods of time. Exercise regularly Moisturize legs daily. Wound Treatment Wound #10 - Lower Leg Wound Laterality: Left, Lateral Cleanser: Soap and Water 3 x Per Week/7 Days Discharge Instructions: May shower and wash wound with dial antibacterial soap and water prior to dressing change. Cleanser: Wound Cleanser 3 x Per Week/7 Days Discharge Instructions: Cleanse the wound with wound cleanser prior to applying a clean dressing using gauze sponges, not tissue or cotton balls. Peri-Wound Care: Triamcinolone 15 (g) 3 x Per Week/7 Days Discharge Instructions: Use triamcinolone 15 (g) as directed Peri-Wound Care: Sween Lotion (Moisturizing lotion) 3 x Per Week/7 Days Discharge Instructions: Apply moisturizing lotion as directed Prim Dressing: KerraCel Ag Gelling Fiber Dressing, 4x5 in  (silver alginate) 3 x Per Week/7 Days ary Discharge Instructions: Apply silver alginate to wound bed as instructed Secondary Dressing: Woven Gauze  Sponge, Non-Sterile 4x4 in 3 x Per Week/7 Days Discharge Instructions: Apply over primary dressing as directed. Secondary Dressing: ABD Pad, 8x10 3 x Per Week/7 Days Discharge Instructions: Apply over primary dressing as directed. Compression Wrap: ThreePress (3 layer compression wrap) 3 x Per Week/7 Days Discharge Instructions: Apply three layer compression as directed. Wound #11 - Lower Leg Wound Laterality: Right, Anterior Cleanser: Soap and Water 3 x Per Week/7 Days Discharge Instructions: May shower and wash wound with dial antibacterial soap and water prior to dressing change. Cleanser: Wound Cleanser 3 x Per Week/7 Days Discharge Instructions: Cleanse the wound with wound cleanser prior to applying a clean dressing using gauze sponges, not tissue or cotton balls. Peri-Wound Care: Triamcinolone 15 (g) 3 x Per Week/7 Days Discharge Instructions: Use triamcinolone 15 (g) as directed Peri-Wound Care: Sween Lotion (Moisturizing lotion) 3 x Per Week/7 Days Discharge Instructions: Apply moisturizing lotion as directed Prim Dressing: KerraCel Ag Gelling Fiber Dressing, 4x5 in (silver alginate) 3 x Per Week/7 Days ary Discharge Instructions: Apply silver alginate to wound bed as instructed Secondary Dressing: Woven Gauze Sponge, Non-Sterile 4x4 in 3 x Per Week/7 Days Discharge Instructions: Apply over primary dressing as directed. Secondary Dressing: ABD Pad, 8x10 3 x Per Week/7 Days Discharge Instructions: Apply over primary dressing as directed. Compression Wrap: ThreePress (3 layer compression wrap) 3 x Per Week/7 Days Discharge Instructions: Apply three layer compression as directed. Wound #12 - Lower Leg Wound Laterality: Right, Lateral Cleanser: Soap and Water 3 x Per Week/7 Days Discharge Instructions: May shower and wash wound with dial  antibacterial soap and water prior to dressing change. Cleanser: Wound Cleanser 3 x Per Week/7 Days Discharge Instructions: Cleanse the wound with wound cleanser prior to applying a clean dressing using gauze sponges, not tissue or cotton balls. Peri-Wound Care: Triamcinolone 15 (g) 3 x Per Week/7 Days Discharge Instructions: Use triamcinolone 15 (g) as directed Peri-Wound Care: Sween Lotion (Moisturizing lotion) 3 x Per Week/7 Days Discharge Instructions: Apply moisturizing lotion as directed Prim Dressing: KerraCel Ag Gelling Fiber Dressing, 4x5 in (silver alginate) 3 x Per Week/7 Days ary Discharge Instructions: Apply silver alginate to wound bed as instructed Secondary Dressing: Woven Gauze Sponge, Non-Sterile 4x4 in 3 x Per Week/7 Days Discharge Instructions: Apply over primary dressing as directed. Secondary Dressing: ABD Pad, 8x10 3 x Per Week/7 Days Discharge Instructions: Apply over primary dressing as directed. Compression Wrap: ThreePress (3 layer compression wrap) 3 x Per Week/7 Days Discharge Instructions: Apply three layer compression as directed. Wound #13 - Gluteus Wound Laterality: Left Cleanser: Wound Cleanser 3 x Per Week/7 Days Discharge Instructions: Cleanse the wound with wound cleanser prior to applying a clean dressing using gauze sponges, not tissue or cotton balls. Peri-Wound Care: Skin Prep 3 x Per Week/7 Days Discharge Instructions: Use skin prep as directed Prim Dressing: KerraCel Ag Gelling Fiber Dressing, 2x2 in (silver alginate) 3 x Per Week/7 Days ary Discharge Instructions: Apply silver alginate to wound bed as instructed Secondary Dressing: Bordered Gauze, 4x4 in 3 x Per Week/7 Days Discharge Instructions: Apply over primary dressing as directed. Electronic Signature(s) Signed: 06/25/2021 5:02:06 PM By: Linton Ham MD Signed: 06/25/2021 5:51:35 PM By: Levan Hurst RN, BSN Entered By: Levan Hurst on 06/25/2021  09:57:43 -------------------------------------------------------------------------------- Problem List Details Patient Name: Date of Service: Katrina Amel D. 06/25/2021 9:00 A M Medical Record Number: [id number] Patient Account Number: [id number] Date of Birth/Sex: Treating RN: Dec 06, 1931 (87 y.o. Katrina [name] Primary Care Provider: [name] Other Clinician: Referring  Provider: Treating Provider/Extender: [name] in Treatment: 0 Active Problems ICD-10 Encounter Code Description Active Date MDM Diagnosis I87.333 Chronic venous hypertension (idiopathic) with ulcer and inflammation of 06/25/2021 No Yes bilateral lower extremity I89.0 Lymphedema, not elsewhere classified 06/25/2021 No Yes L89.322 Pressure ulcer of left buttock, stage 2 06/25/2021 No Yes L97.821 Non-pressure chronic ulcer of other part of left lower leg limited to breakdown 06/25/2021 No Yes of skin L97.811 Non-pressure chronic ulcer of other part of right lower leg limited to breakdown 06/25/2021 No Yes of skin Inactive Problems Resolved Problems Electronic Signature(s) Signed: 06/25/2021 5:02:06 PM By: Linton Ham MD Entered By: Linton Ham on 06/25/2021 10:14:24 -------------------------------------------------------------------------------- Progress Note Details Patient Name: Date of Service: Katrina Amel D. 06/25/2021 9:00 A M Medical Record Number: [id number] Patient Account Number: [id number] Date of Birth/Sex: Treating RN: 1932/06/27 (87 y.o. Katrina [name] Primary Care Provider: [name] Other Clinician: Referring Provider: Treating Provider/Extender: [name] in Treatment: 0 Subjective History of Present Illness (HPI) The following HPI elements were documented for the patient's wound: Location: right lower extremity Quality: Patient reports experiencing a dull pain to affected area(s). Severity: Patient states  wound(s) are getting better. Duration: Patient has had the wound for 3 months prior to presenting for treatment Context: The wound would happen gradually Modifying Factors: Patient wound(s)/ulcer(s) are worsening due to :standing for a long while. Past medical history of pulmonary emboli, hypothyroidism, hyperlipidemia, hypertension, sleep apnea, gallstones, ulcerated lower extremities, atrial fibrillation, status post total knee replacement, cholecystectomy, cystoscopy and stent placement for kidney stones. She has never been a smoker. About a year ago she was seen by [name] with an ulcer of her right lower extremity which she's had for about over 3 months. On 10/23/14 -- Lower extremity venous duplex evaluation. She had no evidence of deep venous thromboses involving both lower extremities. There was evidence of deep vein reflux in the right and left common femoral veins. She also had evidence of great saphenous vein reflux noted only at the level of the saphenofemoral junction. No evidence of greater or small saphenous vein reflux. She will need a consult to the vascular surgeon. Seen by Dr. Mathis Fare office note dated 11/13/2014. He noted that she had good arterial blood flow with palpable DP pulses bilaterally. Based on the patient's history Dr. Scot Dock recommended elevation when at rest of both feet and knees and ankles above heart level in the supine position. Activity such as walking and pool exercises were recommended. She will also have 15-20 mm thigh-high compression stockings daily once the ulcers have completely healed. A prescription was given to her. 11/04/2015 -- the wound on her right lower extremity has completely healed and her edema has gone down significantly. However in her popliteal fossa where she's got a lot of fungal infection there is an area which is now an open wound and this will need attention. 12/02/2015 -- the right lower extremity looks much better than the edema has gone  down significantly however she continues to have significant edema on the left side today. 12/09/2015 -- the patient has been doing her dressings as advised and says that overall she feels much better. She has received her juxta light for the left lower extremity READMISSION 06/25/2021 This is a patient who is actually had several previous visits to our clinic in 2014, 2016 and more recently 2017. This was largely because of wounds on her lower legs in the setting of chronic venous insufficiency and secondary  lymphedema. Looking at my records she was apparently prescribed thigh-high compression stockings probably by vein and vascular although she comes back in not having worn any stockings in quite a period of time. She is now in [facility] She thinks her wounds have been there since April. This includes the left lateral, right anterior and right posterior lateral parts of both lower legs. She also has a stage II pressure ulcer on her left glued I think which is happened because of sleeping in a lift chair. She had ABDs and kerlix on her bilateral lower legs. Past medical history reviewed she has hypothyroidism, left buttock pressure ulcer dating back to March of this year, iron deficiency, falls, paroxysmal atrial fibrillation anticoagulated and chronic venous insufficiency ABIs in our clinic were 0.86 on the right and 0.96 on the left Patient History Information obtained from Patient. Allergies penicillin, Cephalosporins, codeine Family History Cancer - Siblings, Heart Disease - Mother,Father, Hypertension - Father, No family history of Hereditary Spherocytosis, Kidney Disease, Lung Disease, Seizures, Stroke, Thyroid Problems, Tuberculosis. Social History Never smoker, Marital Status - Single, Alcohol Use - Never, Drug Use - No History, Caffeine Use - Rarely. Medical History Eyes Denies history of Cataracts, Glaucoma, Optic  Neuritis Ear/Nose/Mouth/Throat Denies history of Chronic sinus problems/congestion, Middle ear problems Hematologic/Lymphatic Denies history of Anemia, Hemophilia, Human Immunodeficiency Virus, Lymphedema, Sickle Cell Disease Respiratory Patient has history of Sleep Apnea - does not use Cpap or 02 as ordered Denies history of Aspiration, Asthma, Chronic Obstructive Pulmonary Disease (COPD), Pneumothorax, Tuberculosis Cardiovascular Patient has history of Arrhythmia - A-Fib, Hypertension, Phlebitis Denies history of Angina, Congestive Heart Failure, Coronary Artery Disease, Deep Vein Thrombosis, Hypotension, Myocardial Infarction, Peripheral Arterial Disease, Peripheral Venous Disease, Vasculitis Gastrointestinal Denies history of Cirrhosis , Colitis, Crohnoos, Hepatitis A, Hepatitis B, Hepatitis C Endocrine Denies history of Type I Diabetes, Type II Diabetes Genitourinary Denies history of End Stage Renal Disease Immunological Denies history of Lupus Erythematosus, Raynaudoos, Scleroderma Integumentary (Skin) Denies history of History of Burn Musculoskeletal Denies history of Gout, Rheumatoid Arthritis, Osteoarthritis, Osteomyelitis Neurologic Denies history of Dementia, Neuropathy, Quadriplegia, Paraplegia, Seizure Disorder Oncologic Denies history of Received Chemotherapy, Received Radiation Psychiatric Denies history of Anorexia/bulimia, Confinement Anxiety Medical A Surgical History Notes nd Cardiovascular Hyperlipidemia Endocrine Hypothyroidism Review of Systems (ROS) Constitutional Symptoms (General Health) Denies complaints or symptoms of Fatigue, Fever, Chills, Marked Weight Change. Eyes Complains or has symptoms of Glasses / Contacts - glasses. Ear/Nose/Mouth/Throat Denies complaints or symptoms of Chronic sinus problems or rhinitis. Gastrointestinal Denies complaints or symptoms of Frequent diarrhea, Nausea, Vomiting. Endocrine Denies complaints or symptoms  of Heat/cold intolerance. Genitourinary Denies complaints or symptoms of Frequent urination. Integumentary (Skin) Complains or has symptoms of Wounds - wounds on bilateral lower legs and gluteus. Musculoskeletal Denies complaints or symptoms of Muscle Pain, Muscle Weakness. Neurologic Denies complaints or symptoms of Numbness/parasthesias. Psychiatric Denies complaints or symptoms of Claustrophobia, Suicidal. Objective Constitutional Patient is hypertensive.. Pulse regular and within target range for patient.Marland Kitchen Respirations regular, non-labored and within target range.. Temperature is normal and within the target range for the patient.Marland Kitchen Appears in no distress. Vitals Time Taken: 9:04 AM, Height: 61 in, Source: Stated, Weight: 221 lbs, Source: Stated, BMI: 41.8, Temperature: 97.9 F, Pulse: 81 bpm, Respiratory Rate: 18 breaths/min, Blood Pressure: 158/75 mmHg. Cardiovascular Pedal pulses palpable bilaterally. She has stage III lymphedema bilaterally with lipodermatosclerosis more distally towards the ankles. Very dry flaking inflamed skin with denuded epithelium.. General Notes: Wound exam oo  On her legs she has areas on the left anterior and left lateral. These are superficial wounds. On the right lateral and posterior all of these superficial areas limited to breakdown of skin. She has severe bilateral uncontrolled chronic venous hypertension, inflammation with secondary lymphedema oo On her left buttock she has 2 small open areas medially close to the gluteal cleft. This looks like a "pinched" area from sitting and sleeping too long in a chair. I think we should do well here if she is able to get the pressure off these areas although talking to her I am still concerned. It starts with sleeping in a bed and apparently she says she is already started this Integumentary (Hair, Skin) Severe stasis dermatitis in the bilateral lower legs with lipodermatosclerosis in the distal one third of  her legs with uncontrolled lymphedema above this.. Wound #10 status is Open. Original cause of wound was Gradually Appeared. The date acquired was: 10/21/2020. The wound is located on the Left,Lateral Lower Leg. The wound measures 7cm length x 7cm width x 0.1cm depth; 38.485cm^2 area and 3.848cm^3 volume. There is Fat Layer (Subcutaneous Tissue) exposed. There is no tunneling or undermining noted. There is a medium amount of serous drainage noted. The wound margin is flat and intact. There is large (67-100%) pink granulation within the wound bed. There is no necrotic tissue within the wound bed. Wound #11 status is Open. Original cause of wound was Gradually Appeared. The date acquired was: 10/21/2020. The wound is located on the Right,Anterior Lower Leg. The wound measures 3.8cm length x 3cm width x 0.1cm depth; 8.954cm^2 area and 0.895cm^3 volume. There is Fat Layer (Subcutaneous Tissue) exposed. There is no tunneling or undermining noted. There is a medium amount of serous drainage noted. The wound margin is flat and intact. There is large (67-100%) pink granulation within the wound bed. There is no necrotic tissue within the wound bed. Wound #12 status is Open. Original cause of wound was Gradually Appeared. The date acquired was: 10/21/2020. The wound is located on the Right,Lateral Lower Leg. The wound measures 11.5cm length x 9cm width x 0.1cm depth; 81.289cm^2 area and 8.129cm^3 volume. There is Fat Layer (Subcutaneous Tissue) exposed. There is no tunneling or undermining noted. There is a large amount of serous drainage noted. The wound margin is flat and intact. There is large (67- 100%) pink granulation within the wound bed. There is no necrotic tissue within the wound bed. Wound #13 status is Open. Original cause of wound was Gradually Appeared. The date acquired was: 10/21/2020. The wound is located on the Left Gluteus. The wound measures 1cm length x 0.5cm width x 0.1cm depth; 0.393cm^2 area and  0.039cm^3 volume. There is Fat Layer (Subcutaneous Tissue) exposed. There is no tunneling or undermining noted. There is a medium amount of serosanguineous drainage noted. The wound margin is flat and intact. There is large (67- 100%) red granulation within the wound bed. There is no necrotic tissue within the wound bed. Assessment Active Problems ICD-10 Chronic venous hypertension (idiopathic) with ulcer and inflammation of bilateral lower extremity Lymphedema, not elsewhere classified Pressure ulcer of left buttock, stage 2 Non-pressure chronic ulcer of other part of left lower leg limited to breakdown of skin Non-pressure chronic ulcer of other part of right lower leg limited to breakdown of skin Procedures Wound #10 Pre-procedure diagnosis of Wound #10 is a Venous Leg Ulcer located on the Left,Lateral Lower Leg . There was a Three Layer Compression Therapy Procedure by Levan Hurst,  RN. Post procedure Diagnosis Wound #10: Same as Pre-Procedure Wound #11 Pre-procedure diagnosis of Wound #11 is a Venous Leg Ulcer located on the Right,Anterior Lower Leg . There was a Three Layer Compression Therapy Procedure by Levan Hurst, RN. Post procedure Diagnosis Wound #11: Same as Pre-Procedure Wound #12 Pre-procedure diagnosis of Wound #12 is a Venous Leg Ulcer located on the Right,Lateral Lower Leg . There was a Three Layer Compression Therapy Procedure by Levan Hurst, RN. Post procedure Diagnosis Wound #12: Same as Pre-Procedure Plan Follow-up Appointments: Return Appointment in 1 week. - with Dr. Arcola Jansky Shower/ Hygiene: May shower with protection but do not get wound dressing(s) wet. - Ok to use cast protector Edema Control - Lymphedema / SCD / Other: Elevate legs to the level of the heart or above for 30 minutes daily and/or when sitting, a frequency of: - throughout the day Avoid standing for long periods of time. Exercise regularly Moisturize legs daily. WOUND #10: -  Lower Leg Wound Laterality: Left, Lateral Cleanser: Soap and Water 3 x Per Week/7 Days Discharge Instructions: May shower and wash wound with dial antibacterial soap and water prior to dressing change. Cleanser: Wound Cleanser 3 x Per Week/7 Days Discharge Instructions: Cleanse the wound with wound cleanser prior to applying a clean dressing using gauze sponges, not tissue or cotton balls. Peri-Wound Care: Triamcinolone 15 (g) 3 x Per Week/7 Days Discharge Instructions: Use triamcinolone 15 (g) as directed Peri-Wound Care: Sween Lotion (Moisturizing lotion) 3 x Per Week/7 Days Discharge Instructions: Apply moisturizing lotion as directed Prim Dressing: KerraCel Ag Gelling Fiber Dressing, 4x5 in (silver alginate) 3 x Per Week/7 Days ary Discharge Instructions: Apply silver alginate to wound bed as instructed Secondary Dressing: Woven Gauze Sponge, Non-Sterile 4x4 in 3 x Per Week/7 Days Discharge Instructions: Apply over primary dressing as directed. Secondary Dressing: ABD Pad, 8x10 3 x Per Week/7 Days Discharge Instructions: Apply over primary dressing as directed. Com pression Wrap: ThreePress (3 layer compression wrap) 3 x Per Week/7 Days Discharge Instructions: Apply three layer compression as directed. WOUND #11: - Lower Leg Wound Laterality: Right, Anterior Cleanser: Soap and Water 3 x Per Week/7 Days Discharge Instructions: May shower and wash wound with dial antibacterial soap and water prior to dressing change. Cleanser: Wound Cleanser 3 x Per Week/7 Days Discharge Instructions: Cleanse the wound with wound cleanser prior to applying a clean dressing using gauze sponges, not tissue or cotton balls. Peri-Wound Care: Triamcinolone 15 (g) 3 x Per Week/7 Days Discharge Instructions: Use triamcinolone 15 (g) as directed Peri-Wound Care: Sween Lotion (Moisturizing lotion) 3 x Per Week/7 Days Discharge Instructions: Apply moisturizing lotion as directed Prim Dressing: KerraCel Ag Gelling  Fiber Dressing, 4x5 in (silver alginate) 3 x Per Week/7 Days ary Discharge Instructions: Apply silver alginate to wound bed as instructed Secondary Dressing: Woven Gauze Sponge, Non-Sterile 4x4 in 3 x Per Week/7 Days Discharge Instructions: Apply over primary dressing as directed. Secondary Dressing: ABD Pad, 8x10 3 x Per Week/7 Days Discharge Instructions: Apply over primary dressing as directed. Com pression Wrap: ThreePress (3 layer compression wrap) 3 x Per Week/7 Days Discharge Instructions: Apply three layer compression as directed. WOUND #12: - Lower Leg Wound Laterality: Right, Lateral Cleanser: Soap and Water 3 x Per Week/7 Days Discharge Instructions: May shower and wash wound with dial antibacterial soap and water prior to dressing change. Cleanser: Wound Cleanser 3 x Per Week/7 Days Discharge Instructions: Cleanse the wound with wound cleanser prior to applying a clean dressing using gauze  sponges, not tissue or cotton balls. Peri-Wound Care: Triamcinolone 15 (g) 3 x Per Week/7 Days Discharge Instructions: Use triamcinolone 15 (g) as directed Peri-Wound Care: Sween Lotion (Moisturizing lotion) 3 x Per Week/7 Days Discharge Instructions: Apply moisturizing lotion as directed Prim Dressing: KerraCel Ag Gelling Fiber Dressing, 4x5 in (silver alginate) 3 x Per Week/7 Days ary Discharge Instructions: Apply silver alginate to wound bed as instructed Secondary Dressing: Woven Gauze Sponge, Non-Sterile 4x4 in 3 x Per Week/7 Days Discharge Instructions: Apply over primary dressing as directed. Secondary Dressing: ABD Pad, 8x10 3 x Per Week/7 Days Discharge Instructions: Apply over primary dressing as directed. Com pression Wrap: ThreePress (3 layer compression wrap) 3 x Per Week/7 Days Discharge Instructions: Apply three layer compression as directed. WOUND #13: - Gluteus Wound Laterality: Left Cleanser: Wound Cleanser 3 x Per Week/7 Days Discharge Instructions: Cleanse the wound with  wound cleanser prior to applying a clean dressing using gauze sponges, not tissue or cotton balls. Peri-Wound Care: Skin Prep 3 x Per Week/7 Days Discharge Instructions: Use skin prep as directed Prim Dressing: KerraCel Ag Gelling Fiber Dressing, 2x2 in (silver alginate) 3 x Per Week/7 Days ary Discharge Instructions: Apply silver alginate to wound bed as instructed Secondary Dressing: Bordered Gauze, 4x4 in 3 x Per Week/7 Days Discharge Instructions: Apply over primary dressing as directed. 1. We are using silver alginate's into that ABDs under 3 layer compression bilaterally. We might be able to do 4-layer compression if that is necessary 2. Liberal TCA and moisturizer under the compression. 3. They may be able to change this in the assisted living level [facility] [place] if so it would be nice for them to change this 2 times a week and we will see her back next Thursday 4. The patient has uncontrolled chronic lymphedema likely secondary to chronic venous hypertension. I have we have discharged her in 2017 with compression stockings I do not think she is worn them at least not recently she will definitely need to transition back into this. External compression pumps would not be out of the thought process here. 5. Her skin in her lower legs was very inflamed but nontender this is almost assuredly chronic venous inflammation rather than cellulitis. I spent 45 minutes in review of this patient's past medical history, face-to-face evaluation and preparation of this record Electronic Signature(s) Signed: 06/25/2021 5:02:06 PM By: Linton Ham MD Entered By: Linton Ham on 06/25/2021 10:34:44 -------------------------------------------------------------------------------- HxROS Details Patient Name: Date of Service: Katrina Amel D. 06/25/2021 9:00 A M Medical Record Number: [id number] Patient Account Number: [id number] Date of Birth/Sex: Treating RN: 26-Dec-1931 (87 y.o. [name] Primary Care Provider: [name] Other Clinician: Referring Provider: Treating Provider/Extender: [name] in Treatment: 0 Information Obtained From Patient Constitutional Symptoms (General Health) Complaints and Symptoms: Negative for: Fatigue; Fever; Chills; Marked Weight Change Eyes Complaints and Symptoms: Positive for: Glasses / Contacts - glasses Medical History: Negative for: Cataracts; Glaucoma; Optic Neuritis Ear/Nose/Mouth/Throat Complaints and Symptoms: Negative for: Chronic sinus problems or rhinitis Medical History: Negative for: Chronic sinus problems/congestion; Middle ear problems Gastrointestinal Complaints and Symptoms: Negative for: Frequent diarrhea; Nausea; Vomiting Medical History: Negative for: Cirrhosis ; Colitis; Crohns; Hepatitis A; Hepatitis B; Hepatitis C Endocrine Complaints and Symptoms: Negative for: Heat/cold intolerance Medical History: Negative for: Type I Diabetes; Type II Diabetes Past Medical History Notes: Hypothyroidism Genitourinary Complaints and Symptoms: Negative for: Frequent urination Medical History: Negative for: End Stage Renal Disease Integumentary (Skin) Complaints and Symptoms:  Positive for: Wounds - wounds on bilateral lower legs and gluteus Medical History: Negative for: History of Burn Musculoskeletal Complaints and Symptoms: Negative for: Muscle Pain; Muscle Weakness Medical History: Negative for: Gout; Rheumatoid Arthritis; Osteoarthritis; Osteomyelitis Neurologic Complaints and Symptoms: Negative for: Numbness/parasthesias Medical History: Negative for: Dementia; Neuropathy; Quadriplegia; Paraplegia; Seizure Disorder Psychiatric Complaints and Symptoms: Negative for: Claustrophobia; Suicidal Medical History: Negative for: Anorexia/bulimia; Confinement Anxiety Hematologic/Lymphatic Medical History: Negative for: Anemia; Hemophilia; Human Immunodeficiency  Virus; Lymphedema; Sickle Cell Disease Respiratory Medical History: Positive for: Sleep Apnea - does not use Cpap or 02 as ordered Negative for: Aspiration; Asthma; Chronic Obstructive Pulmonary Disease (COPD); Pneumothorax; Tuberculosis Cardiovascular Medical History: Positive for: Arrhythmia - A-Fib; Hypertension; Phlebitis Negative for: Angina; Congestive Heart Failure; Coronary Artery Disease; Deep Vein Thrombosis; Hypotension; Myocardial Infarction; Peripheral Arterial Disease; Peripheral Venous Disease; Vasculitis Past Medical History Notes: Hyperlipidemia Immunological Medical History: Negative for: Lupus Erythematosus; Raynauds; Scleroderma Oncologic Medical History: Negative for: Received Chemotherapy; Received Radiation Immunizations Pneumococcal Vaccine: Received Pneumococcal Vaccination: Yes Received Pneumococcal Vaccination On or After 60th Birthday: No Implantable Devices None Family and Social History Cancer: Yes - Siblings; Heart Disease: Yes - Mother,Father; Hereditary Spherocytosis: No; Hypertension: Yes - Father; Kidney Disease: No; Lung Disease: No; Seizures: No; Stroke: No; Thyroid Problems: No; Tuberculosis: No; Never smoker; Marital Status - Single; Alcohol Use: Never; Drug Use: No History; Caffeine Use: Rarely; Financial Concerns: No; Food, Clothing or Shelter Needs: No; Support System Lacking: No; Transportation Concerns: No Engineer, maintenance) Signed: 06/25/2021 5:02:06 PM By: Linton Ham MD Signed: 06/25/2021 5:51:35 PM By: Levan Hurst RN, BSN Entered By: Levan Hurst on 06/25/2021 09:46:35 -------------------------------------------------------------------------------- [place] Details Patient Name: Date of Service: Katrina Amel D. 06/25/2021 Medical Record Number: [id number] Patient Account Number: [id number] Date of Birth/Sex: Treating RN: 09/21/1931 (87 y.o. [name], Meta.Reding Primary Care Provider: [name] Other  Clinician: Referring Provider: Treating Provider/Extender: [name] in Treatment: 0 Diagnosis Coding ICD-10 Codes Code Description [phone] Chronic venous hypertension (idiopathic) with ulcer and inflammation of bilateral lower extremity I89.0 Lymphedema, not elsewhere classified L89.322 Pressure ulcer of left buttock, stage 2 L97.821 Non-pressure chronic ulcer of other part of left lower leg limited to breakdown of skin L97.811 Non-pressure chronic ulcer of other part of right lower leg limited to breakdown of skin Facility Procedures CPT4: Code [id number] Description: 13 - WOUND CARE VISIT-LEV 3 EST PT Modifier: 25 Quantity: 1 CPT4: [id number] Description: 81 BILATERAL: Application of multi-layer venous compression system; leg (below knee), including ankle and t. Modifier: Quantity: 1 Physician Procedures : CPT4 Code Description Modifier [id number] - WC PHYS LEVEL 4 - NEW PT ICD-10 Diagnosis Description I87.333 Chronic venous hypertension (idiopathic) with ulcer and inflammation of bilateral lower extremity L89.322 Pressure ulcer of left buttock, stage  2 L97.811 Non-pressure chronic ulcer of other part of right lower leg limited to breakdown of skin L97.821 Non-pressure chronic ulcer of other part of left lower leg limited to breakdown of skin Quantity: 1 Electronic Signature(s) Signed: 06/25/2021 5:27:39 PM By: Linton Ham MD Signed: 06/25/2021 5:51:35 PM By: Levan Hurst RN, BSN Previous Signature: 06/25/2021 5:02:06 PM Version By: Linton Ham MD Entered By: Levan Hurst on 06/25/2021 17:24:23

## 2021-06-25 NOTE — Progress Notes (Signed)
Katrina [name], Katrina [name] ([id number]) Visit Report for 06/25/2021 Abuse/Suicide Risk Screen Details Patient Name: Date of Service: Katrina [name], Katrina D. 06/25/2021 9:00 A M Medical Record Number: [id number] Patient Account Number: [id number] Date of Birth/Sex: Treating RN: [birth year] (87 y.o. [name] Primary Care Daeshaun Specht: [name] Other Clinician: Referring Adiba Fargnoli: Treating Lee-Ann Gal/Extender: [name] in Treatment: 0 Abuse/Suicide Risk Screen Items Answer ABUSE RISK SCREEN: Has anyone close to you tried to hurt or harm you recentlyo No Do you feel uncomfortable with anyone in your familyo No Has anyone forced you do things that you didnt want to doo No Electronic Signature(s) Signed: 06/25/2021 5:51:35 PM By: Levan Hurst RN, BSN Entered By: Levan Hurst on 06/25/2021 09:38:28 -------------------------------------------------------------------------------- Activities of Daily Living Details Patient Name: Date of Service: Katrina Amel D. 06/25/2021 9:00 A M Medical Record Number: [id number] Patient Account Number: [id number] Date of Birth/Sex: Treating RN: 07-12-1932 (87 y.o. [name] Primary Care Denise Bramblett: [name] Other Clinician: Referring Aylissa Heinemann: Treating Comer Devins/Extender: [name] in Treatment: 0 Activities of Daily Living Items Answer Activities of Daily Living (Please select one for each item) Drive Automobile Not Able T Medications ake Completely Able Use T elephone Completely Able Care for Appearance Completely Able Use T oilet Completely Able Bath / Shower Need Assistance Dress Self Completely Able Feed Self Completely Able Walk Completely Able Get In / Out Bed Completely Able Housework Need Assistance Prepare Meals Need Assistance Handle Money Completely Able Shop for Self Need Assistance Electronic Signature(s) Signed: 06/25/2021 5:51:35 PM By: Levan Hurst RN, BSN Entered  By: Levan Hurst on 06/25/2021 09:38:59 -------------------------------------------------------------------------------- Education Screening Details Patient Name: Date of Service: Katrina Amel D. 06/25/2021 9:00 A M Medical Record Number: [id number] Patient Account Number: [id number] Date of Birth/Sex: Treating RN: September 18, 1931 (87 y.o. [name] Primary Care Asianna Brundage: [name] Other Clinician: Referring Juliet Vasbinder: Treating Oakleigh Hesketh/Extender: [name] in Treatment: 0 Primary Learner Assessed: Patient Learning Preferences/Education Level/Primary Language Learning Preference: Explanation, Demonstration, Printed Material Highest Education Level: College or Above Preferred Language: English Cognitive Barrier Language Barrier: No Translator Needed: No Memory Deficit: No Emotional Barrier: No Cultural/Religious Beliefs Affecting Medical Care: No Physical Barrier Impaired Vision: No Impaired Hearing: No Decreased Hand dexterity: No Knowledge/Comprehension Knowledge Level: High Comprehension Level: High Ability to understand written instructions: High Ability to understand verbal instructions: High Motivation Anxiety Level: Calm Cooperation: Cooperative Education Importance: Acknowledges Need Interest in Health Problems: Asks Questions Perception: Coherent Willingness to Engage in Self-Management High Activities: Readiness to Engage in Self-Management High Activities: Electronic Signature(s) Signed: 06/25/2021 5:51:35 PM By: Levan Hurst RN, BSN Entered By: Levan Hurst on 06/25/2021 09:39:20 -------------------------------------------------------------------------------- Fall Risk Assessment Details Patient Name: Date of Service: Katrina Amel D. 06/25/2021 9:00 A M Medical Record Number: [id number] Patient Account Number: [id number] Date of Birth/Sex: Treating RN: 02/22/1932 (87 y.o. [name] Primary Care Rory Montel:  [name] Other Clinician: Referring Muaaz Brau: Treating Raeley Gilmore/Extender: [name] in Treatment: 0 Fall Risk Assessment Items Have you had 2 or more falls in the last 12 monthso 0 Yes Have you had any fall that resulted in injury in the last 12 monthso 0 No FALLS RISK SCREEN History of falling - immediate or within 3 months 25 Yes Secondary diagnosis (Do you have 2 or more medical diagnoseso) 15 Yes Ambulatory aid None/bed rest/wheelchair/nurse 0 No Crutches/cane/walker 15 Yes Furniture 0 No Intravenous therapy Access/Saline/Heparin Lock 0 No Gait/Transferring Normal/ bed rest/ wheelchair 0  Yes Weak (short steps with or without shuffle, stooped but able to lift head while walking, may seek 0 No support from furniture) Impaired (short steps with shuffle, may have difficulty arising from chair, head down, impaired 0 No balance) Mental Status Oriented to own ability 0 Yes Electronic Signature(s) Signed: 06/25/2021 5:51:35 PM By: Levan Hurst RN, BSN Entered By: Levan Hurst on 06/25/2021 09:39:42 -------------------------------------------------------------------------------- Foot Assessment Details Patient Name: Date of Service: Katrina Amel D. 06/25/2021 9:00 A M Medical Record Number: [id number] Patient Account Number: [id number] Date of Birth/Sex: Treating RN: 1932/02/24 (87 y.o. [name] Primary Care Wyona Neils: [name] Other Clinician: Referring Aleyza Salmi: Treating Arrion Burruel/Extender: [name] in Treatment: 0 Foot Assessment Items Site Locations + = Sensation present, - = Sensation absent, C = Callus, U = Ulcer R = Redness, W = Warmth, M = Maceration, PU = Pre-ulcerative lesion F = Fissure, S = Swelling, D = Dryness Assessment Right: Left: Other Deformity: No No Prior Foot Ulcer: No No Prior Amputation: No No Charcot Joint: No No Ambulatory Status: Ambulatory With Help Assistance  Device: Cane Gait: Steady Electronic Signature(s) Signed: 06/25/2021 5:51:35 PM By: Levan Hurst RN, BSN Entered By: Levan Hurst on 06/25/2021 09:40:14 -------------------------------------------------------------------------------- Nutrition Risk Screening Details Patient Name: Date of Service: Katrina Amel D. 06/25/2021 9:00 A M Medical Record Number: [id number] Patient Account Number: [id number] Date of Birth/Sex: Treating RN: [birth year] (87 y.o. [name] Primary Care Anniah Glick: [name] Other Clinician: Referring Arden Tinoco: Treating Nikoletta Varma/Extender: [name] in Treatment: 0 Height (in): 61 Weight (lbs): 221 Body Mass Index (BMI): 41.8 Nutrition Risk Screening Items Score Screening NUTRITION RISK SCREEN: I have an illness or condition that made me change the kind and/or amount of food I eat 0 No I eat fewer than two meals per day 0 No I eat few fruits and vegetables, or milk products 0 No I have three or more drinks of beer, liquor or wine almost every day 0 No I have tooth or mouth problems that make it hard for me to eat 0 No I don't always have enough money to buy the food I need 0 No I eat alone most of the time 0 No I take three or more different prescribed or over-the-counter drugs a day 1 Yes Without wanting to, I have lost or gained 10 pounds in the last six months 0 No I am not always physically able to shop, cook and/or feed myself 2 Yes Nutrition Protocols Good Risk Protocol Moderate Risk Protocol 0 Provide education on nutrition High Risk Proctocol Risk Level: Moderate Risk Score: 3 Electronic Signature(s) Signed: 06/25/2021 5:51:35 PM By: Levan Hurst RN, BSN Entered By: Levan Hurst on 06/25/2021 09:39:52

## 2021-07-02 ENCOUNTER — Other Ambulatory Visit: Payer: Self-pay

## 2021-07-02 ENCOUNTER — Encounter (HOSPITAL_BASED_OUTPATIENT_CLINIC_OR_DEPARTMENT_OTHER): Payer: Medicare Other | Admitting: Internal Medicine

## 2021-07-02 DIAGNOSIS — I87333 Chronic venous hypertension (idiopathic) with ulcer and inflammation of bilateral lower extremity: Secondary | ICD-10-CM | POA: Diagnosis not present

## 2021-07-03 NOTE — Progress Notes (Signed)
Katrina [name] ([id number]) Visit Report for 07/02/2021 HPI Details Patient Name: Date of Service: Katrina [name], Katrina D. 07/02/2021 10:15 A M Medical Record Number: [id number] Patient Account Number: [id number] Date of Birth/Sex: Treating RN: 1932-04-04 (87 y.o. Katrina [name] Primary Care Provider: [name] Other Clinician: Referring Provider: Treating Provider/Extender: [name] in Treatment: 1 History of Present Illness Location: right lower extremity Quality: Patient reports experiencing a dull pain to affected area(s). Severity: Patient states wound(s) are getting better. Duration: Patient has had the wound for 3 months prior to presenting for treatment Context: The wound would happen gradually Modifying Factors: Patient wound(s)/ulcer(s) are worsening due to :standing for a long while. HPI Description: Past medical history of pulmonary emboli, hypothyroidism, hyperlipidemia, hypertension, sleep apnea, gallstones, ulcerated lower extremities, atrial fibrillation, status post total knee replacement, cholecystectomy, cystoscopy and stent placement for kidney stones. She has never been a smoker. About a year ago she was seen by [name] with an ulcer of her right lower extremity which she's had for about over 3 months. On 10/23/14 -- Lower extremity venous duplex evaluation. She had no evidence of deep venous thromboses involving both lower extremities. There was evidence of deep vein reflux in the right and left common femoral veins. She also had evidence of great saphenous vein reflux noted only at the level of the saphenofemoral junction. No evidence of greater or small saphenous vein reflux. She will need a consult to the vascular surgeon. Seen by Dr. Mathis Fare office note dated 11/13/2014. He noted that she had good arterial blood flow with palpable DP pulses bilaterally. Based on the patient's history Dr. Scot Dock recommended elevation when at rest of both feet  and knees and ankles above heart level in the supine position. Activity such as walking and pool exercises were recommended. She will also have 15-20 mm thigh-high compression stockings daily once the ulcers have completely healed. A prescription was given to her. 11/04/2015 -- the wound on her right lower extremity has completely healed and her edema has gone down significantly. However in her popliteal fossa where she's got a lot of fungal infection there is an area which is now an open wound and this will need attention. 12/02/2015 -- the right lower extremity looks much better than the edema has gone down significantly however she continues to have significant edema on the left side today. 12/09/2015 -- the patient has been doing her dressings as advised and says that overall she feels much better. She has received her juxta light for the left lower extremity READMISSION 06/25/2021 This is a patient who is actually had several previous visits to our clinic in 2014, 2016 and more recently 2017. This was largely because of wounds on her lower legs in the setting of chronic venous insufficiency and secondary lymphedema. Looking at my records she was apparently prescribed thigh-high compression stockings probably by vein and vascular although she comes back in not having worn any stockings in quite a period of time. She is now in the assisted living [facility] of [facility]. She thinks her wounds have been there since April. This includes the left lateral, right anterior and right posterior lateral parts of both lower legs. She also has a stage II pressure ulcer on her left glued I think which is happened because of sleeping in a lift chair. She had ABDs and kerlix on her bilateral lower legs. Past medical history reviewed she has hypothyroidism, left buttock pressure ulcer dating back to  March of this year, iron deficiency, falls, paroxysmal atrial fibrillation  anticoagulated and chronic venous insufficiency ABIs in our clinic were 0.86 on the right and 0.96 on the left 11/10; patient comes into clinic today after being admitted last week with uncontrolled lymphedema, severe venous insufficiency and some superficial wounds on her bilateral lower legs. We put her in 3 layer compression and she comes in today with all the wounds closed and considerable improvement in the condition of her lower legs. The patient is going to need lifelong compression stockings probably juxta lites and we are going to go ahead and order those for her today. Unfortunately the POA here is a niece in [place] were probably going to have to talk with her to arrange proper payment for the juxta lite stockings. She is in the assisted living level of [facility]. She does not feel she can get the stockings on independently I am hopeful that the nurse and aids at the assisted living can help her with this otherwise this may be a problem. She also has an area on her left buttock which is a stage II wound. A lot of unhealthy irritated skin around this I think from moisture prolonged sittingo Incontinence Electronic Signature(s) Signed: 07/02/2021 5:10:51 PM By: Linton Ham MD Entered By: Linton Ham on 07/02/2021 11:01:12 -------------------------------------------------------------------------------- Physical Exam Details Patient Name: Date of Service: Katrina Amel D. 07/02/2021 10:15 A M Medical Record Number: [id number] Patient Account Number: [id number] Date of Birth/Sex: Treating RN: 1931-12-08 (87 y.o. Katrina [name] Primary Care Provider: [name] Other Clinician: Referring Provider: Treating Provider/Extender: [name] in Treatment: 1 Constitutional Patient is hypertensive.. Pulse regular and within target range for patient.Marland Kitchen Respirations regular, non-labored and within target range.. Temperature is normal  and within the target range for the patient.Marland Kitchen Appears in no distress. Cardiovascular Considerable improvement in the swelling of both legs. Stasis dermatitis is resolved. She does have hemosiderin staining bilaterally.. Notes Wound exam; the areas on the left anterior left lateral right lateral and right posterior are all healed. Considerable improvement in the edema of both legs and as a result of the severe same stasis dermatitis is resolved. She does not have an open wound on either leg. Left buttock wound this seems more superficial. I am concerned about the extensive irritation of the skin on both buttocks. Electronic Signature(s) Signed: 07/02/2021 5:10:51 PM By: Linton Ham MD Entered By: Linton Ham on 07/02/2021 11:03:50 -------------------------------------------------------------------------------- Physician Orders Details Patient Name: Date of Service: Katrina Amel D. 07/02/2021 10:15 A M Medical Record Number: [id number] Patient Account Number: [id number] Date of Birth/Sex: Treating RN: 08/10/1932 (87 y.o. [name], Lauren Primary Care Provider: [name] Other Clinician: Referring Provider: Treating Provider/Extender: [name] in Treatment: 1 Verbal / Phone Orders: No Diagnosis Coding Follow-up Appointments ppointment in 1 week. - with Dr. Dellia Nims Return A Bathing/ Shower/ Hygiene May shower with protection but do not get wound dressing(s) wet. - Ok to use cast protector Edema Control - Lymphedema / SCD / Other Elevate legs to the level of the heart or above for 30 minutes daily and/or when sitting, a frequency of: - throughout the day Avoid standing for long periods of time. Exercise regularly Moisturize legs daily. Wound Treatment Wound #10 - Lower Leg Wound Laterality: Left, Lateral Cleanser: Soap and Water 3 x Per Week/7 Days Discharge Instructions: May shower and wash wound with dial antibacterial soap and water  prior to dressing change. Cleanser:  Wound Cleanser 3 x Per Week/7 Days Discharge Instructions: Cleanse the wound with wound cleanser prior to applying a clean dressing using gauze sponges, not tissue or cotton balls. Peri-Wound Care: Triamcinolone 15 (g) 3 x Per Week/7 Days Discharge Instructions: Use triamcinolone 15 (g) as directed Peri-Wound Care: Sween Lotion (Moisturizing lotion) 3 x Per Week/7 Days Discharge Instructions: Apply moisturizing lotion as directed Compression Wrap: ThreePress (3 layer compression wrap) 3 x Per Week/7 Days Discharge Instructions: Apply three layer compression as directed. Compression Stockings: Circaid Juxta Lite Compression Wrap Left Leg Compression Amount: 30-40 mmHG Discharge Instructions: Apply Circaid Juxta Lite Compression Wrap daily as instructed. Apply first thing in the morning, remove at night before bed. Wound #11 - Lower Leg Wound Laterality: Right, Anterior Cleanser: Soap and Water 3 x Per Week/7 Days Discharge Instructions: May shower and wash wound with dial antibacterial soap and water prior to dressing change. Cleanser: Wound Cleanser 3 x Per Week/7 Days Discharge Instructions: Cleanse the wound with wound cleanser prior to applying a clean dressing using gauze sponges, not tissue or cotton balls. Peri-Wound Care: Triamcinolone 15 (g) 3 x Per Week/7 Days Discharge Instructions: Use triamcinolone 15 (g) as directed Peri-Wound Care: Sween Lotion (Moisturizing lotion) 3 x Per Week/7 Days Discharge Instructions: Apply moisturizing lotion as directed Compression Wrap: ThreePress (3 layer compression wrap) 3 x Per Week/7 Days Discharge Instructions: Apply three layer compression as directed. Compression Stockings: Circaid Juxta Lite Compression Wrap Right Leg Compression Amount: 30-40 mmHG Discharge Instructions: Apply Circaid Juxta Lite Compression Wrap daily as instructed. Apply first thing in the morning, remove at night before bed. Wound  #12 - Lower Leg Wound Laterality: Right, Lateral Cleanser: Soap and Water 3 x Per Week/7 Days Discharge Instructions: May shower and wash wound with dial antibacterial soap and water prior to dressing change. Cleanser: Wound Cleanser 3 x Per Week/7 Days Discharge Instructions: Cleanse the wound with wound cleanser prior to applying a clean dressing using gauze sponges, not tissue or cotton balls. Peri-Wound Care: Triamcinolone 15 (g) 3 x Per Week/7 Days Discharge Instructions: Use triamcinolone 15 (g) as directed Peri-Wound Care: Sween Lotion (Moisturizing lotion) 3 x Per Week/7 Days Discharge Instructions: Apply moisturizing lotion as directed Compression Wrap: ThreePress (3 layer compression wrap) 3 x Per Week/7 Days Discharge Instructions: Apply three layer compression as directed. Wound #13 - Gluteus Wound Laterality: Left Cleanser: Wound Cleanser 3 x Per Week/7 Days Discharge Instructions: Cleanse the wound with wound cleanser prior to applying a clean dressing using gauze sponges, not tissue or cotton balls. Peri-Wound Care: Skin Prep 3 x Per Week/7 Days Discharge Instructions: Use skin prep as directed Prim Dressing: KerraCel Ag Gelling Fiber Dressing, 2x2 in (silver alginate) 3 x Per Week/7 Days ary Discharge Instructions: Apply silver alginate to wound bed as instructed Secondary Dressing: Bordered Gauze, 4x4 in 3 x Per Week/7 Days Discharge Instructions: Apply over primary dressing as directed. Electronic Signature(s) Signed: 07/02/2021 5:10:51 PM By: Linton Ham MD Signed: 07/03/2021 12:27:53 PM By: Rhae Hammock RN Entered By: Rhae Hammock on 07/02/2021 10:45:08 -------------------------------------------------------------------------------- Problem List Details Patient Name: Date of Service: Katrina Amel D. 07/02/2021 10:15 A M Medical Record Number: [id number] Patient Account Number: [id number] Date of Birth/Sex: Treating RN: 20-Apr-1932 (87 y.o. Katrina [name] Primary Care Provider: [name] Other Clinician: Referring Provider: Treating Provider/Extender: [name] in Treatment: 1 Active Problems ICD-10 Encounter Code Description Active Date MDM Diagnosis I87.333 Chronic venous hypertension (idiopathic) with ulcer and inflammation of 06/25/2021 No  Yes bilateral lower extremity I89.0 Lymphedema, not elsewhere classified 06/25/2021 No Yes L89.322 Pressure ulcer of left buttock, stage 2 06/25/2021 No Yes L97.821 Non-pressure chronic ulcer of other part of left lower leg limited to breakdown 06/25/2021 No Yes of skin L97.811 Non-pressure chronic ulcer of other part of right lower leg limited to breakdown 06/25/2021 No Yes of skin Inactive Problems Resolved Problems Electronic Signature(s) Signed: 07/02/2021 5:10:51 PM By: Linton Ham MD Entered By: Linton Ham on 07/02/2021 10:58:53 -------------------------------------------------------------------------------- Progress Note Details Patient Name: Date of Service: Katrina Amel D. 07/02/2021 10:15 A M Medical Record Number: [id number] Patient Account Number: [id number] Date of Birth/Sex: Treating RN: [birth year] (87 y.o. Katrina [name] Primary Care Provider: [name] Other Clinician: Referring Provider: Treating Provider/Extender: [name] in Treatment: 1 Subjective History of Present Illness (HPI) The following HPI elements were documented for the patient's wound: Location: right lower extremity Quality: Patient reports experiencing a dull pain to affected area(s). Severity: Patient states wound(s) are getting better. Duration: Patient has had the wound for 3 months prior to presenting for treatment Context: The wound would happen gradually Modifying Factors: Patient wound(s)/ulcer(s) are worsening due to :standing for a long while. Past medical history of pulmonary emboli, hypothyroidism,  hyperlipidemia, hypertension, sleep apnea, gallstones, ulcerated lower extremities, atrial fibrillation, status post total knee replacement, cholecystectomy, cystoscopy and stent placement for kidney stones. She has never been a smoker. About a year ago she was seen by [name] with an ulcer of her right lower extremity which she's had for about over 3 months. On 10/23/14 -- Lower extremity venous duplex evaluation. She had no evidence of deep venous thromboses involving both lower extremities. There was evidence of deep vein reflux in the right and left common femoral veins. She also had evidence of great saphenous vein reflux noted only at the level of the saphenofemoral junction. No evidence of greater or small saphenous vein reflux. She will need a consult to the vascular surgeon. Seen by Dr. Mathis Fare office note dated 11/13/2014. He noted that she had good arterial blood flow with palpable DP pulses bilaterally. Based on the patient's history Dr. Scot Dock recommended elevation when at rest of both feet and knees and ankles above heart level in the supine position. Activity such as walking and pool exercises were recommended. She will also have 15-20 mm thigh-high compression stockings daily once the ulcers have completely healed. A prescription was given to her. 11/04/2015 -- the wound on her right lower extremity has completely healed and her edema has gone down significantly. However in her popliteal fossa where she's got a lot of fungal infection there is an area which is now an open wound and this will need attention. 12/02/2015 -- the right lower extremity looks much better than the edema has gone down significantly however she continues to have significant edema on the left side today. 12/09/2015 -- the patient has been doing her dressings as advised and says that overall she feels much better. She has received her juxta light for the left lower extremity READMISSION 06/25/2021 This is a patient  who is actually had several previous visits to our clinic in 2014, 2016 and more recently 2017. This was largely because of wounds on her lower legs in the setting of chronic venous insufficiency and secondary lymphedema. Looking at my records she was apparently prescribed thigh-high compression stockings probably by vein and vascular although she comes back in not having worn any stockings in quite a period of time.  She is now in the [facility] of [facility]. She thinks her wounds have been there since April. This includes the left lateral, right anterior and right posterior lateral parts of both lower legs. She also has a stage II pressure ulcer on her left glued I think which is happened because of sleeping in a lift chair. She had ABDs and kerlix on her bilateral lower legs. Past medical history reviewed she has hypothyroidism, left buttock pressure ulcer dating back to March of this year, iron deficiency, falls, paroxysmal atrial fibrillation anticoagulated and chronic venous insufficiency ABIs in our clinic were 0.86 on the right and 0.96 on the left 11/10; patient comes into clinic today after being admitted last week with uncontrolled lymphedema, severe venous insufficiency and some superficial wounds on her bilateral lower legs. We put her in 3 layer compression and she comes in today with all the wounds closed and considerable improvement in the condition of her lower legs. The patient is going to need lifelong compression stockings probably juxta lites and we are going to go ahead and order those for her today. Unfortunately the POA here is a niece in [place] were probably going to have to talk with her to arrange proper payment for the juxta lite stockings. She is in the assisted living level of [facility]. She does not feel she can get the stockings on independently I am hopeful that the nurse and aids at the assisted living can  help her with this otherwise this may be a problem. She also has an area on her left buttock which is a stage II wound. A lot of unhealthy irritated skin around this I think from moisture prolonged sittingo Incontinence Objective Constitutional Patient is hypertensive.. Pulse regular and within target range for patient.Marland Kitchen Respirations regular, non-labored and within target range.. Temperature is normal and within the target range for the patient.Marland Kitchen Appears in no distress. Vitals Time Taken: 10:21 AM, Height: 61 in, Weight: 221 lbs, BMI: 41.8, Temperature: 98.6 F, Pulse: 68 bpm, Respiratory Rate: 17 breaths/min, Blood Pressure: 160/74 mmHg. Cardiovascular Considerable improvement in the swelling of both legs. Stasis dermatitis is resolved. She does have hemosiderin staining bilaterally.. General Notes: Wound exam; the areas on the left anterior left lateral right lateral and right posterior are all healed. Considerable improvement in the edema of both legs and as a result of the severe same stasis dermatitis is resolved. She does not have an open wound on either leg. oo Left buttock wound this seems more superficial. I am concerned about the extensive irritation of the skin on both buttocks. Integumentary (Hair, Skin) Wound #10 status is Open. Original cause of wound was Gradually Appeared. The date acquired was: 10/21/2020. The wound has been in treatment 1 weeks. The wound is located on the Left,Lateral Lower Leg. The wound measures 0.1cm length x 0.1cm width x 0.1cm depth; 0.008cm^2 area and 0.001cm^3 volume. There is Fat Layer (Subcutaneous Tissue) exposed. There is a medium amount of serous drainage noted. The wound margin is flat and intact. There is large (67- 100%) pink granulation within the wound bed. There is no necrotic tissue within the wound bed. Wound #11 status is Open. Original cause of wound was Gradually Appeared. The date acquired was: 10/21/2020. The wound has been in treatment 1  weeks. The wound is located on the Right,Anterior Lower Leg. The wound measures 0.1cm length x 0.1cm width x 0.1cm depth; 0.008cm^2 area and 0.001cm^3 volume. There is Fat  Layer (Subcutaneous Tissue) exposed. There is a medium amount of serous drainage noted. The wound margin is flat and intact. There is large (67-100%) pink granulation within the wound bed. There is no necrotic tissue within the wound bed. Wound #12 status is Open. Original cause of wound was Gradually Appeared. The date acquired was: 10/21/2020. The wound has been in treatment 1 weeks. The wound is located on the Right,Lateral Lower Leg. The wound measures 0.1cm length x 0.1cm width x 0.1cm depth; 0.008cm^2 area and 0.001cm^3 volume. There is Fat Layer (Subcutaneous Tissue) exposed. There is a large amount of serous drainage noted. The wound margin is flat and intact. There is large (67- 100%) pink granulation within the wound bed. There is no necrotic tissue within the wound bed. Wound #13 status is Open. Original cause of wound was Gradually Appeared. The date acquired was: 10/21/2020. The wound has been in treatment 1 weeks. The wound is located on the Left Gluteus. The wound measures 1cm length x 0.5cm width x 0.1cm depth; 0.393cm^2 area and 0.039cm^3 volume. There is Fat Layer (Subcutaneous Tissue) exposed. There is a medium amount of serosanguineous drainage noted. The wound margin is flat and intact. There is large (67- 100%) red granulation within the wound bed. There is no necrotic tissue within the wound bed. Assessment Active Problems ICD-10 Chronic venous hypertension (idiopathic) with ulcer and inflammation of bilateral lower extremity Lymphedema, not elsewhere classified Pressure ulcer of left buttock, stage 2 Non-pressure chronic ulcer of other part of left lower leg limited to breakdown of skin Non-pressure chronic ulcer of other part of right lower leg limited to breakdown of skin Procedures Wound  #10 Pre-procedure diagnosis of Wound #10 is a Venous Leg Ulcer located on the Left,Lateral Lower Leg . There was a Three Layer Compression Therapy Procedure by Rhae Hammock, RN. Post procedure Diagnosis Wound #10: Same as Pre-Procedure Wound #11 Pre-procedure diagnosis of Wound #11 is a Venous Leg Ulcer located on the Right,Anterior Lower Leg . There was a Three Layer Compression Therapy Procedure by Rhae Hammock, RN. Post procedure Diagnosis Wound #11: Same as Pre-Procedure Wound #12 Pre-procedure diagnosis of Wound #12 is a Venous Leg Ulcer located on the Right,Lateral Lower Leg . There was a Three Layer Compression Therapy Procedure by Rhae Hammock, RN. Post procedure Diagnosis Wound #12: Same as Pre-Procedure Plan Follow-up Appointments: Return Appointment in 1 week. - with Dr. Arcola Jansky Shower/ Hygiene: May shower with protection but do not get wound dressing(s) wet. - Ok to use cast protector Edema Control - Lymphedema / SCD / Other: Elevate legs to the level of the heart or above for 30 minutes daily and/or when sitting, a frequency of: - throughout the day Avoid standing for long periods of time. Exercise regularly Moisturize legs daily. WOUND #10: - Lower Leg Wound Laterality: Left, Lateral Cleanser: Soap and Water 3 x Per Week/7 Days Discharge Instructions: May shower and wash wound with dial antibacterial soap and water prior to dressing change. Cleanser: Wound Cleanser 3 x Per Week/7 Days Discharge Instructions: Cleanse the wound with wound cleanser prior to applying a clean dressing using gauze sponges, not tissue or cotton balls. Peri-Wound Care: Triamcinolone 15 (g) 3 x Per Week/7 Days Discharge Instructions: Use triamcinolone 15 (g) as directed Peri-Wound Care: Sween Lotion (Moisturizing lotion) 3 x Per Week/7 Days Discharge Instructions: Apply moisturizing lotion as directed Com pression Wrap: ThreePress (3 layer compression wrap) 3 x Per Week/7  Days Discharge Instructions: Apply three layer compression as directed. Com  pression Stockings: Circaid Juxta Lite Compression Wrap Compression Amount: 30-40 mmHg (left) Discharge Instructions: Apply Circaid Juxta Lite Compression Wrap daily as instructed. Apply first thing in the morning, remove at night before bed. WOUND #11: - Lower Leg Wound Laterality: Right, Anterior Cleanser: Soap and Water 3 x Per Week/7 Days Discharge Instructions: May shower and wash wound with dial antibacterial soap and water prior to dressing change. Cleanser: Wound Cleanser 3 x Per Week/7 Days Discharge Instructions: Cleanse the wound with wound cleanser prior to applying a clean dressing using gauze sponges, not tissue or cotton balls. Peri-Wound Care: Triamcinolone 15 (g) 3 x Per Week/7 Days Discharge Instructions: Use triamcinolone 15 (g) as directed Peri-Wound Care: Sween Lotion (Moisturizing lotion) 3 x Per Week/7 Days Discharge Instructions: Apply moisturizing lotion as directed Com pression Wrap: ThreePress (3 layer compression wrap) 3 x Per Week/7 Days Discharge Instructions: Apply three layer compression as directed. Com pression Stockings: Circaid Juxta Lite Compression Wrap Compression Amount: 30-40 mmHg (right) Discharge Instructions: Apply Circaid Juxta Lite Compression Wrap daily as instructed. Apply first thing in the morning, remove at night before bed. WOUND #12: - Lower Leg Wound Laterality: Right, Lateral Cleanser: Soap and Water 3 x Per Week/7 Days Discharge Instructions: May shower and wash wound with dial antibacterial soap and water prior to dressing change. Cleanser: Wound Cleanser 3 x Per Week/7 Days Discharge Instructions: Cleanse the wound with wound cleanser prior to applying a clean dressing using gauze sponges, not tissue or cotton balls. Peri-Wound Care: Triamcinolone 15 (g) 3 x Per Week/7 Days Discharge Instructions: Use triamcinolone 15 (g) as directed Peri-Wound Care: Sween  Lotion (Moisturizing lotion) 3 x Per Week/7 Days Discharge Instructions: Apply moisturizing lotion as directed Com pression Wrap: ThreePress (3 layer compression wrap) 3 x Per Week/7 Days Discharge Instructions: Apply three layer compression as directed. WOUND #13: - Gluteus Wound Laterality: Left Cleanser: Wound Cleanser 3 x Per Week/7 Days Discharge Instructions: Cleanse the wound with wound cleanser prior to applying a clean dressing using gauze sponges, not tissue or cotton balls. Peri-Wound Care: Skin Prep 3 x Per Week/7 Days Discharge Instructions: Use skin prep as directed Prim Dressing: KerraCel Ag Gelling Fiber Dressing, 2x2 in (silver alginate) 3 x Per Week/7 Days ary Discharge Instructions: Apply silver alginate to wound bed as instructed Secondary Dressing: Bordered Gauze, 4x4 in 3 x Per Week/7 Days Discharge Instructions: Apply over primary dressing as directed. #1 I continued with silver alginate to the left buttock wound this is a superficial stage II 2. If I can get this to close she is going to need some treatment to the skin here. Perhaps a course of combination of steroid and antifungal and then some form of protective skin cream i.e. Coloplasto I think this is a chronic irritation issue, excessive pressure and friction, sweating,o Incontinence 3. There is a real social issue here surrounding the stockings on her lower legs. By her own admission the patient states she has not been to be able to do this herself. Hopefully they can do this at the assisted living level [facility]. She also states she wants to go back to the independent level but I am doubtful help with stockings will allow her to do that 4. Finally I gather she has a niece in [place] who is her at least her financial POA will need to talk to that person to get payment for the juxta lite stocking 5. We will see her back next week Electronic Signature(s) Signed: 07/02/2021  5:10:51 PM By:  Linton Ham MD Entered By: Linton Ham on 07/02/2021 11:06:56 -------------------------------------------------------------------------------- SuperBill Details Patient Name: Date of Service: Katrina Amel D. 07/02/2021 Medical Record Number: [id number] Patient Account Number: [id number] Date of Birth/Sex: Treating RN: 03/27/1932 (87 y.o. [name], Lauren Primary Care Provider: [name] Other Clinician: Referring Provider: Treating Provider/Extender: [name] in Treatment: 1 Diagnosis Coding ICD-10 Codes Code Description [phone] Chronic venous hypertension (idiopathic) with ulcer and inflammation of bilateral lower extremity I89.0 Lymphedema, not elsewhere classified L89.322 Pressure ulcer of left buttock, stage 2 L97.821 Non-pressure chronic ulcer of other part of left lower leg limited to breakdown of skin L97.811 Non-pressure chronic ulcer of other part of right lower leg limited to breakdown of skin Facility Procedures CPT4: Code [id number] Description: 81 BILATERAL: Application of multi-layer venous compression system; leg (below knee), including ankle and t. Modifier: Quantity: 1 Physician Procedures Electronic Signature(s) Signed: 07/02/2021 5:10:51 PM By: Linton Ham MD Entered By: Linton Ham on 07/02/2021 11:07:22

## 2021-07-03 NOTE — Progress Notes (Signed)
AUTUMNROSE, YORE ([id number]) Visit Report for 07/02/2021 Arrival Information Details Patient Name: Date of Service: Katrina [name], Katrina D. 07/02/2021 10:15 A M Medical Record Number: [id number] Patient Account Number: [id number] Date of Birth/Sex: Treating RN: 05-Dec-1931 (87 y.o. [name], Lauren Primary Care Louden Houseworth: [name] Other Clinician: Referring Allisen Pidgeon: Treating Joron Velis/Extender: [name] in Treatment: 1 Visit Information History Since Last Visit Added or deleted any medications: No Patient Arrived: Gilford Rile Any new allergies or adverse reactions: No Arrival Time: 10:21 Had a fall or experienced change in No Accompanied By: self activities of daily living that may affect Transfer Assistance: Manual risk of falls: Patient Identification Verified: Yes Signs or symptoms of abuse/neglect since last visito No Secondary Verification Process Completed: Yes Hospitalized since last visit: No Patient Requires Transmission-Based Precautions: No Implantable device outside of the clinic excluding No Patient Has Alerts: Yes cellular tissue based products placed in the center Patient Alerts: Patient on Blood Thinner since last visit: Has Dressing in Place as Prescribed: Yes Has Compression in Place as Prescribed: Yes Pain Present Now: No Electronic Signature(s) Signed: 07/03/2021 12:27:53 PM By: Rhae Hammock RN Entered By: Rhae Hammock on 07/02/2021 10:21:44 -------------------------------------------------------------------------------- Compression Therapy Details Patient Name: Date of Service: Katrina Amel D. 07/02/2021 10:15 A M Medical Record Number: [id number] Patient Account Number: [id number] Date of Birth/Sex: Treating RN: 27-Mar-1932 (87 y.o. [name], Lauren Primary Care Annaliese Saez: [name] Other Clinician: Referring Infinity Jeffords: Treating Shemiah Rosch/Extender: [name] in Treatment:  1 Compression Therapy Performed for Wound Assessment: Wound #10 Left,Lateral Lower Leg Performed By: Clinician Rhae Hammock, RN Compression Type: Three Layer Post Procedure Diagnosis Same as Pre-procedure Electronic Signature(s) Signed: 07/03/2021 12:27:53 PM By: Rhae Hammock RN Entered By: Rhae Hammock on 07/02/2021 10:46:09 -------------------------------------------------------------------------------- Compression Therapy Details Patient Name: Date of Service: Katrina Amel D. 07/02/2021 10:15 A M Medical Record Number: [id number] Patient Account Number: [id number] Date of Birth/Sex: Treating RN: 08/06/1932 (87 y.o. [name], Lauren Primary Care Teoman Giraud: [name] Other Clinician: Referring Jerett Odonohue: Treating Trust Crago/Extender: [name] in Treatment: 1 Compression Therapy Performed for Wound Assessment: Wound #11 Right,Anterior Lower Leg Performed By: Clinician Rhae Hammock, RN Compression Type: Three Layer Post Procedure Diagnosis Same as Pre-procedure Electronic Signature(s) Signed: 07/03/2021 12:27:53 PM By: Rhae Hammock RN Entered By: Rhae Hammock on 07/02/2021 10:46:09 -------------------------------------------------------------------------------- Compression Therapy Details Patient Name: Date of Service: Katrina Amel D. 07/02/2021 10:15 A M Medical Record Number: [id number] Patient Account Number: [id number] Date of Birth/Sex: Treating RN: February 08, 1932 (87 y.o. [name], Lauren Primary Care Jontavious Commons: [name] Other Clinician: Referring Tom Macpherson: Treating Delena Casebeer/Extender: [name] in Treatment: 1 Compression Therapy Performed for Wound Assessment: Wound #12 Right,Lateral Lower Leg Performed By: Clinician Rhae Hammock, RN Compression Type: Three Layer Post Procedure Diagnosis Same as Pre-procedure Electronic Signature(s) Signed: 07/03/2021  12:27:53 PM By: Rhae Hammock RN Entered By: Rhae Hammock on 07/02/2021 10:46:10 -------------------------------------------------------------------------------- Encounter Discharge Information Details Patient Name: Date of Service: Katrina Amel D. 07/02/2021 10:15 A M Medical Record Number: [id number] Patient Account Number: [id number] Date of Birth/Sex: Treating RN: Sep 08, 1931 (87 y.o. [name], Lauren Primary Care Alma Mohiuddin: [name] Other Clinician: Referring Ryin Schillo: Treating Infant Zink/Extender: [name] in Treatment: 1 Encounter Discharge Information Items Discharge Condition: Stable Ambulatory Status: Cane Discharge Destination: Home Transportation: Private Auto Accompanied By: self Schedule Follow-up Appointment: Yes Clinical Summary of Care: Patient Declined Electronic Signature(s) Signed: 07/03/2021 12:27:53 PM By: Rhae Hammock RN Entered By: Hollie Salk  Lauren on 07/02/2021 10:46:56 -------------------------------------------------------------------------------- Lower Extremity Assessment Details Patient Name: Date of Service: Katrina [name], Katrina D. 07/02/2021 10:15 A M Medical Record Number: [id number] Patient Account Number: [id number] Date of Birth/Sex: Treating RN: 09-Feb-1932 (87 y.o. [name], Lauren Primary Care Orpha Dain: [name] Other Clinician: Referring Ladean Steinmeyer: Treating Ernesteen Mihalic/Extender: [name] in Treatment: 1 Edema Assessment Assessed: Shirlyn Goltz: No] Patrice Paradise: No] E[Left: dema] [Right: :] Calf Left: Right: Point of Measurement: 29 cm From Medial Instep 44.5 cm 44.5 cm Ankle Left: Right: Point of Measurement: 11 cm From Medial Instep 25.5 cm 25.5 cm Knee To Floor Left: Right: From Medial Instep 37 cm 38 cm Vascular Assessment Pulses: Dorsalis Pedis Palpable: [Left:Yes] [Right:Yes] Doppler Audible: [Left:Yes] Posterior Tibial Palpable: [Left:Yes]  [Right:Yes] Electronic Signature(s) Signed: 07/03/2021 12:27:53 PM By: Rhae Hammock RN Entered By: Rhae Hammock on 07/02/2021 10:20:40 -------------------------------------------------------------------------------- Multi Wound Chart Details Patient Name: Date of Service: Katrina Amel D. 07/02/2021 10:15 A M Medical Record Number: [id number] Patient Account Number: [id number] Date of Birth/Sex: Treating RN: 13-Oct-1931 (87 y.o. [name], Meta.Reding Primary Care Francely Craw: [name] Other Clinician: Referring Sandie Swayze: Treating Jamari Moten/Extender: [name] in Treatment: 1 Vital Signs Height(in): 77 Pulse(bpm): 56 Weight(lbs): 221 Blood Pressure(mmHg): 160/74 Body Mass Index(BMI): 42 Temperature(F): 98.6 Respiratory Rate(breaths/min): 17 Photos: Left, Lateral Lower Leg Right, Anterior Lower Leg Right, Lateral Lower Leg Wound Location: Gradually Appeared Gradually Appeared Gradually Appeared Wounding Event: Venous Leg Ulcer Venous Leg Ulcer Venous Leg Ulcer Primary Etiology: Sleep Apnea, Arrhythmia, Sleep Apnea, Arrhythmia, Sleep Apnea, Arrhythmia, Comorbid History: Hypertension, Phlebitis Hypertension, Phlebitis Hypertension, Phlebitis 10/21/2020 10/21/2020 10/21/2020 Date Acquired: 1 1 1  Weeks of Treatment: Open Open Open Wound Status: No No No Clustered Wound: N/A N/A N/A Clustered Quantity: 0.1x0.1x0.1 0.1x0.1x0.1 0.1x0.1x0.1 Measurements L x W x D (cm) 0.008 0.008 0.008 A (cm) : rea 0.001 0.001 0.001 Volume (cm) : 100.00% 99.90% 100.00% % Reduction in Area: 100.00% 99.90% 100.00% % Reduction in Volume: Full Thickness Without Exposed Full Thickness Without Exposed Full Thickness Without Exposed Classification: Support Structures Support Structures Support Structures Medium Medium Large Exudate Amount: Serous Serous Serous Exudate Type: Geophysical data processor Exudate Color: Flat and Intact Flat and Intact Flat and  Intact Wound Margin: Large (67-100%) Large (67-100%) Large (67-100%) Granulation Amount: Pink Pink Pink Granulation Quality: None Present (0%) None Present (0%) None Present (0%) Necrotic Amount: Fat Layer (Subcutaneous Tissue): Yes Fat Layer (Subcutaneous Tissue): Yes Fat Layer (Subcutaneous Tissue): Yes Exposed Structures: Fascia: No Fascia: No Fascia: No Tendon: No Tendon: No Tendon: No Muscle: No Muscle: No Muscle: No Joint: No Joint: No Joint: No Bone: No Bone: No Bone: No None None None Epithelialization: Compression Therapy Compression Therapy Compression Therapy Procedures Performed: Wound Number: 13 N/A N/A Photos: N/A N/A Left Gluteus N/A N/A Wound Location: Gradually Appeared N/A N/A Wounding Event: Pressure Ulcer N/A N/A Primary Etiology: Sleep Apnea, Arrhythmia, N/A N/A Comorbid History: Hypertension, Phlebitis 10/21/2020 N/A N/A Date Acquired: 1 N/A N/A Weeks of Treatment: Open N/A N/A Wound Status: Yes N/A N/A Clustered Wound: 2 N/A N/A Clustered Quantity: 1x0.5x0.1 N/A N/A Measurements L x W x D (cm) 0.393 N/A N/A A (cm) : rea 0.039 N/A N/A Volume (cm) : 0.00% N/A N/A % Reduction in A rea: 0.00% N/A N/A % Reduction in Volume: Category/Stage II N/A N/A Classification: Medium N/A N/A Exudate A mount: Serosanguineous N/A N/A Exudate Type: red, brown N/A N/A Exudate Color: Flat and Intact N/A N/A Wound Margin: Large (67-100%) N/A N/A Granulation Amount:  Red N/A N/A Granulation Quality: None Present (0%) N/A N/A Necrotic Amount: Fat Layer (Subcutaneous Tissue): Yes N/A N/A Exposed Structures: Fascia: No Tendon: No Muscle: No Joint: No Bone: No None N/A N/A Epithelialization: N/A N/A N/A Procedures Performed: Treatment Notes Wound #10 (Lower Leg) Wound Laterality: Left, Lateral Cleanser Soap and Water Discharge Instruction: May shower and wash wound with dial antibacterial soap and water prior to dressing  change. Wound Cleanser Discharge Instruction: Cleanse the wound with wound cleanser prior to applying a clean dressing using gauze sponges, not tissue or cotton balls. Peri-Wound Care Triamcinolone 15 (g) Discharge Instruction: Use triamcinolone 15 (g) as directed Sween Lotion (Moisturizing lotion) Discharge Instruction: Apply moisturizing lotion as directed Topical Primary Dressing Secondary Dressing Secured With Compression Wrap ThreePress (3 layer compression wrap) Discharge Instruction: Apply three layer compression as directed. Compression Stockings Circaid Juxta Lite Compression Wrap Quantity: 1 Left Leg Compression Amount: 30-40 mmHg Discharge Instruction: Apply Circaid Juxta Lite Compression Wrap daily as instructed. Apply first thing in the morning, remove at night before bed. Add-Ons Wound #11 (Lower Leg) Wound Laterality: Right, Anterior Cleanser Soap and Water Discharge Instruction: May shower and wash wound with dial antibacterial soap and water prior to dressing change. Wound Cleanser Discharge Instruction: Cleanse the wound with wound cleanser prior to applying a clean dressing using gauze sponges, not tissue or cotton balls. Peri-Wound Care Triamcinolone 15 (g) Discharge Instruction: Use triamcinolone 15 (g) as directed Sween Lotion (Moisturizing lotion) Discharge Instruction: Apply moisturizing lotion as directed Topical Primary Dressing Secondary Dressing Secured With Compression Wrap ThreePress (3 layer compression wrap) Discharge Instruction: Apply three layer compression as directed. Compression Stockings Circaid Juxta Lite Compression Wrap Quantity: 1 Right Leg Compression Amount: 30-40 mmHg Discharge Instruction: Apply Circaid Juxta Lite Compression Wrap daily as instructed. Apply first thing in the morning, remove at night before bed. Add-Ons Wound #12 (Lower Leg) Wound Laterality: Right, Lateral Cleanser Soap and Water Discharge Instruction:  May shower and wash wound with dial antibacterial soap and water prior to dressing change. Wound Cleanser Discharge Instruction: Cleanse the wound with wound cleanser prior to applying a clean dressing using gauze sponges, not tissue or cotton balls. Peri-Wound Care Triamcinolone 15 (g) Discharge Instruction: Use triamcinolone 15 (g) as directed Sween Lotion (Moisturizing lotion) Discharge Instruction: Apply moisturizing lotion as directed Topical Primary Dressing Secondary Dressing Secured With Compression Wrap ThreePress (3 layer compression wrap) Discharge Instruction: Apply three layer compression as directed. Compression Stockings Add-Ons Wound #13 (Gluteus) Wound Laterality: Left Cleanser Wound Cleanser Discharge Instruction: Cleanse the wound with wound cleanser prior to applying a clean dressing using gauze sponges, not tissue or cotton balls. Peri-Wound Care Skin Prep Discharge Instruction: Use skin prep as directed Topical Primary Dressing KerraCel Ag Gelling Fiber Dressing, 2x2 in (silver alginate) Discharge Instruction: Apply silver alginate to wound bed as instructed Secondary Dressing Bordered Gauze, 4x4 in Discharge Instruction: Apply over primary dressing as directed. Secured With Compression Wrap Compression Stockings Environmental education officer) Signed: 07/02/2021 5:10:51 PM By: Linton Ham MD Signed: 07/02/2021 5:16:08 PM By: Deon Pilling RN, BSN Entered By: Linton Ham on 07/02/2021 10:59:04 -------------------------------------------------------------------------------- Multi-Disciplinary Care Plan Details Patient Name: Date of Service: Katrina Amel D. 07/02/2021 10:15 A M Medical Record Number: [id number] Patient Account Number: [id number] Date of Birth/Sex: Treating RN: 09/13/1931 (87 y.o. [name], Lauren Primary Care Joshawn Crissman: [name] Other Clinician: Referring Taimur Fier: Treating Myesha Stillion/Extender: [name] in Treatment: 1 Multidisciplinary Care Plan reviewed with physician Active Inactive Abuse / Safety /  Falls / Self Care Management Nursing Diagnoses: Potential for falls Potential for injury related to falls Goals: Patient will remain injury free related to falls Date Initiated: 06/25/2021 Target Resolution Date: 07/24/2021 Goal Status: Active Patient/caregiver will verbalize/demonstrate measures taken to prevent injury and/or falls Date Initiated: 06/25/2021 Target Resolution Date: 07/24/2021 Goal Status: Active Interventions: Assess Activities of Daily Living upon admission and as needed Assess fall risk on admission and as needed Assess: immobility, friction, shearing, incontinence upon admission and as needed Assess impairment of mobility on admission and as needed per policy Assess personal safety and home safety (as indicated) on admission and as needed Assess self care needs on admission and as needed Provide education on fall prevention Provide education on personal and home safety Notes: Wound/Skin Impairment Nursing Diagnoses: Impaired tissue integrity Knowledge deficit related to ulceration/compromised skin integrity Goals: Patient/caregiver will verbalize understanding of skin care regimen Date Initiated: 06/25/2021 Target Resolution Date: 07/24/2021 Goal Status: Active Interventions: Assess patient/caregiver ability to obtain necessary supplies Assess patient/caregiver ability to perform ulcer/skin care regimen upon admission and as needed Assess ulceration(s) every visit Provide education on ulcer and skin care Notes: Electronic Signature(s) Signed: 07/03/2021 12:27:53 PM By: Rhae Hammock RN Entered By: Rhae Hammock on 07/02/2021 10:45:15 -------------------------------------------------------------------------------- Pain Assessment Details Patient Name: Date of Service: Katrina Amel D. 07/02/2021 10:15 A M Medical  Record Number: [id number] Patient Account Number: [id number] Date of Birth/Sex: Treating RN: 21-Mar-1932 (87 y.o. [name], Lauren Primary Care Vonnetta Akey: [name] Other Clinician: Referring Felicha Frayne: Treating Issachar Broady/Extender: [name] in Treatment: 1 Active Problems Location of Pain Severity and Description of Pain Patient Has Paino No Site Locations Pain Management and Medication Current Pain Management: Electronic Signature(s) Signed: 07/03/2021 12:27:53 PM By: Rhae Hammock RN Entered By: Rhae Hammock on 07/02/2021 10:21:01 -------------------------------------------------------------------------------- Patient/Caregiver Education Details Patient Name: Date of Service: Katrina Amel D. 11/10/2022andnbsp10:15 A M Medical Record Number: [id number] Patient Account Number: [id number] Date of Birth/Gender: Treating RN: [birth year] (87 y.o. [name], Lauren Primary Care Physician: [name] Other Clinician: Referring Physician: Treating Physician/Extender: [name] in Treatment: 1 Education Assessment Education Provided To: Patient Education Topics Provided Wound/Skin Impairment: Methods: Explain/Verbal Responses: State content correctly Electronic Signature(s) Signed: 07/03/2021 12:27:53 PM By: Rhae Hammock RN Entered By: Rhae Hammock on 07/02/2021 10:45:28 -------------------------------------------------------------------------------- Wound Assessment Details Patient Name: Date of Service: Katrina Amel D. 07/02/2021 10:15 A M Medical Record Number: [id number] Patient Account Number: [id number] Date of Birth/Sex: Treating RN: March 30, 1932 (87 y.o. [name], Lauren Primary Care Red Mandt: [name] Other Clinician: Referring Hopelynn Gartland: Treating Quavion Boule/Extender: [name] in Treatment: 1 Wound Status Wound Number: 10 Primary  Etiology: Venous Leg Ulcer Wound Location: Left, Lateral Lower Leg Wound Status: Open Wounding Event: Gradually Appeared Comorbid History: Sleep Apnea, Arrhythmia, Hypertension, Phlebitis Date Acquired: 10/21/2020 Weeks Of Treatment: 1 Clustered Wound: No Photos Wound Measurements Length: (cm) 0.1 Width: (cm) 0.1 Depth: (cm) 0.1 Area: (cm) 0.008 Volume: (cm) 0.001 % Reduction in Area: 100% % Reduction in Volume: 100% Epithelialization: None Wound Description Classification: Full Thickness Without Exposed Support Structures Wound Margin: Flat and Intact Exudate Amount: Medium Exudate Type: Serous Exudate Color: amber Foul Odor After Cleansing: No Slough/Fibrino No Wound Bed Granulation Amount: Large (67-100%) Exposed Structure Granulation Quality: Pink Fascia Exposed: No Necrotic Amount: None Present (0%) Fat Layer (Subcutaneous Tissue) Exposed: Yes Tendon Exposed: No Muscle Exposed: No Joint Exposed: No Bone Exposed: No Treatment Notes Wound #10 (Lower Leg) Wound Laterality: Left, Lateral  Cleanser Soap and Water Discharge Instruction: May shower and wash wound with dial antibacterial soap and water prior to dressing change. Wound Cleanser Discharge Instruction: Cleanse the wound with wound cleanser prior to applying a clean dressing using gauze sponges, not tissue or cotton balls. Peri-Wound Care Triamcinolone 15 (g) Discharge Instruction: Use triamcinolone 15 (g) as directed Sween Lotion (Moisturizing lotion) Discharge Instruction: Apply moisturizing lotion as directed Topical Primary Dressing Secondary Dressing Secured With Compression Wrap ThreePress (3 layer compression wrap) Discharge Instruction: Apply three layer compression as directed. Compression Stockings Circaid Juxta Lite Compression Wrap Quantity: 1 Left Leg Compression Amount: 30-40 mmHg Discharge Instruction: Apply Circaid Juxta Lite Compression Wrap daily as instructed. Apply first thing in  the morning, remove at night before bed. Add-Ons Electronic Signature(s) Signed: 07/03/2021 12:27:53 PM By: Rhae Hammock RN Entered By: Rhae Hammock on 07/02/2021 10:41:55 -------------------------------------------------------------------------------- Wound Assessment Details Patient Name: Date of Service: Katrina Amel D. 07/02/2021 10:15 A M Medical Record Number: [id number] Patient Account Number: [id number] Date of Birth/Sex: Treating RN: September 09, 1931 (87 y.o. [name], Lauren Primary Care Ellayna Hilligoss: [name] Other Clinician: Referring Cleatis Fandrich: Treating Santanna Whitford/Extender: [name] in Treatment: 1 Wound Status Wound Number: 11 Primary Etiology: Venous Leg Ulcer Wound Location: Right, Anterior Lower Leg Wound Status: Open Wounding Event: Gradually Appeared Comorbid History: Sleep Apnea, Arrhythmia, Hypertension, Phlebitis Date Acquired: 10/21/2020 Weeks Of Treatment: 1 Clustered Wound: No Photos Wound Measurements Length: (cm) 0.1 Width: (cm) 0.1 Depth: (cm) 0.1 Area: (cm) 0.008 Volume: (cm) 0.001 Wound Description Classification: Full Thickness Without Exposed Support Structures Wound Margin: Flat and Intact Exudate Amount: Medium Exudate Type: Serous Exudate Color: amber Foul Odor After Cleansing: Slough/Fibrino % Reduction in Area: 99.9% % Reduction in Volume: 99.9% Epithelialization: None No No Wound Bed Granulation Amount: Large (67-100%) Exposed Structure Granulation Quality: Pink Fascia Exposed: No Necrotic Amount: None Present (0%) Fat Layer (Subcutaneous Tissue) Exposed: Yes Tendon Exposed: No Muscle Exposed: No Joint Exposed: No Bone Exposed: No Treatment Notes Wound #11 (Lower Leg) Wound Laterality: Right, Anterior Cleanser Soap and Water Discharge Instruction: May shower and wash wound with dial antibacterial soap and water prior to dressing change. Wound Cleanser Discharge Instruction:  Cleanse the wound with wound cleanser prior to applying a clean dressing using gauze sponges, not tissue or cotton balls. Peri-Wound Care Triamcinolone 15 (g) Discharge Instruction: Use triamcinolone 15 (g) as directed Sween Lotion (Moisturizing lotion) Discharge Instruction: Apply moisturizing lotion as directed Topical Primary Dressing Secondary Dressing Secured With Compression Wrap ThreePress (3 layer compression wrap) Discharge Instruction: Apply three layer compression as directed. Compression Stockings Circaid Juxta Lite Compression Wrap Quantity: 1 Right Leg Compression Amount: 30-40 mmHg Discharge Instruction: Apply Circaid Juxta Lite Compression Wrap daily as instructed. Apply first thing in the morning, remove at night before bed. Add-Ons Electronic Signature(s) Signed: 07/03/2021 12:27:53 PM By: Rhae Hammock RN Entered By: Rhae Hammock on 07/02/2021 10:41:55 -------------------------------------------------------------------------------- Wound Assessment Details Patient Name: Date of Service: Katrina Amel D. 07/02/2021 10:15 A M Medical Record Number: [id number] Patient Account Number: [id number] Date of Birth/Sex: Treating RN: 18-Apr-1932 (87 y.o. [name], Lauren Primary Care Adrieanna Boteler: [name] Other Clinician: Referring Trystan Eads: Treating Areta Terwilliger/Extender: [name] in Treatment: 1 Wound Status Wound Number: 12 Primary Etiology: Venous Leg Ulcer Wound Location: Right, Lateral Lower Leg Wound Status: Open Wounding Event: Gradually Appeared Comorbid History: Sleep Apnea, Arrhythmia, Hypertension, Phlebitis Date Acquired: 10/21/2020 Weeks Of Treatment: 1 Clustered Wound: No Photos Wound Measurements Length: (cm) 0.1 Width: (cm) 0.1  Depth: (cm) 0.1 Area: (cm) 0.008 Volume: (cm) 0.001 % Reduction in Area: 100% % Reduction in Volume: 100% Epithelialization: None Wound Description Classification: Full  Thickness Without Exposed Support Structures Wound Margin: Flat and Intact Exudate Amount: Large Exudate Type: Serous Exudate Color: amber Foul Odor After Cleansing: No Slough/Fibrino No Wound Bed Granulation Amount: Large (67-100%) Exposed Structure Granulation Quality: Pink Fascia Exposed: No Necrotic Amount: None Present (0%) Fat Layer (Subcutaneous Tissue) Exposed: Yes Tendon Exposed: No Muscle Exposed: No Joint Exposed: No Bone Exposed: No Treatment Notes Wound #12 (Lower Leg) Wound Laterality: Right, Lateral Cleanser Soap and Water Discharge Instruction: May shower and wash wound with dial antibacterial soap and water prior to dressing change. Wound Cleanser Discharge Instruction: Cleanse the wound with wound cleanser prior to applying a clean dressing using gauze sponges, not tissue or cotton balls. Peri-Wound Care Triamcinolone 15 (g) Discharge Instruction: Use triamcinolone 15 (g) as directed Sween Lotion (Moisturizing lotion) Discharge Instruction: Apply moisturizing lotion as directed Topical Primary Dressing Secondary Dressing Secured With Compression Wrap ThreePress (3 layer compression wrap) Discharge Instruction: Apply three layer compression as directed. Compression Stockings Add-Ons Electronic Signature(s) Signed: 07/03/2021 12:27:53 PM By: Rhae Hammock RN Entered By: Rhae Hammock on 07/02/2021 10:41:55 -------------------------------------------------------------------------------- Wound Assessment Details Patient Name: Date of Service: Katrina Amel D. 07/02/2021 10:15 A M Medical Record Number: [id number] Patient Account Number: [id number] Date of Birth/Sex: Treating RN: 01-25-1932 (87 y.o. [name], Lauren Primary Care Chasty Randal: [name] Other Clinician: Referring Indalecio Malmstrom: Treating Tameaka Eichhorn/Extender: [name] in Treatment: 1 Wound Status Wound Number: 13 Primary Etiology: Pressure  Ulcer Wound Location: Left Gluteus Wound Status: Open Wounding Event: Gradually Appeared Comorbid History: Sleep Apnea, Arrhythmia, Hypertension, Phlebitis Date Acquired: 10/21/2020 Weeks Of Treatment: 1 Clustered Wound: Yes Photos Wound Measurements Length: (cm) 1 Width: (cm) 0.5 Depth: (cm) 0.1 Clustered Quantity: 2 Area: (cm) 0.393 Volume: (cm) 0.039 % Reduction in Area: 0% % Reduction in Volume: 0% Epithelialization: None Wound Description Classification: Category/Stage II Wound Margin: Flat and Intact Exudate Amount: Medium Exudate Type: Serosanguineous Exudate Color: red, brown Foul Odor After Cleansing: No Slough/Fibrino No Wound Bed Granulation Amount: Large (67-100%) Exposed Structure Granulation Quality: Red Fascia Exposed: No Necrotic Amount: None Present (0%) Fat Layer (Subcutaneous Tissue) Exposed: Yes Tendon Exposed: No Muscle Exposed: No Joint Exposed: No Bone Exposed: No Treatment Notes Wound #13 (Gluteus) Wound Laterality: Left Cleanser Wound Cleanser Discharge Instruction: Cleanse the wound with wound cleanser prior to applying a clean dressing using gauze sponges, not tissue or cotton balls. Peri-Wound Care Skin Prep Discharge Instruction: Use skin prep as directed Topical Primary Dressing KerraCel Ag Gelling Fiber Dressing, 2x2 in (silver alginate) Discharge Instruction: Apply silver alginate to wound bed as instructed Secondary Dressing Bordered Gauze, 4x4 in Discharge Instruction: Apply over primary dressing as directed. Secured With Compression Wrap Compression Stockings Environmental education officer) Signed: 07/02/2021 5:16:08 PM By: Deon Pilling RN, BSN Signed: 07/03/2021 12:27:53 PM By: Rhae Hammock RN Entered By: Deon Pilling on 07/02/2021 10:50:59 -------------------------------------------------------------------------------- Vitals Details Patient Name: Date of Service: Katrina Amel D. 07/02/2021 10:15 A M Medical  Record Number: [id number] Patient Account Number: [id number] Date of Birth/Sex: Treating RN: 14-Jul-1932 (87 y.o. [name], Lauren Primary Care Loc Feinstein: [name] Other Clinician: Referring Emara Lichter: Treating Lilliah Priego/Extender: [name] in Treatment: 1 Vital Signs Time Taken: 10:21 Temperature (F): 98.6 Height (in): 61 Pulse (bpm): 68 Weight (lbs): 221 Respiratory Rate (breaths/min): 17 Body Mass Index (BMI): 41.8 Blood Pressure (mmHg): 160/74 Reference Range:  80 - 120 mg / dl Electronic Signature(s) Signed: 07/03/2021 12:27:53 PM By: Rhae Hammock RN Entered By: Rhae Hammock on 07/02/2021 10:21:21

## 2021-07-09 ENCOUNTER — Encounter (HOSPITAL_BASED_OUTPATIENT_CLINIC_OR_DEPARTMENT_OTHER): Payer: Medicare Other | Admitting: Internal Medicine

## 2021-07-09 ENCOUNTER — Other Ambulatory Visit: Payer: Self-pay

## 2021-07-09 DIAGNOSIS — I87333 Chronic venous hypertension (idiopathic) with ulcer and inflammation of bilateral lower extremity: Secondary | ICD-10-CM | POA: Diagnosis not present

## 2021-07-09 NOTE — Progress Notes (Addendum)
Katrina [name], Katrina [name] ([id number]) Visit Report for 07/09/2021 Arrival Information Details Patient Name: Date of Service: Katrina [name], Katrina [name] 07/09/2021 12:45 PM Medical Record Number: [id number] Patient Account Number: [id number] Date of Birth/Sex: Treating RN: 1932/01/03 (87 y.o. [name] Primary Care Jackie Littlejohn: [name] Other Clinician: Referring Tyress Loden: Treating Taylon Louison/Extender: [name] in Treatment: 2 Visit Information History Since Last Visit Added or deleted any medications: No Patient Arrived: Katrina [name] Any new allergies or adverse reactions: No Arrival Time: 12:48 Had a fall or experienced change in No Accompanied By: self activities of daily living that may affect Transfer Assistance: Manual risk of falls: Patient Identification Verified: Yes Signs or symptoms of abuse/neglect since last visito No Secondary Verification Process Completed: Yes Hospitalized since last visit: No Patient Requires Transmission-Based Precautions: No Implantable device outside of the clinic excluding No Patient Has Alerts: Yes cellular tissue based products placed in the center Patient Alerts: Patient on Blood Thinner since last visit: Has Dressing in Place as Prescribed: Yes Has Compression in Place as Prescribed: Yes Pain Present Now: Yes Electronic Signature(s) Signed: 07/09/2021 5:41:09 PM By: Baruch Gouty RN, BSN Entered By: Baruch Gouty on 07/09/2021 12:57:01 -------------------------------------------------------------------------------- Compression Therapy Details Patient Name: Date of Service: Katrina Amel D. 07/09/2021 12:45 PM Medical Record Number: [id number] Patient Account Number: [id number] Date of Birth/Sex: Treating RN: [birth year] (87 y.o. [name] Primary Care Zyquan Crotty: [name] Other Clinician: Referring Marrah Vanevery: Treating Emunah Texidor/Extender: [name] in Treatment: 2 Compression  Therapy Performed for Wound Assessment: Wound #10 Left,Lateral Lower Leg Performed By: Clinician Baruch Gouty, RN Compression Type: Three Layer Post Procedure Diagnosis Same as Pre-procedure Electronic Signature(s) Signed: 07/09/2021 5:41:09 PM By: Baruch Gouty RN, BSN Entered By: Baruch Gouty on 07/09/2021 13:30:55 -------------------------------------------------------------------------------- Compression Therapy Details Patient Name: Date of Service: Katrina Amel D. 07/09/2021 12:45 PM Medical Record Number: [id number] Patient Account Number: [id number] Date of Birth/Sex: Treating RN: [birth year] (87 y.o. [name] Primary Care Eutimio Gharibian: [name] Other Clinician: Referring Ligaya Cormier: Treating Deserea Bordley/Extender: [name] in Treatment: 2 Compression Therapy Performed for Wound Assessment: Wound #11 Right,Anterior Lower Leg Performed By: Clinician Baruch Gouty, RN Compression Type: Three Layer Post Procedure Diagnosis Same as Pre-procedure Electronic Signature(s) Signed: 07/09/2021 5:41:09 PM By: Baruch Gouty RN, BSN Entered By: Baruch Gouty on 07/09/2021 13:30:55 -------------------------------------------------------------------------------- Encounter Discharge Information Details Patient Name: Date of Service: Katrina Amel D. 07/09/2021 12:45 PM Medical Record Number: [id number] Patient Account Number: [id number] Date of Birth/Sex: Treating RN: 09-Dec-1931 (87 y.o. [name] Primary Care Devinne Epstein: [name] Other Clinician: Referring Neko Boyajian: Treating Charlann Wayne/Extender: [name] in Treatment: 2 Encounter Discharge Information Items Discharge Condition: Stable Ambulatory Status: Ambulatory Discharge Destination: Home Transportation: Other Accompanied By: self Schedule Follow-up Appointment: Yes Clinical Summary of Care: Patient Declined Notes facility  transportation Electronic Signature(s) Signed: 07/09/2021 5:41:09 PM By: Baruch Gouty RN, BSN Entered By: Baruch Gouty on 07/09/2021 17:26:12 -------------------------------------------------------------------------------- Lower Extremity Assessment Details Patient Name: Date of Service: Katrina Amel D. 07/09/2021 12:45 PM Medical Record Number: [id number] Patient Account Number: [id number] Date of Birth/Sex: Treating RN: [birth year] (87 y.o. [name] Primary Care Katheryn Culliton: [name] Other Clinician: Referring Angellica Maddison: Treating Gillermo Poch/Extender: [name] in Treatment: 2 Edema Assessment Assessed: [Left: No] [Right: No] E[Left: dema] [Right: :] Calf Left: Right: Point of Measurement: 29 cm From Medial Instep 51.5 cm 49.5 cm Ankle Left: Right: Point of Measurement: 11 cm From Medial Instep 24  cm 23.8 cm Knee To Floor Left: Right: From Medial Instep 36 cm 36 cm Vascular Assessment Pulses: Dorsalis Pedis Palpable: [Left:Yes] [Right:Yes] Electronic Signature(s) Signed: 07/13/2021 4:18:59 PM By: Baruch Gouty RN, BSN Previous Signature: 07/09/2021 5:41:09 PM Version By: Baruch Gouty RN, BSN Entered By: Baruch Gouty on 07/13/2021 11:32:31 -------------------------------------------------------------------------------- Multi Wound Chart Details Patient Name: Date of Service: Katrina Amel D. 07/09/2021 12:45 PM Medical Record Number: [id number] Patient Account Number: [id number] Date of Birth/Sex: Treating RN: Nov 23, 1931 (87 y.o. [name], Meta.Reding Primary Care Uthman Mroczkowski: [name] Other Clinician: Referring Janai Maudlin: Treating Paizley Ramella/Extender: [name] in Treatment: 2 Vital Signs Height(in): 61 Pulse(bpm): 75 Weight(lbs): 221 Blood Pressure(mmHg): 167/67 Body Mass Index(BMI): 42 Temperature(F): 98.3 Respiratory Rate(breaths/min): 20 Photos: Left, Lateral Lower Leg Right,  Anterior Lower Leg Right, Lateral Lower Leg Wound Location: Gradually Appeared Gradually Appeared Gradually Appeared Wounding Event: Venous Leg Ulcer Venous Leg Ulcer Venous Leg Ulcer Primary Etiology: Sleep Apnea, Arrhythmia, Sleep Apnea, Arrhythmia, Sleep Apnea, Arrhythmia, Comorbid History: Hypertension, Phlebitis Hypertension, Phlebitis Hypertension, Phlebitis 10/21/2020 10/21/2020 10/21/2020 Date Acquired: 2 2 2  Weeks of Treatment: Open Open Open Wound Status: No No No Clustered Wound: N/A N/A N/A Clustered Quantity: 0.1x0.1x0.1 0.1x0.1x0.1 0.1x0.1x0.1 Measurements L x W x D (cm) 0.008 0.008 0.008 A (cm) : rea 0.001 0.001 0.001 Volume (cm) : 100.00% 99.90% 100.00% % Reduction in Area: 100.00% 99.90% 100.00% % Reduction in Volume: Full Thickness Without Exposed Full Thickness Without Exposed Full Thickness Without Exposed Classification: Support Structures Support Structures Support Structures None Present Small Small Exudate Amount: N/A Serous Serous Exudate Type: N/A amber amber Exudate Color: Flat and Intact Indistinct, nonvisible Flat and Intact Wound Margin: None Present (0%) None Present (0%) Small (1-33%) Granulation Amount: N/A N/A Pink Granulation Quality: None Present (0%) None Present (0%) None Present (0%) Necrotic Amount: Fascia: No Fascia: No Fat Layer (Subcutaneous Tissue): Yes Exposed Structures: Fat Layer (Subcutaneous Tissue): No Fat Layer (Subcutaneous Tissue): No Fascia: No Tendon: No Tendon: No Tendon: No Muscle: No Muscle: No Muscle: No Joint: No Joint: No Joint: No Bone: No Bone: No Bone: No Limited to Skin Breakdown Large (67-100%) Large (67-100%) Large (67-100%) Epithelialization: Compression Therapy Compression Therapy N/A Procedures Performed: Wound Number: 13 N/A N/A Photos: N/A N/A Left Gluteus N/A N/A Wound Location: Gradually Appeared N/A N/A Wounding Event: Pressure Ulcer N/A N/A Primary Etiology: Sleep  Apnea, Arrhythmia, N/A N/A Comorbid History: Hypertension, Phlebitis 10/21/2020 N/A N/A Date Acquired: 2 N/A N/A Weeks of Treatment: Open N/A N/A Wound Status: Yes N/A N/A Clustered Wound: 2 N/A N/A Clustered Quantity: 0.5x0.5x0.1 N/A N/A Measurements L x W x D (cm) 0.196 N/A N/A A (cm) : rea 0.02 N/A N/A Volume (cm) : 50.10% N/A N/A % Reduction in A rea: 48.70% N/A N/A % Reduction in Volume: Category/Stage II N/A N/A Classification: Medium N/A N/A Exudate A mount: Serosanguineous N/A N/A Exudate Type: red, brown N/A N/A Exudate Color: Flat and Intact N/A N/A Wound Margin: Large (67-100%) N/A N/A Granulation A mount: Red N/A N/A Granulation Quality: None Present (0%) N/A N/A Necrotic A mount: Fat Layer (Subcutaneous Tissue): Yes N/A N/A Exposed Structures: Fascia: No Tendon: No Muscle: No Joint: No Bone: No None N/A N/A Epithelialization: N/A N/A N/A Procedures Performed: Treatment Notes Electronic Signature(s) Signed: 07/09/2021 5:16:34 PM By: Linton Ham MD Signed: 07/09/2021 5:18:51 PM By: Deon Pilling RN, BSN Entered By: Linton Ham on 07/09/2021 13:37:53 -------------------------------------------------------------------------------- Multi-Disciplinary Care Plan Details Patient Name: Date of Service: Katrina Amel D. 07/09/2021 12:45 PM  Medical Record Number: [id number] Patient Account Number: [id number] Date of Birth/Sex: Treating RN: 06/16/1932 (87 y.o. [name] Primary Care Arber Wiemers: [name] Other Clinician: Referring Slyvester Latona: Treating Vinetta Brach/Extender: [name] in Treatment: 2 Multidisciplinary Care Plan reviewed with physician Active Inactive Abuse / Safety / Falls / Self Care Management Nursing Diagnoses: Potential for falls Potential for injury related to falls Goals: Patient will remain injury free related to falls Date Initiated: 06/25/2021 Target Resolution Date:  07/24/2021 Goal Status: Active Patient/caregiver will verbalize/demonstrate measures taken to prevent injury and/or falls Date Initiated: 06/25/2021 Target Resolution Date: 07/24/2021 Goal Status: Active Interventions: Assess Activities of Daily Living upon admission and as needed Assess fall risk on admission and as needed Assess: immobility, friction, shearing, incontinence upon admission and as needed Assess impairment of mobility on admission and as needed per policy Assess personal safety and home safety (as indicated) on admission and as needed Assess self care needs on admission and as needed Provide education on fall prevention Provide education on personal and home safety Notes: Wound/Skin Impairment Nursing Diagnoses: Impaired tissue integrity Knowledge deficit related to ulceration/compromised skin integrity Goals: Patient/caregiver will verbalize understanding of skin care regimen Date Initiated: 06/25/2021 Target Resolution Date: 07/24/2021 Goal Status: Active Interventions: Assess patient/caregiver ability to obtain necessary supplies Assess patient/caregiver ability to perform ulcer/skin care regimen upon admission and as needed Assess ulceration(s) every visit Provide education on ulcer and skin care Notes: Electronic Signature(s) Signed: 07/09/2021 5:41:09 PM By: Baruch Gouty RN, BSN Entered By: Baruch Gouty on 07/09/2021 13:24:03 -------------------------------------------------------------------------------- Pain Assessment Details Patient Name: Date of Service: Katrina Amel D. 07/09/2021 12:45 PM Medical Record Number: [id number] Patient Account Number: [id number] Date of Birth/Sex: Treating RN: 06/16/1932 (87 y.o. [name] Primary Care Kora Groom: [name] Other Clinician: Referring Tanazia Achee: Treating Girtrude Enslin/Extender: [name] in Treatment: 2 Active Problems Location of Pain Severity and Description  of Pain Patient Has Paino Yes Site Locations Pain Location: Pain in Ulcers With Dressing Change: No Duration of the Pain. Constant / Intermittento Intermittent Rate the pain. Current Pain Level: 0 Worst Pain Level: 4 Least Pain Level: 0 Character of Pain Describe the Pain: Aching, Tender Pain Management and Medication Current Pain Management: Other: reposition Is the Current Pain Management Adequate: Adequate How does your wound impact your activities of daily livingo Sleep: No Bathing: No Appetite: No Relationship With Others: No Bladder Continence: No Emotions: No Bowel Continence: No Work: No Toileting: No Drive: No Dressing: No Hobbies: No Electronic Signature(s) Signed: 07/09/2021 5:41:09 PM By: Baruch Gouty RN, BSN Entered By: Baruch Gouty on 07/09/2021 12:58:46 -------------------------------------------------------------------------------- Patient/Caregiver Education Details Patient Name: Date of Service: Katrina Amel D. 11/17/2022andnbsp12:45 PM Medical Record Number: [id number] Patient Account Number: [id number] Date of Birth/Gender: Treating RN: 29-Apr-1932 (87 y.o. [name] Primary Care Physician: [name] Other Clinician: Referring Physician: Treating Physician/Extender: [name] in Treatment: 2 Education Assessment Education Provided To: Patient Education Topics Provided Pressure: Methods: Explain/Verbal Responses: Reinforcements needed, State content correctly Venous: Methods: Explain/Verbal Responses: Reinforcements needed, State content correctly Wound/Skin Impairment: Methods: Explain/Verbal Responses: Reinforcements needed, State content correctly Electronic Signature(s) Signed: 07/09/2021 5:41:09 PM By: Baruch Gouty RN, BSN Entered By: Baruch Gouty on 07/09/2021 13:24:28 -------------------------------------------------------------------------------- Wound Assessment  Details Patient Name: Date of Service: Katrina Amel D. 07/09/2021 12:45 PM Medical Record Number: [id number] Patient Account Number: [id number] Date of Birth/Sex: Treating RN: [birth year] (87 y.o. [name] Primary Care Eriyah Fernando: [name] Other Clinician: Referring  Tywana Robotham: Treating Alvy Alsop/Extender: [name] in Treatment: 2 Wound Status Wound Number: 10 Primary Etiology: Venous Leg Ulcer Wound Location: Left, Lateral Lower Leg Wound Status: Open Wounding Event: Gradually Appeared Comorbid History: Sleep Apnea, Arrhythmia, Hypertension, Phlebitis Date Acquired: 10/21/2020 Weeks Of Treatment: 2 Clustered Wound: No Photos Wound Measurements Length: (cm) 0.1 Width: (cm) 0.1 Depth: (cm) 0.1 Area: (cm) 0.008 Volume: (cm) 0.001 % Reduction in Area: 100% % Reduction in Volume: 100% Epithelialization: Large (67-100%) Tunneling: No Undermining: No Wound Description Classification: Full Thickness Without Exposed Support Structu Wound Margin: Flat and Intact Exudate Amount: None Present res Foul Odor After Cleansing: No Slough/Fibrino No Wound Bed Granulation Amount: None Present (0%) Exposed Structure Necrotic Amount: None Present (0%) Fascia Exposed: No Fat Layer (Subcutaneous Tissue) Exposed: No Tendon Exposed: No Muscle Exposed: No Joint Exposed: No Bone Exposed: No Treatment Notes Wound #10 (Lower Leg) Wound Laterality: Left, Lateral Cleanser Soap and Water Discharge Instruction: May shower and wash wound with dial antibacterial soap and water prior to dressing change. Wound Cleanser Discharge Instruction: Cleanse the wound with wound cleanser prior to applying a clean dressing using gauze sponges, not tissue or cotton balls. Peri-Wound Care Triamcinolone 15 (g) Discharge Instruction: Use triamcinolone .1%15 (g) mixed 50:50 with lotion to lower legs Sween Lotion (Moisturizing lotion) Discharge Instruction: Apply  moisturizing lotion as directed Topical Primary Dressing Secondary Dressing Secured With Compression Wrap ThreePress (3 layer compression wrap) Discharge Instruction: Apply three layer compression. may use unna layer at top top secure Compression Stockings Circaid Juxta Lite Compression Wrap Quantity: 1 Left Leg Compression Amount: 30-40 mmHg Discharge Instruction: Apply Circaid Juxta Lite Compression Wrap daily as instructed. Apply first thing in the morning, remove at night before bed. Add-Ons Electronic Signature(s) Signed: 07/09/2021 5:41:09 PM By: Baruch Gouty RN, BSN Entered By: Baruch Gouty on 07/09/2021 13:17:19 -------------------------------------------------------------------------------- Wound Assessment Details Patient Name: Date of Service: Katrina Amel D. 07/09/2021 12:45 PM Medical Record Number: [id number] Patient Account Number: [id number] Date of Birth/Sex: Treating RN: [birth year] (87 y.o. [name] Primary Care Dawan Farney: [name] Other Clinician: Referring Kaely Hollan: Treating Izzy Doubek/Extender: [name] in Treatment: 2 Wound Status Wound Number: 11 Primary Etiology: Venous Leg Ulcer Wound Location: Right, Anterior Lower Leg Wound Status: Open Wounding Event: Gradually Appeared Comorbid History: Sleep Apnea, Arrhythmia, Hypertension, Phlebitis Date Acquired: 10/21/2020 Weeks Of Treatment: 2 Clustered Wound: No Photos Wound Measurements Length: (cm) 0.1 Width: (cm) 0.1 Depth: (cm) 0.1 Area: (cm) 0.008 Volume: (cm) 0.001 % Reduction in Area: 99.9% % Reduction in Volume: 99.9% Epithelialization: Large (67-100%) Tunneling: No Undermining: No Wound Description Classification: Full Thickness Without Exposed Support Structures Wound Margin: Indistinct, nonvisible Exudate Amount: Small Exudate Type: Serous Exudate Color: amber Foul Odor After Cleansing: No Slough/Fibrino No Wound  Bed Granulation Amount: None Present (0%) Exposed Structure Necrotic Amount: None Present (0%) Fascia Exposed: No Fat Layer (Subcutaneous Tissue) Exposed: No Tendon Exposed: No Muscle Exposed: No Joint Exposed: No Bone Exposed: No Limited to Skin Breakdown Treatment Notes Wound #11 (Lower Leg) Wound Laterality: Right, Anterior Cleanser Soap and Water Discharge Instruction: May shower and wash wound with dial antibacterial soap and water prior to dressing change. Wound Cleanser Discharge Instruction: Cleanse the wound with wound cleanser prior to applying a clean dressing using gauze sponges, not tissue or cotton balls. Peri-Wound Care Triamcinolone 15 (g) Discharge Instruction: Use triamcinolone .1%15 (g) mixed 50:50 with lotion to lower legs Sween Lotion (Moisturizing lotion) Discharge Instruction: Apply moisturizing lotion as directed Topical Primary  Dressing Secondary Dressing Secured With Compression Wrap ThreePress (3 layer compression wrap) Discharge Instruction: Apply three layer compression. may use unna layer at top top secure Compression Stockings Add-Ons Electronic Signature(s) Signed: 07/09/2021 5:41:09 PM By: Baruch Gouty RN, BSN Entered By: Baruch Gouty on 07/09/2021 13:14:50 -------------------------------------------------------------------------------- Wound Assessment Details Patient Name: Date of Service: Katrina Amel D. 07/09/2021 12:45 PM Medical Record Number: [id number] Patient Account Number: [id number] Date of Birth/Sex: Treating RN: 08-Aug-1932 (87 y.o. [name] Primary Care Ryshawn Sanzone: [name] Other Clinician: Referring Jeryn Cerney: Treating Ronnell Clinger/Extender: [name] in Treatment: 2 Wound Status Wound Number: 12 Primary Etiology: Venous Leg Ulcer Wound Location: Right, Lateral Lower Leg Wound Status: Open Wounding Event: Gradually Appeared Comorbid History: Sleep Apnea, Arrhythmia,  Hypertension, Phlebitis Date Acquired: 10/21/2020 Weeks Of Treatment: 2 Clustered Wound: No Photos Wound Measurements Length: (cm) 0.1 Width: (cm) 0.1 Depth: (cm) 0.1 Area: (cm) 0.008 Volume: (cm) 0.001 % Reduction in Area: 100% % Reduction in Volume: 100% Epithelialization: Large (67-100%) Tunneling: No Undermining: No Wound Description Classification: Full Thickness Without Exposed Support Structures Wound Margin: Flat and Intact Exudate Amount: Small Exudate Type: Serous Exudate Color: amber Foul Odor After Cleansing: No Slough/Fibrino No Wound Bed Granulation Amount: Small (1-33%) Exposed Structure Granulation Quality: Pink Fascia Exposed: No Necrotic Amount: None Present (0%) Fat Layer (Subcutaneous Tissue) Exposed: Yes Tendon Exposed: No Muscle Exposed: No Joint Exposed: No Bone Exposed: No Treatment Notes Wound #12 (Lower Leg) Wound Laterality: Right, Lateral Cleanser Soap and Water Discharge Instruction: May shower and wash wound with dial antibacterial soap and water prior to dressing change. Wound Cleanser Discharge Instruction: Cleanse the wound with wound cleanser prior to applying a clean dressing using gauze sponges, not tissue or cotton balls. Peri-Wound Care Triamcinolone 15 (g) Discharge Instruction: Use triamcinolone .1%15 (g) mixed 50:50 with lotion to lower legs Sween Lotion (Moisturizing lotion) Discharge Instruction: Apply moisturizing lotion as directed Topical Primary Dressing Secondary Dressing Secured With Compression Wrap ThreePress (3 layer compression wrap) Discharge Instruction: Apply three layer compression. may use unna layer at top top secure Compression Stockings Add-Ons Electronic Signature(s) Signed: 07/09/2021 5:41:09 PM By: Baruch Gouty RN, BSN Entered By: Baruch Gouty on 07/09/2021 13:15:35 -------------------------------------------------------------------------------- Wound Assessment Details Patient  Name: Date of Service: Katrina Amel D. 07/09/2021 12:45 PM Medical Record Number: [id number] Patient Account Number: [id number] Date of Birth/Sex: Treating RN: 24-Aug-1931 (87 y.o. [name] Primary Care Nikkol Pai: [name] Other Clinician: Referring Tomasa Dobransky: Treating Marissa Weaver/Extender: [name] in Treatment: 2 Wound Status Wound Number: 13 Primary Etiology: Pressure Ulcer Wound Location: Left Gluteus Wound Status: Open Wounding Event: Gradually Appeared Comorbid History: Sleep Apnea, Arrhythmia, Hypertension, Phlebitis Date Acquired: 10/21/2020 Weeks Of Treatment: 2 Clustered Wound: Yes Photos Wound Measurements Length: (cm) 0.5 Width: (cm) 0.5 Depth: (cm) 0.1 Clustered Quantity: 2 Area: (cm) 0.1 Volume: (cm) 0.0 Wound Description Classification: Category/Stage II Wound Margin: Flat and Intact Exudate Amount: Medium Exudate Type: Serosanguineous Exudate Color: red, brown Foul Odor After Cleansing: Slough/Fibrino % Reduction in Area: 50.1% % Reduction in Volume: 48.7% Epithelialization: None 96 2 No No Wound Bed Granulation Amount: Large (67-100%) Exposed Structure Granulation Quality: Red Fascia Exposed: No Necrotic Amount: None Present (0%) Fat Layer (Subcutaneous Tissue) Exposed: Yes Tendon Exposed: No Muscle Exposed: No Joint Exposed: No Bone Exposed: No Treatment Notes Wound #13 (Gluteus) Wound Laterality: Left Cleanser Wound Cleanser Discharge Instruction: Cleanse the wound with wound cleanser prior to applying a clean dressing using gauze sponges, not tissue or cotton  balls. Peri-Wound Care Skin Prep Discharge Instruction: Use skin prep as directed Topical Primary Dressing KerraCel Ag Gelling Fiber Dressing, 2x2 in (silver alginate) Discharge Instruction: Apply silver alginate to wound bed as instructed Secondary Dressing Bordered Gauze, 4x4 in Discharge Instruction: Apply over primary dressing as  directed. Secured With Compression Wrap Compression Stockings Environmental education officer) Signed: 07/09/2021 5:41:09 PM By: Baruch Gouty RN, BSN Entered By: Baruch Gouty on 07/09/2021 13:22:09 -------------------------------------------------------------------------------- [place] Details Patient Name: Date of Service: Katrina Amel D. 07/09/2021 12:45 PM Medical Record Number: [id number] Patient Account Number: [id number] Date of Birth/Sex: Treating RN: [birth year] (87 y.o. [name], [name] Primary Care Benicia Bergevin: [name] Other Clinician: Referring Annalis Kaczmarczyk: Treating Nidia Grogan/Extender: [name] in Treatment: 2 Vital Signs Time Taken: 12:57 Temperature (F): 98.3 Height (in): 61 Pulse (bpm): 67 Source: Stated Respiratory Rate (breaths/min): 20 Weight (lbs): 221 Blood Pressure (mmHg): 167/67 Source: Stated Reference Range: 80 - 120 mg / dl Body Mass Index (BMI): 41.8 Electronic Signature(s) Signed: 07/09/2021 5:41:09 PM By: Baruch Gouty RN, BSN Entered By: Baruch Gouty on 07/09/2021 12:57:26

## 2021-07-22 NOTE — Progress Notes (Signed)
Katrina, [name] ([id number]) Visit Report for 07/09/2021 HPI Details Patient Name: Date of Service: Katrina [name], Katrina D. 07/09/2021 12:45 PM Medical Record Number: [id number] Patient Account Number: [id number] Date of Birth/Sex: Treating RN: 19-Aug-1932 (87 y.o. Katrina [name] Primary Care Provider: [name] Other Clinician: Referring Provider: Treating Provider/Extender: [name] in Treatment: 2 History of Present Illness Location: right lower extremity Quality: Patient reports experiencing a dull pain to affected area(s). Severity: Patient states wound(s) are getting better. Duration: Patient has had the wound for 3 months prior to presenting for treatment Context: The wound would happen gradually Modifying Factors: Patient wound(s)/ulcer(s) are worsening due to :standing for a long while. HPI Description: Past medical history of pulmonary emboli, hypothyroidism, hyperlipidemia, hypertension, sleep apnea, gallstones, ulcerated lower extremities, atrial fibrillation, status post total knee replacement, cholecystectomy, cystoscopy and stent placement for kidney stones. She has never been a smoker. About a year ago she was seen by [name] with an ulcer of her right lower extremity which she's had for about over 3 months. On 10/23/14 -- Lower extremity venous duplex evaluation. She had no evidence of deep venous thromboses involving both lower extremities. There was evidence of deep vein reflux in the right and left common femoral veins. She also had evidence of great saphenous vein reflux noted only at the level of the saphenofemoral junction. No evidence of greater or small saphenous vein reflux. She will need a consult to the vascular surgeon. Seen by Dr. Mathis Fare office note dated 11/13/2014. He noted that she had good arterial blood flow with palpable DP pulses bilaterally. Based on the patient's history Dr. Scot Dock recommended elevation when at rest of both feet  and knees and ankles above heart level in the supine position. Activity such as walking and pool exercises were recommended. She will also have 15-20 mm thigh-high compression stockings daily once the ulcers have completely healed. A prescription was given to her. 11/04/2015 -- the wound on her right lower extremity has completely healed and her edema has gone down significantly. However in her popliteal fossa where she's got a lot of fungal infection there is an area which is now an open wound and this will need attention. 12/02/2015 -- the right lower extremity looks much better than the edema has gone down significantly however she continues to have significant edema on the left side today. 12/09/2015 -- the patient has been doing her dressings as advised and says that overall she feels much better. She has received her juxta light for the left lower extremity READMISSION 06/25/2021 This is a patient who is actually had several previous visits to our clinic in 2014, 2016 and more recently 2017. This was largely because of wounds on her lower legs in the setting of chronic venous insufficiency and secondary lymphedema. Looking at my records she was apparently prescribed thigh-high compression stockings probably by vein and vascular although she comes back in not having worn any stockings in quite a period of time. She is now in the assisted living part of [facility]. She thinks her wounds have been there since April. This includes the left lateral, right anterior and right posterior lateral parts of both lower legs. She also has a stage II pressure ulcer on her left glued I think which is happened because of sleeping in a lift chair. She had ABDs and kerlix on her bilateral lower legs. Past medical history reviewed she has hypothyroidism, left buttock pressure ulcer dating back to March  of this year, iron deficiency, falls, paroxysmal atrial fibrillation  anticoagulated and chronic venous insufficiency ABIs in our clinic were 0.86 on the right and 0.96 on the left 11/10; patient comes into clinic today after being admitted last week with uncontrolled lymphedema, severe venous insufficiency and some superficial wounds on her bilateral lower legs. We put her in 3 layer compression and she comes in today with all the wounds closed and considerable improvement in the condition of her lower legs. The patient is going to need lifelong compression stockings probably juxta lites and we are going to go ahead and order those for her today. Unfortunately the POA here is a niece in [place] were probably going to have to talk with her to arrange proper payment for the juxta lite stockings. She is in the assisted living level of [facility]. She does not feel she can get the stockings on independently I am hopeful that the nurse and aids at the assisted living can help her with this otherwise this may be a problem. She also has an area on her left buttock which is a stage II wound. A lot of unhealthy irritated skin around this I think from moisture prolonged sittingo Incontinence 11/17; the patient's area on the left buttock is just about closed although there is still a lot of unhealthy looking discolored skin around a large area here. I suspect this is chronic moisture, pressure, perhaps incontinence. she has minuscule open areas on her bilateral lower legs. Apparently our intermediary did not get the order for juxta lite stockings therefore she does not have any. Electronic Signature(s) Signed: 07/09/2021 5:16:34 PM By: Linton Ham MD Entered By: Linton Ham on 07/09/2021 13:39:12 -------------------------------------------------------------------------------- Physical Exam Details Patient Name: Date of Service: Katrina Amel D. 07/09/2021 12:45 PM Medical Record Number: [id number] Patient Account Number: [id number] Date of Birth/Sex:  Treating RN: Jan 17, 1932 (87 y.o.o. Katrina [name] Primary Care Provider: [name] Other Clinician: Referring Provider: Treating Provider/Extender: [name] in Treatment: 2 Constitutional Patient is hypertensive.. Pulse regular and within target range for patient.Marland Kitchen Respirations regular, non-labored and within target range.. Temperature is normal and within the target range for the patient.Marland Kitchen Appears in no distress. Notes Wound exam; most of the areas on her bilateral lower legs are healed. Still small pinpoint areas that are not completely closed. The degree of venous stasis/inflammation on her bilateral lower legs is a lot better with compression and TCA 0.1% cream. The area on the left buttock is down to subcentimeter levels Electronic Signature(s) Signed: 07/09/2021 5:16:34 PM By: Linton Ham MD Entered By: Linton Ham on 07/09/2021 13:40:08 -------------------------------------------------------------------------------- Physician Orders Details Patient Name: Date of Service: Katrina Amel D. 07/09/2021 12:45 PM Medical Record Number: [id number] Patient Account Number: [id number] Date of Birth/Sex: Treating RN: Jun 16, 1932 (87 y.o. [name] Primary Care Provider: [name] Other Clinician: Referring Provider: Treating Provider/Extender: [name] in Treatment: 2 Verbal / Phone Orders: No Diagnosis Coding ICD-10 Coding Code Description [phone] Chronic venous hypertension (idiopathic) with ulcer and inflammation of bilateral lower extremity I89.0 Lymphedema, not elsewhere classified L89.322 Pressure ulcer of left buttock, stage 2 L97.821 Non-pressure chronic ulcer of other part of left lower leg limited to breakdown of skin L97.811 Non-pressure chronic ulcer of other part of right lower leg limited to breakdown of skin Follow-up Appointments ppointment in 2 weeks. - with Dr. Dellia Nims Return  A Bathing/ Shower/ Hygiene May shower with protection but do not get wound  dressing(s) wet. - Ok to use cast protector Edema Control - Lymphedema / SCD / Other Elevate legs to the level of the heart or above for 30 minutes daily and/or when sitting, a frequency of: - throughout the day Avoid standing for long periods of time. Exercise regularly Moisturize legs daily. Compression stocking or Garment 20-30 mm/Hg pressure to: - may discontinue compression wraps and apply juxtalite compression garments daily to lower legs when available Wound Treatment Wound #10 - Lower Leg Wound Laterality: Left, Lateral Cleanser: Soap and Water 1 x Per Week/7 Days Discharge Instructions: May shower and wash wound with dial antibacterial soap and water prior to dressing change. Cleanser: Wound Cleanser 1 x Per Week/7 Days Discharge Instructions: Cleanse the wound with wound cleanser prior to applying a clean dressing using gauze sponges, not tissue or cotton balls. Peri-Wound Care: Triamcinolone 15 (g) 1 x Per Week/7 Days Discharge Instructions: Use triamcinolone .1%15 (g) mixed 50:50 with lotion to lower legs Peri-Wound Care: Sween Lotion (Moisturizing lotion) 1 x Per Week/7 Days Discharge Instructions: Apply moisturizing lotion as directed Compression Wrap: ThreePress (3 layer compression wrap) 1 x Per Week/7 Days Discharge Instructions: Apply three layer compression. may use unna layer at top top secure Compression Stockings: Circaid Juxta Lite Compression Wrap (DME) Left Leg Compression Amount: 30-40 mmHG Discharge Instructions: Apply Circaid Juxta Lite Compression Wrap daily as instructed. Apply first thing in the morning, remove at night before bed. Wound #11 - Lower Leg Wound Laterality: Right, Anterior Cleanser: Soap and Water 1 x Per Week/7 Days Discharge Instructions: May shower and wash wound with dial antibacterial soap and water prior to dressing change. Cleanser: Wound Cleanser 1 x Per Week/7  Days Discharge Instructions: Cleanse the wound with wound cleanser prior to applying a clean dressing using gauze sponges, not tissue or cotton balls. Peri-Wound Care: Triamcinolone 15 (g) 1 x Per Week/7 Days Discharge Instructions: Use triamcinolone .1%15 (g) mixed 50:50 with lotion to lower legs Peri-Wound Care: Sween Lotion (Moisturizing lotion) 1 x Per Week/7 Days Discharge Instructions: Apply moisturizing lotion as directed Compression Wrap: ThreePress (3 layer compression wrap) 1 x Per Week/7 Days Discharge Instructions: Apply three layer compression. may use unna layer at top top secure Compression Stockings: Circaid Juxta Lite Compression Wrap (DME) Right Leg Compression Amount: 30-40 mmHG Discharge Instructions: Apply Circaid Juxta Lite Compression Wrap daily as instructed. Apply first thing in the morning, remove at night before bed. Wound #12 - Lower Leg Wound Laterality: Right, Lateral Cleanser: Soap and Water 1 x Per Week/7 Days Discharge Instructions: May shower and wash wound with dial antibacterial soap and water prior to dressing change. Cleanser: Wound Cleanser 1 x Per Week/7 Days Discharge Instructions: Cleanse the wound with wound cleanser prior to applying a clean dressing using gauze sponges, not tissue or cotton balls. Peri-Wound Care: Triamcinolone 15 (g) 1 x Per Week/7 Days Discharge Instructions: Use triamcinolone .1%15 (g) mixed 50:50 with lotion to lower legs Peri-Wound Care: Sween Lotion (Moisturizing lotion) 1 x Per Week/7 Days Discharge Instructions: Apply moisturizing lotion as directed Compression Wrap: ThreePress (3 layer compression wrap) 1 x Per Week/7 Days Discharge Instructions: Apply three layer compression. may use unna layer at top top secure Wound #13 - Gluteus Wound Laterality: Left Cleanser: Wound Cleanser 3 x Per Week/7 Days Discharge Instructions: Cleanse the wound with wound cleanser prior to applying a clean dressing using gauze sponges, not  tissue or cotton balls. Peri-Wound Care: Skin Prep 3 x Per Week/7 Days Discharge Instructions: Use skin prep as  directed Prim Dressing: KerraCel Ag Gelling Fiber Dressing, 2x2 in (silver alginate) 3 x Per Week/7 Days ary Discharge Instructions: Apply silver alginate to wound bed as instructed Secondary Dressing: Bordered Gauze, 4x4 in 3 x Per Week/7 Days Discharge Instructions: Apply over primary dressing as directed. Electronic Signature(s) Signed: 07/13/2021 4:18:59 PM By: Baruch Gouty RN, BSN Signed: 07/22/2021 4:53:46 PM By: Linton Ham MD Previous Signature: 07/09/2021 5:16:34 PM Version By: Linton Ham MD Previous Signature: 07/09/2021 5:41:09 PM Version By: Baruch Gouty RN, BSN Entered By: Baruch Gouty on 07/13/2021 11:34:51 -------------------------------------------------------------------------------- Problem List Details Patient Name: Date of Service: Katrina Amel D. 07/09/2021 12:45 PM Medical Record Number: [id number] Patient Account Number: [id number] Date of Birth/Sex: Treating RN: 05/25/1932 (87 y.o. [name], [name] Primary Care Provider: [name] Other Clinician: Referring Provider: Treating Provider/Extender: [name] in Treatment: 2 Active Problems ICD-10 Encounter Code Description Active Date MDM Diagnosis I87.333 Chronic venous hypertension (idiopathic) with ulcer and inflammation of 06/25/2021 No Yes bilateral lower extremity I89.0 Lymphedema, not elsewhere classified 06/25/2021 No Yes L89.322 Pressure ulcer of left buttock, stage 2 06/25/2021 No Yes L97.821 Non-pressure chronic ulcer of other part of left lower leg limited to breakdown 06/25/2021 No Yes of skin L97.811 Non-pressure chronic ulcer of other part of right lower leg limited to breakdown 06/25/2021 No Yes of skin Inactive Problems Resolved Problems Electronic Signature(s) Signed: 07/09/2021 5:16:34 PM By: Linton Ham MD Entered By:  Linton Ham on 07/09/2021 13:37:46 -------------------------------------------------------------------------------- Progress Note Details Patient Name: Date of Service: Katrina Amel D. 07/09/2021 12:45 PM Medical Record Number: [id number] Patient Account Number: [id number] Date of Birth/Sex: Treating RN: 04-10-1932 (87 y.o. Katrina [name] Primary Care Provider: [name] Other Clinician: Referring Provider: Treating Provider/Extender: [name] in Treatment: 2 Subjective History of Present Illness (HPI) The following HPI elements were documented for the patient's wound: Location: right lower extremity Quality: Patient reports experiencing a dull pain to affected area(s). Severity: Patient states wound(s) are getting better. Duration: Patient has had the wound for 3 months prior to presenting for treatment Context: The wound would happen gradually Modifying Factors: Patient wound(s)/ulcer(s) are worsening due to :standing for a long while. Past medical history of pulmonary emboli, hypothyroidism, hyperlipidemia, hypertension, sleep apnea, gallstones, ulcerated lower extremities, atrial fibrillation, status post total knee replacement, cholecystectomy, cystoscopy and stent placement for kidney stones. She has never been a smoker. About a year ago she was seen by [name] with an ulcer of her right lower extremity which she's had for about over 3 months. On 10/23/14 -- Lower extremity venous duplex evaluation. She had no evidence of deep venous thromboses involving both lower extremities. There was evidence of deep vein reflux in the right and left common femoral veins. She also had evidence of great saphenous vein reflux noted only at the level of the saphenofemoral junction. No evidence of greater or small saphenous vein reflux. She will need a consult to the vascular surgeon. Seen by Dr. Mathis Fare office note dated 11/13/2014. He noted that she had good  arterial blood flow with palpable DP pulses bilaterally. Based on the patient's history Dr. Scot Dock recommended elevation when at rest of both feet and knees and ankles above heart level in the supine position. Activity such as walking and pool exercises were recommended. She will also have 15-20 mm thigh-high compression stockings daily once the ulcers have completely healed. A prescription was given to her. 11/04/2015 -- the wound on her right lower extremity has completely  healed and her edema has gone down significantly. However in her popliteal fossa where she's got a lot of fungal infection there is an area which is now an open wound and this will need attention. 12/02/2015 -- the right lower extremity looks much better than the edema has gone down significantly however she continues to have significant edema on the left side today. 12/09/2015 -- the patient has been doing her dressings as advised and says that overall she feels much better. She has received her juxta light for the left lower extremity READMISSION 06/25/2021 This is a patient who is actually had several previous visits to our clinic in 2014, 2016 and more recently 2017. This was largely because of wounds on her lower legs in the setting of chronic venous insufficiency and secondary lymphedema. Looking at my records she was apparently prescribed thigh-high compression stockings probably by vein and vascular although she comes back in not having worn any stockings in quite a period of time. She is now in the assisted living part of [facility]. She thinks her wounds have been there since April. This includes the left lateral, right anterior and right posterior lateral parts of both lower legs. She also has a stage II pressure ulcer on her left glued I think which is happened because of sleeping in a lift chair. She had ABDs and kerlix on her bilateral lower legs. Past medical history reviewed she has  hypothyroidism, left buttock pressure ulcer dating back to March of this year, iron deficiency, falls, paroxysmal atrial fibrillation anticoagulated and chronic venous insufficiency ABIs in our clinic were 0.86 on the right and 0.96 on the left 11/10; patient comes into clinic today after being admitted last week with uncontrolled lymphedema, severe venous insufficiency and some superficial wounds on her bilateral lower legs. We put her in 3 layer compression and she comes in today with all the wounds closed and considerable improvement in the condition of her lower legs. The patient is going to need lifelong compression stockings probably juxta lites and we are going to go ahead and order those for her today. Unfortunately the POA here is a niece in [place] were probably going to have to talk with her to arrange proper payment for the juxta lite stockings. She is in the assisted living level of [facility]. She does not feel she can get the stockings on independently I am hopeful that the nurse and aids at the assisted living can help her with this otherwise this may be a problem. She also has an area on her left buttock which is a stage II wound. A lot of unhealthy irritated skin around this I think from moisture prolonged sittingo Incontinence 11/17; the patient's area on the left buttock is just about closed although there is still a lot of unhealthy looking discolored skin around a large area here. I suspect this is chronic moisture, pressure, perhaps incontinence. she has minuscule open areas on her bilateral lower legs. Apparently our intermediary did not get the order for juxta lite stockings therefore she does not have any. Objective Constitutional Patient is hypertensive.. Pulse regular and within target range for patient.Marland Kitchen Respirations regular, non-labored and within target range.. Temperature is normal and within the target range for the patient.Marland Kitchen Appears in no  distress. Vitals Time Taken: 12:57 PM, Height: 61 in, Source: Stated, Weight: 221 lbs, Source: Stated, BMI: 41.8, Temperature: 98.3 F, Pulse: 67 bpm, Respiratory Rate: 20 breaths/min, Blood Pressure: 167/67 mmHg. General  Notes: Wound exam; most of the areas on her bilateral lower legs are healed. Still small pinpoint areas that are not completely closed. The degree of venous stasis/inflammation on her bilateral lower legs is a lot better with compression and TCA 0.1% cream. oo The area on the left buttock is down to subcentimeter levels Integumentary (Hair, Skin) Wound #10 status is Open. Original cause of wound was Gradually Appeared. The date acquired was: 10/21/2020. The wound has been in treatment 2 weeks. The wound is located on the Left,Lateral Lower Leg. The wound measures 0.1cm length x 0.1cm width x 0.1cm depth; 0.008cm^2 area and 0.001cm^3 volume. There is no tunneling or undermining noted. There is a none present amount of drainage noted. The wound margin is flat and intact. There is no granulation within the wound bed. There is no necrotic tissue within the wound bed. Wound #11 status is Open. Original cause of wound was Gradually Appeared. The date acquired was: 10/21/2020. The wound has been in treatment 2 weeks. The wound is located on the Right,Anterior Lower Leg. The wound measures 0.1cm length x 0.1cm width x 0.1cm depth; 0.008cm^2 area and 0.001cm^3 volume. The wound is limited to skin breakdown. There is no tunneling or undermining noted. There is a small amount of serous drainage noted. The wound margin is indistinct and nonvisible. There is no granulation within the wound bed. There is no necrotic tissue within the wound bed. Wound #12 status is Open. Original cause of wound was Gradually Appeared. The date acquired was: 10/21/2020. The wound has been in treatment 2 weeks. The wound is located on the Right,Lateral Lower Leg. The wound measures 0.1cm length x 0.1cm width x 0.1cm  depth; 0.008cm^2 area and 0.001cm^3 volume. There is Fat Layer (Subcutaneous Tissue) exposed. There is no tunneling or undermining noted. There is a small amount of serous drainage noted. The wound margin is flat and intact. There is small (1-33%) pink granulation within the wound bed. There is no necrotic tissue within the wound bed. Wound #13 status is Open. Original cause of wound was Gradually Appeared. The date acquired was: 10/21/2020. The wound has been in treatment 2 weeks. The wound is located on the Left Gluteus. The wound measures 0.5cm length x 0.5cm width x 0.1cm depth; 0.196cm^2 area and 0.02cm^3 volume. There is Fat Layer (Subcutaneous Tissue) exposed. There is a medium amount of serosanguineous drainage noted. The wound margin is flat and intact. There is large (67- 100%) red granulation within the wound bed. There is no necrotic tissue within the wound bed. Assessment Active Problems ICD-10 Chronic venous hypertension (idiopathic) with ulcer and inflammation of bilateral lower extremity Lymphedema, not elsewhere classified Pressure ulcer of left buttock, stage 2 Non-pressure chronic ulcer of other part of left lower leg limited to breakdown of skin Non-pressure chronic ulcer of other part of right lower leg limited to breakdown of skin Procedures Wound #10 Pre-procedure diagnosis of Wound #10 is a Venous Leg Ulcer located on the Left,Lateral Lower Leg . There was a Three Layer Compression Therapy Procedure by Baruch Gouty, RN. Post procedure Diagnosis Wound #10: Same as Pre-Procedure Wound #11 Pre-procedure diagnosis of Wound #11 is a Venous Leg Ulcer located on the Right,Anterior Lower Leg . There was a Three Layer Compression Therapy Procedure by Baruch Gouty, RN. Post procedure Diagnosis Wound #11: Same as Pre-Procedure Plan Follow-up Appointments: Return Appointment in 2 weeks. - with Dr. Arcola Jansky Shower/ Hygiene: May shower with protection but do not get  wound dressing(s) wet. -  Ok to use cast protector Edema Control - Lymphedema / SCD / Other: Elevate legs to the level of the heart or above for 30 minutes daily and/or when sitting, a frequency of: - throughout the day Avoid standing for long periods of time. Exercise regularly Moisturize legs daily. Compression stocking or Garment 20-30 mm/Hg pressure to: - may discontinue compression wraps and apply juxtalite compression garments daily to lower legs when available WOUND #10: - Lower Leg Wound Laterality: Left, Lateral Cleanser: Soap and Water 1 x Per Week/7 Days Discharge Instructions: May shower and wash wound with dial antibacterial soap and water prior to dressing change. Cleanser: Wound Cleanser 1 x Per Week/7 Days Discharge Instructions: Cleanse the wound with wound cleanser prior to applying a clean dressing using gauze sponges, not tissue or cotton balls. Peri-Wound Care: Triamcinolone 15 (g) 1 x Per Week/7 Days Discharge Instructions: Use triamcinolone .1%15 (g) mixed 50:50 with lotion to lower legs Peri-Wound Care: Sween Lotion (Moisturizing lotion) 1 x Per Week/7 Days Discharge Instructions: Apply moisturizing lotion as directed Com pression Wrap: ThreePress (3 layer compression wrap) 1 x Per Week/7 Days Discharge Instructions: Apply three layer compression. may use unna layer at top top secure Com pression Stockings: Circaid Juxta Lite Compression Wrap (DME) Compression Amount: 30-40 mmHg (left) Discharge Instructions: Apply Circaid Juxta Lite Compression Wrap daily as instructed. Apply first thing in the morning, remove at night before bed. WOUND #11: - Lower Leg Wound Laterality: Right, Anterior Cleanser: Soap and Water 1 x Per Week/7 Days Discharge Instructions: May shower and wash wound with dial antibacterial soap and water prior to dressing change. Cleanser: Wound Cleanser 1 x Per Week/7 Days Discharge Instructions: Cleanse the wound with wound cleanser prior to applying  a clean dressing using gauze sponges, not tissue or cotton balls. Peri-Wound Care: Triamcinolone 15 (g) 1 x Per Week/7 Days Discharge Instructions: Use triamcinolone .1%15 (g) mixed 50:50 with lotion to lower legs Peri-Wound Care: Sween Lotion (Moisturizing lotion) 1 x Per Week/7 Days Discharge Instructions: Apply moisturizing lotion as directed Com pression Wrap: ThreePress (3 layer compression wrap) 1 x Per Week/7 Days Discharge Instructions: Apply three layer compression. may use unna layer at top top secure Com pression Stockings: Circaid Juxta Lite Compression Wrap (DME) Compression Amount: 30-40 mmHg (right) Discharge Instructions: Apply Circaid Juxta Lite Compression Wrap daily as instructed. Apply first thing in the morning, remove at night before bed. WOUND #12: - Lower Leg Wound Laterality: Right, Lateral Cleanser: Soap and Water 1 x Per Week/7 Days Discharge Instructions: May shower and wash wound with dial antibacterial soap and water prior to dressing change. Cleanser: Wound Cleanser 1 x Per Week/7 Days Discharge Instructions: Cleanse the wound with wound cleanser prior to applying a clean dressing using gauze sponges, not tissue or cotton balls. Peri-Wound Care: Triamcinolone 15 (g) 1 x Per Week/7 Days Discharge Instructions: Use triamcinolone .1%15 (g) mixed 50:50 with lotion to lower legs Peri-Wound Care: Sween Lotion (Moisturizing lotion) 1 x Per Week/7 Days Discharge Instructions: Apply moisturizing lotion as directed Com pression Wrap: ThreePress (3 layer compression wrap) 1 x Per Week/7 Days Discharge Instructions: Apply three layer compression. may use unna layer at top top secure WOUND #13: - Gluteus Wound Laterality: Left Cleanser: Wound Cleanser 3 x Per Week/7 Days Discharge Instructions: Cleanse the wound with wound cleanser prior to applying a clean dressing using gauze sponges, not tissue or cotton balls. Peri-Wound Care: Skin Prep 3 x Per Week/7 Days Discharge  Instructions: Use skin prep as directed Prim  Dressing: KerraCel Ag Gelling Fiber Dressing, 2x2 in (silver alginate) 3 x Per Week/7 Days ary Discharge Instructions: Apply silver alginate to wound bed as instructed Secondary Dressing: Bordered Gauze, 4x4 in 3 x Per Week/7 Days Discharge Instructions: Apply over primary dressing as directed. 1. We wrapped her legs back up bilaterally with TCA on the skin extensively 2. Still collagen on the left buttock area. This is just about closed 3. We will reorder her juxta lite stockings Electronic Signature(s) Signed: 07/14/2021 2:02:45 PM By: Baruch Gouty RN, BSN Signed: 07/22/2021 4:53:46 PM By: Linton Ham MD Previous Signature: 07/09/2021 5:16:34 PM Version By: Linton Ham MD Entered By: Baruch Gouty on 07/13/2021 16:19:37 -------------------------------------------------------------------------------- SuperBill Details Patient Name: Date of Service: Katrina Amel D. 07/09/2021 Medical Record Number: [id number] Patient Account Number: [id number] Date of Birth/Sex: Treating RN: [birth year] (87 y.o. 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 Modifier: Quantity: 1 Physician Procedures Electronic  Signature(s) Signed: 07/09/2021 5:41:09 PM By: Baruch Gouty RN, BSN Signed: 07/22/2021 4:53:46 PM By: Linton Ham MD Previous Signature: 07/09/2021 5:16:34 PM Version By: Linton Ham MD Entered By: Baruch Gouty on 07/09/2021 17:24:21

## 2021-07-23 ENCOUNTER — Other Ambulatory Visit: Payer: Self-pay

## 2021-07-23 ENCOUNTER — Encounter (HOSPITAL_BASED_OUTPATIENT_CLINIC_OR_DEPARTMENT_OTHER): Payer: Medicare Other | Attending: Internal Medicine | Admitting: Internal Medicine

## 2021-07-23 ENCOUNTER — Encounter (HOSPITAL_BASED_OUTPATIENT_CLINIC_OR_DEPARTMENT_OTHER): Payer: Medicare Other | Admitting: Internal Medicine

## 2021-07-23 DIAGNOSIS — I87333 Chronic venous hypertension (idiopathic) with ulcer and inflammation of bilateral lower extremity: Secondary | ICD-10-CM | POA: Diagnosis not present

## 2021-07-23 DIAGNOSIS — I890 Lymphedema, not elsewhere classified: Secondary | ICD-10-CM | POA: Insufficient documentation

## 2021-07-23 DIAGNOSIS — L89322 Pressure ulcer of left buttock, stage 2: Secondary | ICD-10-CM | POA: Insufficient documentation

## 2021-07-23 NOTE — Progress Notes (Signed)
Katrina [name], Katrina [name] ([id number]) Visit Report for 07/23/2021 HPI Details Patient Name: Date of Service: [name], [name] 07/23/2021 12:45 PM Medical Record Number: [id number] Patient Account Number: [id number] Date of Birth/Sex: Treating RN: 05/21/1932 (87 y.o. Katrina [name] Primary Care Provider: [name] Other Clinician: Referring Provider: Treating Provider/Extender: [name] in Treatment: 4 History of Present Illness Location: right lower extremity Quality: Patient reports experiencing a dull pain to affected area(s). Severity: Patient states wound(s) are getting better. Duration: Patient has had the wound for 3 months prior to presenting for treatment Context: The wound would happen gradually Modifying Factors: Patient wound(s)/ulcer(s) are worsening due to :standing for a long while. HPI Description: Past medical history of pulmonary emboli, hypothyroidism, hyperlipidemia, hypertension, sleep apnea, gallstones, ulcerated lower extremities, atrial fibrillation, status post total knee replacement, cholecystectomy, cystoscopy and stent placement for kidney stones. She has never been a smoker. About a year ago she was seen by [name] with an ulcer of her right lower extremity which she's had for about over 3 months. On 10/23/14 -- Lower extremity venous duplex evaluation. She had no evidence of deep venous thromboses involving both lower extremities. There was evidence of deep vein reflux in the right and left common femoral veins. She also had evidence of great saphenous vein reflux noted only at the level of the saphenofemoral junction. No evidence of greater or small saphenous vein reflux. She will need a consult to the vascular surgeon. Seen by Dr. Mathis Fare office note dated 11/13/2014. He noted that she had good arterial blood flow with palpable DP pulses bilaterally. Based on the patient's history Dr. Scot Dock recommended elevation when at rest of both feet and  knees and ankles above heart level in the supine position. Activity such as walking and pool exercises were recommended. She will also have 15-20 mm thigh-high compression stockings daily once the ulcers have completely healed. A prescription was given to her. 11/04/2015 -- the wound on her right lower extremity has completely healed and her edema has gone down significantly. However in her popliteal fossa where she's got a lot of fungal infection there is an area which is now an open wound and this will need attention. 12/02/2015 -- the right lower extremity looks much better than the edema has gone down significantly however she continues to have significant edema on the left side today. 12/09/2015 -- the patient has been doing her dressings as advised and says that overall she feels much better. She has received her juxta light for the left lower extremity READMISSION 06/25/2021 This is a patient who is actually had several previous visits to our clinic in 2014, 2016 and more recently 2017. This was largely because of wounds on her lower legs in the setting of chronic venous insufficiency and secondary lymphedema. Looking at my records she was apparently prescribed thigh-high compression stockings probably by vein and vascular although she comes back in not having worn any stockings in quite a period of time. She is now in the assisted living part of [facility]. She thinks her wounds have been there since April. This includes the left lateral, right anterior and right posterior lateral parts of both lower legs. She also has a stage II pressure ulcer on her left glued I think which is happened because of sleeping in a lift chair. She had ABDs and kerlix on her bilateral lower legs. Past medical history reviewed she has hypothyroidism, left buttock pressure ulcer dating back to March  of this year, iron deficiency, falls, paroxysmal atrial fibrillation anticoagulated  and chronic venous insufficiency ABIs in our clinic were 0.86 on the right and 0.96 on the left 11/10; patient comes into clinic today after being admitted last week with uncontrolled lymphedema, severe venous insufficiency and some superficial wounds on her bilateral lower legs. We put her in 3 layer compression and she comes in today with all the wounds closed and considerable improvement in the condition of her lower legs. The patient is going to need lifelong compression stockings probably juxta lites and we are going to go ahead and order those for her today. Unfortunately the POA here is a niece in [place] were probably going to have to talk with her to arrange proper payment for the juxta lite stockings. She is in the assisted living level of [facility]. She does not feel she can get the stockings on independently I am hopeful that the nurse and aids at the assisted living can help her with this otherwise this may be a problem. She also has an area on her left buttock which is a stage II wound. A lot of unhealthy irritated skin around this I think from moisture prolonged sittingo Incontinence 11/17; the patient's area on the left buttock is just about closed although there is still a lot of unhealthy looking discolored skin around a large area here. I suspect this is chronic moisture, pressure, perhaps incontinence. she has minuscule open areas on her bilateral lower legs. Apparently our intermediary did not get the order for juxta lite stockings therefore she does not have any. 12/1; both leg wounds are healed and she has been wearing bilateral juxta lite stockings for 2 days. [facility] are [facility] her with this. The left buttock wound unfortunately has deteriorated Electronic Signature(s) Signed: 07/23/2021 4:34:24 PM By: Linton Ham MD Entered By: Linton Ham on 07/23/2021  14:00:37 -------------------------------------------------------------------------------- Physical Exam Details Patient Name: Date of Service: Katrina Amel D. 07/23/2021 12:45 PM Medical Record Number: [id number] Patient Account Number: [id number] Date of Birth/Sex: Treating RN: 21-Nov-1931 (87 y.o. Katrina [name] Primary Care Provider: [name] Other Clinician: Referring Provider: Treating Provider/Extender: [name] in Treatment: 4 Constitutional Sitting or standing Blood Pressure is within target range for patient.. Pulse regular and within target range for patient.Marland Kitchen Respirations regular, non-labored and within target range.. Temperature is normal and within the target range for the patient.Marland Kitchen Appears in no distress. Notes Wound exam; the areas on her lower legs are healed. She had her juxta lite stockings. Unfortunately the area on the left buttock again has reopened. The tissue around the open area has a fleshy soft feel to it. Skin around this area appears chronically inflamed, wet and macerated probably from urinary incontinence and unrelieved pressure Electronic Signature(s) Signed: 07/23/2021 4:34:24 PM By: Linton Ham MD Entered By: Linton Ham on 07/23/2021 14:02:26 -------------------------------------------------------------------------------- Physician Orders Details Patient Name: Date of Service: Katrina Amel D. 07/23/2021 12:45 PM Medical Record Number: [id number] Patient Account Number: [id number] Date of Birth/Sex: Treating RN: 23-Jul-1932 (88 y.o. [name], Lauren Primary Care Provider: [name] Other Clinician: Referring Provider: Treating Provider/Extender: [name] in Treatment: 4 Verbal / Phone Orders: No Diagnosis Coding Follow-up Appointments ppointment in 2 weeks. - with Dr. Dellia Nims Return A Bathing/ Shower/ Hygiene May shower with protection but do not get wound dressing(s)  wet. - Ok to use cast protector Edema Control - Lymphedema / SCD / Other Elevate  legs to the level of the heart or above for 30 minutes daily and/or when sitting, a frequency of: - throughout the day Avoid standing for long periods of time. Exercise regularly Moisturize legs daily. Compression stocking or Garment 20-30 mm/Hg pressure to: - may discontinue compression wraps and apply juxtalite compression garments daily to lower legs when available Wound Treatment Wound #10 - Lower Leg Wound Laterality: Left, Lateral Cleanser: Soap and Water 1 x Per Week/7 Days Discharge Instructions: May shower and wash wound with dial antibacterial soap and water prior to dressing change. Cleanser: Wound Cleanser 1 x Per Week/7 Days Discharge Instructions: Cleanse the wound with wound cleanser prior to applying a clean dressing using gauze sponges, not tissue or cotton balls. Peri-Wound Care: Triamcinolone 15 (g) 1 x Per Week/7 Days Discharge Instructions: Use triamcinolone .1%15 (g) mixed 50:50 with lotion to lower legs Peri-Wound Care: Sween Lotion (Moisturizing lotion) 1 x Per Week/7 Days Discharge Instructions: Apply moisturizing lotion as directed Compression Wrap: ThreePress (3 layer compression wrap) 1 x Per Week/7 Days Discharge Instructions: Apply three layer compression. may use unna layer at top top secure Compression Stockings: Circaid Juxta Lite Compression Wrap Left Leg Compression Amount: 30-40 mmHG Discharge Instructions: Apply Circaid Juxta Lite Compression Wrap daily as instructed. Apply first thing in the morning, remove at night before bed. Wound #11 - Lower Leg Wound Laterality: Right, Anterior Cleanser: Soap and Water 1 x Per Week/7 Days Discharge Instructions: May shower and wash wound with dial antibacterial soap and water prior to dressing change. Cleanser: Wound Cleanser 1 x Per Week/7 Days Discharge Instructions: Cleanse the wound with wound cleanser prior to applying a clean  dressing using gauze sponges, not tissue or cotton balls. Peri-Wound Care: Triamcinolone 15 (g) 1 x Per Week/7 Days Discharge Instructions: Use triamcinolone .1%15 (g) mixed 50:50 with lotion to lower legs Peri-Wound Care: Sween Lotion (Moisturizing lotion) 1 x Per Week/7 Days Discharge Instructions: Apply moisturizing lotion as directed Compression Wrap: ThreePress (3 layer compression wrap) 1 x Per Week/7 Days Discharge Instructions: Apply three layer compression. may use unna layer at top top secure Compression Stockings: Circaid Juxta Lite Compression Wrap Right Leg Compression Amount: 30-40 mmHG Discharge Instructions: Apply Circaid Juxta Lite Compression Wrap daily as instructed. Apply first thing in the morning, remove at night before bed. Wound #12 - Lower Leg Wound Laterality: Right, Lateral Cleanser: Soap and Water 1 x Per Week/7 Days Discharge Instructions: May shower and wash wound with dial antibacterial soap and water prior to dressing change. Cleanser: Wound Cleanser 1 x Per Week/7 Days Discharge Instructions: Cleanse the wound with wound cleanser prior to applying a clean dressing using gauze sponges, not tissue or cotton balls. Peri-Wound Care: Triamcinolone 15 (g) 1 x Per Week/7 Days Discharge Instructions: Use triamcinolone .1%15 (g) mixed 50:50 with lotion to lower legs Peri-Wound Care: Sween Lotion (Moisturizing lotion) 1 x Per Week/7 Days Discharge Instructions: Apply moisturizing lotion as directed Compression Wrap: ThreePress (3 layer compression wrap) 1 x Per Week/7 Days Discharge Instructions: Apply three layer compression. may use unna layer at top top secure Wound #13 - Gluteus Wound Laterality: Left Cleanser: Wound Cleanser 1 x Per Day/15 Days Discharge Instructions: Cleanse the wound with wound cleanser prior to applying a clean dressing using gauze sponges, not tissue or cotton balls. Peri-Wound Care: Skin Prep 1 x Per Day/15 Days Discharge Instructions: Use  skin prep as directed Prim Dressing: KerraCel Ag Gelling Fiber Dressing, 2x2 in (silver alginate) 1 x Per Day/15 Days ary Discharge  Instructions: Apply silver alginate to wound bed as instructed Secondary Dressing: Bordered Gauze, 4x4 in 1 x Per Day/15 Days Discharge Instructions: Apply over primary dressing as directed. Electronic Signature(s) Signed: 07/23/2021 4:34:24 PM By: Linton Ham MD Signed: 07/23/2021 5:20:51 PM By: Rhae Hammock RN Entered By: Rhae Hammock on 07/23/2021 13:55:25 -------------------------------------------------------------------------------- Problem List Details Patient Name: Date of Service: Katrina Amel D. 07/23/2021 12:45 PM Medical Record Number: [id number] Patient Account Number: [id number] Date of Birth/Sex: Treating RN: 08-Sep-1931 (87 y.o. [name], Meta.Reding Primary Care Provider: [name] Other Clinician: Referring Provider: Treating Provider/Extender: [name] in Treatment: 4 Active Problems ICD-10 Encounter Code Description Active Date MDM Diagnosis I87.333 Chronic venous hypertension (idiopathic) with ulcer and inflammation of 06/25/2021 No Yes bilateral lower extremity I89.0 Lymphedema, not elsewhere classified 06/25/2021 No Yes L89.322 Pressure ulcer of left buttock, stage 2 06/25/2021 No Yes L97.821 Non-pressure chronic ulcer of other part of left lower leg limited to breakdown 06/25/2021 No Yes of skin L97.811 Non-pressure chronic ulcer of other part of right lower leg limited to breakdown 06/25/2021 No Yes of skin Inactive Problems Resolved Problems Electronic Signature(s) Signed: 07/23/2021 4:34:24 PM By: Linton Ham MD Entered By: Linton Ham on 07/23/2021 13:59:20 -------------------------------------------------------------------------------- Progress Note Details Patient Name: Date of Service: Katrina Amel D. 07/23/2021 12:45 PM Medical Record Number: [id number] Patient Account  Number: [id number] Date of Birth/Sex: Treating RN: [birth year] (87 y.o. Katrina [name] Primary Care Provider: [name] Other Clinician: Referring Provider: Treating Provider/Extender: [name] in Treatment: 4 Subjective History of Present Illness (HPI) The following HPI elements were documented for the patient's wound: Location: right lower extremity Quality: Patient reports experiencing a dull pain to affected area(s). Severity: Patient states wound(s) are getting better. Duration: Patient has had the wound for 3 months prior to presenting for treatment Context: The wound would happen gradually Modifying Factors: Patient wound(s)/ulcer(s) are worsening due to :standing for a long while. Past medical history of pulmonary emboli, hypothyroidism, hyperlipidemia, hypertension, sleep apnea, gallstones, ulcerated lower extremities, atrial fibrillation, status post total knee replacement, cholecystectomy, cystoscopy and stent placement for kidney stones. She has never been a smoker. About a year ago she was seen by [name] with an ulcer of her right lower extremity which she's had for about over 3 months. On 10/23/14 -- Lower extremity venous duplex evaluation. She had no evidence of deep venous thromboses involving both lower extremities. There was evidence of deep vein reflux in the right and left common femoral veins. She also had evidence of great saphenous vein reflux noted only at the level of the saphenofemoral junction. No evidence of greater or small saphenous vein reflux. She will need a consult to the vascular surgeon. Seen by Dr. Mathis Fare office note dated 11/13/2014. He noted that she had good arterial blood flow with palpable DP pulses bilaterally. Based on the patient's history Dr. Scot Dock recommended elevation when at rest of both feet and knees and ankles above heart level in the supine position. Activity such as walking and pool exercises were  recommended. She will also have 15-20 mm thigh-high compression stockings daily once the ulcers have completely healed. A prescription was given to her. 11/04/2015 -- the wound on her right lower extremity has completely healed and her edema has gone down significantly. However in her popliteal fossa where she's got a lot of fungal infection there is an area which is now an open wound and this will need attention. 12/02/2015 -- the right lower  extremity looks much better than the edema has gone down significantly however she continues to have significant edema on the left side today. 12/09/2015 -- the patient has been doing her dressings as advised and says that overall she feels much better. She has received her juxta light for the left lower extremity READMISSION 06/25/2021 This is a patient who is actually had several previous visits to our clinic in 2014, 2016 and more recently 2017. This was largely because of wounds on her lower legs in the setting of chronic venous insufficiency and secondary lymphedema. Looking at my records she was apparently prescribed thigh-high compression stockings probably by vein and vascular although she comes back in not having worn any stockings in quite a period of time. She is now in the assisted living part of [facility]. She thinks her wounds have been there since April. This includes the left lateral, right anterior and right posterior lateral parts of both lower legs. She also has a stage II pressure ulcer on her left glued I think which is happened because of sleeping in a lift chair. She had ABDs and kerlix on her bilateral lower legs. Past medical history reviewed she has hypothyroidism, left buttock pressure ulcer dating back to March of this year, iron deficiency, falls, paroxysmal atrial fibrillation anticoagulated and chronic venous insufficiency ABIs in our clinic were 0.86 on the right and 0.96 on the left 11/10;  patient comes into clinic today after being admitted last week with uncontrolled lymphedema, severe venous insufficiency and some superficial wounds on her bilateral lower legs. We put her in 3 layer compression and she comes in today with all the wounds closed and considerable improvement in the condition of her lower legs. The patient is going to need lifelong compression stockings probably juxta lites and we are going to go ahead and order those for her today. Unfortunately the POA here is a niece in [place] were probably going to have to talk with her to arrange proper payment for the juxta lite stockings. She is in the assisted living level of [facility]. She does not feel she can get the stockings on independently I am hopeful that the nurse and aids at the assisted living can help her with this otherwise this may be a problem. She also has an area on her left buttock which is a stage II wound. A lot of unhealthy irritated skin around this I think from moisture prolonged sittingo Incontinence 11/17; the patient's area on the left buttock is just about closed although there is still a lot of unhealthy looking discolored skin around a large area here. I suspect this is chronic moisture, pressure, perhaps incontinence. she has minuscule open areas on her bilateral lower legs. Apparently our intermediary did not get the order for juxta lite stockings therefore she does not have any. 12/1; both leg wounds are healed and she has been wearing bilateral juxta lite stockings for 2 days. [facility] her with this. The left buttock wound unfortunately has deteriorated Objective Constitutional Sitting or standing Blood Pressure is within target range for patient.. Pulse regular and within target range for patient.Marland Kitchen Respirations regular, non-labored and within target range.. Temperature is normal and within the target range for the patient.Marland Kitchen Appears in no  distress. Vitals Time Taken: 1:17 PM, Height: 61 in, Weight: 221 lbs, BMI: 41.8, Temperature: 98.7 F, Pulse: 74 bpm, Respiratory Rate: 17 breaths/min, Blood Pressure: 134/74 mmHg. General Notes: Wound exam;  the areas on her lower legs are healed. She had her juxta lite stockings. oo Unfortunately the area on the left buttock again has reopened. The tissue around the open area has a fleshy soft feel to it. Skin around this area appears chronically inflamed, wet and macerated probably from urinary incontinence and unrelieved pressure Integumentary (Hair, Skin) Wound #10 status is Open. Original cause of wound was Gradually Appeared. The date acquired was: 10/21/2020. The wound has been in treatment 4 weeks. The wound is located on the Left,Lateral Lower Leg. The wound measures 0cm length x 0cm width x 0cm depth; 0cm^2 area and 0cm^3 volume. There is a none present amount of drainage noted. The wound margin is flat and intact. There is no granulation within the wound bed. There is no necrotic tissue within the wound bed. Wound #11 status is Open. Original cause of wound was Gradually Appeared. The date acquired was: 10/21/2020. The wound has been in treatment 4 weeks. The wound is located on the Right,Anterior Lower Leg. The wound measures 0cm length x 0cm width x 0cm depth; 0cm^2 area and 0cm^3 volume. The wound is limited to skin breakdown. There is a small amount of serous drainage noted. The wound margin is indistinct and nonvisible. There is no granulation within the wound bed. There is no necrotic tissue within the wound bed. Wound #12 status is Open. Original cause of wound was Gradually Appeared. The date acquired was: 10/21/2020. The wound has been in treatment 4 weeks. The wound is located on the Right,Lateral Lower Leg. The wound measures 0cm length x 0cm width x 0cm depth; 0cm^2 area and 0cm^3 volume. There is Fat Layer (Subcutaneous Tissue) exposed. There is a small amount of serous drainage  noted. The wound margin is flat and intact. There is small (1-33%) pink granulation within the wound bed. There is no necrotic tissue within the wound bed. Wound #13 status is Open. Original cause of wound was Gradually Appeared. The date acquired was: 10/21/2020. The wound has been in treatment 4 weeks. The wound is located on the Left Gluteus. The wound measures 3cm length x 4cm width x 0.2cm depth; 9.425cm^2 area and 1.885cm^3 volume. There is Fat Layer (Subcutaneous Tissue) exposed. There is no tunneling or undermining noted. There is a medium amount of serosanguineous drainage noted. The wound margin is flat and intact. There is large (67-100%) red granulation within the wound bed. There is no necrotic tissue within the wound bed. Assessment Active Problems ICD-10 Chronic venous hypertension (idiopathic) with ulcer and inflammation of bilateral lower extremity Lymphedema, not elsewhere classified Pressure ulcer of left buttock, stage 2 Non-pressure chronic ulcer of other part of left lower leg limited to breakdown of skin Non-pressure chronic ulcer of other part of right lower leg limited to breakdown of skin Plan Follow-up Appointments: Return Appointment in 2 weeks. - with Dr. Arcola Jansky Shower/ Hygiene: May shower with protection but do not get wound dressing(s) wet. - Ok to use cast protector Edema Control - Lymphedema / SCD / Other: Elevate legs to the level of the heart or above for 30 minutes daily and/or when sitting, a frequency of: - throughout the day Avoid standing for long periods of time. Exercise regularly Moisturize legs daily. Compression stocking or Garment 20-30 mm/Hg pressure to: - may discontinue compression wraps and apply juxtalite compression garments daily to lower legs when available WOUND #10: - Lower Leg Wound Laterality: Left, Lateral Cleanser: Soap and Water 1 x Per Week/7 Days Discharge Instructions: May  shower and wash wound with dial antibacterial  soap and water prior to dressing change. Cleanser: Wound Cleanser 1 x Per Week/7 Days Discharge Instructions: Cleanse the wound with wound cleanser prior to applying a clean dressing using gauze sponges, not tissue or cotton balls. Peri-Wound Care: Triamcinolone 15 (g) 1 x Per Week/7 Days Discharge Instructions: Use triamcinolone .1%15 (g) mixed 50:50 with lotion to lower legs Peri-Wound Care: Sween Lotion (Moisturizing lotion) 1 x Per Week/7 Days Discharge Instructions: Apply moisturizing lotion as directed Com pression Wrap: ThreePress (3 layer compression wrap) 1 x Per Week/7 Days Discharge Instructions: Apply three layer compression. may use unna layer at top top secure Com pression Stockings: Circaid Juxta Lite Compression Wrap Compression Amount: 30-40 mmHg (left) Discharge Instructions: Apply Circaid Juxta Lite Compression Wrap daily as instructed. Apply first thing in the morning, remove at night before bed. WOUND #11: - Lower Leg Wound Laterality: Right, Anterior Cleanser: Soap and Water 1 x Per Week/7 Days Discharge Instructions: May shower and wash wound with dial antibacterial soap and water prior to dressing change. Cleanser: Wound Cleanser 1 x Per Week/7 Days Discharge Instructions: Cleanse the wound with wound cleanser prior to applying a clean dressing using gauze sponges, not tissue or cotton balls. Peri-Wound Care: Triamcinolone 15 (g) 1 x Per Week/7 Days Discharge Instructions: Use triamcinolone .1%15 (g) mixed 50:50 with lotion to lower legs Peri-Wound Care: Sween Lotion (Moisturizing lotion) 1 x Per Week/7 Days Discharge Instructions: Apply moisturizing lotion as directed Com pression Wrap: ThreePress (3 layer compression wrap) 1 x Per Week/7 Days Discharge Instructions: Apply three layer compression. may use unna layer at top top secure Com pression Stockings: Circaid Juxta Lite Compression Wrap Compression Amount: 30-40 mmHg (right) Discharge Instructions: Apply  Circaid Juxta Lite Compression Wrap daily as instructed. Apply first thing in the morning, remove at night before bed. WOUND #12: - Lower Leg Wound Laterality: Right, Lateral Cleanser: Soap and Water 1 x Per Week/7 Days Discharge Instructions: May shower and wash wound with dial antibacterial soap and water prior to dressing change. Cleanser: Wound Cleanser 1 x Per Week/7 Days Discharge Instructions: Cleanse the wound with wound cleanser prior to applying a clean dressing using gauze sponges, not tissue or cotton balls. Peri-Wound Care: Triamcinolone 15 (g) 1 x Per Week/7 Days Discharge Instructions: Use triamcinolone .1%15 (g) mixed 50:50 with lotion to lower legs Peri-Wound Care: Sween Lotion (Moisturizing lotion) 1 x Per Week/7 Days Discharge Instructions: Apply moisturizing lotion as directed Com pression Wrap: ThreePress (3 layer compression wrap) 1 x Per Week/7 Days Discharge Instructions: Apply three layer compression. may use unna layer at top top secure WOUND #13: - Gluteus Wound Laterality: Left Cleanser: Wound Cleanser 1 x Per Day/15 Days Discharge Instructions: Cleanse the wound with wound cleanser prior to applying a clean dressing using gauze sponges, not tissue or cotton balls. Peri-Wound Care: Skin Prep 1 x Per Day/15 Days Discharge Instructions: Use skin prep as directed Prim Dressing: KerraCel Ag Gelling Fiber Dressing, 2x2 in (silver alginate) 1 x Per Day/15 Days ary Discharge Instructions: Apply silver alginate to wound bed as instructed Secondary Dressing: Bordered Gauze, 4x4 in 1 x Per Day/15 Days Discharge Instructions: Apply over primary dressing as directed. 1. Continuing with silver alginate to the left buttock. 2. My thoughts looking at this today was this may need debridement and perhaps a biopsy. It is protruding tissue. There is no nodular or subcutaneous feel to it 3. The wound is also surrounded by chronically irritated tissue probably from excess  pressure,  friction and incontinence 4. The wounds on her lower extremities are healed and she is wearing bilateral juxta lite stockings. Everything looks satisfactory here Electronic Signature(s) Signed: 07/23/2021 4:34:24 PM By: Linton Ham MD Entered By: Linton Ham on 07/23/2021 14:05:55 -------------------------------------------------------------------------------- SuperBill Details Patient Name: Date of Service: Katrina Amel D. 07/23/2021 Medical Record Number: [id number] Patient Account Number: [id number] Date of Birth/Sex: Treating RN: 08-04-1932 (87 y.o. 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

## 2021-07-23 NOTE — Progress Notes (Signed)
Katrina [name] ([id number]) Visit Report for 07/23/2021 Arrival Information Details Patient Name: Date of Service: Katrina [name], Katrina [name] 07/23/2021 12:45 PM Medical Record Number: [id number] Patient Account Number: [id number] Date of Birth/Sex: Treating RN: 01-Oct-1931 (87 y.o. Katrina [name] Primary Care Jaanai Salemi: [name] Other Clinician: Referring Shaylen Nephew: Treating Shonique Pelphrey/Extender: [name] in Treatment: 4 Visit Information History Since Last Visit Added or deleted any medications: No Patient Arrived: Gilford Rile Any new allergies or adverse reactions: No Arrival Time: 13:16 Had a fall or experienced change in No Accompanied By: self activities of daily living that may affect Transfer Assistance: Manual risk of falls: Patient Identification Verified: Yes Signs or symptoms of abuse/neglect since last visito No Secondary Verification Process Completed: Yes Hospitalized since last visit: No Patient Requires Transmission-Based Precautions: No Implantable device outside of the clinic excluding No Patient Has Alerts: Yes cellular tissue based products placed in the center Patient Alerts: Patient on Blood Thinner since last visit: Has Dressing in Place as Prescribed: Yes Pain Present Now: No Electronic Signature(s) Signed: 07/23/2021 5:20:51 PM By: Rhae Hammock RN Entered By: Rhae Hammock on 07/23/2021 13:16:54 -------------------------------------------------------------------------------- Clinic Level of Care Assessment Details Patient Name: Date of Service: EUDELIA, HILTUNEN D. 07/23/2021 12:45 PM Medical Record Number: [id number] Patient Account Number: [id number] Date of Birth/Sex: Treating RN: 1932-04-16 (87 y.o. Katrina [name] Primary Care Alante Weimann: [name] Other Clinician: Referring Dalya Maselli: Treating Elenora Hawbaker/Extender: [name] in Treatment: 4 Clinic Level of Care Assessment Items TOOL 4  Quantity Score X- 1 0 Use when only an EandM is performed on FOLLOW-UP visit ASSESSMENTS - Nursing Assessment / Reassessment X- 1 10 Reassessment of Co-morbidities (includes updates in patient status) X- 1 5 Reassessment of Adherence to Treatment Plan ASSESSMENTS - Wound and Skin A ssessment / Reassessment []  - 0 Simple Wound Assessment / Reassessment - one wound X- 4 5 Complex Wound Assessment / Reassessment - multiple wounds []  - 0 Dermatologic / Skin Assessment (not related to wound area) ASSESSMENTS - Focused Assessment []  - 0 Circumferential Edema Measurements - multi extremities []  - 0 Nutritional Assessment / Counseling / Intervention []  - 0 Lower Extremity Assessment (monofilament, tuning fork, pulses) []  - 0 Peripheral Arterial Disease Assessment (using hand held doppler) ASSESSMENTS - Ostomy and/or Continence Assessment and Care []  - 0 Incontinence Assessment and Management []  - 0 Ostomy Care Assessment and Management (repouching, etc.) PROCESS - Coordination of Care []  - 0 Simple Patient / Family Education for ongoing care X- 1 20 Complex (extensive) Patient / Family Education for ongoing care X- 1 10 Staff obtains Programmer, systems, Records, T Results / Process Orders est X- 1 10 Staff telephones HHA, Nursing Homes / Clarify orders / etc []  - 0 Routine Transfer to another Facility (non-emergent condition) []  - 0 Routine Hospital Admission (non-emergent condition) []  - 0 New Admissions / Biomedical engineer / Ordering NPWT Apligraf, etc. , []  - 0 Emergency Hospital Admission (emergent condition) []  - 0 Simple Discharge Coordination X- 1 15 Complex (extensive) Discharge Coordination PROCESS - Special Needs []  - 0 Pediatric / Minor Patient Management []  - 0 Isolation Patient Management []  - 0 Hearing / Language / Visual special needs []  - 0 Assessment of Community assistance (transportation, D/C planning, etc.) []  - 0 Additional assistance / Altered  mentation []  - 0 Support Surface(s) Assessment (bed, cushion, seat, etc.) INTERVENTIONS - Wound Cleansing / Measurement []  - 0 Simple Wound Cleansing - one wound X- 4 5 Complex Wound Cleansing -  multiple wounds X- 1 5 Wound Imaging (photographs - any number of wounds) []  - 0 Wound Tracing (instead of photographs) []  - 0 Simple Wound Measurement - one wound X- 4 5 Complex Wound Measurement - multiple wounds INTERVENTIONS - Wound Dressings X - Small Wound Dressing one or multiple wounds 1 10 []  - 0 Medium Wound Dressing one or multiple wounds []  - 0 Large Wound Dressing one or multiple wounds X- 1 5 Application of Medications - topical []  - 0 Application of Medications - injection INTERVENTIONS - Miscellaneous []  - 0 External ear exam []  - 0 Specimen Collection (cultures, biopsies, blood, body fluids, etc.) []  - 0 Specimen(s) / Culture(s) sent or taken to Lab for analysis []  - 0 Patient Transfer (multiple staff / Civil Service fast streamer / Similar devices) []  - 0 Simple Staple / Suture removal (25 or less) []  - 0 Complex Staple / Suture removal (26 or more) []  - 0 Hypo / Hyperglycemic Management (close monitor of Blood Glucose) []  - 0 Ankle / Brachial Index (ABI) - do not check if billed separately X- 1 5 Vital Signs Has the patient been seen at the hospital within the last three years: Yes Total Score: 155 Level Of Care: New/Established - Level 4 Electronic Signature(s) Signed: 07/23/2021 5:20:51 PM By: Rhae Hammock RN Entered By: Rhae Hammock on 07/23/2021 13:58:25 -------------------------------------------------------------------------------- Encounter Discharge Information Details Patient Name: Date of Service: Katrina Amel D. 07/23/2021 12:45 PM Medical Record Number: [id number] Patient Account Number: [id number] Date of Birth/Sex: Treating RN: [birth year] (87 y.o. Katrina [name] Primary Care Charlei Ramsaran: [name] Other Clinician: Referring  Sohana Austell: Treating Daviona Herbert/Extender: [name] in Treatment: 4 Encounter Discharge Information Items Discharge Condition: Stable Ambulatory Status: Cane Discharge Destination: Home Transportation: Private Auto Accompanied By: self Schedule Follow-up Appointment: Yes Clinical Summary of Care: Patient Declined Electronic Signature(s) Signed: 07/23/2021 5:20:51 PM By: Rhae Hammock RN Entered By: Rhae Hammock on 07/23/2021 13:59:40 -------------------------------------------------------------------------------- Lower Extremity Assessment Details Patient Name: Date of Service: Katrina Amel D. 07/23/2021 12:45 PM Medical Record Number: [id number] Patient Account Number: [id number] Date of Birth/Sex: Treating RN: 21-Mar-1932 (87 y.o. Katrina [name] Primary Care Catalyna Reilly: [name] Other Clinician: Referring Nehemiah Mcfarren: Treating Rayane Gallardo/Extender: [name] in Treatment: 4 Edema Assessment Assessed: Shirlyn Goltz: Yes] Patrice Paradise: Yes] Edema: [Left: Ye] [Right: s] Calf Left: Right: Point of Measurement: 29 cm From Medial Instep 51.5 cm 49.5 cm Ankle Left: Right: Point of Measurement: 11 cm From Medial Instep 24 cm 23.8 cm Electronic Signature(s) Signed: 07/23/2021 5:20:51 PM By: Rhae Hammock RN Entered By: Rhae Hammock on 07/23/2021 13:15:48 -------------------------------------------------------------------------------- Multi Wound Chart Details Patient Name: Date of Service: Katrina Amel D. 07/23/2021 12:45 PM Medical Record Number: [id number] Patient Account Number: [id number] Date of Birth/Sex: Treating RN: 1932/06/27 (87 y.o. [name], Meta.Reding Primary Care Keilani Terrance: [name] Other Clinician: Referring Shaneeka Scarboro: Treating Mio Schellinger/Extender: [name] in Treatment: 4 Vital Signs Height(in): 32 Pulse(bpm): 89 Weight(lbs): 221 Blood Pressure(mmHg): 134/74 Body Mass  Index(BMI): 42 Temperature(F): 98.7 Respiratory Rate(breaths/min): 17 Photos: Left, Lateral Lower Leg Right, Anterior Lower Leg Right, Lateral Lower Leg Wound Location: Gradually Appeared Gradually Appeared Gradually Appeared Wounding Event: Venous Leg Ulcer Venous Leg Ulcer Venous Leg Ulcer Primary Etiology: Sleep Apnea, Arrhythmia, Sleep Apnea, Arrhythmia, Sleep Apnea, Arrhythmia, Comorbid History: Hypertension, Phlebitis Hypertension, Phlebitis Hypertension, Phlebitis 10/21/2020 10/21/2020 10/21/2020 Date Acquired: 4 4 4  Weeks of Treatment: Open Open Open Wound Status: No No No Clustered Wound: N/A N/A N/A Clustered Quantity:  0x0x0 0x0x0 0x0x0 Measurements L x W x D (cm) 0 0 0 A (cm) : rea 0 0 0 Volume (cm) : 100.00% 100.00% 100.00% % Reduction in Area: 100.00% 100.00% 100.00% % Reduction in Volume: Full Thickness Without Exposed Full Thickness Without Exposed Full Thickness Without Exposed Classification: Support Structures Support Structures Support Structures None Present Small Small Exudate Amount: N/A Serous Serous Exudate Type: N/A amber amber Exudate Color: Flat and Intact Indistinct, nonvisible Flat and Intact Wound Margin: None Present (0%) None Present (0%) Small (1-33%) Granulation Amount: N/A N/A Pink Granulation Quality: None Present (0%) None Present (0%) None Present (0%) Necrotic Amount: Fascia: No Fascia: No Fat Layer (Subcutaneous Tissue): Yes Exposed Structures: Fat Layer (Subcutaneous Tissue): No Fat Layer (Subcutaneous Tissue): No Fascia: No Tendon: No Tendon: No Tendon: No Muscle: No Muscle: No Muscle: No Joint: No Joint: No Joint: No Bone: No Bone: No Bone: No Limited to Skin Breakdown Large (67-100%) Large (67-100%) Large (67-100%) Epithelialization: Wound Number: 13 N/A N/A Photos: N/A N/A Left Gluteus N/A N/A Wound Location: Gradually Appeared N/A N/A Wounding Event: Pressure Ulcer N/A N/A Primary  Etiology: Sleep Apnea, Arrhythmia, N/A N/A Comorbid History: Hypertension, Phlebitis 10/21/2020 N/A N/A Date Acquired: 4 N/A N/A Weeks of Treatment: Open N/A N/A Wound Status: Yes N/A N/A Clustered Wound: 2 N/A N/A Clustered Quantity: 3x4x0.2 N/A N/A Measurements L x W x D (cm) 9.425 N/A N/A A (cm) : rea 1.885 N/A N/A Volume (cm) : -2298.20% N/A N/A % Reduction in A rea: -4733.30% N/A N/A % Reduction in Volume: Category/Stage II N/A N/A Classification: Medium N/A N/A Exudate A mount: Serosanguineous N/A N/A Exudate Type: red, brown N/A N/A Exudate Color: Flat and Intact N/A N/A Wound Margin: Large (67-100%) N/A N/A Granulation A mount: Red N/A N/A Granulation Quality: None Present (0%) N/A N/A Necrotic A mount: Fat Layer (Subcutaneous Tissue): Yes N/A N/A Exposed Structures: Fascia: No Tendon: No Muscle: No Joint: No Bone: No None N/A N/A Epithelialization: Treatment Notes Wound #10 (Lower Leg) Wound Laterality: Left, Lateral Cleanser Soap and Water Discharge Instruction: May shower and wash wound with dial antibacterial soap and water prior to dressing change. Wound Cleanser Discharge Instruction: Cleanse the wound with wound cleanser prior to applying a clean dressing using gauze sponges, not tissue or cotton balls. Peri-Wound Care Triamcinolone 15 (g) Discharge Instruction: Use triamcinolone .1%15 (g) mixed 50:50 with lotion to lower legs Sween Lotion (Moisturizing lotion) Discharge Instruction: Apply moisturizing lotion as directed Topical Primary Dressing Secondary Dressing Secured With Compression Wrap ThreePress (3 layer compression wrap) Discharge Instruction: Apply three layer compression. may use unna layer at top top secure Compression Stockings Circaid Juxta Lite Compression Wrap Quantity: 1 Left Leg Compression Amount: 30-40 mmHg Discharge Instruction: Apply Circaid Juxta Lite Compression Wrap daily as instructed. Apply first thing  in the morning, remove at night before bed. Add-Ons Wound #11 (Lower Leg) Wound Laterality: Right, Anterior Cleanser Soap and Water Discharge Instruction: May shower and wash wound with dial antibacterial soap and water prior to dressing change. Wound Cleanser Discharge Instruction: Cleanse the wound with wound cleanser prior to applying a clean dressing using gauze sponges, not tissue or cotton balls. Peri-Wound Care Triamcinolone 15 (g) Discharge Instruction: Use triamcinolone .1%15 (g) mixed 50:50 with lotion to lower legs Sween Lotion (Moisturizing lotion) Discharge Instruction: Apply moisturizing lotion as directed Topical Primary Dressing Secondary Dressing Secured With Compression Wrap ThreePress (3 layer compression wrap) Discharge Instruction: Apply three layer compression. may use unna layer at top top secure Compression  Stockings Circaid Juxta Lite Compression Wrap Quantity: 1 Right Leg Compression Amount: 30-40 mmHg Discharge Instruction: Apply Circaid Juxta Lite Compression Wrap daily as instructed. Apply first thing in the morning, remove at night before bed. Add-Ons Wound #12 (Lower Leg) Wound Laterality: Right, Lateral Cleanser Soap and Water Discharge Instruction: May shower and wash wound with dial antibacterial soap and water prior to dressing change. Wound Cleanser Discharge Instruction: Cleanse the wound with wound cleanser prior to applying a clean dressing using gauze sponges, not tissue or cotton balls. Peri-Wound Care Triamcinolone 15 (g) Discharge Instruction: Use triamcinolone .1%15 (g) mixed 50:50 with lotion to lower legs Sween Lotion (Moisturizing lotion) Discharge Instruction: Apply moisturizing lotion as directed Topical Primary Dressing Secondary Dressing Secured With Compression Wrap ThreePress (3 layer compression wrap) Discharge Instruction: Apply three layer compression. may use unna layer at top top secure Compression  Stockings Add-Ons Wound #13 (Gluteus) Wound Laterality: Left Cleanser Wound Cleanser Discharge Instruction: Cleanse the wound with wound cleanser prior to applying a clean dressing using gauze sponges, not tissue or cotton balls. Peri-Wound Care Skin Prep Discharge Instruction: Use skin prep as directed Topical Primary Dressing KerraCel Ag Gelling Fiber Dressing, 2x2 in (silver alginate) Discharge Instruction: Apply silver alginate to wound bed as instructed Secondary Dressing Bordered Gauze, 4x4 in Discharge Instruction: Apply over primary dressing as directed. Secured With Compression Wrap Compression Stockings Environmental education officer) Signed: 07/23/2021 4:34:24 PM By: Linton Ham MD Signed: 07/23/2021 5:22:04 PM By: Deon Pilling RN, BSN Entered By: Linton Ham on 07/23/2021 13:59:36 -------------------------------------------------------------------------------- Multi-Disciplinary Care Plan Details Patient Name: Date of Service: Katrina Amel D. 07/23/2021 12:45 PM Medical Record Number: [id number] Patient Account Number: [id number] Date of Birth/Sex: Treating RN: 1932/05/02 (87 y.o. Katrina [name] Primary Care Abdullah Rizzi: [name] Other Clinician: Referring Ezequias Lard: Treating Alonia Dibuono/Extender: [name] in Treatment: 4 Multidisciplinary Care Plan reviewed with physician Active Inactive Abuse / Safety / Falls / Self Care Management Nursing Diagnoses: Potential for falls Potential for injury related to falls Goals: Patient will remain injury free related to falls Date Initiated: 06/25/2021 Target Resolution Date: 07/24/2021 Goal Status: Active Patient/caregiver will verbalize/demonstrate measures taken to prevent injury and/or falls Date Initiated: 06/25/2021 Target Resolution Date: 07/24/2021 Goal Status: Active Interventions: Assess Activities of Daily Living upon admission and as needed Assess fall risk on  admission and as needed Assess: immobility, friction, shearing, incontinence upon admission and as needed Assess impairment of mobility on admission and as needed per policy Assess personal safety and home safety (as indicated) on admission and as needed Assess self care needs on admission and as needed Provide education on fall prevention Provide education on personal and home safety Notes: Wound/Skin Impairment Nursing Diagnoses: Impaired tissue integrity Knowledge deficit related to ulceration/compromised skin integrity Goals: Patient/caregiver will verbalize understanding of skin care regimen Date Initiated: 06/25/2021 Target Resolution Date: 07/24/2021 Goal Status: Active Interventions: Assess patient/caregiver ability to obtain necessary supplies Assess patient/caregiver ability to perform ulcer/skin care regimen upon admission and as needed Assess ulceration(s) every visit Provide education on ulcer and skin care Notes: Electronic Signature(s) Signed: 07/23/2021 5:20:51 PM By: Rhae Hammock RN Entered By: Rhae Hammock on 07/23/2021 13:57:03 -------------------------------------------------------------------------------- Pain Assessment Details Patient Name: Date of Service: Katrina Amel D. 07/23/2021 12:45 PM Medical Record Number: [id number] Patient Account Number: [id number] Date of Birth/Sex: Treating RN: [birth year] (87 y.o. Katrina [name] Primary Care Elyzabeth Goatley: [name] Other Clinician: Referring Everlina Gotts: Treating Roma Bondar/Extender: [name] in Treatment: 4 Active Problems Location  of Pain Severity and Description of Pain Patient Has Paino No Site Locations Pain Management and Medication Current Pain Management: Electronic Signature(s) Signed: 07/23/2021 5:20:51 PM By: Rhae Hammock RN Entered By: Rhae Hammock on 07/23/2021  13:16:05 -------------------------------------------------------------------------------- Patient/Caregiver Education Details Patient Name: Date of Service: Katrina Amel D. 12/1/2022andnbsp12:45 PM Medical Record Number: [id number] Patient Account Number: [id number] Date of Birth/Gender: Treating RN: Mar 18, 1932 (87 y.o. Katrina [name] Primary Care Physician: [name] Other Clinician: Referring Physician: Treating Physician/Extender: [name] in Treatment: 4 Education Assessment Education Provided To: Patient Education Topics Provided Safety: Methods: Explain/Verbal Responses: Reinforcements needed, State content correctly Wound/Skin Impairment: Electronic Signature(s) Signed: 07/23/2021 5:20:51 PM By: Rhae Hammock RN Entered By: Rhae Hammock on 07/23/2021 13:57:34 -------------------------------------------------------------------------------- Wound Assessment Details Patient Name: Date of Service: Katrina Amel D. 07/23/2021 12:45 PM Medical Record Number: [id number] Patient Account Number: [id number] Date of Birth/Sex: Treating RN: 1931/11/22 (87 y.o. Katrina [name] Primary Care Shakenna Herrero: [name] Other Clinician: Referring Zarrah Loveland: Treating Luv Mish/Extender: [name] in Treatment: 4 Wound Status Wound Number: 10 Primary Etiology: Venous Leg Ulcer Wound Location: Left, Lateral Lower Leg Wound Status: Open Wounding Event: Gradually Appeared Comorbid History: Sleep Apnea, Arrhythmia, Hypertension, Phlebitis Date Acquired: 10/21/2020 Weeks Of Treatment: 4 Clustered Wound: No Photos Wound Measurements Length: (cm) Width: (cm) Depth: (cm) Area: (cm) Volume: (cm) 0 % Reduction in Area: 100% 0 % Reduction in Volume: 100% 0 Epithelialization: Large (67-100%) 0 0 Wound Description Classification: Full Thickness Without Exposed Support Structures Wound Margin: Flat and  Intact Exudate Amount: None Present Foul Odor After Cleansing: No Slough/Fibrino No Wound Bed Granulation Amount: None Present (0%) Exposed Structure Necrotic Amount: None Present (0%) Fascia Exposed: No Fat Layer (Subcutaneous Tissue) Exposed: No Tendon Exposed: No Muscle Exposed: No Joint Exposed: No Bone Exposed: No Treatment Notes Wound #10 (Lower Leg) Wound Laterality: Left, Lateral Cleanser Soap and Water Discharge Instruction: May shower and wash wound with dial antibacterial soap and water prior to dressing change. Wound Cleanser Discharge Instruction: Cleanse the wound with wound cleanser prior to applying a clean dressing using gauze sponges, not tissue or cotton balls. Peri-Wound Care Triamcinolone 15 (g) Discharge Instruction: Use triamcinolone .1%15 (g) mixed 50:50 with lotion to lower legs Sween Lotion (Moisturizing lotion) Discharge Instruction: Apply moisturizing lotion as directed Topical Primary Dressing Secondary Dressing Secured With Compression Wrap ThreePress (3 layer compression wrap) Discharge Instruction: Apply three layer compression. may use unna layer at top top secure Compression Stockings Circaid Juxta Lite Compression Wrap Quantity: 1 Left Leg Compression Amount: 30-40 mmHg Discharge Instruction: Apply Circaid Juxta Lite Compression Wrap daily as instructed. Apply first thing in the morning, remove at night before bed. Add-Ons Electronic Signature(s) Signed: 07/23/2021 5:20:51 PM By: Rhae Hammock RN Signed: 07/23/2021 5:22:04 PM By: Deon Pilling RN, BSN Entered By: Deon Pilling on 07/23/2021 13:19:58 -------------------------------------------------------------------------------- Wound Assessment Details Patient Name: Date of Service: Katrina Amel D. 07/23/2021 12:45 PM Medical Record Number: [id number] Patient Account Number: [id number] Date of Birth/Sex: Treating RN: Apr 15, 1932 (87 y.o. Katrina [name] Primary Care Karyss Frese:  [name] Other Clinician: Referring Danya Spearman: Treating Cephas Revard/Extender: [name] in Treatment: 4 Wound Status Wound Number: 11 Primary Etiology: Venous Leg Ulcer Wound Location: Right, Anterior Lower Leg Wound Status: Open Wounding Event: Gradually Appeared Comorbid History: Sleep Apnea, Arrhythmia, Hypertension, Phlebitis Date Acquired: 10/21/2020 Weeks Of Treatment: 4 Clustered Wound: No Photos Wound Measurements Length: (cm) Width: (cm) Depth: (cm) Area: (cm) Volume: (cm) 0 %  Reduction in Area: 100% 0 % Reduction in Volume: 100% 0 Epithelialization: Large (67-100%) 0 0 Wound Description Classification: Full Thickness Without Exposed Support Structures Wound Margin: Indistinct, nonvisible Exudate Amount: Small Exudate Type: Serous Exudate Color: amber Foul Odor After Cleansing: No Slough/Fibrino No Wound Bed Granulation Amount: None Present (0%) Exposed Structure Necrotic Amount: None Present (0%) Fascia Exposed: No Fat Layer (Subcutaneous Tissue) Exposed: No Tendon Exposed: No Muscle Exposed: No Joint Exposed: No Bone Exposed: No Limited to Skin Breakdown Treatment Notes Wound #11 (Lower Leg) Wound Laterality: Right, Anterior Cleanser Soap and Water Discharge Instruction: May shower and wash wound with dial antibacterial soap and water prior to dressing change. Wound Cleanser Discharge Instruction: Cleanse the wound with wound cleanser prior to applying a clean dressing using gauze sponges, not tissue or cotton balls. Peri-Wound Care Triamcinolone 15 (g) Discharge Instruction: Use triamcinolone .1%15 (g) mixed 50:50 with lotion to lower legs Sween Lotion (Moisturizing lotion) Discharge Instruction: Apply moisturizing lotion as directed Topical Primary Dressing Secondary Dressing Secured With Compression Wrap ThreePress (3 layer compression wrap) Discharge Instruction: Apply three layer compression. may use unna  layer at top top secure Compression Stockings Circaid Juxta Lite Compression Wrap Quantity: 1 Right Leg Compression Amount: 30-40 mmHg Discharge Instruction: Apply Circaid Juxta Lite Compression Wrap daily as instructed. Apply first thing in the morning, remove at night before bed. Add-Ons Electronic Signature(s) Signed: 07/23/2021 5:20:51 PM By: Rhae Hammock RN Signed: 07/23/2021 5:22:04 PM By: Deon Pilling RN, BSN Entered By: Deon Pilling on 07/23/2021 13:20:32 -------------------------------------------------------------------------------- Wound Assessment Details Patient Name: Date of Service: Katrina Amel D. 07/23/2021 12:45 PM Medical Record Number: [id number] Patient Account Number: [id number] Date of Birth/Sex: Treating RN: 1931/11/19 (87 y.o. Katrina [name] Primary Care Zamora Colton: [name] Other Clinician: Referring Vadis Slabach: Treating Keyonda Bickle/Extender: [name] in Treatment: 4 Wound Status Wound Number: 12 Primary Etiology: Venous Leg Ulcer Wound Location: Right, Lateral Lower Leg Wound Status: Open Wounding Event: Gradually Appeared Comorbid History: Sleep Apnea, Arrhythmia, Hypertension, Phlebitis Date Acquired: 10/21/2020 Weeks Of Treatment: 4 Clustered Wound: No Photos Wound Measurements Length: (cm) Width: (cm) Depth: (cm) Area: (cm) Volume: (cm) 0 % Reduction in Area: 100% 0 % Reduction in Volume: 100% 0 Epithelialization: Large (67-100%) 0 0 Wound Description Classification: Full Thickness Without Exposed Support Structures Wound Margin: Flat and Intact Exudate Amount: Small Exudate Type: Serous Exudate Color: amber Foul Odor After Cleansing: No Slough/Fibrino No Wound Bed Granulation Amount: Small (1-33%) Exposed Structure Granulation Quality: Pink Fascia Exposed: No Necrotic Amount: None Present (0%) Fat Layer (Subcutaneous Tissue) Exposed: Yes Tendon Exposed: No Muscle Exposed: No Joint  Exposed: No Bone Exposed: No Treatment Notes Wound #12 (Lower Leg) Wound Laterality: Right, Lateral Cleanser Soap and Water Discharge Instruction: May shower and wash wound with dial antibacterial soap and water prior to dressing change. Wound Cleanser Discharge Instruction: Cleanse the wound with wound cleanser prior to applying a clean dressing using gauze sponges, not tissue or cotton balls. Peri-Wound Care Triamcinolone 15 (g) Discharge Instruction: Use triamcinolone .1%15 (g) mixed 50:50 with lotion to lower legs Sween Lotion (Moisturizing lotion) Discharge Instruction: Apply moisturizing lotion as directed Topical Primary Dressing Secondary Dressing Secured With Compression Wrap ThreePress (3 layer compression wrap) Discharge Instruction: Apply three layer compression. may use unna layer at top top secure Compression Stockings Add-Ons Electronic Signature(s) Signed: 07/23/2021 5:20:51 PM By: Rhae Hammock RN Signed: 07/23/2021 5:22:04 PM By: Deon Pilling RN, BSN Entered By: Deon Pilling on 07/23/2021 13:21:17 -------------------------------------------------------------------------------- Wound Assessment Details Patient  Name: Date of Service: KEILANI, TERRANCE 07/23/2021 12:45 PM Medical Record Number: [id number] Patient Account Number: [id number] Date of Birth/Sex: Treating RN: 1931/09/04 (87 y.o. Katrina [name] Primary Care Varvara Legault: [name] Other Clinician: Referring Phillp Dolores: Treating Donnesha Karg/Extender: [name] in Treatment: 4 Wound Status Wound Number: 13 Primary Etiology: Pressure Ulcer Wound Location: Left Gluteus Wound Status: Open Wounding Event: Gradually Appeared Comorbid History: Sleep Apnea, Arrhythmia, Hypertension, Phlebitis Date Acquired: 10/21/2020 Weeks Of Treatment: 4 Clustered Wound: Yes Photos Wound Measurements Length: (cm) 3 Width: (cm) 4 Depth: (cm) 0.2 Clustered Quantity: 2 Area: (cm)  9.425 Volume: (cm) 1.885 Wound Description Classification: Category/Stage II Wound Margin: Flat and Intact Exudate Amount: Medium Exudate Type: Serosanguineous Exudate Color: red, brown Foul Odor After Cleansing: Slough/Fibrino % Reduction in Area: -2298.2% % Reduction in Volume: -4733.3% Epithelialization: None Tunneling: No Undermining: No No No Wound Bed Granulation Amount: Large (67-100%) Exposed Structure Granulation Quality: Red Fascia Exposed: No Necrotic Amount: None Present (0%) Fat Layer (Subcutaneous Tissue) Exposed: Yes Tendon Exposed: No Muscle Exposed: No Joint Exposed: No Bone Exposed: No Treatment Notes Wound #13 (Gluteus) Wound Laterality: Left Cleanser Wound Cleanser Discharge Instruction: Cleanse the wound with wound cleanser prior to applying a clean dressing using gauze sponges, not tissue or cotton balls. Peri-Wound Care Skin Prep Discharge Instruction: Use skin prep as directed Topical Primary Dressing KerraCel Ag Gelling Fiber Dressing, 2x2 in (silver alginate) Discharge Instruction: Apply silver alginate to wound bed as instructed Secondary Dressing Bordered Gauze, 4x4 in Discharge Instruction: Apply over primary dressing as directed. Secured With Compression Wrap Compression Stockings Environmental education officer) Signed: 07/23/2021 5:20:51 PM By: Rhae Hammock RN Signed: 07/23/2021 5:22:04 PM By: Deon Pilling RN, BSN Entered By: Deon Pilling on 07/23/2021 13:22:05 -------------------------------------------------------------------------------- Vitals Details Patient Name: Date of Service: Katrina Amel D. 07/23/2021 12:45 PM Medical Record Number: [id number] Patient Account Number: [id number] Date of Birth/Sex: Treating RN: May 28, 1932 (87 y.o. Katrina [name] Primary Care Tashema Tiller: [name] Other Clinician: Referring Bambi Fehnel: Treating Eliberto Sole/Extender: [name] in Treatment:  4 Vital Signs Time Taken: 13:17 Temperature (F): 98.7 Height (in): 61 Pulse (bpm): 74 Weight (lbs): 221 Respiratory Rate (breaths/min): 17 Body Mass Index (BMI): 41.8 Blood Pressure (mmHg): 134/74 Reference Range: 80 - 120 mg / dl Electronic Signature(s) Signed: 07/23/2021 5:20:51 PM By: Rhae Hammock RN Entered By: Rhae Hammock on 07/23/2021 13:18:56

## 2021-08-06 ENCOUNTER — Other Ambulatory Visit: Payer: Self-pay

## 2021-08-06 ENCOUNTER — Encounter (HOSPITAL_BASED_OUTPATIENT_CLINIC_OR_DEPARTMENT_OTHER): Payer: Medicare Other | Admitting: Internal Medicine

## 2021-08-06 DIAGNOSIS — I87333 Chronic venous hypertension (idiopathic) with ulcer and inflammation of bilateral lower extremity: Secondary | ICD-10-CM | POA: Diagnosis not present

## 2021-08-06 NOTE — Progress Notes (Signed)
NARYA, BEAVIN ([id number]) Visit Report for 08/06/2021 Arrival Information Details Patient Name: Date of Service: Katrina [name], Katrina D. 08/06/2021 1:00 PM Medical Record Number: [id number] Patient Account Number: [id number] Date of Birth/Sex: Treating RN: 12-04-1931 (87 y.o. Katrina [name] Primary Care Crew Goren: [name] Other Clinician: Referring Zuleika Gallus: Treating Deeksha Cotrell/Extender: [name] in Treatment: 6 Visit Information History Since Last Visit Added or deleted any medications: No Patient Arrived: Katrina [name] Any new allergies or adverse reactions: No Arrival Time: 13:05 Had a fall or experienced change in No Accompanied By: self activities of daily living that may affect Transfer Assistance: Manual risk of falls: Patient Identification Verified: Yes Signs or symptoms of abuse/neglect since last visito No Secondary Verification Process Completed: Yes Hospitalized since last visit: No Patient Requires Transmission-Based Precautions: No Implantable device outside of the clinic excluding No Patient Has Alerts: Yes cellular tissue based products placed in the center Patient Alerts: Patient on Blood Thinner since last visit: Has Dressing in Place as Prescribed: Yes Pain Present Now: Yes Electronic Signature(s) Signed: 08/06/2021 5:32:07 PM By: Baruch Gouty RN, BSN Entered By: Baruch Gouty on 08/06/2021 13:12:04 -------------------------------------------------------------------------------- Clinic Level of Care Assessment Details Patient Name: Date of Service: Katrina Amel D. 08/06/2021 1:00 PM Medical Record Number: [id number] Patient Account Number: [id number] Date of Birth/Sex: Treating RN: 1932/06/04 (87 y.o. Katrina [name] Primary Care Derrica Sieg: [name] Other Clinician: Referring Murlin Schrieber: Treating Chaley Castellanos/Extender: [name] in Treatment: 6 Clinic Level of Care Assessment Items TOOL 4  Quantity Score _0  - 0 Use when only an EandM is performed on FOLLOW-UP visit ASSESSMENTS - Nursing Assessment / Reassessment X- 1 10 Reassessment of Co-morbidities (includes updates in patient status) X- 1 5 Reassessment of Adherence to Treatment Plan ASSESSMENTS - Wound and Skin A ssessment / Reassessment X - Simple Wound Assessment / Reassessment - one wound 1 5 _1  - 0 Complex Wound Assessment / Reassessment - multiple wounds _2  - 0 Dermatologic / Skin Assessment (not related to wound area) ASSESSMENTS - Focused Assessment _3  - 0 Circumferential Edema Measurements - multi extremities _4  - 0 Nutritional Assessment / Counseling / Intervention _5  - 0 Lower Extremity Assessment (monofilament, tuning fork, pulses) _6  - 0 Peripheral Arterial Disease Assessment (using hand held doppler) ASSESSMENTS - Ostomy and/or Continence Assessment and Care _7  - 0 Incontinence Assessment and Management _8  - 0 Ostomy Care Assessment and Management (repouching, etc.) PROCESS - Coordination of Care X - Simple Patient / Family Education for ongoing care 1 15 _9  - 0 Complex (extensive) Patient / Family Education for ongoing care X- 1 10 Staff obtains Programmer, systems, Records, T Results / Process Orders est X- 1 10 Staff telephones HHA, Nursing Homes / Clarify orders / etc _10  - 0 Routine Transfer to another Facility (non-emergent condition) _11  - 0 Routine Hospital Admission (non-emergent condition) _12  - 0 New Admissions / Biomedical engineer / Ordering NPWT Apligraf, etc. , _13  - 0 Emergency Hospital Admission (emergent condition) X- 1 10 Simple Discharge Coordination _14  - 0 Complex (extensive) Discharge Coordination PROCESS - Special Needs _15  - 0 Pediatric / Minor Patient Management _16  - 0 Isolation Patient Management _17  - 0 Hearing / Language / Visual special needs _18  - 0 Assessment of Community assistance (transportation, D/C planning, etc.) _19  - 0 Additional assistance / Altered  mentation _20  - 0 Support Surface(s) Assessment (bed, cushion, seat, etc.) INTERVENTIONS - Wound Cleansing / Measurement X - Simple Wound Cleansing - one wound 1 5 _21  - 0  Complex Wound Cleansing - multiple wounds X- 1 5 Wound Imaging (photographs - any number of wounds) _0  - 0 Wound Tracing (instead of photographs) X- 1 5 Simple Wound Measurement - one wound _1  - 0 Complex Wound Measurement - multiple wounds INTERVENTIONS - Wound Dressings X - Small Wound Dressing one or multiple wounds 1 10 _2  - 0 Medium Wound Dressing one or multiple wounds _3  - 0 Large Wound Dressing one or multiple wounds X- 1 5 Application of Medications - topical <[id number]>_6<\/[id number]>_8  - 0 Application of Medications - injection INTERVENTIONS - Miscellaneous _5  - 0 External ear exam _6  - 0 Specimen Collection (cultures, biopsies, blood, body fluids, etc.) _7  - 0 Specimen(s) / Culture(s) sent or taken to Lab for analysis _8  - 0 Patient Transfer (multiple staff / Civil Service fast streamer / Similar devices) _9  - 0 Simple Staple / Suture removal (25 or less) _10  - 0 Complex Staple / Suture removal (26 or more) _11  - 0 Hypo / Hyperglycemic Management (close monitor of Blood Glucose) _12  - 0 Ankle / Brachial Index (ABI) - do not check if billed separately X- 1 5 Vital Signs Has the patient been seen at the hospital within the last three years: Yes Total Score: 100 Level Of Care: New/Established - Level 3 Electronic Signature(s) Signed: 08/06/2021 5:32:07 PM By: Baruch Gouty RN, BSN Entered By: Baruch Gouty on 08/06/2021 13:35:41 -------------------------------------------------------------------------------- Encounter Discharge Information Details Patient Name: Date of Service: Katrina Amel D. 08/06/2021 1:00 PM Medical Record Number: [id number] Patient Account Number: [id number] Date of Birth/Sex: Treating RN: 18-Nov-1931 (87 y.o. Katrina [name] Primary Care Anish Vana: [name] Other Clinician: Referring  Shamonica Schadt: Treating Marvette Schamp/Extender: [name] in Treatment: 6 Encounter Discharge Information Items Discharge Condition: Stable Ambulatory Status: Cane Discharge Destination: Home Transportation: Other Accompanied By: self Schedule Follow-up Appointment: Yes Clinical Summary of Care: Patient Declined Notes transportation service Electronic Signature(s) Signed: 08/06/2021 5:32:07 PM By: Baruch Gouty RN, BSN Entered By: Baruch Gouty on 08/06/2021 13:45:37 -------------------------------------------------------------------------------- Lower Extremity Assessment Details Patient Name: Date of Service: Katrina Amel D. 08/06/2021 1:00 PM Medical Record Number: [id number] Patient Account Number: [id number] Date of Birth/Sex: Treating RN: 15-Nov-1931 (87 y.o. Katrina [name] Primary Care Danni Leabo: [name] Other Clinician: Referring Treazure Nery: Treating Jozeph Persing/Extender: [name] in Treatment: 6 Electronic Signature(s) Signed: 08/06/2021 5:32:07 PM By: Baruch Gouty RN, BSN Entered By: Baruch Gouty on 08/06/2021 13:13:12 -------------------------------------------------------------------------------- Multi Wound Chart Details Patient Name: Date of Service: Katrina Amel D. 08/06/2021 1:00 PM Medical Record Number: [id number] Patient Account Number: [id number] Date of Birth/Sex: Treating RN: 04/17/1932 (87 y.o. [name], Meta.Reding Primary Care Lakeidra Reliford: [name] Other Clinician: Referring Emaya Preston: Treating Shianne Zeiser/Extender: [name] in Treatment: 6 Vital Signs Height(in): 59 Pulse(bpm): 79 Weight(lbs): 221 Blood Pressure(mmHg): 183/71 Body Mass Index(BMI): 42 Temperature(F): 98.1 Respiratory Rate(breaths/min): 18 Photos: [10:No Photos Left, Lateral Lower Leg] [11:No Photos Right, Anterior Lower Leg] [12:No Photos Right, Lateral Lower Leg] Wound Location:  [10:Gradually Appeared] [11:Gradually Appeared] [12:Gradually Appeared] Wounding Event: [10:Venous Leg Ulcer] [11:Venous Leg Ulcer] [12:Venous Leg Ulcer] Primary Etiology: [10:N/A] [11:N/A] [12:N/A] Comorbid History: [10:10/21/2020] [11:10/21/2020] [12:10/21/2020] Date Acquired: [10:6] [11:6] [12:6] Weeks of Treatment: [10:Healed - Epithelialized] [11:Healed - Epithelialized] [12:Healed - Epithelialized] Wound Status: [10:No] [11:No] [12:No] Clustered Wound: [10:N/A] [11:N/A] [12:N/A] Clustered Quantity: [10:0x0x0] [11:0x0x0] [12:0x0x0] Measurements L x W x D (cm) [10:0] [11:0] [12:0] A (cm) : rea [10:0] [11:0] [12:0] Volume (cm) : [10:100.00%] [11:100.00%] [12:100.00%] % Reduction in A rea: [10:100.00%] [11:100.00%] [  12:100.00%] % Reduction in Volume: [10:Full Thickness Without Exposed] [11:Full Thickness Without Exposed] [12:Full Thickness Without Exposed] Classification: [10:Support Structures None Present] [11:Support Structures Small] [12:Support Structures Small] Exudate A mount: [10:N/A] [11:Serous] [12:Serous] Exudate Type: [10:N/A] [11:amber] [12:amber] Exudate Color: [10:N/A] [11:N/A] [12:N/A] Wound Margin: [10:N/A] [11:N/A] [12:N/A] Granulation A mount: [10:N/A] [11:N/A] [12:N/A] Granulation Quality: [10:N/A] [11:N/A] [12:N/A] Necrotic A mount: [10:N/A] [11:N/A] [12:N/A] Wound Number: 13 N/A N/A Photos: No Photos N/A N/A Left Gluteus N/A N/A Wound Location: Gradually Appeared N/A N/A Wounding Event: Pressure Ulcer N/A N/A Primary Etiology: Sleep Apnea, Arrhythmia, N/A N/A Comorbid History: Hypertension, Phlebitis 10/21/2020 N/A N/A Date Acquired: 6 N/A N/A Weeks of Treatment: Open N/A N/A Wound Status: Yes N/A N/A Clustered Wound: 2 N/A N/A Clustered Quantity: 0.4x1.5x0.1 N/A N/A Measurements L x W x D (cm) 0.471 N/A N/A A (cm) : rea 0.047 N/A N/A Volume (cm) : -19.80% N/A N/A % Reduction in A rea: -20.50% N/A N/A % Reduction in Volume: Category/Stage II  N/A N/A Classification: Medium N/A N/A Exudate A mount: Serosanguineous N/A N/A Exudate Type: red, brown N/A N/A Exudate Color: Flat and Intact N/A N/A Wound Margin: Large (67-100%) N/A N/A Granulation A mount: Red N/A N/A Granulation Quality: None Present (0%) N/A N/A Necrotic A mount: Fat Layer (Subcutaneous Tissue): Yes N/A N/A Exposed Structures: Fascia: No Tendon: No Muscle: No Joint: No Bone: No Large (67-100%) N/A N/A Epithelialization: Treatment Notes Electronic Signature(s) Signed: 08/06/2021 4:39:12 PM By: Linton Ham MD Signed: 08/06/2021 5:30:53 PM By: Deon Pilling RN, BSN Entered By: Linton Ham on 08/06/2021 13:37:35 -------------------------------------------------------------------------------- Multi-Disciplinary Care Plan Details Patient Name: Date of Service: Katrina Amel D. 08/06/2021 1:00 PM Medical Record Number: [id number] Patient Account Number: [id number] Date of Birth/Sex: Treating RN: 11-Jan-1932 (87 y.o. Katrina [name] Primary Care Seletha Zimmermann: [name] Other Clinician: Referring Gaylen Pereira: Treating Farris Geiman/Extender: [name] in Treatment: [place] reviewed with physician Active Inactive Abuse / Safety / Falls / Self Care Management Nursing Diagnoses: Potential for falls Potential for injury related to falls Goals: Patient will remain injury free related to falls Date Initiated: 06/25/2021 Date Inactivated: 08/06/2021 Target Resolution Date: 07/24/2021 Goal Status: Met Patient/caregiver will verbalize/demonstrate measures taken to prevent injury and/or falls Date Initiated: 06/25/2021 Target Resolution Date: 08/21/2021 Goal Status: Active Interventions: Assess Activities of Daily Living upon admission and as needed Assess fall risk on admission and as needed Assess: immobility, friction, shearing, incontinence upon admission and as needed Assess impairment of  mobility on admission and as needed per policy Assess personal safety and home safety (as indicated) on admission and as needed Assess self care needs on admission and as needed Provide education on fall prevention Provide education on personal and home safety Notes: Wound/Skin Impairment Nursing Diagnoses: Impaired tissue integrity Knowledge deficit related to ulceration/compromised skin integrity Goals: Patient/caregiver will verbalize understanding of skin care regimen Date Initiated: 06/25/2021 Target Resolution Date: 08/21/2021 Goal Status: Active Interventions: Assess patient/caregiver ability to obtain necessary supplies Assess patient/caregiver ability to perform ulcer/skin care regimen upon admission and as needed Assess ulceration(s) every visit Provide education on ulcer and skin care Notes: Electronic Signature(s) Signed: 08/06/2021 5:32:07 PM By: Baruch Gouty RN, BSN Entered By: Baruch Gouty on 08/06/2021 13:23:12 -------------------------------------------------------------------------------- Pain Assessment Details Patient Name: Date of Service: Katrina Amel D. 08/06/2021 1:00 PM Medical Record Number: [id number] Patient Account Number: [id number] Date of Birth/Sex: Treating RN: 09-Jul-1932 (87 y.o. Katrina [name] Primary Care Minnette Merida: [name] Other Clinician: Referring Kaicen Desena: Treating Laria Grimmett/Extender:  [name] in Treatment: 6 Active Problems Location of Pain Severity and Description of Pain Patient Has Paino Yes Site Locations Pain Location: Generalized Pain, Pain in Ulcers With Dressing Change: Yes Rate the pain. Current Pain Level: 4 Character of Pain Describe the Pain: Aching, Tender Pain Management and Medication Current Pain Management: Medication: No Cold Application: No Rest: No Massage: No Activity: No T.E.N.S.: No Heat Application: No Leg drop or elevation: No Is the Current Pain  Management Adequate: Inadequate How does your wound impact your activities of daily livingo Sleep: No Bathing: No Appetite: No Relationship With Others: No Bladder Continence: No Emotions: No Bowel Continence: No Work: No Toileting: No Drive: No Dressing: No Hobbies: No Electronic Signature(s) Signed: 08/06/2021 5:32:07 PM By: Baruch Gouty RN, BSN Entered By: Baruch Gouty on 08/06/2021 13:13:04 -------------------------------------------------------------------------------- Patient/Caregiver Education Details Patient Name: Date of Service: Katrina Amel D. 12/15/2022andnbsp1:00 PM Medical Record Number: [id number] Patient Account Number: [id number] Date of Birth/Gender: Treating RN: 06-22-1932 (87 y.o. Katrina [name] Primary Care Physician: [name] Other Clinician: Referring Physician: Treating Physician/Extender: [name] in Treatment: 6 Education Assessment Education Provided To: Patient Education Topics Provided Pressure: Methods: Explain/Verbal Responses: Reinforcements needed, State content correctly Wound/Skin Impairment: Methods: Explain/Verbal Responses: Reinforcements needed, State content correctly Electronic Signature(s) Signed: 08/06/2021 5:32:07 PM By: Baruch Gouty RN, BSN Entered By: Baruch Gouty on 08/06/2021 13:23:42 -------------------------------------------------------------------------------- Wound Assessment Details Patient Name: Date of Service: Katrina Amel D. 08/06/2021 1:00 PM Medical Record Number: [id number] Patient Account Number: [id number] Date of Birth/Sex: Treating RN: 22-Jan-1932 (87 y.o. Katrina [name] Primary Care Klaus Casteneda: [name] Other Clinician: Referring Deane Wattenbarger: Treating Judia Arnott/Extender: [name] in Treatment: 6 Wound Status Wound Number: 10 Primary Etiology: Venous Leg Ulcer Wound Location: Left, Lateral Lower Leg Wound  Status: Healed - Epithelialized Wounding Event: Gradually Appeared Date Acquired: 10/21/2020 Weeks Of Treatment: 6 Clustered Wound: No Wound Measurements Length: (cm) Width: (cm) Depth: (cm) Area: (cm) Volume: (cm) 0 % Reduction in Area: 100% 0 % Reduction in Volume: 100% 0 0 0 Wound Description Classification: Full Thickness Without Exposed Support Structu Exudate Amount: None Present res Treatment Notes Wound #10 (Lower Leg) Wound Laterality: Left, Lateral Cleanser Peri-Wound Care Topical Primary Dressing Secondary Dressing Secured With Compression Wrap Compression Stockings Add-Ons Electronic Signature(s) Signed: 08/06/2021 5:32:07 PM By: Baruch Gouty RN, BSN Entered By: Baruch Gouty on 08/06/2021 13:05:47 -------------------------------------------------------------------------------- Wound Assessment Details Patient Name: Date of Service: Katrina Amel D. 08/06/2021 1:00 PM Medical Record Number: [id number] Patient Account Number: [id number] Date of Birth/Sex: Treating RN: Nov 28, 1931 (87 y.o. Katrina [name] Primary Care Donyelle Enyeart: [name] Other Clinician: Referring Nakeeta Sebastiani: Treating Lacee Grey/Extender: [name] in Treatment: 6 Wound Status Wound Number: 11 Primary Etiology: Venous Leg Ulcer Wound Location: Right, Anterior Lower Leg Wound Status: Healed - Epithelialized Wounding Event: Gradually Appeared Date Acquired: 10/21/2020 Weeks Of Treatment: 6 Clustered Wound: No Wound Measurements Length: (cm) Width: (cm) Depth: (cm) Area: (cm) Volume: (cm) 0 % Reduction in Area: 100% 0 % Reduction in Volume: 100% 0 0 0 Wound Description Classification: Full Thickness Without Exposed Support Structu Exudate Amount: Small Exudate Type: Serous Exudate Color: amber res Treatment Notes Wound #11 (Lower Leg) Wound Laterality: Right, Anterior Cleanser Peri-Wound Care Topical Primary Dressing Secondary  Dressing Secured With Compression Wrap Compression Stockings Add-Ons Electronic Signature(s) Signed: 08/06/2021 5:32:07 PM By: Baruch Gouty RN, BSN Entered By: Baruch Gouty on 08/06/2021 13:05:48 -------------------------------------------------------------------------------- Wound Assessment Details Patient Name:  Date of Service: Katrina [name], Katrina D. 08/06/2021 1:00 PM Medical Record Number: [id number] Patient Account Number: [id number] Date of Birth/Sex: Treating RN: 03/10/1932 (87 y.o. Katrina [name] Primary Care Kloee Ballew: [name] Other Clinician: Referring Demarko Zeimet: Treating Viona Hosking/Extender: [name] in Treatment: 6 Wound Status Wound Number: 12 Primary Etiology: Venous Leg Ulcer Wound Location: Right, Lateral Lower Leg Wound Status: Healed - Epithelialized Wounding Event: Gradually Appeared Date Acquired: 10/21/2020 Weeks Of Treatment: 6 Clustered Wound: No Wound Measurements Length: (cm) Width: (cm) Depth: (cm) Area: (cm) Volume: (cm) 0 % Reduction in Area: 100% 0 % Reduction in Volume: 100% 0 0 0 Wound Description Classification: Full Thickness Without Exposed Support Structu Exudate Amount: Small Exudate Type: Serous Exudate Color: amber res Treatment Notes Wound #12 (Lower Leg) Wound Laterality: Right, Lateral Cleanser Peri-Wound Care Topical Primary Dressing Secondary Dressing Secured With Compression Wrap Compression Stockings Add-Ons Electronic Signature(s) Signed: 08/06/2021 5:32:07 PM By: Baruch Gouty RN, BSN Entered By: Baruch Gouty on 08/06/2021 13:05:48 -------------------------------------------------------------------------------- Wound Assessment Details Patient Name: Date of Service: Katrina Amel D. 08/06/2021 1:00 PM Medical Record Number: [id number] Patient Account Number: [id number] Date of Birth/Sex: Treating RN: June 10, 1932 (87 y.o. Katrina [name] Primary Care Li Fragoso:  [name] Other Clinician: Referring Keely Drennan: Treating Thinh Cuccaro/Extender: [name] in Treatment: 6 Wound Status Wound Number: 13 Primary Etiology: Pressure Ulcer Wound Location: Left Gluteus Wound Status: Open Wounding Event: Gradually Appeared Comorbid History: Sleep Apnea, Arrhythmia, Hypertension, Phlebitis Date Acquired: 10/21/2020 Weeks Of Treatment: 6 Clustered Wound: Yes Photos Wound Measurements Length: (cm) 0.4 Width: (cm) 1.5 Depth: (cm) 0.1 Clustered Quantity: 2 Area: (cm) 0.471 Volume: (cm) 0.047 % Reduction in Area: -19.8% % Reduction in Volume: -20.5% Epithelialization: Large (67-100%) Tunneling: No Undermining: No Wound Description Classification: Category/Stage II Wound Margin: Flat and Intact Exudate Amount: Medium Exudate Type: Serosanguineous Exudate Color: red, brown Foul Odor After Cleansing: No Slough/Fibrino No Wound Bed Granulation Amount: Large (67-100%) Exposed Structure Granulation Quality: Red Fascia Exposed: No Necrotic Amount: None Present (0%) Fat Layer (Subcutaneous Tissue) Exposed: Yes Tendon Exposed: No Muscle Exposed: No Joint Exposed: No Bone Exposed: No Treatment Notes Wound #13 (Gluteus) Wound Laterality: Left Cleanser Wound Cleanser Discharge Instruction: Cleanse the wound with wound cleanser prior to applying a clean dressing using gauze sponges, not tissue or cotton balls. Peri-Wound Care Zinc Oxide Ointment 30g tube Discharge Instruction: Apply Zinc Oxide to irritated buttocks to keep urine off the skin Topical Primary Dressing KerraCel Ag Gelling Fiber Dressing, 2x2 in (silver alginate) Discharge Instruction: Apply silver alginate to wound bed as instructed Secondary Dressing Bordered Gauze, 4x4 in Discharge Instruction: Apply over primary dressing as directed. Secured With Compression Wrap Compression Stockings Environmental education officer) Signed: 08/06/2021 3:39:25  PM By: Valeria Batman EMT Signed: 08/06/2021 5:32:07 PM By: Baruch Gouty RN, BSN Entered By: Valeria Batman on 08/06/2021 15:39:24 -------------------------------------------------------------------------------- Vitals Details Patient Name: Date of Service: Katrina Amel D. 08/06/2021 1:00 PM Medical Record Number: [id number] Patient Account Number: [id number] Date of Birth/Sex: Treating RN: 1931/11/14 (87 y.o. Katrina [name] Primary Care Kailan Laws: [name] Other Clinician: Referring Darria Corvera: Treating Vadhir Mcnay/Extender: [name] in Treatment: 6 Vital Signs Time Taken: 13:12 Temperature (F): 98.1 Height (in): 61 Pulse (bpm): 80 Weight (lbs): 221 Respiratory Rate (breaths/min): 18 Body Mass Index (BMI): 41.8 Blood Pressure (mmHg): 183/71 Reference Range: 80 - 120 mg / dl Electronic Signature(s) Signed: 08/06/2021 5:32:07 PM By: Baruch Gouty RN, BSN Entered By: Baruch Gouty on 08/06/2021 13:12:28

## 2021-08-06 NOTE — Progress Notes (Signed)
Katrina [name], Katrina [name] ([id number]) Visit Report for 08/06/2021 HPI Details Patient Name: Date of Service: Katrina [name], Katrina D. 08/06/2021 1:00 PM Medical Record Number: [id number] Patient Account Number: [id number] Date of Birth/Sex: Treating RN: 1931-12-24 (87 y.o. Katrina [name] Primary Care Provider: [name] Other Clinician: Referring Provider: Treating Provider/Extender: [name] in Treatment: 6 History of Present Illness Location: right lower extremity Quality: Patient reports experiencing a dull pain to affected area(s). Severity: Patient states wound(s) are getting better. Duration: Patient has had the wound for 3 months prior to presenting for treatment Context: The wound would happen gradually Modifying Factors: Patient wound(s)/ulcer(s) are worsening due to :standing for a long while. HPI Description: Past medical history of pulmonary emboli, hypothyroidism, hyperlipidemia, hypertension, sleep apnea, gallstones, ulcerated lower extremities, atrial fibrillation, status post total knee replacement, cholecystectomy, cystoscopy and stent placement for kidney stones. She has never been a smoker. About a year ago she was seen by [name] with an ulcer of her right lower extremity which she's had for about over 3 months. On 10/23/14 -- Lower extremity venous duplex evaluation. She had no evidence of deep venous thromboses involving both lower extremities. There was evidence of deep vein reflux in the right and left common femoral veins. She also had evidence of great saphenous vein reflux noted only at the level of the saphenofemoral junction. No evidence of greater or small saphenous vein reflux. She will need a consult to the vascular surgeon. Seen by Dr. Mathis Fare office note dated 11/13/2014. He noted that she had good arterial blood flow with palpable DP pulses bilaterally. Based on the patient's history Dr. Scot Dock recommended elevation when at rest of both feet  and knees and ankles above heart level in the supine position. Activity such as walking and pool exercises were recommended. She will also have 15-20 mm thigh-high compression stockings daily once the ulcers have completely healed. A prescription was given to her. 11/04/2015 -- the wound on her right lower extremity has completely healed and her edema has gone down significantly. However in her popliteal fossa where she's got a lot of fungal infection there is an area which is now an open wound and this will need attention. 12/02/2015 -- the right lower extremity looks much better than the edema has gone down significantly however she continues to have significant edema on the left side today. 12/09/2015 -- the patient has been doing her dressings as advised and says that overall she feels much better. She has received her juxta light for the left lower extremity READMISSION 06/25/2021 This is a patient who is actually had several previous visits to our clinic in 2014, 2016 and more recently 2017. This was largely because of wounds on her lower legs in the setting of chronic venous insufficiency and secondary lymphedema. Looking at my records she was apparently prescribed thigh-high compression stockings probably by vein and vascular although she comes back in not having worn any stockings in quite a period of time. She is now in the assisted living part of [facility]. She thinks her wounds have been there since April. This includes the left lateral, right anterior and right posterior lateral parts of both lower legs. She also has a stage II pressure ulcer on her left glued I think which is happened because of sleeping in a lift chair. She had ABDs and kerlix on her bilateral lower legs. Past medical history reviewed she has hypothyroidism, left buttock pressure ulcer dating back to March  of this year, iron deficiency, falls, paroxysmal atrial fibrillation  anticoagulated and chronic venous insufficiency ABIs in our clinic were 0.86 on the right and 0.96 on the left 11/10; patient comes into clinic today after being admitted last week with uncontrolled lymphedema, severe venous insufficiency and some superficial wounds on her bilateral lower legs. We put her in 3 layer compression and she comes in today with all the wounds closed and considerable improvement in the condition of her lower legs. The patient is going to need lifelong compression stockings probably juxta lites and we are going to go ahead and order those for her today. Unfortunately the POA here is a niece in [place] were probably going to have to talk with her to arrange proper payment for the juxta lite stockings. She is in the assisted living level of [facility]. She does not feel she can get the stockings on independently I am hopeful that the nurse and aids at the assisted living can help her with this otherwise this may be a problem. She also has an area on her left buttock which is a stage II wound. A lot of unhealthy irritated skin around this I think from moisture prolonged sittingo Incontinence 11/17; the patient's area on the left buttock is just about closed although there is still a lot of unhealthy looking discolored skin around a large area here. I suspect this is chronic moisture, pressure, perhaps incontinence. she has minuscule open areas on her bilateral lower legs. Apparently our intermediary did not get the order for juxta lite stockings therefore she does not have any. 12/1; both leg wounds are healed and she has been wearing bilateral juxta lite stockings for 2 days. [facility] her with this. The left buttock wound unfortunately has deteriorated 12/15; 2-week follow-up. Her legs remain healed she has the other area which is on her left buttock. This is surrounded circumferentially by chronically irritated skin. Mirror image  on the right side as well. I suspect this is chronic pressure friction and moisture from incontinence. This makes it difficult to get this area to completely close. We have been using silver alginate Electronic Signature(s) Signed: 08/06/2021 4:39:12 PM By: Linton Ham MD Signed: 08/06/2021 4:39:12 PM By: Linton Ham MD Entered By: Linton Ham on 08/06/2021 13:39:20 -------------------------------------------------------------------------------- Physical Exam Details Patient Name: Date of Service: Katrina Amel D. 08/06/2021 1:00 PM Medical Record Number: [id number] Patient Account Number: [id number] Date of Birth/Sex: Treating RN: Feb 28, 1932 (87 y.o. Katrina [name] Primary Care Provider: [name] Other Clinician: Referring Provider: Treating Provider/Extender: [name] in Treatment: 6 Constitutional Patient is hypertensive.. Pulse regular and within target range for patient.Marland Kitchen Respirations regular, non-labored and within target range.. Temperature is normal and within the target range for the patient.Marland Kitchen Appears in no distress. Notes Wound exam; the patient has a protruding area on the left buttock. This is not nodular and there is no subcutaneous involvement. Chronically irritated macerated skin on this side and also the right side. No active cellulitis Electronic Signature(s) Signed: 08/06/2021 4:39:12 PM By: Linton Ham MD Entered By: Linton Ham on 08/06/2021 13:42:54 -------------------------------------------------------------------------------- Physician Orders Details Patient Name: Date of Service: Katrina Amel D. 08/06/2021 1:00 PM Medical Record Number: [id number] Patient Account Number: [id number] Date of Birth/Sex: Treating RN: 04-21-1932 (87 y.o. Katrina [name] Primary Care Provider: [name] Other Clinician: Referring Provider: Treating Provider/Extender: [name] in  Treatment: 6 Verbal / Phone Orders: No  Diagnosis Coding ICD-10 Coding Code Description I87.333 Chronic venous hypertension (idiopathic) with ulcer and inflammation of bilateral lower extremity I89.0 Lymphedema, not elsewhere classified L89.322 Pressure ulcer of left buttock, stage 2 L97.821 Non-pressure chronic ulcer of other part of left lower leg limited to breakdown of skin L97.811 Non-pressure chronic ulcer of other part of right lower leg limited to breakdown of skin Follow-up Appointments ppointment in 2 weeks. - with Dr. Dellia Nims Return A Bathing/ Shower/ Hygiene May shower and wash wound with soap and water. Edema Control - Lymphedema / SCD / Other Elevate legs to the level of the heart or above for 30 minutes daily and/or when sitting, a frequency of: - throughout the day Avoid standing for long periods of time. Exercise regularly Moisturize legs daily. Compression stocking or Garment 20-30 mm/Hg pressure to: - apply juxtalite compression garments daily to lower legs Off-Loading Turn and reposition every 2 hours Other: - stand at least every hour during the day Wound Treatment Wound #13 - Gluteus Wound Laterality: Left Cleanser: Wound Cleanser 1 x Per Day/15 Days Discharge Instructions: Cleanse the wound with wound cleanser prior to applying a clean dressing using gauze sponges, not tissue or cotton balls. Peri-Wound Care: Zinc Oxide Ointment 30g tube 1 x Per Day/15 Days Discharge Instructions: Apply Zinc Oxide to irritated buttocks to keep urine off the skin Prim Dressing: KerraCel Ag Gelling Fiber Dressing, 2x2 in (silver alginate) 1 x Per Day/15 Days ary Discharge Instructions: Apply silver alginate to wound bed as instructed Secondary Dressing: Bordered Gauze, 4x4 in 1 x Per Day/15 Days Discharge Instructions: Apply over primary dressing as directed. Electronic Signature(s) Signed: 08/06/2021 4:39:12 PM By: Linton Ham MD Signed: 08/06/2021 5:32:07 PM By: Baruch Gouty RN, BSN Entered By: Baruch Gouty on 08/06/2021 13:37:11 -------------------------------------------------------------------------------- Problem List Details Patient Name: Date of Service: Katrina Amel D. 08/06/2021 1:00 PM Medical Record Number: [id number] Patient Account Number: [id number] Date of Birth/Sex: Treating RN: 1932/01/02 (87 y.o. Katrina [name], Katrina [name] Primary Care Provider: [name] Other Clinician: Referring Provider: Treating Provider/Extender: [name] in Treatment: 6 Active Problems ICD-10 Encounter Code Description Active Date MDM Diagnosis I87.333 Chronic venous hypertension (idiopathic) with ulcer and inflammation of 06/25/2021 No Yes bilateral lower extremity I89.0 Lymphedema, not elsewhere classified 06/25/2021 No Yes L89.322 Pressure ulcer of left buttock, stage 2 06/25/2021 No Yes L97.821 Non-pressure chronic ulcer of other part of left lower leg limited to breakdown 06/25/2021 No Yes of skin L97.811 Non-pressure chronic ulcer of other part of right lower leg limited to breakdown 06/25/2021 No Yes of skin Inactive Problems Resolved Problems Electronic Signature(s) Signed: 08/06/2021 4:39:12 PM By: Linton Ham MD Entered By: Linton Ham on 08/06/2021 13:37:26 -------------------------------------------------------------------------------- Progress Note Details Patient Name: Date of Service: Katrina Amel D. 08/06/2021 1:00 PM Medical Record Number: [id number] Patient Account Number: [id number] Date of Birth/Sex: Treating RN: Oct 05, 1931 (87 y.o. Katrina [name] Primary Care Provider: [name] Other Clinician: Referring Provider: Treating Provider/Extender: [name] in Treatment: 6 Subjective History of Present Illness (HPI) The following HPI elements were documented for the patient's wound: Location: right lower extremity Quality: Patient reports experiencing a dull  pain to affected area(s). Severity: Patient states wound(s) are getting better. Duration: Patient has had the wound for 3 months prior to presenting for treatment Context: The wound would happen gradually Modifying Factors: Patient wound(s)/ulcer(s) are worsening due to :standing for a long while. Past medical history of pulmonary emboli, hypothyroidism, hyperlipidemia, hypertension, sleep apnea, gallstones, ulcerated  lower extremities, atrial fibrillation, status post total knee replacement, cholecystectomy, cystoscopy and stent placement for kidney stones. She has never been a smoker. About a year ago she was seen by [name] with an ulcer of her right lower extremity which she's had for about over 3 months. On 10/23/14 -- Lower extremity venous duplex evaluation. She had no evidence of deep venous thromboses involving both lower extremities. There was evidence of deep vein reflux in the right and left common femoral veins. She also had evidence of great saphenous vein reflux noted only at the level of the saphenofemoral junction. No evidence of greater or small saphenous vein reflux. She will need a consult to the vascular surgeon. Seen by Dr. Mathis Fare office note dated 11/13/2014. He noted that she had good arterial blood flow with palpable DP pulses bilaterally. Based on the patient's history Dr. Scot Dock recommended elevation when at rest of both feet and knees and ankles above heart level in the supine position. Activity such as walking and pool exercises were recommended. She will also have 15-20 mm thigh-high compression stockings daily once the ulcers have completely healed. A prescription was given to her. 11/04/2015 -- the wound on her right lower extremity has completely healed and her edema has gone down significantly. However in her popliteal fossa where she's got a lot of fungal infection there is an area which is now an open wound and this will need attention. 12/02/2015 -- the right lower  extremity looks much better than the edema has gone down significantly however she continues to have significant edema on the left side today. 12/09/2015 -- the patient has been doing her dressings as advised and says that overall she feels much better. She has received her juxta light for the left lower extremity READMISSION 06/25/2021 This is a patient who is actually had several previous visits to our clinic in 2014, 2016 and more recently 2017. This was largely because of wounds on her lower legs in the setting of chronic venous insufficiency and secondary lymphedema. Looking at my records she was apparently prescribed thigh-high compression stockings probably by vein and vascular although she comes back in not having worn any stockings in quite a period of time. She is now in the assisted living part of [facility]. She thinks her wounds have been there since April. This includes the left lateral, right anterior and right posterior lateral parts of both lower legs. She also has a stage II pressure ulcer on her left glued I think which is happened because of sleeping in a lift chair. She had ABDs and kerlix on her bilateral lower legs. Past medical history reviewed she has hypothyroidism, left buttock pressure ulcer dating back to March of this year, iron deficiency, falls, paroxysmal atrial fibrillation anticoagulated and chronic venous insufficiency ABIs in our clinic were 0.86 on the right and 0.96 on the left 11/10; patient comes into clinic today after being admitted last week with uncontrolled lymphedema, severe venous insufficiency and some superficial wounds on her bilateral lower legs. We put her in 3 layer compression and she comes in today with all the wounds closed and considerable improvement in the condition of her lower legs. The patient is going to need lifelong compression stockings probably juxta lites and we are going to go ahead and order those for  her today. Unfortunately the POA here is a niece in [place] were probably going to have to talk with her to arrange proper payment for the juxta  lite stockings. She is in the assisted living level of [facility]. She does not feel she can get the stockings on independently I am hopeful that the nurse and aids at the assisted living can help her with this otherwise this may be a problem. She also has an area on her left buttock which is a stage II wound. A lot of unhealthy irritated skin around this I think from moisture prolonged sittingo Incontinence 11/17; the patient's area on the left buttock is just about closed although there is still a lot of unhealthy looking discolored skin around a large area here. I suspect this is chronic moisture, pressure, perhaps incontinence. she has minuscule open areas on her bilateral lower legs. Apparently our intermediary did not get the order for juxta lite stockings therefore she does not have any. 12/1; both leg wounds are healed and she has been wearing bilateral juxta lite stockings for 2 days. [facility] are [facility] her with this. The left buttock wound unfortunately has deteriorated 12/15; 2-week follow-up. Her legs remain healed she has the other area which is on her left buttock. This is surrounded circumferentially by chronically irritated skin. Mirror image on the right side as well. I suspect this is chronic pressure friction and moisture from incontinence. This makes it difficult to get this area to completely close. We have been using silver alginate Objective Constitutional Patient is hypertensive.. Pulse regular and within target range for patient.Marland Kitchen Respirations regular, non-labored and within target range.. Temperature is normal and within the target range for the patient.Marland Kitchen Appears in no distress. Vitals Time Taken: 1:12 PM, Height: 61 in, Weight: 221 lbs, BMI: 41.8, Temperature: 98.1 F, Pulse: 80 bpm,  Respiratory Rate: 18 breaths/min, Blood Pressure: 183/71 mmHg. General Notes: Wound exam; the patient has a protruding area on the left buttock. This is not nodular and there is no subcutaneous involvement. Chronically irritated macerated skin on this side and also the right side. No active cellulitis Integumentary (Hair, Skin) Wound #10 status is Healed - Epithelialized. Original cause of wound was Gradually Appeared. The date acquired was: 10/21/2020. The wound has been in treatment 6 weeks. The wound is located on the Left,Lateral Lower Leg. The wound measures 0cm length x 0cm width x 0cm depth; 0cm^2 area and 0cm^3 volume. There is a none present amount of drainage noted. Wound #11 status is Healed - Epithelialized. Original cause of wound was Gradually Appeared. The date acquired was: 10/21/2020. The wound has been in treatment 6 weeks. The wound is located on the Right,Anterior Lower Leg. The wound measures 0cm length x 0cm width x 0cm depth; 0cm^2 area and 0cm^3 volume. There is a small amount of serous drainage noted. Wound #12 status is Healed - Epithelialized. Original cause of wound was Gradually Appeared. The date acquired was: 10/21/2020. The wound has been in treatment 6 weeks. The wound is located on the Right,Lateral Lower Leg. The wound measures 0cm length x 0cm width x 0cm depth; 0cm^2 area and 0cm^3 volume. There is a small amount of serous drainage noted. Wound #13 status is Open. Original cause of wound was Gradually Appeared. The date acquired was: 10/21/2020. The wound has been in treatment 6 weeks. The wound is located on the Left Gluteus. The wound measures 0.4cm length x 1.5cm width x 0.1cm depth; 0.471cm^2 area and 0.047cm^3 volume. There is Fat Layer (Subcutaneous Tissue) exposed. There is no tunneling or undermining noted. There is a medium amount of serosanguineous drainage noted. The wound  margin is flat and intact. There is large (67-100%) red granulation within the wound  bed. There is no necrotic tissue within the wound bed. Assessment Active Problems ICD-10 Chronic venous hypertension (idiopathic) with ulcer and inflammation of bilateral lower extremity Lymphedema, not elsewhere classified Pressure ulcer of left buttock, stage 2 Non-pressure chronic ulcer of other part of left lower leg limited to breakdown of skin Non-pressure chronic ulcer of other part of right lower leg limited to breakdown of skin Plan Follow-up Appointments: Return Appointment in 2 weeks. - with Dr. Dellia Nims Bathing/ Shower/ Hygiene: May shower and wash wound with soap and water. Edema Control - Lymphedema / SCD / Other: Elevate legs to the level of the heart or above for 30 minutes daily and/or when sitting, a frequency of: - throughout the day Avoid standing for long periods of time. Exercise regularly Moisturize legs daily. Compression stocking or Garment 20-30 mm/Hg pressure to: - apply juxtalite compression garments daily to lower legs Off-Loading: Turn and reposition every 2 hours Other: - stand at least every hour during the day WOUND #13: - Gluteus Wound Laterality: Left Cleanser: Wound Cleanser 1 x Per Day/15 Days Discharge Instructions: Cleanse the wound with wound cleanser prior to applying a clean dressing using gauze sponges, not tissue or cotton balls. Peri-Wound Care: Zinc Oxide Ointment 30g tube 1 x Per Day/15 Days Discharge Instructions: Apply Zinc Oxide to irritated buttocks to keep urine off the skin Prim Dressing: KerraCel Ag Gelling Fiber Dressing, 2x2 in (silver alginate) 1 x Per Day/15 Days ary Discharge Instructions: Apply silver alginate to wound bed as instructed Secondary Dressing: Bordered Gauze, 4x4 in 1 x Per Day/15 Days Discharge Instructions: Apply over primary dressing as directed. 1. I continued with silver alginate. Zinc oxide paste around the wound on both sides 2. It is going to be difficult to heal this area and also to prevent future  wounds unless we can eliminate the risk factors here which includes pressure friction and apparently frequent urinary incontinence and wetness. 3. Fortunately no evidence of infection Electronic Signature(s) Signed: 08/06/2021 4:39:12 PM By: Linton Ham MD Entered By: Linton Ham on 08/06/2021 13:43:43 -------------------------------------------------------------------------------- SuperBill Details Patient Name: Date of Service: Katrina Amel D. 08/06/2021 Medical Record Number: [id number] Patient Account Number: [id number] Date of Birth/Sex: Treating RN: 1931-11-19 (87 y.o. Katrina [name] Primary Care Provider: [name] Other Clinician: Referring Provider: Treating Provider/Extender: [name] in Treatment: 6 Diagnosis Coding ICD-10 Codes Code Description [phone] Chronic venous hypertension (idiopathic) with ulcer and inflammation of bilateral lower extremity I89.0 Lymphedema, not elsewhere classified L89.322 Pressure ulcer of left buttock, stage 2 L97.821 Non-pressure chronic ulcer of other part of left lower leg limited to breakdown of skin L97.811 Non-pressure chronic ulcer of other part of right lower leg limited to breakdown of skin Facility Procedures CPT4 Code: [id number] Description: 99213 - WOUND CARE VISIT-LEV 3 EST PT Modifier: Quantity: 1 Physician Procedures : CPT4 Code Description Modifier [id number] - WC PHYS LEVEL 3 - EST PT ICD-10 Diagnosis Description L89.322 Pressure ulcer of left buttock, stage 2 I87.333 Chronic venous hypertension (idiopathic) with ulcer and inflammation of bilateral lower  extremity Quantity: 1 Electronic Signature(s) Signed: 08/06/2021 4:39:12 PM By: Linton Ham MD Entered By: Linton Ham on 08/06/2021 13:44:03

## 2021-08-20 ENCOUNTER — Other Ambulatory Visit: Payer: Self-pay

## 2021-08-20 ENCOUNTER — Encounter (HOSPITAL_BASED_OUTPATIENT_CLINIC_OR_DEPARTMENT_OTHER): Payer: Medicare Other | Admitting: Internal Medicine

## 2021-08-20 DIAGNOSIS — I87333 Chronic venous hypertension (idiopathic) with ulcer and inflammation of bilateral lower extremity: Secondary | ICD-10-CM | POA: Diagnosis not present

## 2021-08-20 NOTE — Progress Notes (Signed)
Katrina [name], Katrina [name] ([id number]) Visit Report for 08/20/2021 Arrival Information Details Patient Name: Date of Service: Katrina [name], Katrina D. 08/20/2021 1:00 PM Medical Record Number: [id number] Patient Account Number: [id number] Date of Birth/Sex: Treating RN: 03/14/1932 (87 y.o. Katrina [name], Katrina [name] Primary Care Jabarri Stefanelli: [name] Other Clinician: Referring Zephyra Bernardi: Treating Niajah Sipos/Extender: [name] in Treatment: 8 Visit Information History Since Last Visit Added or deleted any medications: No Patient Arrived: Gilford Rile Any new allergies or adverse reactions: No Arrival Time: 12:56 Had a fall or experienced change in No Accompanied By: self activities of daily living that may affect Transfer Assistance: Manual risk of falls: Patient Identification Verified: Yes Signs or symptoms of abuse/neglect since last visito No Secondary Verification Process Completed: Yes Hospitalized since last visit: No Patient Requires Transmission-Based Precautions: No Implantable device outside of the clinic excluding No Patient Has Alerts: Yes cellular tissue based products placed in the center Patient Alerts: Patient on Blood Thinner since last visit: Has Dressing in Place as Prescribed: Yes Pain Present Now: No Electronic Signature(s) Signed: 08/20/2021 5:07:42 PM By: Rhae Hammock RN Entered By: Rhae Hammock on 08/20/2021 12:57:06 -------------------------------------------------------------------------------- Clinic Level of Care Assessment Details Patient Name: Date of Service: [place], NONAKA D. 08/20/2021 1:00 PM Medical Record Number: [id number] Patient Account Number: [id number] Date of Birth/Sex: Treating RN: April 17, 1932 (87 y.o. Katrina [name], Katrina [name] Primary Care Chaley Castellanos: [name] Other Clinician: Referring Nicosha Struve: Treating Cheetara Hoge/Extender: [name] in Treatment: 8 Clinic Level of Care Assessment Items TOOL 4  Quantity Score X- 1 0 Use when only an EandM is performed on FOLLOW-UP visit ASSESSMENTS - Nursing Assessment / Reassessment X- 1 10 Reassessment of Co-morbidities (includes updates in patient status) X- 1 5 Reassessment of Adherence to Treatment Plan ASSESSMENTS - Wound and Skin A ssessment / Reassessment X - Simple Wound Assessment / Reassessment - one wound 1 5 '[]'  - 0 Complex Wound Assessment / Reassessment - multiple wounds '[]'  - 0 Dermatologic / Skin Assessment (not related to wound area) ASSESSMENTS - Focused Assessment '[]'  - 0 Circumferential Edema Measurements - multi extremities '[]'  - 0 Nutritional Assessment / Counseling / Intervention '[]'  - 0 Lower Extremity Assessment (monofilament, tuning fork, pulses) '[]'  - 0 Peripheral Arterial Disease Assessment (using hand held doppler) ASSESSMENTS - Ostomy and/or Continence Assessment and Care '[]'  - 0 Incontinence Assessment and Management '[]'  - 0 Ostomy Care Assessment and Management (repouching, etc.) PROCESS - Coordination of Care X - Simple Patient / Family Education for ongoing care 1 15 '[]'  - 0 Complex (extensive) Patient / Family Education for ongoing care X- 1 10 Staff obtains Programmer, systems, Records, T Results / Process Orders est X- 1 10 Staff telephones HHA, Nursing Homes / Clarify orders / etc '[]'  - 0 Routine Transfer to another Facility (non-emergent condition) '[]'  - 0 Routine Hospital Admission (non-emergent condition) '[]'  - 0 New Admissions / Biomedical engineer / Ordering NPWT Apligraf, etc. , '[]'  - 0 Emergency Hospital Admission (emergent condition) X- 1 10 Simple Discharge Coordination '[]'  - 0 Complex (extensive) Discharge Coordination PROCESS - Special Needs '[]'  - 0 Pediatric / Minor Patient Management '[]'  - 0 Isolation Patient Management '[]'  - 0 Hearing / Language / Visual special needs '[]'  - 0 Assessment of Community assistance (transportation, D/C planning, etc.) '[]'  - 0 Additional assistance / Altered  mentation '[]'  - 0 Support Surface(s) Assessment (bed, cushion, seat, etc.) INTERVENTIONS - Wound Cleansing / Measurement X - Simple Wound Cleansing - one wound 1 5 '[]'  - 0 Complex  Wound Cleansing - multiple wounds X- 1 5 Wound Imaging (photographs - any number of wounds) '[]'  - 0 Wound Tracing (instead of photographs) X- 1 5 Simple Wound Measurement - one wound '[]'  - 0 Complex Wound Measurement - multiple wounds INTERVENTIONS - Wound Dressings X - Small Wound Dressing one or multiple wounds 1 10 '[]'  - 0 Medium Wound Dressing one or multiple wounds '[]'  - 0 Large Wound Dressing one or multiple wounds '[]'  - 0 Application of Medications - topical '[]'  - 0 Application of Medications - injection INTERVENTIONS - Miscellaneous '[]'  - 0 External ear exam '[]'  - 0 Specimen Collection (cultures, biopsies, blood, body fluids, etc.) '[]'  - 0 Specimen(s) / Culture(s) sent or taken to Lab for analysis '[]'  - 0 Patient Transfer (multiple staff / Civil Service fast streamer / Similar devices) '[]'  - 0 Simple Staple / Suture removal (25 or less) '[]'  - 0 Complex Staple / Suture removal (26 or more) '[]'  - 0 Hypo / Hyperglycemic Management (close monitor of Blood Glucose) '[]'  - 0 Ankle / Brachial Index (ABI) - do not check if billed separately X- 1 5 Vital Signs Has the patient been seen at the hospital within the last three years: Yes Total Score: 95 Level Of Care: New/Established - Level 3 Electronic Signature(s) Signed: 08/20/2021 5:07:42 PM By: Rhae Hammock RN Entered By: Rhae Hammock on 08/20/2021 13:36:12 -------------------------------------------------------------------------------- Encounter Discharge Information Details Patient Name: Date of Service: Katrina Amel D. 08/20/2021 1:00 PM Medical Record Number: [id number] Patient Account Number: [id number] Date of Birth/Sex: Treating RN: 12/10/1931 (87 y.o. Katrina [name], Katrina [name] Primary Care Alon Mazor: [name] Other Clinician: Referring  Emrey Thornley: Treating Alailah Safley/Extender: [name] in Treatment: 8 Encounter Discharge Information Items Discharge Condition: Stable Ambulatory Status: Walker Discharge Destination: Home Transportation: Private Auto Accompanied By: self Schedule Follow-up Appointment: Yes Clinical Summary of Care: Patient Declined Electronic Signature(s) Signed: 08/20/2021 5:07:42 PM By: Rhae Hammock RN Entered By: Rhae Hammock on 08/20/2021 13:52:39 -------------------------------------------------------------------------------- Lower Extremity Assessment Details Patient Name: Date of Service: Katrina Amel D. 08/20/2021 1:00 PM Medical Record Number: [id number] Patient Account Number: [id number] Date of Birth/Sex: Treating RN: [birth year] (87 y.o. Katrina [name], Katrina [name] Primary Care Vyron Fronczak: [name] Other Clinician: Referring Deshawnda Acrey: Treating Envi Eagleson/Extender: [name] in Treatment: 8 Electronic Signature(s) Signed: 08/20/2021 5:07:42 PM By: Rhae Hammock RN Entered By: Rhae Hammock on 08/20/2021 12:57:31 -------------------------------------------------------------------------------- Multi Wound Chart Details Patient Name: Date of Service: Katrina Amel D. 08/20/2021 1:00 PM Medical Record Number: [id number] Patient Account Number: [id number] Date of Birth/Sex: Treating RN: Jun 03, 1932 (87 y.o. Katrina [name] Primary Care Kaula Klenke: [name] Other Clinician: Referring Esgar Barnick: Treating Oddie Bottger/Extender: [name] in Treatment: 8 Vital Signs Height(in): 74 Pulse(bpm): 45 Weight(lbs): 086 Blood Pressure(mmHg): 146/73 Body Mass Index(BMI): 42 Temperature(F): 98 Respiratory Rate(breaths/min): 17 Photos: [N/A:N/A] Left Gluteus N/A N/A Wound Location: Gradually Appeared N/A N/A Wounding Event: Pressure Ulcer N/A N/A Primary Etiology: Sleep Apnea, Arrhythmia, N/A  N/A Comorbid History: Hypertension, Phlebitis 10/21/2020 N/A N/A Date Acquired: 8 N/A N/A Weeks of Treatment: Open N/A N/A Wound Status: Yes N/A N/A Clustered Wound: 2 N/A N/A Clustered Quantity: 0.4x1.5x0.5 N/A N/A Measurements L x W x D (cm) 0.471 N/A N/A A (cm) : rea 0.236 N/A N/A Volume (cm) : -19.80% N/A N/A % Reduction in A rea: -505.10% N/A N/A % Reduction in Volume: Category/Stage II N/A N/A Classification: Medium N/A N/A Exudate A mount: Serosanguineous N/A N/A Exudate Type: red, brown N/A N/A Exudate Color: Flat  and Intact N/A N/A Wound Margin: Large (67-100%) N/A N/A Granulation A mount: Red N/A N/A Granulation Quality: None Present (0%) N/A N/A Necrotic A mount: Fat Layer (Subcutaneous Tissue): Yes N/A N/A Exposed Structures: Fascia: No Tendon: No Muscle: No Joint: No Bone: No Large (67-100%) N/A N/A Epithelialization: Treatment Notes Electronic Signature(s) Signed: 08/20/2021 5:16:25 PM By: Linton Ham MD Signed: 08/20/2021 5:52:00 PM By: Deon Pilling RN, BSN Entered By: Linton Ham on 08/20/2021 13:35:45 -------------------------------------------------------------------------------- Multi-Disciplinary Care Plan Details Patient Name: Date of Service: Katrina Amel D. 08/20/2021 1:00 PM Medical Record Number: [id number] Patient Account Number: [id number] Date of Birth/Sex: Treating RN: 1931/12/08 (87 y.o. Katrina [name], Katrina [name] Primary Care Thaniel Coluccio: [name] Other Clinician: Referring Denna Fryberger: Treating Maddeline Roorda/Extender: [name] in Treatment: 8 Multidisciplinary Care Plan reviewed with physician Active Inactive Abuse / Safety / Falls / Self Care Management Nursing Diagnoses: Potential for falls Potential for injury related to falls Goals: Patient will remain injury free related to falls Date Initiated: 06/25/2021 Date Inactivated: 08/06/2021 Target Resolution Date: 07/24/2021 Goal  Status: Met Patient/caregiver will verbalize/demonstrate measures taken to prevent injury and/or falls Date Initiated: 06/25/2021 Target Resolution Date: 08/21/2021 Goal Status: Active Interventions: Assess Activities of Daily Living upon admission and as needed Assess fall risk on admission and as needed Assess: immobility, friction, shearing, incontinence upon admission and as needed Assess impairment of mobility on admission and as needed per policy Assess personal safety and home safety (as indicated) on admission and as needed Assess self care needs on admission and as needed Provide education on fall prevention Provide education on personal and home safety Notes: Wound/Skin Impairment Nursing Diagnoses: Impaired tissue integrity Knowledge deficit related to ulceration/compromised skin integrity Goals: Patient/caregiver will verbalize understanding of skin care regimen Date Initiated: 06/25/2021 Target Resolution Date: 08/21/2021 Goal Status: Active Interventions: Assess patient/caregiver ability to obtain necessary supplies Assess patient/caregiver ability to perform ulcer/skin care regimen upon admission and as needed Assess ulceration(s) every visit Provide education on ulcer and skin care Notes: Electronic Signature(s) Signed: 08/20/2021 5:07:42 PM By: Rhae Hammock RN Entered By: Rhae Hammock on 08/20/2021 13:30:39 -------------------------------------------------------------------------------- Pain Assessment Details Patient Name: Date of Service: Katrina Amel D. 08/20/2021 1:00 PM Medical Record Number: [id number] Patient Account Number: [id number] Date of Birth/Sex: Treating RN: 08-08-1932 (87 y.o. Katrina [name], Katrina [name] Primary Care Dulce Martian: [name] Other Clinician: Referring Okema Rollinson: Treating Aybree Lanyon/Extender: [name] in Treatment: 8 Active Problems Location of Pain Severity and Description of Pain Patient  Has Paino No Site Locations Pain Management and Medication Current Pain Management: Electronic Signature(s) Signed: 08/20/2021 5:07:42 PM By: Rhae Hammock RN Entered By: Rhae Hammock on 08/20/2021 12:57:24 -------------------------------------------------------------------------------- Patient/Caregiver Education Details Patient Name: Date of Service: Katrina [name] 12/29/2022andnbsp1:00 PM Medical Record Number: [id number] Patient Account Number: [id number] Date of Birth/Gender: Treating RN: [birth year] (87 y.o. Katrina [name], Katrina [name] Primary Care Physician: [name] Other Clinician: Referring Physician: Treating Physician/Extender: [name] in Treatment: 8 Education Assessment Education Provided To: Patient Education Topics Provided Safety: Methods: Explain/Verbal Responses: Reinforcements needed, State content correctly Electronic Signature(s) Signed: 08/20/2021 5:07:42 PM By: Rhae Hammock RN Entered By: Rhae Hammock on 08/20/2021 13:31:32 -------------------------------------------------------------------------------- Wound Assessment Details Patient Name: Date of Service: Katrina Amel D. 08/20/2021 1:00 PM Medical Record Number: [id number] Patient Account Number: [id number] Date of Birth/Sex: Treating RN: 03-27-1932 (87 y.o. Katrina [name], Katrina [name] Primary Care Austina Constantin: [name] Other Clinician: Referring Elaine Roanhorse: Treating Kurstin Dimarzo/Extender: [name] in Treatment: 8 Wound  Status Wound Number: 13 Primary Etiology: Pressure Ulcer Wound Location: Left Gluteus Wound Status: Open Wounding Event: Gradually Appeared Comorbid History: Sleep Apnea, Arrhythmia, Hypertension, Phlebitis Date Acquired: 10/21/2020 Weeks Of Treatment: 8 Clustered Wound: Yes Photos Wound Measurements Length: (cm) 0.4 Width: (cm) 1.5 Depth: (cm) 0.5 Clustered Quantity: 2 Area: (cm) 0.471 Volume:  (cm) 0.236 % Reduction in Area: -19.8% % Reduction in Volume: -505.1% Epithelialization: Large (67-100%) Tunneling: No Undermining: No Wound Description Classification: Category/Stage II Wound Margin: Flat and Intact Exudate Amount: Medium Exudate Type: Serosanguineous Exudate Color: red, brown Foul Odor After Cleansing: No Slough/Fibrino No Wound Bed Granulation Amount: Large (67-100%) Exposed Structure Granulation Quality: Red Fascia Exposed: No Necrotic Amount: None Present (0%) Fat Layer (Subcutaneous Tissue) Exposed: Yes Tendon Exposed: No Muscle Exposed: No Joint Exposed: No Bone Exposed: No Treatment Notes Wound #13 (Gluteus) Wound Laterality: Left Cleanser Wound Cleanser Discharge Instruction: Cleanse the wound with wound cleanser prior to applying a clean dressing using gauze sponges, not tissue or cotton balls. Peri-Wound Care Zinc Oxide Ointment 30g tube Discharge Instruction: Apply Zinc Oxide to irritated buttocks to keep urine off the skin Topical Primary Dressing Maxorb Extra Calcium Alginate Dressing, 4x4 in Discharge Instruction: Apply calcium alginate to wound bed as instructed. Please apply to all excoriated areas-you may use a whole piece of alginate to do that. Secondary Dressing ABD Pad, 5x9 Discharge Instruction: Apply over primary dressing as directed. Secured With 58M Medipore H Soft Cloth Surgical T ape, 4 x 10 (in/yd) Discharge Instruction: Secure with tape as directed. Compression Wrap Compression Stockings Add-Ons Electronic Signature(s) Signed: 08/20/2021 5:07:42 PM By: Rhae Hammock RN Entered By: Rhae Hammock on 08/20/2021 13:14:07 -------------------------------------------------------------------------------- Vitals Details Patient Name: Date of Service: Katrina Amel D. 08/20/2021 1:00 PM Medical Record Number: [id number] Patient Account Number: [id number] Date of Birth/Sex: Treating RN: 02/16/1932 (87 y.o. Katrina [name],  Katrina [name] Primary Care Kristell Wooding: [name] Other Clinician: Referring Brennley Curtice: Treating Kripa Foskey/Extender: [name] in Treatment: 8 Vital Signs Time Taken: 12:57 Temperature (F): 98 Height (in): 61 Pulse (bpm): 66 Weight (lbs): 221 Respiratory Rate (breaths/min): 17 Body Mass Index (BMI): 41.8 Blood Pressure (mmHg): 146/73 Reference Range: 80 - 120 mg / dl Electronic Signature(s) Signed: 08/20/2021 5:07:42 PM By: Rhae Hammock RN Entered By: Rhae Hammock on 08/20/2021 13:03:14

## 2021-08-20 NOTE — Progress Notes (Signed)
Katrina [name] ([id number]) Visit Report for 08/20/2021 HPI Details Patient Name: Date of Service: [name], [name] 08/20/2021 1:00 PM Medical Record Number: [id number] Patient Account Number: [id number] Date of Birth/Sex: Treating RN: Dec 21, 1931 (87 y.o. Katrina [name] Primary Care Provider: [name] Other Clinician: Referring Provider: Treating Provider/Extender: [name] in Treatment: 8 History of Present Illness Location: right lower extremity Quality: Patient reports experiencing a dull pain to affected area(s). Severity: Patient states wound(s) are getting better. Duration: Patient has had the wound for 3 months prior to presenting for treatment Context: The wound would happen gradually Modifying Factors: Patient wound(s)/ulcer(s) are worsening due to :standing for a long while. HPI Description: Past medical history of pulmonary emboli, hypothyroidism, hyperlipidemia, hypertension, sleep apnea, gallstones, ulcerated lower extremities, atrial fibrillation, status post total knee replacement, cholecystectomy, cystoscopy and stent placement for kidney stones. She has never been a smoker. About a year ago she was seen by [name] with an ulcer of her right lower extremity which she's had for about over 3 months. On 10/23/14 -- Lower extremity venous duplex evaluation. She had no evidence of deep venous thromboses involving both lower extremities. There was evidence of deep vein reflux in the right and left common femoral veins. She also had evidence of great saphenous vein reflux noted only at the level of the saphenofemoral junction. No evidence of greater or small saphenous vein reflux. She will need a consult to the vascular surgeon. Seen by Dr. Mathis Fare office note dated 11/13/2014. He noted that she had good arterial blood flow with palpable DP pulses bilaterally. Based on the patient's history Dr. Scot Dock recommended elevation when at rest of both feet  and knees and ankles above heart level in the supine position. Activity such as walking and pool exercises were recommended. She will also have 15-20 mm thigh-high compression stockings daily once the ulcers have completely healed. A prescription was given to her. 11/04/2015 -- the wound on her right lower extremity has completely healed and her edema has gone down significantly. However in her popliteal fossa where she's got a lot of fungal infection there is an area which is now an open wound and this will need attention. 12/02/2015 -- the right lower extremity looks much better than the edema has gone down significantly however she continues to have significant edema on the left side today. 12/09/2015 -- the patient has been doing her dressings as advised and says that overall she feels much better. She has received her juxta light for the left lower extremity READMISSION 06/25/2021 This is a patient who is actually had several previous visits to our clinic in 2014, 2016 and more recently 2017. This was largely because of wounds on her lower legs in the setting of chronic venous insufficiency and secondary lymphedema. Looking at my records she was apparently prescribed thigh-high compression stockings probably by vein and vascular although she comes back in not having worn any stockings in quite a period of time. She is now in the assisted living part of [facility]. She thinks her wounds have been there since April. This includes the left lateral, right anterior and right posterior lateral parts of both lower legs. She also has a stage II pressure ulcer on her left glued I think which is happened because of sleeping in a lift chair. She had ABDs and kerlix on her bilateral lower legs. Past medical history reviewed she has hypothyroidism, left buttock pressure ulcer dating back to March  of this year, iron deficiency, falls, paroxysmal atrial fibrillation  anticoagulated and chronic venous insufficiency ABIs in our clinic were 0.86 on the right and 0.96 on the left 11/10; patient comes into clinic today after being admitted last week with uncontrolled lymphedema, severe venous insufficiency and some superficial wounds on her bilateral lower legs. We put her in 3 layer compression and she comes in today with all the wounds closed and considerable improvement in the condition of her lower legs. The patient is going to need lifelong compression stockings probably juxta lites and we are going to go ahead and order those for her today. Unfortunately the POA here is a niece in [place] were probably going to have to talk with her to arrange proper payment for the juxta lite stockings. She is in the assisted living level of [facility]. She does not feel she can get the stockings on independently I am hopeful that the nurse and aids at the assisted living can help her with this otherwise this may be a problem. She also has an area on her left buttock which is a stage II wound. A lot of unhealthy irritated skin around this I think from moisture prolonged sittingo Incontinence 11/17; the patient's area on the left buttock is just about closed although there is still a lot of unhealthy looking discolored skin around a large area here. I suspect this is chronic moisture, pressure, perhaps incontinence. she has minuscule open areas on her bilateral lower legs. Apparently our intermediary did not get the order for juxta lite stockings therefore she does not have any. 12/1; both leg wounds are healed and she has been wearing bilateral juxta lite stockings for 2 days. [facility] are [facility] her with this. The left buttock wound unfortunately has deteriorated 12/15; 2-week follow-up. Her legs remain healed she has the other area which is on her left buttock. This is surrounded circumferentially by chronically irritated skin. Mirror image  on the right side as well. I suspect this is chronic pressure friction and moisture from incontinence. This makes it difficult to get this area to completely close. We have been using silver alginate 12/29 both her legs remain closed and she has bilateral juxta lite stockings. The real problem here is her left buttock she has a small wound in the mid part of a extremely irritated dermatitis. It almost looks bruised in spots. There is superficial loss of epithelium. The actual wound that we have been dealing with does not look so bad. I have been assuming this is some combination of friction pressure wetness from constant urinary incontinence. If there is a dermatologic issue outside of that here I am not seeing it Electronic Signature(s) Signed: 08/20/2021 5:16:25 PM By: Linton Ham MD Entered By: Linton Ham on 08/20/2021 13:39:34 -------------------------------------------------------------------------------- Physical Exam Details Patient Name: Date of Service: Katrina Amel D. 08/20/2021 1:00 PM Medical Record Number: [id number] Patient Account Number: [id number] Date of Birth/Sex: Treating RN: Sep 12, 1931 (87 y.o. Katrina [name] Primary Care Provider: [name] Other Clinician: Referring Provider: Treating Provider/Extender: [name] in Treatment: 8 Constitutional Sitting or standing Blood Pressure is within target range for patient.. Pulse regular and within target range for patient.Marland Kitchen Respirations regular, non-labored and within target range.. Temperature is normal and within the target range for the patient.Marland Kitchen Appears in no distress. Notes Wound exam; the patient had a protruding area in the left buttock. This actually looks better. It is more adherent what concerns  me is the surrounding skin. There is a large area that is de-epithelialized weeping and irritated looking. This is not specifically infected. It does not look like a fungal  infection or bacterial Electronic Signature(s) Signed: 08/20/2021 5:16:25 PM By: Linton Ham MD Entered By: Linton Ham on 08/20/2021 13:40:34 -------------------------------------------------------------------------------- Physician Orders Details Patient Name: Date of Service: Katrina Amel D. 08/20/2021 1:00 PM Medical Record Number: [id number] Patient Account Number: [id number] Date of Birth/Sex: Treating RN: [birth year] (87 y.o. [name], Lauren Primary Care Provider: [name] Other Clinician: Referring Provider: Treating Provider/Extender: [name] in Treatment: 8 Verbal / Phone Orders: No Diagnosis Coding Follow-up Appointments ppointment in 2 weeks. - with Dr. Dellia Nims Return A Bathing/ Shower/ Hygiene May shower and wash wound with soap and water. Edema Control - Lymphedema / SCD / Other Elevate legs to the level of the heart or above for 30 minutes daily and/or when sitting, a frequency of: - throughout the day Avoid standing for long periods of time. Exercise regularly Moisturize legs daily. Compression stocking or Garment 20-30 mm/Hg pressure to: - apply juxtalite compression garments daily to lower legs Off-Loading Turn and reposition every 2 hours Other: - stand at least every hour during the day Wound Treatment Wound #13 - Gluteus Wound Laterality: Left Cleanser: Wound Cleanser 1 x Per Day/15 Days Discharge Instructions: Cleanse the wound with wound cleanser prior to applying a clean dressing using gauze sponges, not tissue or cotton balls. Peri-Wound Care: Zinc Oxide Ointment 30g tube 1 x Per Day/15 Days Discharge Instructions: Apply Zinc Oxide to irritated buttocks to keep urine off the skin Prim Dressing: Maxorb Extra Calcium Alginate Dressing, 4x4 in 1 x Per Day/15 Days ary Discharge Instructions: Apply calcium alginate to wound bed as instructed. Please apply to all excoriated areas-you may use a whole piece of  alginate to do that. Secondary Dressing: ABD Pad, 5x9 1 x Per Day/15 Days Discharge Instructions: Apply over primary dressing as directed. Secured With: 48M Medipore H Soft Cloth Surgical T ape, 4 x 10 (in/yd) 1 x Per Day/15 Days Discharge Instructions: Secure with tape as directed. Electronic Signature(s) Signed: 08/20/2021 5:07:42 PM By: Rhae Hammock RN Signed: 08/20/2021 5:16:25 PM By: Linton Ham MD Entered By: Rhae Hammock on 08/20/2021 13:35:01 -------------------------------------------------------------------------------- Problem List Details Patient Name: Date of Service: Katrina Amel D. 08/20/2021 1:00 PM Medical Record Number: [id number] Patient Account Number: [id number] Date of Birth/Sex: Treating RN: 03-06-1932 (87 y.o. [name], [name] Primary Care Provider: [name] Other Clinician: Referring Provider: Treating Provider/Extender: [name] in Treatment: 8 Active Problems ICD-10 Encounter Code Description Active Date MDM Diagnosis I87.333 Chronic venous hypertension (idiopathic) with ulcer and inflammation of 06/25/2021 No Yes bilateral lower extremity I89.0 Lymphedema, not elsewhere classified 06/25/2021 No Yes L89.322 Pressure ulcer of left buttock, stage 2 06/25/2021 No Yes Inactive Problems ICD-10 Code Description Active Date Inactive Date L97.821 Non-pressure chronic ulcer of other part of left lower leg limited to breakdown of skin 06/25/2021 06/25/2021 L97.811 Non-pressure chronic ulcer of other part of right lower leg limited to breakdown of skin 06/25/2021 06/25/2021 Resolved Problems Electronic Signature(s) Signed: 08/20/2021 5:16:25 PM By: Linton Ham MD Entered By: Linton Ham on 08/20/2021 13:35:36 -------------------------------------------------------------------------------- Progress Note Details Patient Name: Date of Service: Katrina Amel D. 08/20/2021 1:00 PM Medical Record Number:  [id number] Patient Account Number: [id number] Date of Birth/Sex: Treating RN: 03-12-1932 (87 y.o. Katrina [name] Primary Care Provider: [name] Other Clinician: Referring Provider: Treating Provider/Extender: Dellia Nims  Jonnie Kind, Lincoln Maxin in Treatment: 8 Subjective History of Present Illness (HPI) The following HPI elements were documented for the patient's wound: Location: right lower extremity Quality: Patient reports experiencing a dull pain to affected area(s). Severity: Patient states wound(s) are getting better. Duration: Patient has had the wound for 3 months prior to presenting for treatment Context: The wound would happen gradually Modifying Factors: Patient wound(s)/ulcer(s) are worsening due to :standing for a long while. Past medical history of pulmonary emboli, hypothyroidism, hyperlipidemia, hypertension, sleep apnea, gallstones, ulcerated lower extremities, atrial fibrillation, status post total knee replacement, cholecystectomy, cystoscopy and stent placement for kidney stones. She has never been a smoker. About a year ago she was seen by [name] with an ulcer of her right lower extremity which she's had for about over 3 months. On 10/23/14 -- Lower extremity venous duplex evaluation. She had no evidence of deep venous thromboses involving both lower extremities. There was evidence of deep vein reflux in the right and left common femoral veins. She also had evidence of great saphenous vein reflux noted only at the level of the saphenofemoral junction. No evidence of greater or small saphenous vein reflux. She will need a consult to the vascular surgeon. Seen by Dr. Mathis Fare office note dated 11/13/2014. He noted that she had good arterial blood flow with palpable DP pulses bilaterally. Based on the patient's history Dr. Scot Dock recommended elevation when at rest of both feet and knees and ankles above heart level in the supine position. Activity such as  walking and pool exercises were recommended. She will also have 15-20 mm thigh-high compression stockings daily once the ulcers have completely healed. A prescription was given to her. 11/04/2015 -- the wound on her right lower extremity has completely healed and her edema has gone down significantly. However in her popliteal fossa where she's got a lot of fungal infection there is an area which is now an open wound and this will need attention. 12/02/2015 -- the right lower extremity looks much better than the edema has gone down significantly however she continues to have significant edema on the left side today. 12/09/2015 -- the patient has been doing her dressings as advised and says that overall she feels much better. She has received her juxta light for the left lower extremity READMISSION 06/25/2021 This is a patient who is actually had several previous visits to our clinic in 2014, 2016 and more recently 2017. This was largely because of wounds on her lower legs in the setting of chronic venous insufficiency and secondary lymphedema. Looking at my records she was apparently prescribed thigh-high compression stockings probably by vein and vascular although she comes back in not having worn any stockings in quite a period of time. She is now in the assisted living part of [facility]. She thinks her wounds have been there since April. This includes the left lateral, right anterior and right posterior lateral parts of both lower legs. She also has a stage II pressure ulcer on her left glued I think which is happened because of sleeping in a lift chair. She had ABDs and kerlix on her bilateral lower legs. Past medical history reviewed she has hypothyroidism, left buttock pressure ulcer dating back to March of this year, iron deficiency, falls, paroxysmal atrial fibrillation anticoagulated and chronic venous insufficiency ABIs in our clinic were 0.86 on the right  and 0.96 on the left 11/10; patient comes into clinic today after being admitted last week with uncontrolled lymphedema,  severe venous insufficiency and some superficial wounds on her bilateral lower legs. We put her in 3 layer compression and she comes in today with all the wounds closed and considerable improvement in the condition of her lower legs. The patient is going to need lifelong compression stockings probably juxta lites and we are going to go ahead and order those for her today. Unfortunately the POA here is a niece in [place] were probably going to have to talk with her to arrange proper payment for the juxta lite stockings. She is in the assisted living level of [facility]. She does not feel she can get the stockings on independently I am hopeful that the nurse and aids at the assisted living can help her with this otherwise this may be a problem. She also has an area on her left buttock which is a stage II wound. A lot of unhealthy irritated skin around this I think from moisture prolonged sittingo Incontinence 11/17; the patient's area on the left buttock is just about closed although there is still a lot of unhealthy looking discolored skin around a large area here. I suspect this is chronic moisture, pressure, perhaps incontinence. she has minuscule open areas on her bilateral lower legs. Apparently our intermediary did not get the order for juxta lite stockings therefore she does not have any. 12/1; both leg wounds are healed and she has been wearing bilateral juxta lite stockings for 2 days. [facility] are [facility] her with this. The left buttock wound unfortunately has deteriorated 12/15; 2-week follow-up. Her legs remain healed she has the other area which is on her left buttock. This is surrounded circumferentially by chronically irritated skin. Mirror image on the right side as well. I suspect this is chronic pressure friction and moisture from  incontinence. This makes it difficult to get this area to completely close. We have been using silver alginate 12/29 both her legs remain closed and she has bilateral juxta lite stockings. The real problem here is her left buttock she has a small wound in the mid part of a extremely irritated dermatitis. It almost looks bruised in spots. There is superficial loss of epithelium. The actual wound that we have been dealing with does not look so bad. I have been assuming this is some combination of friction pressure wetness from constant urinary incontinence. If there is a dermatologic issue outside of that here I am not seeing it Objective Constitutional Sitting or standing Blood Pressure is within target range for patient.. Pulse regular and within target range for patient.Marland Kitchen Respirations regular, non-labored and within target range.. Temperature is normal and within the target range for the patient.Marland Kitchen Appears in no distress. Vitals Time Taken: 12:57 PM, Height: 61 in, Weight: 221 lbs, BMI: 41.8, Temperature: 98 F, Pulse: 66 bpm, Respiratory Rate: 17 breaths/min, Blood Pressure: 146/73 mmHg. General Notes: Wound exam; the patient had a protruding area in the left buttock. This actually looks better. It is more adherent what concerns me is the surrounding skin. There is a large area that is de-epithelialized weeping and irritated looking. This is not specifically infected. It does not look like a fungal infection or bacterial Integumentary (Hair, Skin) Wound #13 status is Open. Original cause of wound was Gradually Appeared. The date acquired was: 10/21/2020. The wound has been in treatment 8 weeks. The wound is located on the Left Gluteus. The wound measures 0.4cm length x 1.5cm width x 0.5cm depth; 0.471cm^2 area and 0.236cm^3 volume. There  is Fat Layer (Subcutaneous Tissue) exposed. There is no tunneling or undermining noted. There is a medium amount of serosanguineous drainage noted. The  wound margin is flat and intact. There is large (67-100%) red granulation within the wound bed. There is no necrotic tissue within the wound bed. Assessment Active Problems ICD-10 Chronic venous hypertension (idiopathic) with ulcer and inflammation of bilateral lower extremity Lymphedema, not elsewhere classified Pressure ulcer of left buttock, stage 2 Plan Follow-up Appointments: Return Appointment in 2 weeks. - with Dr. Dellia Nims Bathing/ Shower/ Hygiene: May shower and wash wound with soap and water. Edema Control - Lymphedema / SCD / Other: Elevate legs to the level of the heart or above for 30 minutes daily and/or when sitting, a frequency of: - throughout the day Avoid standing for long periods of time. Exercise regularly Moisturize legs daily. Compression stocking or Garment 20-30 mm/Hg pressure to: - apply juxtalite compression garments daily to lower legs Off-Loading: Turn and reposition every 2 hours Other: - stand at least every hour during the day WOUND #13: - Gluteus Wound Laterality: Left Cleanser: Wound Cleanser 1 x Per Day/15 Days Discharge Instructions: Cleanse the wound with wound cleanser prior to applying a clean dressing using gauze sponges, not tissue or cotton balls. Peri-Wound Care: Zinc Oxide Ointment 30g tube 1 x Per Day/15 Days Discharge Instructions: Apply Zinc Oxide to irritated buttocks to keep urine off the skin Prim Dressing: Maxorb Extra Calcium Alginate Dressing, 4x4 in 1 x Per Day/15 Days ary Discharge Instructions: Apply calcium alginate to wound bed as instructed. Please apply to all excoriated areas-you may use a whole piece of alginate to do that. Secondary Dressing: ABD Pad, 5x9 1 x Per Day/15 Days Discharge Instructions: Apply over primary dressing as directed. Secured With: 41M Medipore H Soft Cloth Surgical T ape, 4 x 10 (in/yd) 1 x Per Day/15 Days Discharge Instructions: Secure with tape as directed. 1. I have changed the primary dressing to  calcium alginate, occasional contact dermatitis to silver 2. I had some thoughts about a pure steroid or possibly Lotrisone 3. Also some thoughts about a Foley cathetero Chronic dermatitis. From incontinence, moisture, friction and pressure 4. Her wound is just about closed it is skin around this that worries me Electronic Signature(s) Signed: 08/20/2021 5:16:25 PM By: Linton Ham MD Entered By: Linton Ham on 08/20/2021 13:42:23 -------------------------------------------------------------------------------- SuperBill Details Patient Name: Date of Service: Katrina Amel D. 08/20/2021 Medical Record Number: [id number] Patient Account Number: [id number] Date of Birth/Sex: Treating RN: 1932/04/04 (87 y.o. Katrina [name] Primary Care Provider: [name] Other Clinician: Referring Provider: Treating Provider/Extender: [name] in Treatment: 8 Diagnosis Coding ICD-10 Codes Code Description [phone] Chronic venous hypertension (idiopathic) with ulcer and inflammation of bilateral lower extremity I89.0 Lymphedema, not elsewhere classified L89.322 Pressure ulcer of left buttock, stage 2 Facility Procedures CPT4 Code: [id number] Description: 99213 - WOUND CARE VISIT-LEV 3 EST PT Modifier: Quantity: 1 Physician Procedures : CPT4 Code Description Modifier [id number] 99213 - WC PHYS LEVEL 3 - EST PT ICD-10 Diagnosis Description L89.322 Pressure ulcer of left buttock, stage 2 Quantity: 1 Electronic Signature(s) Signed: 08/20/2021 5:07:42 PM By: Rhae Hammock RN Signed: 08/20/2021 5:16:25 PM By: Linton Ham MD Entered By: Rhae Hammock on 08/20/2021 13:52:01

## 2021-09-03 ENCOUNTER — Other Ambulatory Visit: Payer: Self-pay

## 2021-09-03 ENCOUNTER — Encounter (HOSPITAL_BASED_OUTPATIENT_CLINIC_OR_DEPARTMENT_OTHER): Payer: Medicare Other | Attending: Internal Medicine | Admitting: Internal Medicine

## 2021-09-03 DIAGNOSIS — L89322 Pressure ulcer of left buttock, stage 2: Secondary | ICD-10-CM | POA: Diagnosis not present

## 2021-09-03 DIAGNOSIS — I890 Lymphedema, not elsewhere classified: Secondary | ICD-10-CM | POA: Diagnosis not present

## 2021-09-03 DIAGNOSIS — I87333 Chronic venous hypertension (idiopathic) with ulcer and inflammation of bilateral lower extremity: Secondary | ICD-10-CM | POA: Diagnosis not present

## 2021-09-03 NOTE — Progress Notes (Addendum)
TYERA, HANSLEY ([id number]) Visit Report for 09/03/2021 HPI Details Patient Name: Date of Service: Katrina [name], Katrina D. 09/03/2021 1:00 PM Medical Record Number: [id number] Patient Account Number: [id number] Date of Birth/Sex: Treating RN: Katrina [name] (88 y.o. Katrina [name] Primary Care Provider: [name] Other Clinician: Referring Provider: Treating Provider/Extender: [name] in Treatment: 10 History of Present Illness Location: right lower extremity Quality: Patient reports experiencing a dull pain to affected area(s). Severity: Patient states wound(s) are getting better. Duration: Patient has had the wound for 3 months prior to presenting for treatment Context: The wound would happen gradually Modifying Factors: Patient wound(s)/ulcer(s) are worsening due to :standing for a long while. HPI Description: Past medical history of pulmonary emboli, hypothyroidism, hyperlipidemia, hypertension, sleep apnea, gallstones, ulcerated lower extremities, atrial fibrillation, status post total knee replacement, cholecystectomy, cystoscopy and stent placement for kidney stones. She has never been a smoker. About a year ago she was seen by [name] with an ulcer of her right lower extremity which she's had for about over 3 months. On 10/23/14 -- Lower extremity venous duplex evaluation. She had no evidence of deep venous thromboses involving both lower extremities. There was evidence of deep vein reflux in the right and left common femoral veins. She also had evidence of great saphenous vein reflux noted only at the level of the saphenofemoral junction. No evidence of greater or small saphenous vein reflux. She will need a consult to the vascular surgeon. Seen by Dr. Mathis Fare office note dated 11/13/2014. He noted that she had good arterial blood flow with palpable DP pulses bilaterally. Based on the patient's history Dr. Scot Dock recommended elevation when at rest of both feet and  knees and ankles above heart level in the supine position. Activity such as walking and pool exercises were recommended. She will also have 15-20 mm thigh-high compression stockings daily once the ulcers have completely healed. A prescription was given to her. 11/04/2015 -- the wound on her right lower extremity has completely healed and her edema has gone down significantly. However in her popliteal fossa where she's got a lot of fungal infection there is an area which is now an open wound and this will need attention. 12/02/2015 -- the right lower extremity looks much better than the edema has gone down significantly however she continues to have significant edema on the left side today. 12/09/2015 -- the patient has been doing her dressings as advised and says that overall she feels much better. She has received her juxta light for the left lower extremity READMISSION 06/25/2021 This is a patient who is actually had several previous visits to our clinic in 2014, 2016 and more recently 2017. This was largely because of wounds on her lower legs in the setting of chronic venous insufficiency and secondary lymphedema. Looking at my records she was apparently prescribed thigh-high compression stockings probably by vein and vascular although she comes back in not having worn any stockings in quite a period of time. She is now in the assisted living [facility] of [facility]. She thinks her wounds have been there since April. This includes the left lateral, right anterior and right posterior lateral parts of both lower legs. She also has a stage II pressure ulcer on her left glued I think which is happened because of sleeping in a lift chair. She had ABDs and kerlix on her bilateral lower legs. Past medical history reviewed she has hypothyroidism, left buttock pressure ulcer dating back to March  of this year, iron deficiency, falls, paroxysmal atrial fibrillation anticoagulated  and chronic venous insufficiency ABIs in our clinic were 0.86 on the right and 0.96 on the left 11/10; patient comes into clinic today after being admitted last week with uncontrolled lymphedema, severe venous insufficiency and some superficial wounds on her bilateral lower legs. We put her in 3 layer compression and she comes in today with all the wounds closed and considerable improvement in the condition of her lower legs. The patient is going to need lifelong compression stockings probably juxta lites and we are going to go ahead and order those for her today. Unfortunately the POA here is a niece in [place] were probably going to have to talk with her to arrange proper payment for the juxta lite stockings. She is in the assisted living level of [facility]. She does not feel she can get the stockings on independently I am hopeful that the nurse and aids at the assisted living can help her with this otherwise this may be a problem. She also has an area on her left buttock which is a stage II wound. A lot of unhealthy irritated skin around this I think from moisture prolonged sittingo Incontinence 11/17; the patient's area on the left buttock is just about closed although there is still a lot of unhealthy looking discolored skin around a large area here. I suspect this is chronic moisture, pressure, perhaps incontinence. she has minuscule open areas on her bilateral lower legs. Apparently our intermediary did not get the order for juxta lite stockings therefore she does not have any. 12/1; both leg wounds are healed and she has been wearing bilateral juxta lite stockings for 2 days. [facility] her with this. The left buttock wound unfortunately has deteriorated 12/15; 2-week follow-up. Her legs remain healed she has the other area which is on her left buttock. This is surrounded circumferentially by chronically irritated skin. Mirror image on the right  side as well. I suspect this is chronic pressure friction and moisture from incontinence. This makes it difficult to get this area to completely close. We have been using silver alginate 12/29 both her legs remain closed and she has bilateral juxta lite stockings. The real problem here is her left buttock she has a small wound in the mid part of a extremely irritated dermatitis. It almost looks bruised in spots. There is superficial loss of epithelium. The actual wound that we have been dealing with does not look so bad. I have been assuming this is some combination of friction pressure wetness from constant urinary incontinence. If there is a dermatologic issue outside of that here I am not seeing it 09/03/2021; patient with a small area on the left buttock. The real problem here is a marked surrounding erythematous dermatitis with loss of surface epithelium. It looks as though she is beginning to develop satellite lesions this may be mostly fungal Electronic Signature(s) Signed: 09/03/2021 4:29:39 PM By: Linton Ham MD Entered By: Linton Ham on 09/03/2021 13:55:11 -------------------------------------------------------------------------------- Physical Exam Details Patient Name: Date of Service: Katrina Amel D. 09/03/2021 1:00 PM Medical Record Number: [id number] Patient Account Number: [id number] Date of Birth/Sex: Treating RN: [birth year] (88 y.o. Katrina [name] Primary Care Provider: [name] Other Clinician: Referring Provider: Treating Provider/Extender: [name] in Treatment: 10 Constitutional Patient is hypertensive.. Pulse regular and within target range for patient.Marland Kitchen Respirations regular, non-labored and within target range.. Temperature is normal and within  the target range for the patient.Marland Kitchen Appears in no distress. Notes Wound exam; small protruding area in the left buttock. If this is actually looking like it has a healthy surface.  Equally concerning to the wound there is a large area of bilateral surrounding irritated dermatitis. This is de-epithelializing weeping. She is starting to develop what looks like satellite lesions I wonder whether this is a Automotive engineer) Signed: 09/03/2021 4:29:39 PM By: Linton Ham MD Entered By: Linton Ham on 09/03/2021 13:56:04 -------------------------------------------------------------------------------- Physician Orders Details Patient Name: Date of Service: Katrina Amel D. 09/03/2021 1:00 PM Medical Record Number: [id number] Patient Account Number: [id number] Date of Birth/Sex: Treating RN: [birth year] (88 y.o. [name] Primary Care Provider: [name] Other Clinician: Referring Provider: Treating Provider/Extender: [name] in Treatment: 10 Verbal / Phone Orders: No Diagnosis Coding ICD-10 Coding Code Description [phone] Chronic venous hypertension (idiopathic) with ulcer and inflammation of bilateral lower extremity I89.0 Lymphedema, not elsewhere classified L89.322 Pressure ulcer of left buttock, stage 2 Follow-up Appointments ppointment in 2 weeks. - with Dr. Dellia Nims Return A Bathing/ Shower/ Hygiene May shower and wash wound with soap and water. Edema Control - Lymphedema / SCD / Other Elevate legs to the level of the heart or above for 30 minutes daily and/or when sitting, a frequency of: - throughout the day Avoid standing for long periods of time. Exercise regularly Moisturize legs daily. Compression stocking or Garment 20-30 mm/Hg pressure to: - apply juxtalite compression garments daily to lower legs Off-Loading Turn and reposition every 2 hours Other: - stand at least every hour during the day Wound Treatment Wound #13 - Gluteus Wound Laterality: Left Cleanser: Wound Cleanser 1 x Per Day/30 Days Discharge Instructions: Cleanse the wound with wound cleanser prior to applying a  clean dressing using gauze sponges, not tissue or cotton balls. Peri-Wound Care: Ketoconazole Cream 2% 1 x Per Day/30 Days Discharge Instructions: Apply to all red areas/periwound Peri-Wound Care: Zinc Oxide Ointment 30g tube 1 x Per Day/30 Days Discharge Instructions: Apply Zinc Oxide over Ketoconazole Prim Dressing: Maxorb Extra Calcium Alginate Dressing, 4x4 in 1 x Per Day/30 Days ary Discharge Instructions: Apply calcium alginate to wound bed as instructed. Please apply to all excoriated areas-you may use a whole piece of alginate to do that. Secondary Dressing: ABD Pad, 5x9 1 x Per Day/30 Days Discharge Instructions: Apply over primary dressing as directed. Secured With: 53M Medipore H Soft Cloth Surgical T ape, 4 x 10 (in/yd) 1 x Per Day/30 Days Discharge Instructions: Secure with tape as directed. Electronic Signature(s) Signed: 09/03/2021 4:29:39 PM By: Linton Ham MD Signed: 09/03/2021 5:44:04 PM By: Lorrin Jackson Entered By: Lorrin Jackson on 09/03/2021 13:51:12 -------------------------------------------------------------------------------- Problem List Details Patient Name: Date of Service: Katrina Amel D. 09/03/2021 1:00 PM Medical Record Number: [id number] Patient Account Number: [id number] Date of Birth/Sex: Treating RN: [birth year] (88 y.o. [name] Primary Care Provider: [name] Other Clinician: Referring Provider: Treating Provider/Extender: [name] in Treatment: 10 Active Problems ICD-10 Encounter Code Description Active Date MDM Diagnosis I87.333 Chronic venous hypertension (idiopathic) with ulcer and inflammation of 06/25/2021 No Yes bilateral lower extremity I89.0 Lymphedema, not elsewhere classified 06/25/2021 No Yes L89.322 Pressure ulcer of left buttock, stage 2 06/25/2021 No Yes Inactive Problems ICD-10 Code Description Active Date Inactive Date L97.821 Non-pressure chronic ulcer of other part of left  lower leg limited to breakdown of skin 06/25/2021 06/25/2021 L97.811 Non-pressure chronic ulcer of other part of right  lower leg limited to breakdown of skin 06/25/2021 06/25/2021 Resolved Problems Electronic Signature(s) Signed: 09/03/2021 4:29:39 PM By: Linton Ham MD Entered By: Linton Ham on 09/03/2021 13:54:19 -------------------------------------------------------------------------------- Progress Note Details Patient Name: Date of Service: Katrina Amel D. 09/03/2021 1:00 PM Medical Record Number: [id number] Patient Account Number: [id number] Date of Birth/Sex: Treating RN: [name] (88 y.o. Katrina [name] Primary Care Provider: [name] Other Clinician: Referring Provider: Treating Provider/Extender: [name] in Treatment: 10 Subjective History of Present Illness (HPI) The following HPI elements were documented for the patient's wound: Location: right lower extremity Quality: Patient reports experiencing a dull pain to affected area(s). Severity: Patient states wound(s) are getting better. Duration: Patient has had the wound for 3 months prior to presenting for treatment Context: The wound would happen gradually Modifying Factors: Patient wound(s)/ulcer(s) are worsening due to :standing for a long while. Past medical history of pulmonary emboli, hypothyroidism, hyperlipidemia, hypertension, sleep apnea, gallstones, ulcerated lower extremities, atrial fibrillation, status post total knee replacement, cholecystectomy, cystoscopy and stent placement for kidney stones. She has never been a smoker. About a year ago she was seen by [name] with an ulcer of her right lower extremity which she's had for about over 3 months. On 10/23/14 -- Lower extremity venous duplex evaluation. She had no evidence of deep venous thromboses involving both lower extremities. There was evidence of deep vein reflux in the right and left common femoral veins. She also  had evidence of great saphenous vein reflux noted only at the level of the saphenofemoral junction. No evidence of greater or small saphenous vein reflux. She will need a consult to the vascular surgeon. Seen by Dr. Mathis Fare office note dated 11/13/2014. He noted that she had good arterial blood flow with palpable DP pulses bilaterally. Based on the patient's history Dr. Scot Dock recommended elevation when at rest of both feet and knees and ankles above heart level in the supine position. Activity such as walking and pool exercises were recommended. She will also have 15-20 mm thigh-high compression stockings daily once the ulcers have completely healed. A prescription was given to her. 11/04/2015 -- the wound on her right lower extremity has completely healed and her edema has gone down significantly. However in her popliteal fossa where she's got a lot of fungal infection there is an area which is now an open wound and this will need attention. 12/02/2015 -- the right lower extremity looks much better than the edema has gone down significantly however she continues to have significant edema on the left side today. 12/09/2015 -- the patient has been doing her dressings as advised and says that overall she feels much better. She has received her juxta light for the left lower extremity READMISSION 06/25/2021 This is a patient who is actually had several previous visits to our clinic in 2014, 2016 and more recently 2017. This was largely because of wounds on her lower legs in the setting of chronic venous insufficiency and secondary lymphedema. Looking at my records she was apparently prescribed thigh-high compression stockings probably by vein and vascular although she comes back in not having worn any stockings in quite a period of time. She is now in the [facility]. She thinks her wounds have been there since April. This includes the left  lateral, right anterior and right posterior lateral parts of both lower legs. She also has a stage II pressure ulcer on her left glued I think which is  happened because of sleeping in a lift chair. She had ABDs and kerlix on her bilateral lower legs. Past medical history reviewed she has hypothyroidism, left buttock pressure ulcer dating back to March of this year, iron deficiency, falls, paroxysmal atrial fibrillation anticoagulated and chronic venous insufficiency ABIs in our clinic were 0.86 on the right and 0.96 on the left 11/10; patient comes into clinic today after being admitted last week with uncontrolled lymphedema, severe venous insufficiency and some superficial wounds on her bilateral lower legs. We put her in 3 layer compression and she comes in today with all the wounds closed and considerable improvement in the condition of her lower legs. The patient is going to need lifelong compression stockings probably juxta lites and we are going to go ahead and order those for her today. Unfortunately the POA here is a niece in [place] were probably going to have to talk with her to arrange proper payment for the juxta lite stockings. She is in the assisted living level of [facility]. She does not feel she can get the stockings on independently I am hopeful that the nurse and aids at the assisted living can help her with this otherwise this may be a problem. She also has an area on her left buttock which is a stage II wound. A lot of unhealthy irritated skin around this I think from moisture prolonged sittingo Incontinence 11/17; the patient's area on the left buttock is just about closed although there is still a lot of unhealthy looking discolored skin around a large area here. I suspect this is chronic moisture, pressure, perhaps incontinence. she has minuscule open areas on her bilateral lower legs. Apparently our intermediary did not get the order for juxta lite stockings  therefore she does not have any. 12/1; both leg wounds are healed and she has been wearing bilateral juxta lite stockings for 2 days. [facility] with this. The left buttock wound unfortunately has deteriorated 12/15; 2-week follow-up. Her legs remain healed she has the other area which is on her left buttock. This is surrounded circumferentially by chronically irritated skin. Mirror image on the right side as well. I suspect this is chronic pressure friction and moisture from incontinence. This makes it difficult to get this area to completely close. We have been using silver alginate 12/29 both her legs remain closed and she has bilateral juxta lite stockings. The real problem here is her left buttock she has a small wound in the mid part of a extremely irritated dermatitis. It almost looks bruised in spots. There is superficial loss of epithelium. The actual wound that we have been dealing with does not look so bad. I have been assuming this is some combination of friction pressure wetness from constant urinary incontinence. If there is a dermatologic issue outside of that here I am not seeing it 09/03/2021; patient with a small area on the left buttock. The real problem here is a marked surrounding erythematous dermatitis with loss of surface epithelium. It looks as though she is beginning to develop satellite lesions this may be mostly fungal Objective Constitutional Patient is hypertensive.. Pulse regular and within target range for patient.Marland Kitchen Respirations regular, non-labored and within target range.. Temperature is normal and within the target range for the patient.Marland Kitchen Appears in no distress. Vitals Time Taken: 1:33 PM, Height: 61 in, Weight: 221 lbs, BMI: 41.8, Temperature: 98.0 F, Pulse: 66 bpm, Respiratory Rate: 17 breaths/min, Blood Pressure: 161/66 mmHg. General  Notes: Wound exam; small protruding area in the left buttock. If this is actually looking like it  has a healthy surface. Equally concerning to the wound there is a large area of bilateral surrounding irritated dermatitis. This is de-epithelializing weeping. She is starting to develop what looks like satellite lesions I wonder whether this is a candidal/fungal issue Integumentary (Hair, Skin) Wound #13 status is Open. Original cause of wound was Gradually Appeared. The date acquired was: 10/21/2020. The wound has been in treatment 10 weeks. The wound is located on the Left Gluteus. The wound measures 0.5cm length x 1.5cm width x 0.3cm depth; 0.589cm^2 area and 0.177cm^3 volume. There is Fat Layer (Subcutaneous Tissue) exposed. There is no tunneling or undermining noted. There is a medium amount of serosanguineous drainage noted. The wound margin is distinct with the outline attached to the wound base. There is large (67-100%) red granulation within the wound bed. There is no necrotic tissue within the wound bed. Assessment Active Problems ICD-10 Chronic venous hypertension (idiopathic) with ulcer and inflammation of bilateral lower extremity Lymphedema, not elsewhere classified Pressure ulcer of left buttock, stage 2 Plan Follow-up Appointments: Return Appointment in 2 weeks. - with Dr. Dellia Nims Bathing/ Shower/ Hygiene: May shower and wash wound with soap and water. Edema Control - Lymphedema / SCD / Other: Elevate legs to the level of the heart or above for 30 minutes daily and/or when sitting, a frequency of: - throughout the day Avoid standing for long periods of time. Exercise regularly Moisturize legs daily. Compression stocking or Garment 20-30 mm/Hg pressure to: - apply juxtalite compression garments daily to lower legs Off-Loading: Turn and reposition every 2 hours Other: - stand at least every hour during the day WOUND #13: - Gluteus Wound Laterality: Left Cleanser: Wound Cleanser 1 x Per Day/30 Days Discharge Instructions: Cleanse the wound with wound cleanser prior to  applying a clean dressing using gauze sponges, not tissue or cotton balls. Peri-Wound Care: Ketoconazole Cream 2% 1 x Per Day/30 Days Discharge Instructions: Apply to all red areas/periwound Peri-Wound Care: Zinc Oxide Ointment 30g tube 1 x Per Day/30 Days Discharge Instructions: Apply Zinc Oxide over Ketoconazole Prim Dressing: Maxorb Extra Calcium Alginate Dressing, 4x4 in 1 x Per Day/30 Days ary Discharge Instructions: Apply calcium alginate to wound bed as instructed. Please apply to all excoriated areas-you may use a whole piece of alginate to do that. Secondary Dressing: ABD Pad, 5x9 1 x Per Day/30 Days Discharge Instructions: Apply over primary dressing as directed. Secured With: 67M Medipore H Soft Cloth Surgical T ape, 4 x 10 (in/yd) 1 x Per Day/30 Days Discharge Instructions: Secure with tape as directed. 1. We continue with silver alginate on the wound itself 2. Ketoconazole 2% topical liberally to both buttock areas 3. Covering zinc oxide Electronic Signature(s) Signed: 09/03/2021 4:29:39 PM By: Linton Ham MD Entered By: Linton Ham on 09/03/2021 13:56:48 -------------------------------------------------------------------------------- SuperBill Details Patient Name: Date of Service: Katrina Amel D. 09/03/2021 Medical Record Number: [id number] Patient Account Number: [id number] Date of Birth/Sex: Treating RN: [name] (88 y.o. Katrina [name] Primary Care Provider: [name] Other Clinician: Referring Provider: Treating Provider/Extender: [name] in Treatment: 10 Diagnosis Coding ICD-10 Codes Code Description [phone] Chronic venous hypertension (idiopathic) with ulcer and inflammation of bilateral lower extremity I89.0 Lymphedema, not elsewhere classified L89.322 Pressure ulcer of left buttock, stage 2 Facility Procedures CPT4 Code: [id number] Description: [phone] - WOUND CARE VISIT-LEV 2 EST PT Modifier: Quantity:  1 Physician Procedures : CPT4 Code  Description Modifier [id number] - WC PHYS LEVEL 3 - EST PT ICD-10 Diagnosis Description L89.322 Pressure ulcer of left buttock, stage 2 Quantity: 1 Electronic Signature(s) Signed: 09/08/2021 4:57:19 PM By: Lorrin Jackson Signed: 09/09/2021 3:43:46 PM By: Linton Ham MD Previous Signature: 09/03/2021 4:29:39 PM Version By: Linton Ham MD Entered By: Lorrin Jackson on 09/08/2021 16:57:19

## 2021-09-04 NOTE — Progress Notes (Addendum)
Katrina, [name] ([id number]) Visit Report for 09/03/2021 Arrival Information Details Patient Name: Date of Service: Katrina, [name] 09/03/2021 1:00 PM Medical Record Number: [id number] Patient Account Number: [id number] Date of Birth/Sex: Treating RN: 1931/10/02 (88 y.o. [name], [name] Primary Care Simone Tuckey: [name] Other Clinician: Referring Johann Gascoigne: Treating Henrine Hayter/Extender: [name] in Treatment: 10 Visit Information History Since Last Visit Added or deleted any medications: No Patient Arrived: Katrina [name] Any new allergies or adverse reactions: No Arrival Time: 13:30 Had a fall or experienced change in No Accompanied By: self activities of daily living that may affect Transfer Assistance: None risk of falls: Patient Identification Verified: Yes Signs or symptoms of abuse/neglect since last visito No Secondary Verification Process Completed: Yes Hospitalized since last visit: No Patient Requires Transmission-Based Precautions: No Implantable device outside of the clinic excluding No Patient Has Alerts: Yes cellular tissue based products placed in the center Patient Alerts: Patient on Blood Thinner since last visit: Has Dressing in Place as Prescribed: Yes Pain Present Now: Yes Electronic Signature(s) Signed: 09/04/2021 9:17:29 AM By: Sandre Kitty Entered By: Sandre Kitty on 09/03/2021 13:33:32 -------------------------------------------------------------------------------- Clinic Level of Care Assessment Details Patient Name: Date of Service: [name], Katrina D. 09/03/2021 1:00 PM Medical Record Number: [id number] Patient Account Number: [id number] Date of Birth/Sex: Treating RN: 13-Nov-1931 (88 y.o. Katrina [name] Primary Care Brainard Highfill: [name] Other Clinician: Referring Aliyha Fornes: Treating Tajuana Kniskern/Extender: [name] in Treatment: 10 Clinic Level of Care Assessment Items TOOL 4 Quantity  Score X- 1 0 Use when only an EandM is performed on FOLLOW-UP visit ASSESSMENTS - Nursing Assessment / Reassessment X- 1 10 Reassessment of Co-morbidities (includes updates in patient status) X- 1 5 Reassessment of Adherence to Treatment Plan ASSESSMENTS - Wound and Skin A ssessment / Reassessment X - Simple Wound Assessment / Reassessment - one wound 1 5 '[]'  - 0 Complex Wound Assessment / Reassessment - multiple wounds '[]'  - 0 Dermatologic / Skin Assessment (not related to wound area) ASSESSMENTS - Focused Assessment '[]'  - 0 Circumferential Edema Measurements - multi extremities '[]'  - 0 Nutritional Assessment / Counseling / Intervention '[]'  - 0 Lower Extremity Assessment (monofilament, tuning fork, pulses) '[]'  - 0 Peripheral Arterial Disease Assessment (using hand held doppler) ASSESSMENTS - Ostomy and/or Continence Assessment and Care '[]'  - 0 Incontinence Assessment and Management '[]'  - 0 Ostomy Care Assessment and Management (repouching, etc.) PROCESS - Coordination of Care '[]'  - 0 Simple Patient / Family Education for ongoing care X- 1 20 Complex (extensive) Patient / Family Education for ongoing care '[]'  - 0 Staff obtains Programmer, systems, Records, T Results / Process Orders est '[]'  - 0 Staff telephones HHA, Nursing Homes / Clarify orders / etc '[]'  - 0 Routine Transfer to another Facility (non-emergent condition) '[]'  - 0 Routine Hospital Admission (non-emergent condition) '[]'  - 0 New Admissions / Biomedical engineer / Ordering NPWT Apligraf, etc. , '[]'  - 0 Emergency Hospital Admission (emergent condition) '[]'  - 0 Simple Discharge Coordination '[]'  - 0 Complex (extensive) Discharge Coordination PROCESS - Special Needs '[]'  - 0 Pediatric / Minor Patient Management '[]'  - 0 Isolation Patient Management '[]'  - 0 Hearing / Language / Visual special needs '[]'  - 0 Assessment of Community assistance (transportation, D/C planning, etc.) '[]'  - 0 Additional assistance / Altered  mentation '[]'  - 0 Support Surface(s) Assessment (bed, cushion, seat, etc.) INTERVENTIONS - Wound Cleansing / Measurement X - Simple Wound Cleansing - one wound 1 5 '[]'  - 0 Complex Wound Cleansing -  multiple wounds X- 1 5 Wound Imaging (photographs - any number of wounds) '[]'  - 0 Wound Tracing (instead of photographs) X- 1 5 Simple Wound Measurement - one wound '[]'  - 0 Complex Wound Measurement - multiple wounds INTERVENTIONS - Wound Dressings '[]'  - 0 Small Wound Dressing one or multiple wounds X- 1 15 Medium Wound Dressing one or multiple wounds '[]'  - 0 Large Wound Dressing one or multiple wounds '[]'  - 0 Application of Medications - topical '[]'  - 0 Application of Medications - injection INTERVENTIONS - Miscellaneous '[]'  - 0 External ear exam '[]'  - 0 Specimen Collection (cultures, biopsies, blood, body fluids, etc.) '[]'  - 0 Specimen(s) / Culture(s) sent or taken to Lab for analysis '[]'  - 0 Patient Transfer (multiple staff / Civil Service fast streamer / Similar devices) '[]'  - 0 Simple Staple / Suture removal (25 or less) '[]'  - 0 Complex Staple / Suture removal (26 or more) '[]'  - 0 Hypo / Hyperglycemic Management (close monitor of Blood Glucose) '[]'  - 0 Ankle / Brachial Index (ABI) - do not check if billed separately X- 1 5 Vital Signs Has the patient been seen at the hospital within the last three years: Yes Total Score: 75 Level Of Care: New/Established - Level 2 Electronic Signature(s) Signed: 09/08/2021 5:01:22 PM By: Lorrin Jackson Entered By: Lorrin Jackson on 09/08/2021 16:57:09 -------------------------------------------------------------------------------- Encounter Discharge Information Details Patient Name: Date of Service: Katrina Amel D. 09/03/2021 1:00 PM Medical Record Number: [id number] Patient Account Number: [id number] Date of Birth/Sex: Treating RN: [birth year] (88 y.o. Katrina [name] Primary Care Beatriz Settles: [name] Other Clinician: Referring Hanaan Gancarz: Treating  Sunshyne Horvath/Extender: [name] in Treatment: 10 Encounter Discharge Information Items Discharge Condition: Stable Ambulatory Status: Cane Discharge Destination: [place] Orders Sent: Yes Transportation: Other Schedule Follow-up Appointment: Yes Clinical Summary of Care: Provided on 09/03/2021 Form Type Recipient Paper Patient Patient Electronic Signature(s) Signed: 09/03/2021 2:36:17 PM By: Lorrin Jackson Entered By: Lorrin Jackson on 09/03/2021 14:36:17 -------------------------------------------------------------------------------- Lower Extremity Assessment Details Patient Name: Date of Service: Katrina Amel D. 09/03/2021 1:00 PM Medical Record Number: [id number] Patient Account Number: [id number] Date of Birth/Sex: Treating RN: Jan 31, 1932 (88 y.o. Katrina [name] Primary Care Deanndra Kirley: [name] Other Clinician: Referring Naseem Adler: Treating Traci Gafford/Extender: [name] in Treatment: 10 Electronic Signature(s) Signed: 09/03/2021 5:44:04 PM By: Lorrin Jackson Entered By: Lorrin Jackson on 09/03/2021 13:44:14 -------------------------------------------------------------------------------- Multi Wound Chart Details Patient Name: Date of Service: Katrina Amel D. 09/03/2021 1:00 PM Medical Record Number: [id number] Patient Account Number: [id number] Date of Birth/Sex: Treating RN: [birth year] (88 y.o. [name] Primary Care Kyle Luppino: [name] Other Clinician: Referring Janah Mcculloh: Treating Kyrra Prada/Extender: [name] in Treatment: 10 Vital Signs Height(in): 61 Pulse(bpm): 9 Weight(lbs): 221 Blood Pressure(mmHg): 161/66 Body Mass Index(BMI): 42 Temperature(F): 98.0 Respiratory Rate(breaths/min): 17 Photos: [N/A:N/A] Left Gluteus N/A N/A Wound Location: Gradually Appeared N/A N/A Wounding Event: Pressure Ulcer N/A N/A Primary Etiology: Sleep  Apnea, Arrhythmia, N/A N/A Comorbid History: Hypertension, Phlebitis 10/21/2020 N/A N/A Date Acquired: 10 N/A N/A Weeks of Treatment: Open N/A N/A Wound Status: Yes N/A N/A Clustered Wound: 2 N/A N/A Clustered Quantity: 0.5x1.5x0.3 N/A N/A Measurements L x W x D (cm) 0.589 N/A N/A A (cm) : rea 0.177 N/A N/A Volume (cm) : -49.90% N/A N/A % Reduction in A rea: -353.80% N/A N/A % Reduction in Volume: Category/Stage II N/A N/A Classification: Medium N/A N/A Exudate A mount: Serosanguineous N/A N/A Exudate Type: red, brown N/A N/A Exudate Color:  Distinct, outline attached N/A N/A Wound Margin: Large (67-100%) N/A N/A Granulation A mount: Red N/A N/A Granulation Quality: None Present (0%) N/A N/A Necrotic A mount: Fat Layer (Subcutaneous Tissue): Yes N/A N/A Exposed Structures: Fascia: No Tendon: No Muscle: No Joint: No Bone: No Large (67-100%) N/A N/A Epithelialization: Treatment Notes Electronic Signature(s) Signed: 09/03/2021 4:29:39 PM By: Linton Ham MD Signed: 09/04/2021 1:40:06 PM By: Deon Pilling RN, BSN Entered By: Linton Ham on 09/03/2021 13:54:29 -------------------------------------------------------------------------------- Multi-Disciplinary Care Plan Details Patient Name: Date of Service: Katrina Amel D. 09/03/2021 1:00 PM Medical Record Number: [id number] Patient Account Number: [id number] Date of Birth/Sex: Treating RN: [birth year] (88 y.o. Katrina [name] Primary Care Shatoya Roets: [name] Other Clinician: Referring Maddax Palinkas: Treating Binyomin Brann/Extender: [name] in Treatment: [place] reviewed with physician Active Inactive Abuse / Safety / [place] / Self Care Management Nursing Diagnoses: Potential for falls Potential for injury related to falls Goals: Patient will remain injury free related to falls Date Initiated: 06/25/2021 Date Inactivated: 08/06/2021 Target  Resolution Date: 07/24/2021 Goal Status: Met Patient/caregiver will verbalize/demonstrate measures taken to prevent injury and/or falls Date Initiated: 06/25/2021 Target Resolution Date: 10/01/2021 Goal Status: Active Interventions: Assess Activities of Daily Living upon admission and as needed Assess fall risk on admission and as needed Assess: immobility, friction, shearing, incontinence upon admission and as needed Assess impairment of mobility on admission and as needed per policy Assess personal safety and home safety (as indicated) on admission and as needed Assess self care needs on admission and as needed Provide education on fall prevention Provide education on personal and home safety Notes: Wound/Skin Impairment Nursing Diagnoses: Impaired tissue integrity Knowledge deficit related to ulceration/compromised skin integrity Goals: Patient/caregiver will verbalize understanding of skin care regimen Date Initiated: 06/25/2021 Target Resolution Date: 10/01/2021 Goal Status: Active Interventions: Assess patient/caregiver ability to obtain necessary supplies Assess patient/caregiver ability to perform ulcer/skin care regimen upon admission and as needed Assess ulceration(s) every visit Provide education on ulcer and skin care Notes: Electronic Signature(s) Signed: 09/03/2021 5:44:04 PM By: Lorrin Jackson Entered By: Lorrin Jackson on 09/03/2021 13:44:51 -------------------------------------------------------------------------------- Pain Assessment Details Patient Name: Date of Service: Katrina Amel D. 09/03/2021 1:00 PM Medical Record Number: [id number] Patient Account Number: [id number] Date of Birth/Sex: Treating RN: [birth year] (88 y.o. [name] Primary Care Emalie Mcwethy: [name] Other Clinician: Referring Cloyde Oregel: Treating Gabryel Files/Extender: [name] in Treatment: 10 Active Problems Location of Pain Severity and Description of  Pain Patient Has Paino Yes Site Locations Rate the pain. Current Pain Level: 3 Pain Management and Medication Current Pain Management: Electronic Signature(s) Signed: 09/04/2021 9:17:29 AM By: Sandre Kitty Signed: 09/04/2021 1:40:06 PM By: Deon Pilling RN, BSN Entered By: Sandre Kitty on 09/03/2021 13:34:02 -------------------------------------------------------------------------------- Patient/Caregiver Education Details Patient Name: Date of Service: [place] 1/12/2023andnbsp1:00 PM Medical Record Number: [id number] Patient Account Number: [id number] Date of Birth/Gender: Treating RN: Nov 22, 1931 (88 y.o. Katrina [name] Primary Care Physician: [name] Other Clinician: Referring Physician: Treating Physician/Extender: [name] in Treatment: 10 Education Assessment Education Provided To: Patient Education Topics Provided Safety: Methods: Explain/Verbal, Printed Responses: State content correctly Wound/Skin Impairment: Methods: Explain/Verbal, Printed Responses: State content correctly Electronic Signature(s) Signed: 09/03/2021 5:44:04 PM By: Lorrin Jackson Entered By: Lorrin Jackson on 09/03/2021 13:45:12 -------------------------------------------------------------------------------- Wound Assessment Details Patient Name: Date of Service: Katrina Amel D. 09/03/2021 1:00 PM Medical Record Number: [id number] Patient Account Number: [id number] Date of Birth/Sex: Treating RN: May 11, 1932 (88 y.o. F)  Deon Pilling Primary Care Vlad Mayberry: [name] Other Clinician: Referring Suttyn Cryder: Treating Aniqua Briere/Extender: [name] in Treatment: 10 Wound Status Wound Number: 13 Primary Etiology: Pressure Ulcer Wound Location: Left Gluteus Wound Status: Open Wounding Event: Gradually Appeared Comorbid History: Sleep Apnea, Arrhythmia, Hypertension, Phlebitis Date Acquired: 10/21/2020 Weeks Of  Treatment: 10 Clustered Wound: Yes Photos Wound Measurements Length: (cm) 0.5 Width: (cm) 1.5 Depth: (cm) 0.3 Clustered Quantity: 2 Area: (cm) 0.589 Volume: (cm) 0.177 % Reduction in Area: -49.9% % Reduction in Volume: -353.8% Epithelialization: Large (67-100%) Tunneling: No Undermining: No Wound Description Classification: Category/Stage II Wound Margin: Distinct, outline attached Exudate Amount: Medium Exudate Type: Serosanguineous Exudate Color: red, brown Foul Odor After Cleansing: No Slough/Fibrino No Wound Bed Granulation Amount: Large (67-100%) Exposed Structure Granulation Quality: Red Fascia Exposed: No Necrotic Amount: None Present (0%) Fat Layer (Subcutaneous Tissue) Exposed: Yes Tendon Exposed: No Muscle Exposed: No Joint Exposed: No Bone Exposed: No Treatment Notes Wound #13 (Gluteus) Wound Laterality: Left Cleanser Wound Cleanser Discharge Instruction: Cleanse the wound with wound cleanser prior to applying a clean dressing using gauze sponges, not tissue or cotton balls. Peri-Wound Care Ketoconazole Cream 2% Discharge Instruction: Apply to all red areas/periwound Zinc Oxide Ointment 30g tube Discharge Instruction: Apply Zinc Oxide over Ketoconazole Topical Primary Dressing Maxorb Extra Calcium Alginate Dressing, 4x4 in Discharge Instruction: Apply calcium alginate to wound bed as instructed. Please apply to all excoriated areas-you may use a whole piece of alginate to do that. Secondary Dressing ABD Pad, 5x9 Discharge Instruction: Apply over primary dressing as directed. Secured With 12M Medipore H Soft Cloth Surgical T ape, 4 x 10 (in/yd) Discharge Instruction: Secure with tape as directed. Compression Wrap Compression Stockings Add-Ons Electronic Signature(s) Signed: 09/03/2021 5:44:04 PM By: Lorrin Jackson Signed: 09/04/2021 1:40:06 PM By: Deon Pilling RN, BSN Entered By: Lorrin Jackson on 09/03/2021  13:43:54 -------------------------------------------------------------------------------- Vitals Details Patient Name: Date of Service: Katrina Amel D. 09/03/2021 1:00 PM Medical Record Number: [id number] Patient Account Number: [id number] Date of Birth/Sex: Treating RN: [birth year] (88 y.o. [name], Meta.Reding Primary Care Olufemi [name]: [name] Other Clinician: Referring Masa Lubin: Treating Kelilah Hebard/Extender: [name] in Treatment: 10 Vital Signs Time Taken: 13:33 Temperature (F): 98.0 Height (in): 61 Pulse (bpm): 66 Weight (lbs): 221 Respiratory Rate (breaths/min): 17 Body Mass Index (BMI): 41.8 Blood Pressure (mmHg): 161/66 Reference Range: 80 - 120 mg / dl Electronic Signature(s) Signed: 09/04/2021 9:17:29 AM By: Sandre Kitty Entered By: Sandre Kitty on 09/03/2021 13:33:52

## 2021-09-17 ENCOUNTER — Other Ambulatory Visit: Payer: Self-pay

## 2021-09-17 ENCOUNTER — Encounter (HOSPITAL_BASED_OUTPATIENT_CLINIC_OR_DEPARTMENT_OTHER): Payer: Medicare Other | Admitting: Internal Medicine

## 2021-09-17 DIAGNOSIS — I87333 Chronic venous hypertension (idiopathic) with ulcer and inflammation of bilateral lower extremity: Secondary | ICD-10-CM | POA: Diagnosis not present

## 2021-09-17 NOTE — Progress Notes (Signed)
TU, SHIMMEL ([id number]) Visit Report for 09/17/2021 Arrival Information Details Patient Name: Date of Service: Katrina [name], BURKMAN D. 09/17/2021 1:30 PM Medical Record Number: [id number] Patient Account Number: [id number] Date of Birth/Sex: Treating RN: 1932/04/07 (88 y.o. Katrina [name], Katrina [name] Primary Care Evans Levee: Katrina [name] Other Clinician: Referring Nami Strawder: Treating Geraldin Habermehl/Extender: [name] in Treatment: 12 Visit Information History Since Last Visit Added or deleted any medications: No Patient Arrived: Katrina [name] Any new allergies or adverse reactions: No Arrival Time: 13:53 Had a fall or experienced change in No Accompanied By: self activities of daily living that may affect Transfer Assistance: Manual risk of falls: Patient Identification Verified: Yes Signs or symptoms of abuse/neglect since last visito No Secondary Verification Process Completed: Yes Hospitalized since last visit: No Patient Requires Transmission-Based Precautions: No Implantable device outside of the clinic excluding No Patient Has Alerts: Yes cellular tissue based products placed in the center Patient Alerts: Patient on Blood Thinner since last visit: Has Dressing in Place as Prescribed: Yes Pain Present Now: No Electronic Signature(s) Signed: 09/17/2021 3:23:49 PM By: Katrina [name] Entered By: Katrina [name] on 09/17/2021 13:54:06 -------------------------------------------------------------------------------- Clinic Level of Care Assessment Details Patient Name: Date of Service: Katrina [name], Katrina [name] 09/17/2021 1:30 PM Medical Record Number: [id number] Patient Account Number: [id number] Date of Birth/Sex: Treating RN: Jul 31, 1932 (88 y.o. Katrina [name], Lauren Primary Care Sherill Mangen: Katrina [name] Other Clinician: Referring Katrina [name]: Treating Katrina [name]/Extender: [name] in Treatment: 12 Clinic Level of Care Assessment Items TOOL 4 Quantity  Score X- 1 0 Use when only an EandM is performed on FOLLOW-UP visit ASSESSMENTS - Nursing Assessment / Reassessment X- 1 10 Reassessment of Co-morbidities (includes updates in patient status) X- 1 5 Reassessment of Adherence to Treatment Plan ASSESSMENTS - Wound and Skin A ssessment / Reassessment X - Simple Wound Assessment / Reassessment - one wound 1 5 []  - 0 Complex Wound Assessment / Reassessment - multiple wounds []  - 0 Dermatologic / Skin Assessment (not related to wound area) ASSESSMENTS - Focused Assessment []  - 0 Circumferential Edema Measurements - multi extremities []  - 0 Nutritional Assessment / Counseling / Intervention []  - 0 Lower Extremity Assessment (monofilament, tuning fork, pulses) []  - 0 Peripheral Arterial Disease Assessment (using hand held doppler) ASSESSMENTS - Ostomy and/or Continence Assessment and Care []  - 0 Incontinence Assessment and Management []  - 0 Ostomy Care Assessment and Management (repouching, etc.) PROCESS - Coordination of Care X - Simple Patient / Family Education for ongoing care 1 15 []  - 0 Complex (extensive) Patient / Family Education for ongoing care X- 1 10 Staff obtains Programmer, systems, Records, T Results / Process Orders est []  - 0 Staff telephones HHA, Nursing Homes / Clarify orders / etc []  - 0 Routine Transfer to another Facility (non-emergent condition) []  - 0 Routine Hospital Admission (non-emergent condition) []  - 0 New Admissions / Biomedical engineer / Ordering NPWT Apligraf, etc. , []  - 0 Emergency Hospital Admission (emergent condition) X- 1 10 Simple Discharge Coordination []  - 0 Complex (extensive) Discharge Coordination PROCESS - Special Needs []  - 0 Pediatric / Minor Patient Management []  - 0 Isolation Patient Management []  - 0 Hearing / Language / Visual special needs []  - 0 Assessment of Community assistance (transportation, D/C planning, etc.) []  - 0 Additional assistance / Altered  mentation []  - 0 Support Surface(s) Assessment (bed, cushion, seat, etc.) INTERVENTIONS - Wound Cleansing / Measurement X - Simple Wound Cleansing - one wound 1 5 []  - 0 Complex Wound  Cleansing - multiple wounds X- 1 5 Wound Imaging (photographs - any number of wounds) []  - 0 Wound Tracing (instead of photographs) X- 1 5 Simple Wound Measurement - one wound []  - 0 Complex Wound Measurement - multiple wounds INTERVENTIONS - Wound Dressings []  - 0 Small Wound Dressing one or multiple wounds []  - 0 Medium Wound Dressing one or multiple wounds []  - 0 Large Wound Dressing one or multiple wounds []  - 0 Application of Medications - topical []  - 0 Application of Medications - injection INTERVENTIONS - Miscellaneous []  - 0 External ear exam []  - 0 Specimen Collection (cultures, biopsies, blood, body fluids, etc.) []  - 0 Specimen(s) / Culture(s) sent or taken to Lab for analysis []  - 0 Patient Transfer (multiple staff / Civil Service fast streamer / Similar devices) []  - 0 Simple Staple / Suture removal (25 or less) []  - 0 Complex Staple / Suture removal (26 or more) []  - 0 Hypo / Hyperglycemic Management (close monitor of Blood Glucose) []  - 0 Ankle / Brachial Index (ABI) - do not check if billed separately X- 1 5 Vital Signs Has the patient been seen at the hospital within the last three years: Yes Total Score: 75 Level Of Care: New/Established - Level 2 Electronic Signature(s) Signed: 09/17/2021 5:37:29 PM By: Rhae Hammock RN Entered By: Rhae Hammock on 09/17/2021 17:31:01 -------------------------------------------------------------------------------- Encounter Discharge Information Details Patient Name: Date of Service: Katrina [name] D. 09/17/2021 1:30 PM Medical Record Number: [id number] Patient Account Number: [id number] Date of Birth/Sex: Treating RN: 02/22/1932 (88 y.o. Katrina [name], Lauren Primary Care Katrina [name]: Katrina [name] Other Clinician: Referring  Katrina [name]: Treating Katrina [name]/Extender: [name] in Treatment: 12 Encounter Discharge Information Items Discharge Condition: Stable Ambulatory Status: Cane Discharge Destination: Home Transportation: Private Auto Accompanied By: self Schedule Follow-up Appointment: Yes Clinical Summary of Care: Patient Declined Electronic Signature(s) Signed: 09/17/2021 5:37:29 PM By: Rhae Hammock RN Entered By: Rhae Hammock on 09/17/2021 17:31:46 -------------------------------------------------------------------------------- Multi Wound Chart Details Patient Name: Date of Service: Katrina [name] D. 09/17/2021 1:30 PM Medical Record Number: [id number] Patient Account Number: [id number] Date of Birth/Sex: Treating RN: April 05, 1932 (88 y.o. Katrina [name], Meta.Reding Primary Care Jonita Hirota: Katrina [name] Other Clinician: Referring Tayloranne Lekas: Treating Edilia Ghuman/Extender: [name] in Treatment: 12 Vital Signs Height(in): 61 Pulse(bpm): 70 Weight(lbs): 221 Blood Pressure(mmHg): 152/79 Body Mass Index(BMI): 41.8 Temperature(F): 98.1 Respiratory Rate(breaths/min): 17 Photos: [13:Left Gluteus] [N/A:N/A N/A] Wound Location: [13:Gradually Appeared] [N/A:N/A] Wounding Event: [13:Pressure Ulcer] [N/A:N/A] Primary Etiology: [13:Sleep Apnea, Arrhythmia,] [N/A:N/A] Comorbid History: [13:Hypertension, Phlebitis 10/21/2020] [N/A:N/A] Date Acquired: [13:12] [N/A:N/A] Weeks of Treatment: [13:Open] [N/A:N/A] Wound Status: [13:No] [N/A:N/A] Wound Recurrence: [13:Yes] [N/A:N/A] Clustered Wound: [13:2] [N/A:N/A] Clustered Quantity: [13:0x0x0] [N/A:N/A] Measurements L x W x D (cm) [13:0] [N/A:N/A] A (cm) : rea [13:0] [N/A:N/A] Volume (cm) : [13:100.00%] [N/A:N/A] % Reduction in A rea: [13:100.00%] [N/A:N/A] % Reduction in Volume: [13:Category/Stage II] [N/A:N/A] Classification: [13:Medium] [N/A:N/A] Exudate A mount: [13:Serosanguineous]  [N/A:N/A] Exudate Type: [13:red, brown] [N/A:N/A] Exudate Color: [13:Distinct, outline attached] [N/A:N/A] Wound Margin: [13:Large (67-100%)] [N/A:N/A] Granulation A mount: [13:Red] [N/A:N/A] Granulation Quality: [13:None Present (0%)] [N/A:N/A] Necrotic A mount: [13:Fat Layer (Subcutaneous Tissue): Yes N/A] Exposed Structures: [13:Fascia: No Tendon: No Muscle: No Joint: No Bone: No Large (67-100%)] [N/A:N/A] Treatment Notes Electronic Signature(s) Signed: 09/17/2021 6:02:21 PM By: Deon Pilling RN, BSN Signed: 09/17/2021 6:04:31 PM By: Linton Ham MD Entered By: Linton Ham on 09/17/2021 14:57:07 -------------------------------------------------------------------------------- Multi-Disciplinary Care Plan Details Patient Name: Date of Service: Katrina [name] D. 09/17/2021 1:30 PM  Medical Record Number: [id number] Patient Account Number: [id number] Date of Birth/Sex: Treating RN: Dec 31, 1931 (88 y.o. Katrina [name], Lauren Primary Care Bekah Igoe: Katrina [name] Other Clinician: Referring Jesper Stirewalt: Treating Jeannelle Wiens/Extender: [name] in Treatment: Salvo reviewed with physician Active Inactive Electronic Signature(s) Signed: 09/17/2021 5:37:29 PM By: Rhae Hammock RN Entered By: Rhae Hammock on 09/17/2021 14:21:52 -------------------------------------------------------------------------------- Pain Assessment Details Patient Name: Date of Service: Katrina [name] D. 09/17/2021 1:30 PM Medical Record Number: [id number] Patient Account Number: [id number] Date of Birth/Sex: Treating RN: 02-03-1932 (88 y.o. [name] Primary Care Shivangi Lutz: Katrina [name] Other Clinician: Referring Kolbee Stallman: Treating Nasiah Lehenbauer/Extender: [name] in Treatment: 12 Active Problems Location of Pain Severity and Description of Pain Patient Has Paino No Site Locations Pain Management and  Medication Current Pain Management: Electronic Signature(s) Signed: 09/17/2021 3:23:49 PM By: Katrina [name] Signed: 09/17/2021 6:02:21 PM By: Deon Pilling RN, BSN Entered By: Katrina [name] on 09/17/2021 13:54:44 -------------------------------------------------------------------------------- Patient/Caregiver Education Details Patient Name: Date of Service: Katrina [name] 1/26/2023andnbsp1:30 PM Medical Record Number: [id number] Patient Account Number: [id number] Date of Birth/Gender: Treating RN: [birth year] (88 y.o. Katrina [name], Lauren Primary Care Physician: Katrina [name] Other Clinician: Referring Physician: Treating Physician/Extender: [name] in Treatment: 12 Education Assessment Education Provided To: Patient Education Topics Provided Wound/Skin Impairment: Methods: Explain/Verbal Responses: State content correctly [employer]) Signed: 09/17/2021 5:37:29 PM By: Rhae Hammock RN Entered By: Rhae Hammock on 09/17/2021 17:30:19 -------------------------------------------------------------------------------- Wound Assessment Details Patient Name: Date of Service: Katrina [name] D. 09/17/2021 1:30 PM Medical Record Number: [id number] Patient Account Number: [id number] Date of Birth/Sex: Treating RN: 10/12/1931 (88 y.o. Katrina [name], Meta.Reding Primary Care Anny Sayler: Katrina [name] Other Clinician: Referring Khalil Belote: Treating Enes Wegener/Extender: [name] in Treatment: 12 Wound Status Wound Number: 13 Primary Etiology: Pressure Ulcer Wound Location: Left Gluteus Wound Status: Open Wounding Event: Gradually Appeared Comorbid History: Sleep Apnea, Arrhythmia, Hypertension, Phlebitis Date Acquired: 10/21/2020 Weeks Of Treatment: 12 Clustered Wound: Yes Photos Wound Measurements Length: (cm) 0 Width: (cm) 0 Depth: (cm) 0 Clustered Quantity: 2 Area: (cm) 0 Volume: (cm) 0 % Reduction in  Area: 100% % Reduction in Volume: 100% Epithelialization: Large (67-100%) Wound Description Classification: Category/Stage II Fou Wound Margin: Distinct, outline attached Slo Exudate Amount: Medium Exudate Type: Serosanguineous Exudate Color: red, brown l Odor After Cleansing: No ugh/Fibrino No Wound Bed Granulation Amount: Large (67-100%) Exposed Structure Granulation Quality: Red Fascia Exposed: No Necrotic Amount: None Present (0%) Fat Layer (Subcutaneous Tissue) Exposed: Yes Tendon Exposed: No Muscle Exposed: No Joint Exposed: No Bone Exposed: No Treatment Notes Wound #13 (Gluteus) Wound Laterality: Left Cleanser Peri-Wound Care Topical Primary Dressing Secondary Dressing Secured With Compression Wrap Compression Stockings Add-Ons Electronic Signature(s) Signed: 09/17/2021 3:23:49 PM By: Katrina [name] Signed: 09/17/2021 6:02:21 PM By: Deon Pilling RN, BSN Entered By: Katrina [name] on 09/17/2021 14:04:13 -------------------------------------------------------------------------------- [place] Details Patient Name: Date of Service: Katrina [name] D. 09/17/2021 1:30 PM Medical Record Number: [id number] Patient Account Number: [id number] Date of Birth/Sex: Treating RN: 03-21-1932 (88 y.o. Katrina [name], Meta.Reding Primary Care Bettyanne Dittman: Katrina [name] Other Clinician: Referring Sebrena Engh: Treating Jacky Dross/Extender: [name] in Treatment: 12 Vital Signs Time Taken: 13:54 Temperature (F): 98.1 Height (in): 61 Pulse (bpm): 70 Weight (lbs): 221 Respiratory Rate (breaths/min): 17 Body Mass Index (BMI): 41.8 Blood Pressure (mmHg): 152/79 Reference Range: 80 - 120 mg / dl Electronic Signature(s) Signed: 09/17/2021 3:23:49 PM By: Katrina [name] Entered By: Katrina [name] on 09/17/2021 13:54:28

## 2021-09-17 NOTE — Progress Notes (Signed)
Katrina, [name] ([id number]) Visit Report for 09/17/2021 HPI Details Patient Name: Date of Service: Katrina, [name] D. 09/17/2021 1:30 PM Medical Record Number: [id number] Patient Account Number: [id number] Date of Birth/Sex: Treating RN: 12/30/1931 (88 y.o. Katrina [name] Primary Care Provider: [name] Other Clinician: Referring Provider: Treating Provider/Extender: [name] in Treatment: 12 History of Present Illness Location: right lower extremity Quality: Patient reports experiencing a dull pain to affected area(s). Severity: Patient states wound(s) are getting better. Duration: Patient has had the wound for 3 months prior to presenting for treatment Context: The wound would happen gradually Modifying Factors: Patient wound(s)/ulcer(s) are worsening due to :standing for a long while. HPI Description: Past medical history of pulmonary emboli, hypothyroidism, hyperlipidemia, hypertension, sleep apnea, gallstones, ulcerated lower extremities, atrial fibrillation, status post total knee replacement, cholecystectomy, cystoscopy and stent placement for kidney stones. She has never been a smoker. About a year ago she was seen by [name] with an ulcer of her right lower extremity which she's had for about over 3 months. On 10/23/14 -- Lower extremity venous duplex evaluation. She had no evidence of deep venous thromboses involving both lower extremities. There was evidence of deep vein reflux in the right and left common femoral veins. She also had evidence of great saphenous vein reflux noted only at the level of the saphenofemoral junction. No evidence of greater or small saphenous vein reflux. She will need a consult to the vascular surgeon. Seen by Dr. Mathis Fare office note dated 11/13/2014. He noted that she had good arterial blood flow with palpable DP pulses bilaterally. Based on the patient's history Dr. Scot Dock recommended elevation when at rest of both feet and  knees and ankles above heart level in the supine position. Activity such as walking and pool exercises were recommended. She will also have 15-20 mm thigh-high compression stockings daily once the ulcers have completely healed. A prescription was given to her. 11/04/2015 -- the wound on her right lower extremity has completely healed and her edema has gone down significantly. However in her popliteal fossa where she's got a lot of fungal infection there is an area which is now an open wound and this will need attention. 12/02/2015 -- the right lower extremity looks much better than the edema has gone down significantly however she continues to have significant edema on the left side today. 12/09/2015 -- the patient has been doing her dressings as advised and says that overall she feels much better. She has received her juxta light for the left lower extremity READMISSION 06/25/2021 This is a patient who is actually had several previous visits to our clinic in 2014, 2016 and more recently 2017. This was largely because of wounds on her lower legs in the setting of chronic venous insufficiency and secondary lymphedema. Looking at my records she was apparently prescribed thigh-high compression stockings probably by vein and vascular although she comes back in not having worn any stockings in quite a period of time. She is now in the assisted living part of [facility]. She thinks her wounds have been there since April. This includes the left lateral, right anterior and right posterior lateral parts of both lower legs. She also has a stage II pressure ulcer on her left glued I think which is happened because of sleeping in a lift chair. She had ABDs and kerlix on her bilateral lower legs. Past medical history reviewed she has hypothyroidism, left buttock pressure ulcer dating back to March  of this year, iron deficiency, falls, paroxysmal atrial fibrillation anticoagulated  and chronic venous insufficiency ABIs in our clinic were 0.86 on the right and 0.96 on the left 11/10; patient comes into clinic today after being admitted last week with uncontrolled lymphedema, severe venous insufficiency and some superficial wounds on her bilateral lower legs. We put her in 3 layer compression and she comes in today with all the wounds closed and considerable improvement in the condition of her lower legs. The patient is going to need lifelong compression stockings probably juxta lites and we are going to go ahead and order those for her today. Unfortunately the POA here is a niece in [place] were probably going to have to talk with her to arrange proper payment for the juxta lite stockings. She is in the assisted living level of [facility]. She does not feel she can get the stockings on independently I am hopeful that the nurse and aids at the assisted living can help her with this otherwise this may be a problem. She also has an area on her left buttock which is a stage II wound. A lot of unhealthy irritated skin around this I think from moisture prolonged sittingo Incontinence 11/17; the patient's area on the left buttock is just about closed although there is still a lot of unhealthy looking discolored skin around a large area here. I suspect this is chronic moisture, pressure, perhaps incontinence. she has minuscule open areas on her bilateral lower legs. Apparently our intermediary did not get the order for juxta lite stockings therefore she does not have any. 12/1; both leg wounds are healed and she has been wearing bilateral juxta lite stockings for 2 days. [facility] are assisting her with this. The left buttock wound unfortunately has deteriorated 12/15; 2-week follow-up. Her legs remain healed she has the other area which is on her left buttock. This is surrounded circumferentially by chronically irritated skin. Mirror image on the right  side as well. I suspect this is chronic pressure friction and moisture from incontinence. This makes it difficult to get this area to completely close. We have been using silver alginate 12/29 both her legs remain closed and she has bilateral juxta lite stockings. The real problem here is her left buttock she has a small wound in the mid part of a extremely irritated dermatitis. It almost looks bruised in spots. There is superficial loss of epithelium. The actual wound that we have been dealing with does not look so bad. I have been assuming this is some combination of friction pressure wetness from constant urinary incontinence. If there is a dermatologic issue outside of that here I am not seeing it 09/03/2021; patient with a small area on the left buttock. The real problem here is a marked surrounding erythematous dermatitis with loss of surface epithelium. It looks as though she is beginning to develop satellite lesions this may be mostly fungal 1/26; the area on her left buttock is closed however she still has very irritated macerated skin in this area. Some of this appears fungal. I treated this with ketoconazole last time and I think it looks somewhat better although there is still some area present that still looks like a fungal dermatitis. Everything on the right buttock looks a lot better. Electronic Signature(s) Signed: 09/17/2021 6:04:31 PM By: Linton Ham MD Entered By: Linton Ham on 09/17/2021 14:59:24 -------------------------------------------------------------------------------- Physical Exam Details Patient Name: Date of Service: Katrina Amel D. 09/17/2021 1:30 PM  Medical Record Number: [id number] Patient Account Number: [id number] Date of Birth/Sex: Treating RN: [birth year] (88 y.o. Katrina [name] Primary Care Provider: [name] Other Clinician: Referring Provider: Treating Provider/Extender: [name] in Treatment:  12 Constitutional Patient is hypertensive.. Pulse regular and within target range for patient.Marland Kitchen Respirations regular, non-labored and within target range.. Temperature is normal and within the target range for the patient.Marland Kitchen Appears in no distress. Respiratory work of breathing is normal. Notes Wound exam; the left buttock wound is actually closed she still has the protruding area but no open wound. The skin around this is still irritated looking to me and perhaps still some smaller relevant element of fungal dermatitis but most of this looks quite good. Electronic Signature(s) Signed: 09/17/2021 6:04:31 PM By: Linton Ham MD Entered By: Linton Ham on 09/17/2021 15:00:32 -------------------------------------------------------------------------------- Physician Orders Details Patient Name: Date of Service: Katrina Amel D. 09/17/2021 1:30 PM Medical Record Number: [id number] Patient Account Number: [id number] Date of Birth/Sex: Treating RN: [birth year] (88 y.o. [name], Lauren Primary Care Provider: [name] Other Clinician: Referring Provider: Treating Provider/Extender: [name] in Treatment: 12 Verbal / Phone Orders: No Diagnosis Coding Follow-up Appointments Other: - No need to follow up!!:) Discharge From [facility] Services Discharge from [place] [facility] Turn and reposition every 2 hours Non Wound Condition Protect area with: - zinc oxide and ketoconazole Wound Treatment Electronic Signature(s) Signed: 09/17/2021 5:37:29 PM By: Rhae Hammock RN Signed: 09/17/2021 6:04:31 PM By: Linton Ham MD Entered By: Rhae Hammock on 09/17/2021 14:21:24 -------------------------------------------------------------------------------- Problem List Details Patient Name: Date of Service: Katrina Amel D. 09/17/2021 1:30 PM Medical Record Number: [id number] Patient Account Number: [id number] Date of Birth/Sex: Treating  RN: [birth year] (88 y.o. Katrina [name], Katrina [name] Primary Care Provider: [name] Other Clinician: Referring Provider: Treating Provider/Extender: [name] in Treatment: 12 Active Problems ICD-10 Encounter Code Description Active Date MDM Diagnosis I87.333 Chronic venous hypertension (idiopathic) with ulcer and inflammation of 06/25/2021 No Yes bilateral lower extremity I89.0 Lymphedema, not elsewhere classified 06/25/2021 No Yes L89.322 Pressure ulcer of left buttock, stage 2 06/25/2021 No Yes Inactive Problems ICD-10 Code Description Active Date Inactive Date L97.821 Non-pressure chronic ulcer of other part of left lower leg limited to breakdown of skin 06/25/2021 06/25/2021 L97.811 Non-pressure chronic ulcer of other part of right lower leg limited to breakdown of skin 06/25/2021 06/25/2021 Resolved Problems Electronic Signature(s) Signed: 09/17/2021 6:04:31 PM By: Linton Ham MD Entered By: Linton Ham on 09/17/2021 14:56:59 -------------------------------------------------------------------------------- Progress Note Details Patient Name: Date of Service: Katrina Amel D. 09/17/2021 1:30 PM Medical Record Number: [id number] Patient Account Number: [id number] Date of Birth/Sex: Treating RN: 1932/04/24 (88 y.o. Katrina [name] Primary Care Provider: [name] Other Clinician: Referring Provider: Treating Provider/Extender: [name] in Treatment: 12 Subjective History of Present Illness (HPI) The following HPI elements were documented for the patient's wound: Location: right lower extremity Quality: Patient reports experiencing a dull pain to affected area(s). Severity: Patient states wound(s) are getting better. Duration: Patient has had the wound for 3 months prior to presenting for treatment Context: The wound would happen gradually Modifying Factors: Patient wound(s)/ulcer(s) are worsening due to  :standing for a long while. Past medical history of pulmonary emboli, hypothyroidism, hyperlipidemia, hypertension, sleep apnea, gallstones, ulcerated lower extremities, atrial fibrillation, status post total knee replacement, cholecystectomy, cystoscopy and stent placement for kidney stones. She has never been a smoker. About a year ago she was seen by  [name] with an ulcer of her right lower extremity which she's had for about over 3 months. On 10/23/14 -- Lower extremity venous duplex evaluation. She had no evidence of deep venous thromboses involving both lower extremities. There was evidence of deep vein reflux in the right and left common femoral veins. She also had evidence of great saphenous vein reflux noted only at the level of the saphenofemoral junction. No evidence of greater or small saphenous vein reflux. She will need a consult to the vascular surgeon. Seen by Dr. Mathis Fare office note dated 11/13/2014. He noted that she had good arterial blood flow with palpable DP pulses bilaterally. Based on the patient's history Dr. Scot Dock recommended elevation when at rest of both feet and knees and ankles above heart level in the supine position. Activity such as walking and pool exercises were recommended. She will also have 15-20 mm thigh-high compression stockings daily once the ulcers have completely healed. A prescription was given to her. 11/04/2015 -- the wound on her right lower extremity has completely healed and her edema has gone down significantly. However in her popliteal fossa where she's got a lot of fungal infection there is an area which is now an open wound and this will need attention. 12/02/2015 -- the right lower extremity looks much better than the edema has gone down significantly however she continues to have significant edema on the left side today. 12/09/2015 -- the patient has been doing her dressings as advised and says that overall she feels much better. She has received  her juxta light for the left lower extremity READMISSION 06/25/2021 This is a patient who is actually had several previous visits to our clinic in 2014, 2016 and more recently 2017. This was largely because of wounds on her lower legs in the setting of chronic venous insufficiency and secondary lymphedema. Looking at my records she was apparently prescribed thigh-high compression stockings probably by vein and vascular although she comes back in not having worn any stockings in quite a period of time. She is now in the assisted living part of [facility]. She thinks her wounds have been there since April. This includes the left lateral, right anterior and right posterior lateral parts of both lower legs. She also has a stage II pressure ulcer on her left glued I think which is happened because of sleeping in a lift chair. She had ABDs and kerlix on her bilateral lower legs. Past medical history reviewed she has hypothyroidism, left buttock pressure ulcer dating back to March of this year, iron deficiency, falls, paroxysmal atrial fibrillation anticoagulated and chronic venous insufficiency ABIs in our clinic were 0.86 on the right and 0.96 on the left 11/10; patient comes into clinic today after being admitted last week with uncontrolled lymphedema, severe venous insufficiency and some superficial wounds on her bilateral lower legs. We put her in 3 layer compression and she comes in today with all the wounds closed and considerable improvement in the condition of her lower legs. The patient is going to need lifelong compression stockings probably juxta lites and we are going to go ahead and order those for her today. Unfortunately the POA here is a niece in [place] were probably going to have to talk with her to arrange proper payment for the juxta lite stockings. She is in the assisted living level of [facility]. She does not feel she can get the  stockings on independently I am hopeful that the nurse and  aids at the assisted living can help her with this otherwise this may be a problem. She also has an area on her left buttock which is a stage II wound. A lot of unhealthy irritated skin around this I think from moisture prolonged sittingo Incontinence 11/17; the patient's area on the left buttock is just about closed although there is still a lot of unhealthy looking discolored skin around a large area here. I suspect this is chronic moisture, pressure, perhaps incontinence. she has minuscule open areas on her bilateral lower legs. Apparently our intermediary did not get the order for juxta lite stockings therefore she does not have any. 12/1; both leg wounds are healed and she has been wearing bilateral juxta lite stockings for 2 days. [facility] are [facility] her with this. The left buttock wound unfortunately has deteriorated 12/15; 2-week follow-up. Her legs remain healed she has the other area which is on her left buttock. This is surrounded circumferentially by chronically irritated skin. Mirror image on the right side as well. I suspect this is chronic pressure friction and moisture from incontinence. This makes it difficult to get this area to completely close. We have been using silver alginate 12/29 both her legs remain closed and she has bilateral juxta lite stockings. The real problem here is her left buttock she has a small wound in the mid part of a extremely irritated dermatitis. It almost looks bruised in spots. There is superficial loss of epithelium. The actual wound that we have been dealing with does not look so bad. I have been assuming this is some combination of friction pressure wetness from constant urinary incontinence. If there is a dermatologic issue outside of that here I am not seeing it 09/03/2021; patient with a small area on the left buttock. The real problem here is a marked surrounding  erythematous dermatitis with loss of surface epithelium. It looks as though she is beginning to develop satellite lesions this may be mostly fungal 1/26; the area on her left buttock is closed however she still has very irritated macerated skin in this area. Some of this appears fungal. I treated this with ketoconazole last time and I think it looks somewhat better although there is still some area present that still looks like a fungal dermatitis. Everything on the right buttock looks a lot better. Objective Constitutional Patient is hypertensive.. Pulse regular and within target range for patient.Marland Kitchen Respirations regular, non-labored and within target range.. Temperature is normal and within the target range for the patient.Marland Kitchen Appears in no distress. Vitals Time Taken: 1:54 PM, Height: 61 in, Weight: 221 lbs, BMI: 41.8, Temperature: 98.1 F, Pulse: 70 bpm, Respiratory Rate: 17 breaths/min, Blood Pressure: 152/79 mmHg. Respiratory work of breathing is normal. General Notes: Wound exam; the left buttock wound is actually closed she still has the protruding area but no open wound. The skin around this is still irritated looking to me and perhaps still some smaller relevant element of fungal dermatitis but most of this looks quite good. Integumentary (Hair, Skin) Wound #13 status is Open. Original cause of wound was Gradually Appeared. The date acquired was: 10/21/2020. The wound has been in treatment 12 weeks. The wound is located on the Left Gluteus. The wound measures 0cm length x 0cm width x 0cm depth; 0cm^2 area and 0cm^3 volume. There is Fat Layer (Subcutaneous Tissue) exposed. There is a medium amount of serosanguineous drainage noted. The wound margin is distinct with the outline attached to the wound base.  There is large (67-100%) red granulation within the wound bed. There is no necrotic tissue within the wound bed. Assessment Active Problems ICD-10 Chronic venous hypertension (idiopathic)  with ulcer and inflammation of bilateral lower extremity Lymphedema, not elsewhere classified Pressure ulcer of left buttock, stage 2 Plan Follow-up Appointments: Other: - No need to follow up!!:) Discharge From [facility] Services: Discharge from [place] Off-Loading: Turn and reposition every 2 hours Non Wound Condition: Protect area with: - zinc oxide and ketoconazole 1. The patient does not have an open wound. I think this skin in this area is simply traumatized by continued pressure, warmth, moisture from incontinence. I think she is going to have trouble with this area going forward. I do not know that there is a specific reason for her to come back here unless she develops a wide open sore. 2. I have discharged the patient from the clinic although I am not expecting the skin in these areas to stay intact Electronic Signature(s) Signed: 09/17/2021 6:04:31 PM By: Linton Ham MD Entered By: Linton Ham on 09/17/2021 15:01:58 -------------------------------------------------------------------------------- SuperBill Details Patient Name: Date of Service: Katrina Amel D. 09/17/2021 Medical Record Number: [id number] Patient Account Number: [id number] Date of Birth/Sex: Treating RN: Mar 04, 1932 (88 y.o. Katrina [name] Primary Care Provider: [name] Other Clinician: Referring Provider: Treating Provider/Extender: [name] in Treatment: 12 Diagnosis Coding ICD-10 Codes Code Description [phone] Chronic venous hypertension (idiopathic) with ulcer and inflammation of bilateral lower extremity I89.0 Lymphedema, not elsewhere classified L89.322 Pressure ulcer of left buttock, stage 2 Facility Procedures CPT4 Code: [id number] Description: [phone] - WOUND CARE VISIT-LEV 2 EST PT Modifier: Quantity: 1 Physician Procedures : CPT4 Code Description Modifier [id number] - WC PHYS LEVEL 2 - EST PT ICD-10 Diagnosis Description I87.333 Chronic  venous hypertension (idiopathic) with ulcer and inflammation of bilateral lower extremity L89.322 Pressure ulcer of left buttock, stage  2 Quantity: 1 Electronic Signature(s) Signed: 09/17/2021 5:37:29 PM By: Rhae Hammock RN Signed: 09/17/2021 6:04:31 PM By: Linton Ham MD Entered By: Rhae Hammock on 09/17/2021 17:31:10

## 2021-09-26 ENCOUNTER — Emergency Department (HOSPITAL_COMMUNITY): Payer: Medicare Other

## 2021-09-26 ENCOUNTER — Other Ambulatory Visit: Payer: Self-pay

## 2021-09-26 ENCOUNTER — Encounter (HOSPITAL_COMMUNITY)
Admission: EM | Disposition: A | Discharge status: Skilled Nursing Facility | Payer: Self-pay | Source: Home / Self Care | Attending: Internal Medicine

## 2021-09-26 ENCOUNTER — Encounter (HOSPITAL_COMMUNITY): Payer: Self-pay

## 2021-09-26 ENCOUNTER — Inpatient Hospital Stay (HOSPITAL_COMMUNITY)
Admission: EM | Admit: 2021-09-26 | Discharge: 2021-09-30 | DRG: 661 | Disposition: A | Discharge status: Skilled Nursing Facility | Payer: Medicare Other | Attending: Internal Medicine | Admitting: Internal Medicine

## 2021-09-26 DIAGNOSIS — E669 Obesity, unspecified: Secondary | ICD-10-CM | POA: Diagnosis present

## 2021-09-26 DIAGNOSIS — N136 Pyonephrosis: Principal | ICD-10-CM | POA: Diagnosis present

## 2021-09-26 DIAGNOSIS — E039 Hypothyroidism, unspecified: Secondary | ICD-10-CM | POA: Diagnosis present

## 2021-09-26 DIAGNOSIS — I10 Essential (primary) hypertension: Secondary | ICD-10-CM | POA: Diagnosis present

## 2021-09-26 DIAGNOSIS — Z885 Allergy status to narcotic agent status: Secondary | ICD-10-CM

## 2021-09-26 DIAGNOSIS — Z20822 Contact with and (suspected) exposure to covid-19: Secondary | ICD-10-CM | POA: Diagnosis present

## 2021-09-26 DIAGNOSIS — Z79899 Other long term (current) drug therapy: Secondary | ICD-10-CM | POA: Diagnosis not present

## 2021-09-26 DIAGNOSIS — G4733 Obstructive sleep apnea (adult) (pediatric): Secondary | ICD-10-CM

## 2021-09-26 DIAGNOSIS — Z881 Allergy status to other antibiotic agents status: Secondary | ICD-10-CM

## 2021-09-26 DIAGNOSIS — R5381 Other malaise: Secondary | ICD-10-CM

## 2021-09-26 DIAGNOSIS — Z7901 Long term (current) use of anticoagulants: Secondary | ICD-10-CM | POA: Diagnosis not present

## 2021-09-26 DIAGNOSIS — Z86711 Personal history of pulmonary embolism: Secondary | ICD-10-CM

## 2021-09-26 DIAGNOSIS — I480 Paroxysmal atrial fibrillation: Secondary | ICD-10-CM | POA: Diagnosis present

## 2021-09-26 DIAGNOSIS — G4730 Sleep apnea, unspecified: Secondary | ICD-10-CM | POA: Diagnosis present

## 2021-09-26 DIAGNOSIS — Z83438 Family history of other disorder of lipoprotein metabolism and other lipidemia: Secondary | ICD-10-CM | POA: Diagnosis not present

## 2021-09-26 DIAGNOSIS — Z86718 Personal history of other venous thrombosis and embolism: Secondary | ICD-10-CM | POA: Diagnosis not present

## 2021-09-26 DIAGNOSIS — Z6837 Body mass index (BMI) 37.0-37.9, adult: Secondary | ICD-10-CM | POA: Diagnosis not present

## 2021-09-26 DIAGNOSIS — Z96653 Presence of artificial knee joint, bilateral: Secondary | ICD-10-CM | POA: Diagnosis present

## 2021-09-26 DIAGNOSIS — Z79890 Hormone replacement therapy (postmenopausal): Secondary | ICD-10-CM

## 2021-09-26 DIAGNOSIS — N179 Acute kidney failure, unspecified: Secondary | ICD-10-CM | POA: Diagnosis present

## 2021-09-26 DIAGNOSIS — Z8249 Family history of ischemic heart disease and other diseases of the circulatory system: Secondary | ICD-10-CM | POA: Diagnosis not present

## 2021-09-26 DIAGNOSIS — Z8042 Family history of malignant neoplasm of prostate: Secondary | ICD-10-CM | POA: Diagnosis not present

## 2021-09-26 DIAGNOSIS — Z880 Allergy status to penicillin: Secondary | ICD-10-CM | POA: Diagnosis not present

## 2021-09-26 HISTORY — PX: CYSTOSCOPY W/ URETERAL STENT PLACEMENT: SHX1429

## 2021-09-26 LAB — URINALYSIS, ROUTINE W REFLEX MICROSCOPIC
Bilirubin Urine: NEGATIVE
Glucose, UA: NEGATIVE mg/dL
Ketones, ur: NEGATIVE mg/dL
Nitrite: NEGATIVE
Protein, ur: 100 mg/dL — AB
Specific Gravity, Urine: 1.025 (ref 1.005–1.030)
WBC, UA: >50 WBC/hpf — ABNORMAL HIGH (ref 0–5)
pH: 6 (ref 5.0–8.0)

## 2021-09-26 LAB — CBC
HCT: 40.3 % (ref 36.0–46.0)
Hemoglobin: 12.9 g/dL (ref 12.0–15.0)
MCH: 28.7 pg (ref 26.0–34.0)
MCHC: 32 g/dL (ref 30.0–36.0)
MCV: 89.8 fL (ref 80.0–100.0)
Platelets: 134 10*3/uL — ABNORMAL LOW (ref 150–400)
RBC: 4.49 MIL/uL (ref 3.87–5.11)
RDW: 17.9 % — ABNORMAL HIGH (ref 11.5–15.5)
WBC: 17.4 10*3/uL — ABNORMAL HIGH (ref 4.0–10.5)
nRBC: 0 % (ref 0.0–0.2)

## 2021-09-26 LAB — COMPREHENSIVE METABOLIC PANEL
ALT: 17 U/L (ref 0–44)
AST: 43 U/L — ABNORMAL HIGH (ref 15–41)
Albumin: 3.5 g/dL (ref 3.5–5.0)
Alkaline Phosphatase: 100 U/L (ref 38–126)
Anion gap: 11 (ref 5–15)
BUN: 33 mg/dL — ABNORMAL HIGH (ref 8–23)
CO2: 22 mmol/L (ref 22–32)
Calcium: 8.7 mg/dL — ABNORMAL LOW (ref 8.9–10.3)
Chloride: 100 mmol/L (ref 98–111)
Creatinine, Ser: 2.23 mg/dL — ABNORMAL HIGH (ref 0.44–1.00)
GFR, Estimated: 21 mL/min — ABNORMAL LOW (ref >60)
Glucose, Bld: 214 mg/dL — ABNORMAL HIGH (ref 70–99)
Potassium: 4.3 mmol/L (ref 3.5–5.1)
Sodium: 133 mmol/L — ABNORMAL LOW (ref 135–145)
Total Bilirubin: 1.4 mg/dL — ABNORMAL HIGH (ref 0.3–1.2)
Total Protein: 8.6 g/dL — ABNORMAL HIGH (ref 6.5–8.1)

## 2021-09-26 LAB — RESP PANEL BY RT-PCR (FLU A&B, COVID) ARPGX2
Influenza A by PCR: NEGATIVE
Influenza B by PCR: NEGATIVE
SARS Coronavirus 2 by RT PCR: NEGATIVE

## 2021-09-26 LAB — LIPASE, BLOOD: Lipase: 19 U/L (ref 11–51)

## 2021-09-26 LAB — DIGOXIN LEVEL: Digoxin Level: 1.7 ng/mL (ref 0.8–2.0)

## 2021-09-26 SURGERY — CYSTOSCOPY, WITH RETROGRADE PYELOGRAM AND URETERAL STENT INSERTION
Anesthesia: General | Site: Ureter | Laterality: Right

## 2021-09-26 MED ORDER — LACTATED RINGERS IV SOLN: INTRAVENOUS | Status: DC

## 2021-09-26 MED ORDER — ONDANSETRON HCL 4 MG PO TABS: 4.0000 mg | ORAL_TABLET | Freq: Four times a day (QID) | ORAL | Status: DC | PRN

## 2021-09-26 MED ORDER — CIPROFLOXACIN IN D5W 200 MG/100ML IV SOLN
200.0000 mg | Freq: Once | INTRAVENOUS | Status: DC
  Administered 2021-09-26: 200 mg via INTRAVENOUS
  Filled 2021-09-26: qty 100

## 2021-09-26 MED ORDER — PIPERACILLIN-TAZOBACTAM 3.375 G IVPB 30 MIN
3.3750 g | Freq: Three times a day (TID) | INTRAVENOUS | Status: DC
  Filled 2021-09-26: qty 50

## 2021-09-26 MED ORDER — CIPROFLOXACIN IN D5W 400 MG/200ML IV SOLN: 400.0000 mg | Freq: Once | INTRAVENOUS | Status: DC

## 2021-09-26 MED ORDER — ENOXAPARIN SODIUM 30 MG/0.3ML IJ SOSY: 30.0000 mg | PREFILLED_SYRINGE | INTRAMUSCULAR | Status: DC

## 2021-09-26 MED ORDER — ACETAMINOPHEN 325 MG PO TABS: 650.0000 mg | ORAL_TABLET | Freq: Four times a day (QID) | ORAL | Status: DC | PRN

## 2021-09-26 MED ORDER — ONDANSETRON HCL 4 MG/2ML IJ SOLN: 4.0000 mg | Freq: Four times a day (QID) | INTRAMUSCULAR | Status: DC | PRN

## 2021-09-26 MED ORDER — LACTATED RINGERS IV BOLUS
500.0000 mL | Freq: Once | INTRAVENOUS | Status: AC
  Administered 2021-09-26: 500 mL via INTRAVENOUS

## 2021-09-26 MED ORDER — PROPOFOL 10 MG/ML IV BOLUS
INTRAVENOUS | Status: AC
  Filled 2021-09-26: qty 20

## 2021-09-26 MED ORDER — ACETAMINOPHEN 650 MG RE SUPP: 650.0000 mg | Freq: Four times a day (QID) | RECTAL | Status: DC | PRN

## 2021-09-26 MED ORDER — FENTANYL CITRATE (PF) 100 MCG/2ML IJ SOLN
INTRAMUSCULAR | Status: AC
  Filled 2021-09-26: qty 2

## 2021-09-26 MED ORDER — PIPERACILLIN-TAZOBACTAM 3.375 G IVPB 30 MIN: 3.3750 g | Freq: Once | INTRAVENOUS | Status: AC

## 2021-09-26 SURGICAL SUPPLY — 16 items
BAG URO CATCHER STRL LF (MISCELLANEOUS) ×3 IMPLANT
BASKET ZERO TIP NITINOL 2.4FR (BASKET) IMPLANT
BSKT STON RTRVL ZERO TP 2.4FR (BASKET)
CATH FOLEY 2WAY SLVR  5CC 16FR (CATHETERS) ×2
CATH FOLEY 2WAY SLVR 5CC 16FR (CATHETERS) IMPLANT
CATH URETL OPEN END 6FR 70 (CATHETERS) IMPLANT
CLOTH BEACON ORANGE TIMEOUT ST (SAFETY) ×3 IMPLANT
GLOVE SURG ENC TEXT LTX SZ7.5 (GLOVE) ×3 IMPLANT
GOWN STRL REUS W/TWL XL LVL3 (GOWN DISPOSABLE) ×3 IMPLANT
GUIDEWIRE ANG ZIPWIRE 038X150 (WIRE) IMPLANT
GUIDEWIRE STR DUAL SENSOR (WIRE) ×3 IMPLANT
KIT TURNOVER KIT A (KITS) IMPLANT
MANIFOLD NEPTUNE II (INSTRUMENTS) ×3 IMPLANT
PACK CYSTO (CUSTOM PROCEDURE TRAY) ×3 IMPLANT
STENT URET 6FRX24 CONTOUR (STENTS) ×1 IMPLANT
TUBING CONNECTING 10 (TUBING) ×3 IMPLANT

## 2021-09-26 NOTE — ED Provider Notes (Addendum)
[facility] DEPT Provider Note   CSN: [id number] Arrival date & time: 09/26/21  1942     History  Chief Complaint  Patient presents with   Emesis    Katrina [name] is a 88 y.o. female.  88 year old female presents with several days of intermittent emesis not described as bloody or bilious.  Has had some abdominal distention but no fever.  No diarrhea.  No recent URI symptoms.  No urinary symptoms.  Presents via EMS for further evaluation      Home Medications Prior to Admission medications   Medication Sig Start Date End Date Taking? Authorizing Provider  apixaban (ELIQUIS) 2.5 MG TABS tablet Take 1 tablet by mouth 2 (two) times daily.    [provider]  atorvastatin (LIPITOR) 40 MG tablet Take 1 tablet by mouth daily.    [provider]  digoxin (LANOXIN) 0.25 MG tablet Take 1 tablet by mouth daily.    [provider]  doxycycline (VIBRA-TABS) 100 MG tablet Take 100 mg by mouth 2 (two) times daily. 10/02/20   [provider]  fluconazole (DIFLUCAN) 150 MG tablet 1 tablet    [provider]  fluconazole (DIFLUCAN) 150 MG tablet 1 tablet 12/01/20   [provider]  furosemide (LASIX) 20 MG tablet 1 tablet 02/04/21   [provider]  hydrALAZINE (APRESOLINE) 50 MG tablet Take 50 mg by mouth 3 (three) times daily. Twice daily    [provider]  levothyroxine (SYNTHROID) 125 MCG tablet Take 1 tablet by mouth daily.    [provider]  levothyroxine (SYNTHROID, LEVOTHROID) 125 MCG tablet Take 125 mcg by mouth daily.     [provider]  LOSARTAN POTASSIUM PO Take 25 mg by mouth.    [provider]  metoprolol succinate (TOPROL-XL) 100 MG 24 hr tablet Take 1 tablet by mouth daily.    [provider]  mupirocin ointment (BACTROBAN) 2 % Apply to affected area    [provider]  ondansetron (ZOFRAN) 4 MG tablet Take 4 mg by mouth every 8 (eight)  hours as needed for nausea or vomiting.    [provider]  potassium chloride (KLOR-CON) 10 MEQ tablet 1 tablet with food 02/04/21   [provider]  potassium citrate (UROCIT-K) 10 MEQ (1080 MG) SR tablet Take 10 mEq by mouth daily. 06/06/20   [provider]  Prenatal Vit-Fe Fumarate-FA (PRENATAL VITAMIN PLUS LOW IRON) 27-1 MG TABS 1 tablet 02/04/21   [provider]  warfarin (COUMADIN) 1 MG tablet Take 1 mg by mouth daily.    [provider]  warfarin (COUMADIN) 2 MG tablet Take by mouth. 04/02/20   [provider]      Allergies    Cephalosporins, Codeine, Penicillins, and Penicillin g    Review of Systems   Review of Systems  All other systems reviewed and are negative.  Physical Exam Updated Vital Signs BP (!) 156/106 (BP Location: Left Arm)    Pulse 98    Temp 99.2 F (37.3 C) (Oral)    Resp 20    Ht 1.549 m (5\' 1" )    Wt 90.7 kg    LMP 01/22/2011    SpO2 97%    BMI 37.79 kg/m  Physical Exam Vitals and nursing note reviewed.  Constitutional:      General: She is not in acute distress.    Appearance: Normal appearance. She is well-developed. She is not toxic-appearing.  HENT:  Head: Normocephalic and atraumatic.  Eyes:     General: Lids are normal.     Conjunctiva/sclera: Conjunctivae normal.     Pupils: Pupils are equal, round, and reactive to light.  Neck:     Thyroid: No thyroid mass.     Trachea: No tracheal deviation.  Cardiovascular:     Rate and Rhythm: Normal rate and regular rhythm.     Heart sounds: Normal heart sounds. No murmur heard.   No gallop.  Pulmonary:     Effort: Pulmonary effort is normal. No respiratory distress.     Breath sounds: Normal breath sounds. No stridor. No decreased breath sounds, wheezing, rhonchi or rales.  Abdominal:     General: There is distension.     Palpations: Abdomen is soft.     Tenderness: There is no abdominal tenderness. There is no rebound.  Musculoskeletal:         General: No tenderness. Normal range of motion.     Cervical back: Normal range of motion and neck supple.  Skin:    General: Skin is warm and dry.     Findings: No abrasion or rash.  Neurological:     Mental Status: She is alert and oriented to person, place, and time. Mental status is at baseline.     GCS: GCS eye subscore is 4. GCS verbal subscore is 5. GCS motor subscore is 6.     Cranial Nerves: No cranial nerve deficit.     Sensory: No sensory deficit.     Motor: Motor function is intact.  Psychiatric:        Attention and Perception: Attention normal.        Speech: Speech normal.        Behavior: Behavior normal.    ED Results / Procedures / Treatments   Labs (all labs ordered are listed, but only abnormal results are displayed) Labs Reviewed  RESP PANEL BY RT-PCR (FLU A&B, COVID) ARPGX2  LIPASE, BLOOD  COMPREHENSIVE METABOLIC PANEL  CBC  URINALYSIS, ROUTINE W REFLEX MICROSCOPIC  DIGOXIN LEVEL    EKG None  Radiology No results found.  Procedures Procedures    Medications Ordered in ED Medications  lactated ringers bolus 500 mL (has no administration in time range)  lactated ringers infusion (has no administration in time range)    ED Course/ Medical Decision Making/ A&P                           Medical Decision Making Amount and/or Complexity of Data Reviewed Labs: ordered. Radiology: ordered.  Risk Prescription drug management.  Old records reviewed for this visit. Chest x-ray without evidence of pneumonia per my review Patient is urinalysis positive for infection here.  Also has evidence of acute kidney injury.  Given IV hydration as well as given renal dosed IV ciprofloxacin due to penicillin allergy  Abdominal CT shows evidence of 8 millimeters stone with extravasation per my review.  Will speak to with urology on-call as patient will likely need to go to the operating room. Final Clinical Impression(s) / ED Diagnoses Final diagnoses:   None    Rx / DC Orders ED Discharge Orders     None         Lacretia Leigh, MD 09/26/21 2251    Lacretia Leigh, MD 09/26/21 2300

## 2021-09-26 NOTE — ED Triage Notes (Signed)
Patient BIB [facility] from [facility]. Emesis for 2 days. No pain. Vomiting up bile.

## 2021-09-26 NOTE — Assessment & Plan Note (Addendum)
Likely postrenal from obstructive renal stone.  Improving.  Creatinine of 1.3 from 1.5 today.  Urine output adequate.  Encourage oral hydration.

## 2021-09-26 NOTE — Anesthesia Preprocedure Evaluation (Addendum)
Anesthesia Evaluation  Patient identified by MRN, date of birth, ID band Patient awake    Reviewed: Allergy & Precautions, NPO status , Patient's Chart, lab work & pertinent test results, reviewed documented beta blocker date and time   Airway Mallampati: IV  TM Distance: >3 FB Neck ROM: Full    Dental  (+) Dental Advisory Given, Chipped   Pulmonary sleep apnea and Continuous Positive Airway Pressure Ventilation ,    Pulmonary exam normal breath sounds clear to auscultation       Cardiovascular hypertension, Pt. on home beta blockers and Pt. on medications + DVT  + dysrhythmias Atrial Fibrillation  Rhythm:Regular Rate:Tachycardia     Neuro/Psych PSYCHIATRIC DISORDERS Depression negative neurological ROS     GI/Hepatic negative GI ROS, Neg liver ROS,   Endo/Other  Hypothyroidism Obesity   Renal/GU Renal disease (AKI)right kidney stone urinary tract infection     Musculoskeletal negative musculoskeletal ROS (+)   Abdominal   Peds  Hematology  (+) Blood dyscrasia (Eliquis; Thrombocytopenia), ,   Anesthesia Other Findings Day of surgery medications reviewed with the patient.  Reproductive/Obstetrics                            Anesthesia Physical Anesthesia Plan  ASA: 3 and emergent  Anesthesia Plan: General   Post-op Pain Management: Ofirmev IV (intra-op)   Induction: Intravenous  PONV Risk Score and Plan: 3 and Dexamethasone and Ondansetron  Airway Management Planned: LMA  Additional Equipment:   Intra-op Plan:   Post-operative Plan: Extubation in OR  Informed Consent: I have reviewed the patients History and Physical, chart, labs and discussed the procedure including the risks, benefits and alternatives for the proposed anesthesia with the patient or authorized representative who has indicated his/her understanding and acceptance.     Dental advisory given  Plan Discussed  with: CRNA  Anesthesia Plan Comments:         Anesthesia Quick Evaluation

## 2021-09-26 NOTE — H&P (Signed)
History and Physical    Patient: Katrina [name] DOB: November 19, 1931 DOA: 09/26/2021 DOS: the patient was seen and examined on 09/26/2021 PCP: Donnajean Lopes, MD  Patient coming from: [facility]  Chief Complaint:  Chief Complaint  Patient presents with   Emesis    HPI: Katrina [name] is a 88 y.o. female with medical history significant of PAF on eliquis, prior DVT and PE, HTN, prior renal stones.  Pt presents to ED with c/o N/V.  Denies subjective fever or urinary symptoms.  Denies URI symptoms, SOB.  In ED:  Work up in ED is worrisome for obstructive 53mm R UPJ stone with hydronephrosis, UTI on UA.  WBC 17k, Tachycardia, Tm 99.2.  AKI with creat 2.2 up from 0.8 baseline last Aug.  Review of Systems: As mentioned in the history of present illness. All other systems reviewed and are negative. Past Medical History:  Diagnosis Date   Atrial fibrillation ([place])    DVT (deep venous thrombosis) ([facility])    Elevated lipids    Gallstone    Hypertension    Hypothyroidism    Nephrolithiasis    Overactive bladder    Pulmonary emboli ([facility])    while on HRT   SCC (squamous cell carcinoma)    of left leg   Sleep apnea    on C-pap   Vertigo    Past Surgical History:  Procedure Laterality Date   CHOLECYSTECTOMY     Dr. Zenia Resides   CYSTOSCOPY WITH STENT PLACEMENT  9/12   nephrolithaisis   HYSTEROSCOPY WITH D & C  7/09   PMP with endometrial polyp   REPLACEMENT TOTAL KNEE Bilateral 1998, 2001       TONSILLECTOMY AND ADENOIDECTOMY     Social History:  reports that she has never smoked. She has never used smokeless tobacco. She reports that she does not drink alcohol and does not use drugs.  Allergies  Allergen Reactions   Cephalosporins     Other reaction(s): Unknown   Codeine     Other reaction(s): nausea   Penicillins     Mother and brother have allergy, patient just does not take this Did PCN reaction causing immediate rash, facial/tongue/throat swelling, SOB or  lightheadedness with hypotension: unknown Did PCN reaction causing severe rash involving mucus membranes or skin necrosis:unknown Did PCN reaction that required hospitalization : unknown Did PCN reaction occurring within the last 10 years: unknown Pt never actually took med. Did okay with ampicillin   Penicillin G     Pt states not allergic just family history of allergies to PCN    Family History  Problem Relation Age of Onset   Hypertension Father    Heart disease Father    Hypertension Mother    Hyperlipidemia Mother    Heart attack Mother    Prostate cancer Brother     Prior to Admission medications   Medication Sig Start Date End Date Taking? Authorizing Provider  apixaban (ELIQUIS) 2.5 MG TABS tablet Take 1 tablet by mouth 2 (two) times daily.   Yes [provider]  digoxin (LANOXIN) 0.125 MG tablet Take 0.25 mg by mouth daily. Take 2 tablets (0.25 mg) daily 09/08/21  Yes [provider]  furosemide (LASIX) 20 MG tablet Take 20 mg by mouth daily. 02/04/21  Yes [provider]  ketoconazole (NIZORAL) 2 % cream Apply 1 application topically daily. 09/18/21  Yes [provider]  levothyroxine (SYNTHROID) 125 MCG tablet Take 1 tablet by mouth daily.  Yes [provider]  metoprolol succinate (TOPROL-XL) 50 MG 24 hr tablet Take 50 mg by mouth daily. 09/05/21  Yes [provider]  potassium chloride (MICRO-K) 10 MEQ CR capsule Take 10 mEq by mouth daily. 09/24/21  Yes [provider]  Prenatal Vit-Fe Fumarate-FA (PRENATAL VITAMIN PLUS LOW IRON) 27-1 MG TABS Take 1 tablet by mouth daily. 02/04/21  Yes [provider]    Physical Exam: Vitals:   09/26/21 2130 09/26/21 2200 09/26/21 2230 09/26/21 2343  BP: (!) 194/122 (!) 193/140 (!) 151/93 133/62  Pulse:  (!) 118 (!) 114 (!) 114  Resp:   18   Temp:      TempSrc:      SpO2:  100% 95% 96%  Weight:      Height:       Constitutional: NAD, calm, comfortable Eyes:  PERRL, lids and conjunctivae normal ENMT: Mucous membranes are moist. Posterior pharynx clear of any exudate or lesions.Normal dentition.  Neck: normal, supple, no masses, no thyromegaly Respiratory: clear to auscultation bilaterally, no wheezing, no crackles. Normal respiratory effort. No accessory muscle use.  Cardiovascular: Regular rate and rhythm, no murmurs / rubs / gallops. No extremity edema. 2+ pedal pulses. No carotid bruits.  Abdomen: no tenderness, no masses palpated. No hepatosplenomegaly. Bowel sounds positive.  Musculoskeletal: no clubbing / cyanosis. No joint deformity upper and lower extremities. Good ROM, no contractures. Normal muscle tone.  Skin: no rashes, lesions, ulcers. No induration Neurologic: CN 2-12 grossly intact. Sensation intact, DTR normal. Strength 5/5 in all 4.  Psychiatric: Normal judgment and insight. Alert and oriented x 3. Normal mood.    Data Reviewed:  Pt with WBC 17k, UA suggestive of UTI, AKI with creat 2.x up from 0.x, CT AP = 63mm ureteral stone with hydroureter and hydronephrosis.  Assessment and Plan: * Pyohydronephrosis- (present on admission) Pt going to OR to get ureteral stent for the 28mm obstructing stone. Ordering zosyn for ABx: Allergies list reviewed: pt has a FAMILY history of allergy to PCN (in mother and brother) she personally has never had a reaction to PCN, and apparently has tolerated ampicillin in past according to the notes in the allergies list.  Patient confirms this to me when I spoke to her.  As a result would rather use a beta-lactam like zosyn in this patient since no contraindications rather than cipro in this patient with AKI and advanced age. Repeat labs in AM.   AKI (acute kidney injury) ([place])- (present on admission) AKI apparently post-renal from the obstructing ureteral stone. Not sure why it seems like other kidney isnt working so well? Regardless, pt off to get ureteral stent for pyohydronephrosis tonight. Strict  intake and output. IVF: LR at 100 Hold all nephrotoxic meds Repeat BMP in AM  Paroxysmal atrial fibrillation ([place])- (present on admission) Resume eliquis later this AM assuming no major bleeding post-op from ureteral stent today. Cont BB Cont digoxin  Hypertension- (present on admission) Cont BB Holding lasix due to AKI.  Sleep apnea- (present on admission) Cont CPAP QHS       Advance Care Planning:   Code Status: Full Code  Consults: Dr. Junious Silk  Family Communication: No family in room  Severity of Illness: The appropriate patient status for this patient is INPATIENT. Inpatient status is judged to be reasonable and necessary in order to provide the required intensity of service to ensure the patient's safety. The patient's presenting symptoms, physical exam findings, and initial radiographic and laboratory data in the context  of their chronic comorbidities is felt to place them at high risk for further clinical deterioration. Furthermore, it is not anticipated that the patient will be medically stable for discharge from the hospital within 2 midnights of admission.   * I certify that at the point of admission it is my clinical judgment that the patient will require inpatient hospital care spanning beyond 2 midnights from the point of admission due to high intensity of service, high risk for further deterioration and high frequency of surveillance required.*  Author: Etta Quill., DO 09/26/2021 11:55 PM  For on call review [email or web address].

## 2021-09-26 NOTE — Assessment & Plan Note (Addendum)
Status post cystoscopy and right ureteric stent placement by urology.  Patient has been able to void without Foley catheter at this time.  Received IV Zosyn during hospitalization.  Urine culture showing E. coli 40,000 colony.  Patient will be discharged on Augmentin on discharge.Patient may need staged stone removal by urology, will schedule follow-up.

## 2021-09-26 NOTE — Consult Note (Addendum)
Consultation: Right proximal stone, UTI, leukocytosis and acute kidney injury Requested by: Dr. Lacretia Leigh  H&P   History of Present Illness: Katrina [name] has a history of stones.  She presented to emergency today with vomiting and malaise.  Her white count was 17 and creatinine bumped to 2.23 up from 0.8.  Temp 99 and heart rate 114.  CT scan abdomen and pelvis obtained showed an 8 mm right proximal stone and moderate right hydro with some extravasation.  Her urinalysis showed many bacteria greater than 50 white cells and moderate leukocytes. Given concerns for pending sepsis she was brought for urgent stent.  She typically follows with Dr. Jeffie Pollock for a history of stones, incontinence and complex renal cyst.  Past Medical History:  Diagnosis Date   Atrial fibrillation ([place])    DVT (deep venous thrombosis) (HCC)    Elevated lipids    Gallstone    Hypertension    Hypothyroidism    Nephrolithiasis    Overactive bladder    Pulmonary emboli (HCC)    while on HRT   SCC (squamous cell carcinoma)    of left leg   Sleep apnea    on C-pap   Vertigo    Past Surgical History:  Procedure Laterality Date   CHOLECYSTECTOMY     Dr. Zenia Resides   CYSTOSCOPY WITH STENT PLACEMENT  9/12   nephrolithaisis   HYSTEROSCOPY WITH D & C  7/09   PMP with endometrial polyp   REPLACEMENT TOTAL KNEE Bilateral 1998, 2001       TONSILLECTOMY AND ADENOIDECTOMY      Home Medications:  (Not in a hospital admission)  Allergies:  Allergies  Allergen Reactions   Cephalosporins     Other reaction(s): Unknown   Codeine     Other reaction(s): nausea   Penicillins     Mother and brother have allergy, patient just does not take this Did PCN reaction causing immediate rash, facial/tongue/throat swelling, SOB or lightheadedness with hypotension: unknown Did PCN reaction causing severe rash involving mucus membranes or skin necrosis:unknown Did PCN reaction that required hospitalization : unknown Did PCN reaction  occurring within the last 10 years: unknown Pt never actually took med. Did okay with ampicillin   Penicillin G     Pt states not allergic just family history of allergies to PCN    Family History  Problem Relation Age of Onset   Hypertension Father    Heart disease Father    Hypertension Mother    Hyperlipidemia Mother    Heart attack Mother    Prostate cancer Brother    Social History:  reports that she has never smoked. She has never used smokeless tobacco. She reports that she does not drink alcohol and does not use drugs.  ROS: A complete review of systems was performed.  All systems are negative except for pertinent findings as noted. Review of Systems  All other systems reviewed and are negative.   Physical Exam:  Vital signs in last 24 hours: Temp:  [99.2 F (37.3 C)] 99.2 F (37.3 C) (02/04 1950) Pulse Rate:  [98-118] 114 (02/04 2343) Resp:  [18-20] 18 (02/04 2230) BP: (133-194)/(62-141) 133/62 (02/04 2343) SpO2:  [95 %-100 %] 96 % (02/04 2343) Weight:  [90.7 kg] 90.7 kg (02/04 1948) General:  Alert and oriented, No acute distress HEENT: Normocephalic, atraumatic Cardiovascular: Regular rate and rhythm Lungs: Regular rate and effort Abdomen: Soft, nontender, nondistended, no abdominal masses Extremities: No edema Neurologic: Grossly intact  Laboratory Data:  Results  for orders placed or performed during the hospital encounter of 09/26/21 (from the past 24 hour(s))  Lipase, blood     Status: None   Collection Time: 09/26/21  8:36 PM  Result Value Ref Range   Lipase 19 11 - 51 U/L  Comprehensive metabolic panel     Status: Abnormal   Collection Time: 09/26/21  8:36 PM  Result Value Ref Range   Sodium 133 (L) 135 - 145 mmol/L   Potassium 4.3 3.5 - 5.1 mmol/L   Chloride 100 98 - 111 mmol/L   CO2 22 22 - 32 mmol/L   Glucose, Bld 214 (H) 70 - 99 mg/dL   BUN 33 (H) 8 - 23 mg/dL   Creatinine, Ser 2.23 (H) 0.44 - 1.00 mg/dL   Calcium 8.7 (L) 8.9 - 10.3 mg/dL    Total Protein 8.6 (H) 6.5 - 8.1 g/dL   Albumin 3.5 3.5 - 5.0 g/dL   AST 43 (H) 15 - 41 U/L   ALT 17 0 - 44 U/L   Alkaline Phosphatase 100 38 - 126 U/L   Total Bilirubin 1.4 (H) 0.3 - 1.2 mg/dL   GFR, Estimated 21 (L) >60 mL/min   Anion gap 11 5 - 15  CBC     Status: Abnormal   Collection Time: 09/26/21  8:36 PM  Result Value Ref Range   WBC 17.4 (H) 4.0 - 10.5 K/uL   RBC 4.49 3.87 - 5.11 MIL/uL   Hemoglobin 12.9 12.0 - 15.0 g/dL   HCT 40.3 36.0 - 46.0 %   MCV 89.8 80.0 - 100.0 fL   MCH 28.7 26.0 - 34.0 pg   MCHC 32.0 30.0 - 36.0 g/dL   RDW 17.9 (H) 11.5 - 15.5 %   Platelets 134 (L) 150 - 400 K/uL   nRBC 0.0 0.0 - 0.2 %  Resp Panel by RT-PCR (Flu A&B, Covid) Nasopharyngeal Swab     Status: None   Collection Time: 09/26/21  9:29 PM   Specimen: Nasopharyngeal Swab; Nasopharyngeal(NP) swabs in vial transport medium  Result Value Ref Range   SARS Coronavirus 2 by RT PCR NEGATIVE NEGATIVE   Influenza A by PCR NEGATIVE NEGATIVE   Influenza B by PCR NEGATIVE NEGATIVE  Digoxin level     Status: None   Collection Time: 09/26/21  9:29 PM  Result Value Ref Range   Digoxin Level 1.7 0.8 - 2.0 ng/mL  Urinalysis, Routine w reflex microscopic Urine, In & Out Cath     Status: Abnormal   Collection Time: 09/26/21  9:54 PM  Result Value Ref Range   Color, Urine YELLOW (A) YELLOW   APPearance CLEAR (A) CLEAR   Specific Gravity, Urine 1.025 1.005 - 1.030   pH 6.0 5.0 - 8.0   Glucose, UA NEGATIVE NEGATIVE mg/dL   Hgb urine dipstick LARGE (A) NEGATIVE   Bilirubin Urine NEGATIVE NEGATIVE   Ketones, ur NEGATIVE NEGATIVE mg/dL   Protein, ur 100 (A) NEGATIVE mg/dL   Nitrite NEGATIVE NEGATIVE   Leukocytes,Ua MODERATE (A) NEGATIVE   RBC / HPF 11-20 0 - 5 RBC/hpf   WBC, UA >50 (H) 0 - 5 WBC/hpf   Bacteria, UA MANY (A) NONE SEEN   Squamous Epithelial / LPF 21-50 0 - 5   WBC Clumps PRESENT    Mucus PRESENT    Recent Results (from the past 240 hour(s))  Resp Panel by RT-PCR (Flu A&B, Covid)  Nasopharyngeal Swab     Status: None   Collection Time: 09/26/21  9:29  PM   Specimen: Nasopharyngeal Swab; Nasopharyngeal(NP) swabs in vial transport medium  Result Value Ref Range Status   SARS Coronavirus 2 by RT PCR NEGATIVE NEGATIVE Final    Comment: (NOTE) SARS-CoV-2 target nucleic acids are NOT DETECTED.  The SARS-CoV-2 RNA is generally detectable in upper respiratory specimens during the acute phase of infection. The lowest concentration of SARS-CoV-2 viral copies this assay can detect is 138 copies/mL. A negative result does not preclude SARS-Cov-2 infection and should not be used as the sole basis for treatment or other patient management decisions. A negative result may occur with  improper specimen collection/handling, submission of specimen other than nasopharyngeal swab, presence of viral mutation(s) within the areas targeted by this assay, and inadequate number of viral copies(<138 copies/mL). A negative result must be combined with clinical observations, patient history, and epidemiological information. The expected result is Negative.  Fact Sheet for Patients:  EntrepreneurPulse.com.au  Fact Sheet for Healthcare Providers:  IncredibleEmployment.be  This test is no t yet approved or cleared by the [place] FDA and  has been authorized for detection and/or diagnosis of SARS-CoV-2 by FDA under an Emergency Use Authorization (EUA). This EUA will remain  in effect (meaning this test can be used) for the duration of the COVID-19 declaration under Section 564(b)(1) of the Act, 21 U.S.C.section 360bbb-3(b)(1), unless the authorization is terminated  or revoked sooner.       Influenza A by PCR NEGATIVE NEGATIVE Final   Influenza B by PCR NEGATIVE NEGATIVE Final    Comment: (NOTE) The Xpert Xpress SARS-CoV-2/FLU/RSV plus assay is intended as an aid in the diagnosis of influenza from Nasopharyngeal swab specimens and should not be  used as a sole basis for treatment. Nasal washings and aspirates are unacceptable for Xpert Xpress SARS-CoV-2/FLU/RSV testing.  Fact Sheet for Patients: EntrepreneurPulse.com.au  Fact Sheet for Healthcare Providers: IncredibleEmployment.be  This test is not yet approved or cleared by the [place] FDA and has been authorized for detection and/or diagnosis of SARS-CoV-2 by FDA under an Emergency Use Authorization (EUA). This EUA will remain in effect (meaning this test can be used) for the duration of the COVID-19 declaration under Section 564(b)(1) of the Act, 21 U.S.C. section 360bbb-3(b)(1), unless the authorization is terminated or revoked.  Performed at [facility], [place] [address], [place], [place] [address]    Creatinine: Recent Labs    09/26/21 2036  CREATININE 2.23*    Impression/Assessment:   Leukocytosis. AKI, UTI, right 8 mm UPJ stone and hydro -    Plan:   I discussed with the patient the nature, potential benefits, risks and alternatives to cystoscopy with right retrograde pyelogram right ureteral stent placement, including side effects of the proposed treatment, the likelihood of the patient achieving the goals of the procedure, and any potential problems that might occur during the procedure or recuperation.  We discussed the rationale for a staged procedure/prestent.  All questions answered. Patient elects to proceed.   Festus Aloe 09/26/2021, 11:49 PM

## 2021-09-26 NOTE — Progress Notes (Addendum)
Pharmacy Antibiotic Note  Katrina [name] is a 88 y.o. female admitted on 09/26/2021 with  pyohydronephrosis .  Pharmacy has been consulted for Zosyn dosing.  Admit with AKI (baseline Scr 0.8-0.9). UTI + hydroureteronephrosis on right secondary to kidney stone.  Plan for emergent stent.    Penicillin allergy noted in chart- she has tolerated amoxicillin and never personally had PCN reaction however has multiple 1st degree relatives with PCN allergy.   Plan: Zosyn 2.5gm IV q6h infused over 30 min for CrCl <19ml/min  Monitor renal function and cx data   Height: 5\' 1"  (154.9 cm) Weight: 90.7 kg (200 lb) IBW/kg (Calculated) : 47.8  Temp (24hrs), Avg:99.2 F (37.3 C), Min:99.2 F (37.3 C), Max:99.2 F (37.3 C)  Recent Labs  Lab 09/26/21 2036  WBC 17.4*  CREATININE 2.23*    Estimated Creatinine Clearance: 18.2 mL/min (A) (by C-G formula based on SCr of 2.23 mg/dL (H)).    Allergies  Allergen Reactions   Cephalosporins     Other reaction(s): Unknown   Codeine     Other reaction(s): nausea   Penicillins     Mother and brother have allergy, patient just does not take this Did PCN reaction causing immediate rash, facial/tongue/throat swelling, SOB or lightheadedness with hypotension: unknown Did PCN reaction causing severe rash involving mucus membranes or skin necrosis:unknown Did PCN reaction that required hospitalization : unknown Did PCN reaction occurring within the last 10 years: unknown Pt never actually took med. Did okay with ampicillin   Penicillin G     Pt states not allergic just family history of allergies to PCN    Antimicrobials this admission: 2/4 Zosyn>>  Dose adjustments this admission:  Microbiology results:  Thank you for allowing pharmacy to be a part of this patients care.  Netta Cedars PharmD 09/26/2021 11:36 PM

## 2021-09-26 NOTE — H&P (View-Only) (Signed)
Consultation: Right proximal stone, UTI, leukocytosis and acute kidney injury Requested by: Dr. Lacretia Leigh  H&P   History of Present Illness: Katrina [name] has a history of stones.  She presented to emergency today with vomiting and malaise.  Her white count was 17 and creatinine bumped to 2.23 up from 0.8.  Temp 99 and heart rate 114.  CT scan abdomen and pelvis obtained showed an 8 mm right proximal stone and moderate right hydro with some extravasation.  Her urinalysis showed many bacteria greater than 50 white cells and moderate leukocytes. Given concerns for pending sepsis she was brought for urgent stent.  She typically follows with Dr. Jeffie Pollock for a history of stones, incontinence and complex renal cyst.  Past Medical History:  Diagnosis Date   Atrial fibrillation ([place])    DVT (deep venous thrombosis) (HCC)    Elevated lipids    Gallstone    Hypertension    Hypothyroidism    Nephrolithiasis    Overactive bladder    Pulmonary emboli (HCC)    while on HRT   SCC (squamous cell carcinoma)    of left leg   Sleep apnea    on C-pap   Vertigo    Past Surgical History:  Procedure Laterality Date   CHOLECYSTECTOMY     Dr. Zenia Resides   CYSTOSCOPY WITH STENT PLACEMENT  9/12   nephrolithaisis   HYSTEROSCOPY WITH D & C  7/09   PMP with endometrial polyp   REPLACEMENT TOTAL KNEE Bilateral 1998, 2001       TONSILLECTOMY AND ADENOIDECTOMY      Home Medications:  (Not in a hospital admission)  Allergies:  Allergies  Allergen Reactions   Cephalosporins     Other reaction(s): Unknown   Codeine     Other reaction(s): nausea   Penicillins     Mother and brother have allergy, patient just does not take this Did PCN reaction causing immediate rash, facial/tongue/throat swelling, SOB or lightheadedness with hypotension: unknown Did PCN reaction causing severe rash involving mucus membranes or skin necrosis:unknown Did PCN reaction that required hospitalization : unknown Did PCN reaction  occurring within the last 10 years: unknown Pt never actually took med. Did okay with ampicillin   Penicillin G     Pt states not allergic just family history of allergies to PCN    Family History  Problem Relation Age of Onset   Hypertension Father    Heart disease Father    Hypertension Mother    Hyperlipidemia Mother    Heart attack Mother    Prostate cancer Brother    Social History:  reports that she has never smoked. She has never used smokeless tobacco. She reports that she does not drink alcohol and does not use drugs.  ROS: A complete review of systems was performed.  All systems are negative except for pertinent findings as noted. Review of Systems  All other systems reviewed and are negative.   Physical Exam:  Vital signs in last 24 hours: Temp:  [99.2 F (37.3 C)] 99.2 F (37.3 C) (02/04 1950) Pulse Rate:  [98-118] 114 (02/04 2343) Resp:  [18-20] 18 (02/04 2230) BP: (133-194)/(62-141) 133/62 (02/04 2343) SpO2:  [95 %-100 %] 96 % (02/04 2343) Weight:  [90.7 kg] 90.7 kg (02/04 1948) General:  Alert and oriented, No acute distress HEENT: Normocephalic, atraumatic Cardiovascular: Regular rate and rhythm Lungs: Regular rate and effort Abdomen: Soft, nontender, nondistended, no abdominal masses Extremities: No edema Neurologic: Grossly intact  Laboratory Data:  Results  for orders placed or performed during the hospital encounter of 09/26/21 (from the past 24 hour(s))  Lipase, blood     Status: None   Collection Time: 09/26/21  8:36 PM  Result Value Ref Range   Lipase 19 11 - 51 U/L  Comprehensive metabolic panel     Status: Abnormal   Collection Time: 09/26/21  8:36 PM  Result Value Ref Range   Sodium 133 (L) 135 - 145 mmol/L   Potassium 4.3 3.5 - 5.1 mmol/L   Chloride 100 98 - 111 mmol/L   CO2 22 22 - 32 mmol/L   Glucose, Bld 214 (H) 70 - 99 mg/dL   BUN 33 (H) 8 - 23 mg/dL   Creatinine, Ser 2.23 (H) 0.44 - 1.00 mg/dL   Calcium 8.7 (L) 8.9 - 10.3 mg/dL    Total Protein 8.6 (H) 6.5 - 8.1 g/dL   Albumin 3.5 3.5 - 5.0 g/dL   AST 43 (H) 15 - 41 U/L   ALT 17 0 - 44 U/L   Alkaline Phosphatase 100 38 - 126 U/L   Total Bilirubin 1.4 (H) 0.3 - 1.2 mg/dL   GFR, Estimated 21 (L) >60 mL/min   Anion gap 11 5 - 15  CBC     Status: Abnormal   Collection Time: 09/26/21  8:36 PM  Result Value Ref Range   WBC 17.4 (H) 4.0 - 10.5 K/uL   RBC 4.49 3.87 - 5.11 MIL/uL   Hemoglobin 12.9 12.0 - 15.0 g/dL   HCT 40.3 36.0 - 46.0 %   MCV 89.8 80.0 - 100.0 fL   MCH 28.7 26.0 - 34.0 pg   MCHC 32.0 30.0 - 36.0 g/dL   RDW 17.9 (H) 11.5 - 15.5 %   Platelets 134 (L) 150 - 400 K/uL   nRBC 0.0 0.0 - 0.2 %  Resp Panel by RT-PCR (Flu A&B, Covid) Nasopharyngeal Swab     Status: None   Collection Time: 09/26/21  9:29 PM   Specimen: Nasopharyngeal Swab; Nasopharyngeal(NP) swabs in vial transport medium  Result Value Ref Range   SARS Coronavirus 2 by RT PCR NEGATIVE NEGATIVE   Influenza A by PCR NEGATIVE NEGATIVE   Influenza B by PCR NEGATIVE NEGATIVE  Digoxin level     Status: None   Collection Time: 09/26/21  9:29 PM  Result Value Ref Range   Digoxin Level 1.7 0.8 - 2.0 ng/mL  Urinalysis, Routine w reflex microscopic Urine, In & Out Cath     Status: Abnormal   Collection Time: 09/26/21  9:54 PM  Result Value Ref Range   Color, Urine YELLOW (A) YELLOW   APPearance CLEAR (A) CLEAR   Specific Gravity, Urine 1.025 1.005 - 1.030   pH 6.0 5.0 - 8.0   Glucose, UA NEGATIVE NEGATIVE mg/dL   Hgb urine dipstick LARGE (A) NEGATIVE   Bilirubin Urine NEGATIVE NEGATIVE   Ketones, ur NEGATIVE NEGATIVE mg/dL   Protein, ur 100 (A) NEGATIVE mg/dL   Nitrite NEGATIVE NEGATIVE   Leukocytes,Ua MODERATE (A) NEGATIVE   RBC / HPF 11-20 0 - 5 RBC/hpf   WBC, UA >50 (H) 0 - 5 WBC/hpf   Bacteria, UA MANY (A) NONE SEEN   Squamous Epithelial / LPF 21-50 0 - 5   WBC Clumps PRESENT    Mucus PRESENT    Recent Results (from the past 240 hour(s))  Resp Panel by RT-PCR (Flu A&B, Covid)  Nasopharyngeal Swab     Status: None   Collection Time: 09/26/21  9:29  PM   Specimen: Nasopharyngeal Swab; Nasopharyngeal(NP) swabs in vial transport medium  Result Value Ref Range Status   SARS Coronavirus 2 by RT PCR NEGATIVE NEGATIVE Final    Comment: (NOTE) SARS-CoV-2 target nucleic acids are NOT DETECTED.  The SARS-CoV-2 RNA is generally detectable in upper respiratory specimens during the acute phase of infection. The lowest concentration of SARS-CoV-2 viral copies this assay can detect is 138 copies/mL. A negative result does not preclude SARS-Cov-2 infection and should not be used as the sole basis for treatment or other patient management decisions. A negative result may occur with  improper specimen collection/handling, submission of specimen other than nasopharyngeal swab, presence of viral mutation(s) within the areas targeted by this assay, and inadequate number of viral copies(<138 copies/mL). A negative result must be combined with clinical observations, patient history, and epidemiological information. The expected result is Negative.  Fact Sheet for Patients:  EntrepreneurPulse.com.au  Fact Sheet for Healthcare Providers:  IncredibleEmployment.be  This test is no t yet approved or cleared by the [place] FDA and  has been authorized for detection and/or diagnosis of SARS-CoV-2 by FDA under an Emergency Use Authorization (EUA). This EUA will remain  in effect (meaning this test can be used) for the duration of the COVID-19 declaration under Section 564(b)(1) of the Act, 21 U.S.C.section 360bbb-3(b)(1), unless the authorization is terminated  or revoked sooner.       Influenza A by PCR NEGATIVE NEGATIVE Final   Influenza B by PCR NEGATIVE NEGATIVE Final    Comment: (NOTE) The Xpert Xpress SARS-CoV-2/FLU/RSV plus assay is intended as an aid in the diagnosis of influenza from Nasopharyngeal swab specimens and should not be  used as a sole basis for treatment. Nasal washings and aspirates are unacceptable for Xpert Xpress SARS-CoV-2/FLU/RSV testing.  Fact Sheet for Patients: EntrepreneurPulse.com.au  Fact Sheet for Healthcare Providers: IncredibleEmployment.be  This test is not yet approved or cleared by the [place] FDA and has been authorized for detection and/or diagnosis of SARS-CoV-2 by FDA under an Emergency Use Authorization (EUA). This EUA will remain in effect (meaning this test can be used) for the duration of the COVID-19 declaration under Section 564(b)(1) of the Act, 21 U.S.C. section 360bbb-3(b)(1), unless the authorization is terminated or revoked.  Performed at [facility], [place] [address], [place], [place] [address]    Creatinine: Recent Labs    09/26/21 2036  CREATININE 2.23*    Impression/Assessment:   Leukocytosis. AKI, UTI, right 8 mm UPJ stone and hydro -    Plan:   I discussed with the patient the nature, potential benefits, risks and alternatives to cystoscopy with right retrograde pyelogram right ureteral stent placement, including side effects of the proposed treatment, the likelihood of the patient achieving the goals of the procedure, and any potential problems that might occur during the procedure or recuperation.  We discussed the rationale for a staged procedure/prestent.  All questions answered. Patient elects to proceed.   Festus Aloe 09/26/2021, 11:49 PM

## 2021-09-26 NOTE — ED Provider Triage Note (Signed)
Emergency Medicine Provider Triage Evaluation Note  Katrina [name] , a 88 y.o. female  was evaluated in triage.  Pt complains of nausea and vomiting for approximately 2 to 3 days.  Patient denies recent illness.  She denies fever, chills, congestion.  She also denies abdominal pain and urinary symptoms.  No alleviating or aggravating factors.  No treatment prior to arrival.  Review of Systems  Positive: Nausea, vomiting Negative: Abdominal pain, urinary symptoms, fever  Physical Exam  BP (!) 156/106 (BP Location: Left Arm)    Pulse 98    Temp 99.2 F (37.3 C) (Oral)    Resp 20    Ht 5\' 1"  (1.549 m)    Wt 90.7 kg    LMP 01/22/2011    SpO2 97%    BMI 37.79 kg/m  Gen:   Awake, no distress   Resp:  Normal effort  MSK:   Moves extremities without difficulty  Other:  Abdomen soft, nondistended, nontender to palpation in all quadrants without guarding or peritoneal signs   Medical Decision Making  Medically screening exam initiated at 8:10 PM.  Appropriate orders placed.  Katrina [name] was informed that the remainder of the evaluation will be completed by another provider, this initial triage assessment does not replace that evaluation, and the importance of remaining in the ED until their evaluation is complete.     Tonye Pearson, [place] 09/26/21 2011

## 2021-09-27 ENCOUNTER — Inpatient Hospital Stay (HOSPITAL_COMMUNITY): Payer: Medicare Other | Admitting: Certified Registered Nurse Anesthetist

## 2021-09-27 ENCOUNTER — Inpatient Hospital Stay (HOSPITAL_COMMUNITY): Payer: Medicare Other

## 2021-09-27 ENCOUNTER — Encounter (HOSPITAL_COMMUNITY): Payer: Self-pay | Admitting: Internal Medicine

## 2021-09-27 LAB — BASIC METABOLIC PANEL
Anion gap: 10 (ref 5–15)
BUN: 35 mg/dL — ABNORMAL HIGH (ref 8–23)
CO2: 23 mmol/L (ref 22–32)
Calcium: 8.2 mg/dL — ABNORMAL LOW (ref 8.9–10.3)
Chloride: 101 mmol/L (ref 98–111)
Creatinine, Ser: 2 mg/dL — ABNORMAL HIGH (ref 0.44–1.00)
GFR, Estimated: 24 mL/min — ABNORMAL LOW (ref >60)
Glucose, Bld: 208 mg/dL — ABNORMAL HIGH (ref 70–99)
Potassium: 4 mmol/L (ref 3.5–5.1)
Sodium: 134 mmol/L — ABNORMAL LOW (ref 135–145)

## 2021-09-27 LAB — CBC
HCT: 34.1 % — ABNORMAL LOW (ref 36.0–46.0)
Hemoglobin: 10.9 g/dL — ABNORMAL LOW (ref 12.0–15.0)
MCH: 28.9 pg (ref 26.0–34.0)
MCHC: 32 g/dL (ref 30.0–36.0)
MCV: 90.5 fL (ref 80.0–100.0)
Platelets: 100 10*3/uL — ABNORMAL LOW (ref 150–400)
RBC: 3.77 MIL/uL — ABNORMAL LOW (ref 3.87–5.11)
RDW: 17.7 % — ABNORMAL HIGH (ref 11.5–15.5)
WBC: 11.2 10*3/uL — ABNORMAL HIGH (ref 4.0–10.5)
nRBC: 0 % (ref 0.0–0.2)

## 2021-09-27 MED ORDER — SODIUM CHLORIDE 0.9 % IR SOLN
Status: DC | PRN
  Administered 2021-09-27: 3000 mL

## 2021-09-27 MED ORDER — CHLORHEXIDINE GLUCONATE CLOTH 2 % EX PADS
6.0000 | MEDICATED_PAD | Freq: Every day | CUTANEOUS | Status: DC
  Administered 2021-09-27 – 2021-09-28 (×2): 6 via TOPICAL

## 2021-09-27 MED ORDER — ACETAMINOPHEN 10 MG/ML IV SOLN
INTRAVENOUS | Status: AC
  Filled 2021-09-27: qty 100

## 2021-09-27 MED ORDER — LIDOCAINE 2% (20 MG/ML) 5 ML SYRINGE
INTRAMUSCULAR | Status: DC | PRN
  Administered 2021-09-27: 60 mg via INTRAVENOUS

## 2021-09-27 MED ORDER — PROPOFOL 10 MG/ML IV BOLUS
INTRAVENOUS | Status: DC | PRN
  Administered 2021-09-27: 30 mg via INTRAVENOUS
  Administered 2021-09-27: 120 mg via INTRAVENOUS

## 2021-09-27 MED ORDER — IOHEXOL 300 MG/ML  SOLN
INTRAMUSCULAR | Status: DC | PRN
  Administered 2021-09-27: 10 mL

## 2021-09-27 MED ORDER — APIXABAN 2.5 MG PO TABS
2.5000 mg | ORAL_TABLET | Freq: Two times a day (BID) | ORAL | Status: DC
  Administered 2021-09-27 – 2021-09-30 (×7): 2.5 mg via ORAL
  Filled 2021-09-27 (×7): qty 1

## 2021-09-27 MED ORDER — METOPROLOL SUCCINATE ER 50 MG PO TB24
50.0000 mg | ORAL_TABLET | Freq: Every day | ORAL | Status: DC
  Administered 2021-09-27 – 2021-09-30 (×3): 50 mg via ORAL
  Filled 2021-09-27 (×4): qty 1

## 2021-09-27 MED ORDER — FENTANYL CITRATE PF 50 MCG/ML IJ SOSY: 25.0000 ug | PREFILLED_SYRINGE | INTRAMUSCULAR | Status: DC | PRN

## 2021-09-27 MED ORDER — LEVOTHYROXINE SODIUM 25 MCG PO TABS
125.0000 ug | ORAL_TABLET | Freq: Every day | ORAL | Status: DC
  Administered 2021-09-27 – 2021-09-30 (×4): 125 ug via ORAL
  Filled 2021-09-27 (×4): qty 1

## 2021-09-27 MED ORDER — ORAL CARE MOUTH RINSE
15.0000 mL | Freq: Two times a day (BID) | OROMUCOSAL | Status: DC
  Administered 2021-09-27 – 2021-09-29 (×6): 15 mL via OROMUCOSAL

## 2021-09-27 MED ORDER — ACETAMINOPHEN 10 MG/ML IV SOLN
INTRAVENOUS | Status: DC | PRN
  Administered 2021-09-27: 1000 mg via INTRAVENOUS

## 2021-09-27 MED ORDER — FENTANYL CITRATE (PF) 100 MCG/2ML IJ SOLN
INTRAMUSCULAR | Status: DC | PRN
  Administered 2021-09-27: 50 ug via INTRAVENOUS
  Administered 2021-09-27 (×2): 25 ug via INTRAVENOUS

## 2021-09-27 MED ORDER — PIPERACILLIN-TAZOBACTAM 3.375 G IVPB
INTRAVENOUS | Status: AC
  Filled 2021-09-27: qty 50

## 2021-09-27 MED ORDER — DIGOXIN 250 MCG PO TABS
0.2500 mg | ORAL_TABLET | Freq: Every day | ORAL | Status: DC
  Administered 2021-09-27 – 2021-09-30 (×3): 0.25 mg via ORAL
  Filled 2021-09-27 (×4): qty 1

## 2021-09-27 MED ORDER — PIPERACILLIN-TAZOBACTAM IN DEX 2-0.25 GM/50ML IV SOLN
2.2500 g | Freq: Four times a day (QID) | INTRAVENOUS | Status: DC
  Filled 2021-09-27 (×2): qty 50

## 2021-09-27 MED ORDER — ONDANSETRON HCL 4 MG/2ML IJ SOLN
4.0000 mg | Freq: Once | INTRAMUSCULAR | Status: AC | PRN
  Administered 2021-09-27: 4 mg via INTRAVENOUS

## 2021-09-27 MED ORDER — PIPERACILLIN-TAZOBACTAM 3.375 G IVPB
3.3750 g | Freq: Three times a day (TID) | INTRAVENOUS | Status: DC
  Administered 2021-09-27 – 2021-09-30 (×9): 3.375 g via INTRAVENOUS
  Filled 2021-09-27 (×11): qty 50

## 2021-09-27 MED ORDER — PRENATAL MULTIVITAMIN CH
1.0000 | ORAL_TABLET | Freq: Every day | ORAL | Status: DC
  Administered 2021-09-27 – 2021-09-30 (×4): 1 via ORAL
  Filled 2021-09-27 (×4): qty 1

## 2021-09-27 MED ORDER — PIPERACILLIN-TAZOBACTAM IN DEX 2-0.25 GM/50ML IV SOLN: 2.2500 g | Freq: Three times a day (TID) | INTRAVENOUS | Status: DC

## 2021-09-27 MED ORDER — PHENYLEPHRINE 40 MCG/ML (10ML) SYRINGE FOR IV PUSH (FOR BLOOD PRESSURE SUPPORT)
PREFILLED_SYRINGE | INTRAVENOUS | Status: DC | PRN
  Administered 2021-09-27: 80 ug via INTRAVENOUS
  Administered 2021-09-27: 120 ug via INTRAVENOUS
  Administered 2021-09-27: 80 ug via INTRAVENOUS

## 2021-09-27 MED ORDER — DEXAMETHASONE SODIUM PHOSPHATE 10 MG/ML IJ SOLN
INTRAMUSCULAR | Status: DC | PRN
  Administered 2021-09-27: 10 mg via INTRAVENOUS

## 2021-09-27 NOTE — Assessment & Plan Note (Addendum)
She does not wear CPAP.

## 2021-09-27 NOTE — Evaluation (Signed)
Physical Therapy Evaluation Patient Details Name: Katrina [name] MRN: [id number] DOB: 08/30/1931 Today's Date: 09/27/2021  History of Present Illness  88 y.o. female with medical history significant of PAF on eliquis, prior DVT and PE, HTN, prior renal stones. Pt presents to ED with c/o N/V.determined to have obstructive 54mm R UPJ stone with hydronephrosis, UTI on UA. s/p urgent right ureteral stent on 09/26/21 per urology  Clinical Impression  Pt admitted with above diagnosis.  Pt confused during PT session, difficulty following multi-step commands and requiring frequent redirection to task.  Pt amb hallway distance however will likely need SNF at [facility] on d/c--she is unable to tell me if she was in [facility] ALF or ILF. Repeatedly states "here" referring to hospital as [facility].   Pt also with malodorous wound /skin breakdown over sacral and glut region.    Pt currently with functional limitations due to the deficits listed below (see PT Problem List). Pt will benefit from skilled PT to increase their independence and safety with mobility to allow discharge to the venue listed below.          Recommendations for follow up therapy are one component of a multi-disciplinary discharge planning process, led by the attending physician.  Recommendations may be updated based on patient status, additional functional criteria and insurance authorization.  Follow Up Recommendations Skilled nursing-short term rehab (<3 hours/day) (at [facility])    Assistance Recommended at Discharge Frequent or constant Supervision/Assistance (d/t cognition)  Patient can return home with the following  A little help with walking and/or transfers;Help with stairs or ramp for entrance;Assistance with cooking/housework;Assist for transportation;Direct supervision/assist for financial management    Equipment Recommendations None recommended by PT  Recommendations for Other Services       Functional Status Assessment Patient has had a  recent decline in their functional status and demonstrates the ability to make significant improvements in function in a reasonable and predictable amount of time.     Precautions / Restrictions Precautions Precautions: Fall Restrictions Weight Bearing Restrictions: No      Mobility  Bed Mobility Overal bed mobility: Needs Assistance Bed Mobility: Supine to Sit     Supine to sit: Min assist, +2 for safety/equipment, +2 for physical assistance, Mod assist, HOB elevated     General bed mobility comments: assist to elevate trunk and bed pad used to complete scooting to EOB.  incr time needed, multi-modal cues for sequencing, self assist and attention to task    Transfers Overall transfer level: Needs assistance Equipment used: Rolling walker (2 wheels) Transfers: Sit to/from Stand Sit to Stand: Min assist           General transfer comment: cues for hand placement, control of descent. light min assist to rise and transition to RW    Ambulation/Gait Ambulation/Gait assistance: Min guard, Min assist Gait Distance (Feet): 160 Feet Assistive device: Rolling walker (2 wheels) Gait Pattern/deviations: Step-through pattern, Decreased stride length       General Gait Details: cues for RW position, posture. mostly min/guard to steady, intermittent assist to maneuver RW  Stairs            Wheelchair Mobility    Modified Rankin (Stroke Patients Only)       Balance Overall balance assessment: Needs assistance Sitting-balance support: No upper extremity supported, Feet supported Sitting balance-Leahy Scale: Fair     Standing balance support: Reliant on assistive device for balance, During functional activity, Bilateral upper extremity supported Standing balance-Leahy Scale: Fair  Pertinent Vitals/Pain Pain Assessment Pain Assessment: No/denies pain    Home Living Family/patient expects to be discharged to:: Other  (Comment)                   Additional Comments: pt resides at [facility]. she is not a reliable historian at this time and cannot tell me if she is in [place], ALF or SNF.    Prior Function Prior Level of Function : Patient poor historian/Family not available             Mobility Comments: unable to determine, pt reports that she has been "working ith PT" at [employer] [facility] [employer]. states she uses rollator or cane       Hand Dominance        Extremity/Trunk Assessment   Upper Extremity Assessment Upper Extremity Assessment: Overall WFL for tasks assessed    Lower Extremity Assessment Lower Extremity Assessment: Overall WFL for tasks assessed       Communication   Communication: No difficulties  Cognition Arousal/Alertness: Awake/alert Behavior During Therapy: WFL for tasks assessed/performed Overall Cognitive Status: Impaired/Different from baseline Area of Impairment: Orientation, Following commands, Attention, Memory, Problem solving                 Orientation Level: Disoriented to, Situation, Place Current Attention Level: Focused Memory: Decreased short-term memory Following Commands: Follows one step commands with increased time, Follows multi-step commands inconsistently     Problem Solving: Slow processing, Requires verbal cues, Difficulty sequencing General Comments: pt able to state she is at [facility], however repeatedly refers to "here" s if we are at [facility]. pt is tangential and requires frequent redirection to current task.        General Comments      Exercises     Assessment/Plan    PT Assessment Patient needs continued PT services  PT Problem List         PT Treatment Interventions DME instruction;Gait training;Functional mobility training;Therapeutic activities;Patient/family education;Therapeutic exercise;Balance training    PT Goals (Current goals can be found in the Care Plan section)  Acute Rehab PT Goals Patient Stated Goal: to walk more and go  home PT Goal Formulation: With patient Time For Goal Achievement: 10/11/21 Potential to Achieve Goals: Good    Frequency Min 2X/week     Co-evaluation               AM-PAC PT "6 Clicks" Mobility  Outcome Measure Help needed turning from your back to your side while in a flat bed without using bedrails?: A Lot Help needed moving from lying on your back to sitting on the side of a flat bed without using bedrails?: A Lot Help needed moving to and from a bed to a chair (including a wheelchair)?: A Little Help needed standing up from a chair using your arms (e.g., wheelchair or bedside chair)?: A Little Help needed to walk in hospital room?: A Little Help needed climbing 3-5 steps with a railing? : A Lot 6 Click Score: 15    End of Session Equipment Utilized During Treatment: Gait belt Activity Tolerance: Patient tolerated treatment well Patient left: in chair;with call bell/phone within reach (placed pillow in chair d/t notable skin breakdown gluteal region) Nurse Communication: Mobility status;Other (comment) (skin breakdown)      Time: 4098-1191 PT Time Calculation (min) (ACUTE ONLY): 18 min   Charges:   PT Evaluation $PT Eval Low Complexity: 1 Low          Other At[place], PT  Acute Rehab Dept [facility]) [phone] Pager [phone]  09/27/2021   [facility] 09/27/2021, 11:53 AM

## 2021-09-27 NOTE — Progress Notes (Signed)
When asked admission questions, patient was unable to answer all the questions appropriately. Admission completed to the best of my ability at present.

## 2021-09-27 NOTE — Assessment & Plan Note (Addendum)
Continue to hold Lasix.  Continue beta-blocker.  Continue to monitor.  Could resume by tomorrow if continues to improve.

## 2021-09-27 NOTE — Progress Notes (Signed)
°  Progress Note   Patient: Katrina [name] OBS:[id number] DOB: 04-22-1932 DOA: 09/26/2021     1 DOS: the patient was seen and examined on 09/27/2021   Brief hospital course: Katrina [name] is a 88 y.o. female with medical history significant of PAF on eliquis, prior DVT and PE, HTN, prior renal stones. Pt presents to ED with c/o N/V.  Denies subjective fever or urinary symptoms.  Denies URI symptoms, SOB.  In ED:  Work up in ED is worrisome for obstructive 49mm R UPJ stone with hydronephrosis, UTI on UA.  WBC 17k, Tachycardia, Tm 99.2.  AKI with creat 2.2 up from 0.8 baseline last Aug.  Patient admitted to hospital with IV fluids and antibiotics and taken to operating room by urology.   [facility]  Walks with a walker. Assessment and Plan: * Pyohydronephrosis- (present on admission) Status post cystoscopy and right ureteric stent placed. Foley catheter in place.  Continue. Clinically improving. Remains on Zosyn, renally dosed.  Continue until blood cultures and urine cultures available. Patient may need staged stone removal, urology will schedule follow-up.   Paroxysmal atrial fibrillation ([place])- (present on admission) No apparent bleeding after procedure.  Resume Eliquis.  Cont BB Cont digoxin  AKI (acute kidney injury) ([place])- (present on admission) AKI apparently post-renal from the obstructing ureteral stone.  Some clinical improvement today.  Urine output is adequate.  Continue IV fluids today.  Recheck tomorrow morning. intake and output. IVF: LR at 100 Hold all nephrotoxic meds Repeat BMP in AM  Hypertension- (present on admission) Cont BB.  Stable today. Holding lasix due to AKI.  Sleep apnea- (present on admission) She does not wear CPAP.        Subjective: Patient seen and examined.  Denies any complaints.  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  Denies any abdominal pain nausea  vomiting.  Physical Exam: Vitals:   09/27/21 0120 09/27/21 0130 09/27/21 0140 09/27/21 0441  BP: (!) 119/55 (!) 112/58 (!) 149/59 (!) 146/52  Pulse: 100 (!) 101 96 92  Resp: (!) 26 (!) 27 20 (!) 22  Temp: 98 F (36.7 C)  98.1 F (36.7 C) (!) 97.5 F (36.4 C)  TempSrc:   Oral   SpO2: 98% 94% 100% 95%  Weight:   94.4 kg   Height:   5\' 1"  (1.549 m)    General: Age-appropriate.  Looks comfortable.  Not in any distress. Cardiovascular: S1-S2 normal.  Regular rate rhythm. Respiratory: Bilateral clear.  No added sounds. Gastrointestinal: Soft.  Nontender.  No flank tenderness. Ext: No swelling or edema. Neuro: Alert and oriented x4. Musculoskeletal: No deformities. Skin: Intact. Psych: Normal mood. Catheters: Foley catheter with dark urine.   Data Reviewed:  Results including renal function test, CBC with differential reviewed, updated patient.  Family Communication: None.  Disposition: Status is: Inpatient Remains inpatient appropriate because: IV antibiotics, IV fluids,          Planned Discharge Destination:  Assisted living facility with PT OT     Time spent: 35 minutes  Author: Barb Merino, MD 09/27/2021 9:44 AM  For on call review [email or web address].

## 2021-09-27 NOTE — Progress Notes (Signed)
PHARMACY NOTE -  [facility] has been assisting with dosing of Zosyn for UTI. Status post stent placement and expect rapid improvement in SCr. Will adjust dose to 3.375 g IV q8 hr and further renal adjustments per institutional Pharmacy antibiotic protocol Recommend following UCx and completing 7d appropriate abx for pyelo with adequate source control achieved  Pharmacy will sign off, following peripherally for culture results or dose adjustments. Please reconsult if a change in clinical status warrants re-evaluation of dosage.  Reuel Boom, PharmD, BCPS [phone] 09/27/2021, 9:38 AM

## 2021-09-27 NOTE — Progress Notes (Signed)
Necklace in the specimen bottle handed to [place].

## 2021-09-27 NOTE — Plan of Care (Signed)
Discussed with patient plan of care for the evening, pain management and IV fluids with some teach back displayed  Problem: Education: Goal: Knowledge of General Education information will improve Description: Including pain rating scale, medication(s)/side effects and non-pharmacologic comfort measures Outcome: Progressing   Problem: Health Behavior/Discharge Planning: Goal: Ability to manage health-related needs will improve Outcome: Progressing

## 2021-09-27 NOTE — Assessment & Plan Note (Addendum)
Continue digoxin, beta-blocker and Eliquis.

## 2021-09-27 NOTE — Progress Notes (Signed)
Pt without complaint. Feeling better. Seen and examined earlier on rounds today.   Urine clear in foley  A/P:  8 mm right prox stone, UTI s/p urgent stent earlier AM 09/27/2021:  -I will notify Dr. Jeffie Pollock of admission, send message for office f/u, etc.  -will d/c foley in AM  -pt will need outpatient ureteroscopy or ESWL in a few weeks to remove stone  OK to discharge from GU pt of view tomorrow or when pt stable, culture data know etc. Appreciate excellent TRH care.

## 2021-09-27 NOTE — Op Note (Signed)
Preoperative diagnosis: 8 mm right proximal stone, UTI, leukocytosis and acute kidney injury, emesis Postoperative diagnosis: Same  Procedure: Cystoscopy with right retrograde pyelogram and right ureteral stent placement  Surgeon: Junious Silk  Anesthesia: General  Indication for procedure: Katrina [name] is an 88 year old female with above was taken for an urgent right ureteral stent.  Findings: Moderate to severe vulvar atrophy but no specific vulvar vaginal lesions.  Meatus appeared normal.  On cystoscopy the urethra and bladder were unremarkable.  No stone or foreign body in the bladder.  Right retrograde pyelogram-the ureter appeared normal until the proximal ureter where there was a filling defect consistent with the stone and mild to moderate dilation of the collecting system.  After the contrast was injected and wire passed a good amount of debris laden thick white urine was extruded from the right ureteral orifice.  Urine culture was sent.  Description of procedure: After consent was obtained patient brought to the operating room.  After adequate anesthesia she was placed in lithotomy position and prepped and draped in the usual sterile fashion.  Timeout was performed to Santiago Glad the patient procedure.  Cystoscope was passed per urethra and the bladder inspected.  The right ureteral orifice was cannulated with an open-ended catheter and retrograde injection of contrast was performed.  A sensor wire was advanced and coiled in the upper calyx.  Urine was sent for culture.  Over the wire a 6 x 24 cm stent was advanced.  The wire was removed and a good coil was noted in the upper calyx and a good coil in the bladder.  Stent was draining well.  A 16 French Foley catheter was placed.  Balloon inflated seated at the bladder neck.  She was awakened taken recovery room in stable condition.  Complications: None  Blood loss: Minimal  Specimens to lab: Urine culture  Drains: 6 x 24 cm right ureteral stent, 16  French Foley catheter  Disposition: Patient stable to PACU

## 2021-09-27 NOTE — Discharge Instructions (Signed)
Be sure to follow-up with Dr. Jeffie Pollock to plan stent/stone removal

## 2021-09-27 NOTE — Evaluation (Signed)
Occupational Therapy Evaluation Patient Details Name: Katrina [name] MRN: [id number] DOB: 1931/09/16 Today's Date: 09/27/2021   History of Present Illness 88 y.o. female with medical history significant of PAF on eliquis, prior DVT and PE, HTN, prior renal stones. Pt presents to ED with c/o N/V.determined to have obstructive 52mm R UPJ stone with hydronephrosis, UTI on UA. s/p urgent right ureteral stent on 09/26/21 per urology   Clinical Impression   Patient is a 88 year old female who was admitted for above. Patient was living at ALF prior level with reports of having therapy at ALF. Currently, patient is max A for LB dressing tasks, min guard with consistent cues for safety with transfers from edge fo bed to commode with noted picking up walker with attempts at mobility. Patient was noted to have decreased functional activity tolerance, decreased endurance, decreased safety awareness impacting participation in ADLs. Patient would continue to benefit from skilled OT services at this time while admitted and after d/c to address noted deficits in order to improve overall safety and independence in ADLs.       Recommendations for follow up therapy are one component of a multi-disciplinary discharge planning process, led by the attending physician.  Recommendations may be updated based on patient status, additional functional criteria and insurance authorization.   Follow Up Recommendations  Skilled nursing-short term rehab (<3 hours/day)    Assistance Recommended at Discharge Frequent or constant Supervision/Assistance  Patient can return home with the following A little help with walking and/or transfers;A little help with bathing/dressing/bathroom;Direct supervision/assist for financial management;Direct supervision/assist for medications management;Help with stairs or ramp for entrance;Assist for transportation    Functional Status Assessment  Patient has had a recent decline in their functional  status and demonstrates the ability to make significant improvements in function in a reasonable and predictable amount of time.  Equipment Recommendations  None recommended by OT    Recommendations for Other Services       Precautions / Restrictions Precautions Precautions: Fall Restrictions Weight Bearing Restrictions: No      Mobility Bed Mobility Overal bed mobility: Needs Assistance Bed Mobility: Supine to Sit     Supine to sit: Min assist     General bed mobility comments: patient was able to sit on edge of bed with min A with use of grab bar and cues for proper hand and foot placement.    Transfers                          Balance Overall balance assessment: Needs assistance Sitting-balance support: No upper extremity supported, Feet supported Sitting balance-Leahy Scale: Fair     Standing balance support: Reliant on assistive device for balance, During functional activity Standing balance-Leahy Scale: Fair                             ADL either performed or assessed with clinical judgement   ADL Overall ADL's : Needs assistance/impaired Eating/Feeding: Set up;Sitting   Grooming: Wash/dry hands;Wash/dry face;Set up;Sitting;Brushing hair;Minimal assistance Grooming Details (indicate cue type and reason): EOB. min A to complete hair combing tasks. Upper Body Bathing: Set up;Sitting Upper Body Bathing Details (indicate cue type and reason): EOB Lower Body Bathing: Moderate assistance;Sit to/from stand;Sitting/lateral leans   Upper Body Dressing : Minimal assistance;Sitting   Lower Body Dressing: Sit to/from stand;Sitting/lateral leans;Maximal assistance   Toilet Transfer: Min guard;Rolling walker (2 wheels) Toilet Transfer Details (  indicate cue type and reason): with education to keep RW on the floor. patient was noted to pick up walker while taking steps and especially when turning. patient at end of transfer was noted to take quicker  steps with cues to slow down. Toileting- Clothing Manipulation and Hygiene: Sit to/from stand;Sitting/lateral lean;Moderate assistance               Vision Baseline Vision/History: 1 Wears glasses Patient Visual Report: No change from baseline       Perception     Praxis      Pertinent Vitals/Pain Pain Assessment Pain Assessment: No/denies pain     Hand Dominance Right   Extremity/Trunk Assessment Upper Extremity Assessment Upper Extremity Assessment: Overall WFL for tasks assessed   Lower Extremity Assessment Lower Extremity Assessment: Defer to PT evaluation   Cervical / Trunk Assessment Cervical / Trunk Assessment: Normal   Communication Communication Communication: No difficulties   Cognition Arousal/Alertness: Awake/alert Behavior During Therapy: WFL for tasks assessed/performed Overall Cognitive Status: Impaired/Different from baseline Area of Impairment: Orientation, Following commands, Attention, Memory, Problem solving                 Orientation Level: Disoriented to, Situation, Place Current Attention Level: Focused Memory: Decreased short-term memory Following Commands: Follows one step commands with increased time, Follows multi-step commands inconsistently     Problem Solving: Slow processing, Requires verbal cues, Difficulty sequencing General Comments: patient knows she is at the hosptial. patient states she used to work at [employer]. patient is tangential about previous jobs.     General Comments       Exercises     Shoulder Instructions      Home Living Family/patient expects to be discharged to:: Other (Comment)                                 Additional Comments: pt resides at [facility]. she is not a reliable historian at this time and cannot tell me if she is in [place], ALF or SNF.      Prior Functioning/Environment Prior Level of Function : Patient poor historian/Family not available             Mobility  Comments: unable to determine, pt reports that she has been "working ith PT" at [facility]. states she uses rollator or cane          OT Problem List: Decreased activity tolerance;Impaired balance (sitting and/or standing);Decreased safety awareness;Decreased knowledge of precautions;Decreased knowledge of use of DME or AE      OT Treatment/Interventions: Self-care/ADL training;Therapeutic exercise;Patient/family education;Balance training;Energy conservation;DME and/or AE instruction;Cognitive remediation/compensation    OT Goals(Current goals can be found in the care plan section) Acute Rehab OT Goals Patient Stated Goal: to get back to ALF OT Goal Formulation: With patient Time For Goal Achievement: 10/11/21 Potential to Achieve Goals: Good  OT Frequency: Min 2X/week    Co-evaluation              AM-PAC OT "6 Clicks" Daily Activity     Outcome Measure Help from another person eating meals?: A Little Help from another person taking care of personal grooming?: A Little Help from another person toileting, which includes using toliet, bedpan, or urinal?: A Lot Help from another person bathing (including washing, rinsing, drying)?: A Lot Help from another person to put on and taking off regular upper body clothing?: A Little Help from another person to put  on and taking off regular lower body clothing?: A Lot 6 Click Score: 15   End of Session Equipment Utilized During Treatment: Gait belt;Rolling walker (2 wheels) Nurse Communication: Mobility status  Activity Tolerance: Patient tolerated treatment well Patient left: in bed;with call bell/phone within reach;with bed alarm set  OT Visit Diagnosis: Unsteadiness on feet (R26.81);Other abnormalities of gait and mobility (R26.89);Muscle weakness (generalized) (M62.81)                Time: 6468-0321 OT Time Calculation (min): 25 min Charges:  OT General Charges $OT Visit: 1 Visit OT Evaluation $OT Eval Low Complexity: 1 Low OT  Treatments $Self Care/Home Management : 8-22 mins  Jackelyn Poling OTR/L, MS Acute Rehabilitation Department Office# [phone] Pager# [phone]   Marcellina Millin 09/27/2021, 3:28 PM

## 2021-09-27 NOTE — Anesthesia Procedure Notes (Signed)
Procedure Name: LMA Insertion Date/Time: 09/27/2021 12:25 AM Performed by: Gerald Leitz, CRNA Pre-anesthesia Checklist: Patient identified, Patient being monitored, Timeout performed, Emergency Drugs available and Suction available Patient Re-evaluated:Patient Re-evaluated prior to induction Oxygen Delivery Method: Circle system utilized Preoxygenation: Pre-oxygenation with 100% oxygen Induction Type: IV induction Ventilation: Mask ventilation without difficulty LMA: LMA inserted LMA Size: 4.0 Tube type: Oral Number of attempts: 1 Placement Confirmation: positive ETCO2 and breath sounds checked- equal and bilateral Tube secured with: Tape Dental Injury: Teeth and Oropharynx as per pre-operative assessment

## 2021-09-27 NOTE — Progress Notes (Signed)
Inquired about cpap and pt said she does not wear anything at home. Pt refused cpap.

## 2021-09-27 NOTE — Transfer of Care (Signed)
Immediate Anesthesia Transfer of Care Note  Patient: Katrina [name]  Procedure(s) Performed: Procedure(s): CYSTOSCOPY WITH RETROGRADE PYELOGRAM/URETERAL STENT PLACEMENT (Right)  Patient Location: PACU  Anesthesia Type:General  Level of Consciousness: Alert, Awake, Oriented  Airway & Oxygen Therapy: Patient Spontanous Breathing  Post-op Assessment: Report given to RN  Post vital signs: Reviewed and stable  Last Vitals:  Vitals:   09/26/21 2230 09/26/21 2343  BP: (!) 151/93 133/62  Pulse: (!) 114 (!) 114  Resp: 18   Temp:    SpO2: 36% 06%    Complications: No apparent anesthesia complications

## 2021-09-27 NOTE — Hospital Course (Addendum)
Katrina [name] is a 88 y.o. female with past medical history significant for paroxysmal atrial fibrillation on Eliquis, prior history of DVT and PE, hypertension, prior renal stones presented to hospital with nausea and vomiting with subjective fever and urinary symptoms.  In the ED, patient was noted to have obstructive right UPJ stone with hydronephrosis at 8 mm and UTI on urine exam.  WBC was elevated at 17 K, tachycardia with mild grade fever with acute kidney injury with creatinine elevated at 2.2 from 0.8 baseline.  Patient was then admitted hospital for IV fluids, antibiotic and was taken to the operating room by urology.  Patient underwent ureteric stent placement and was stable postprocedure.  Currently awaiting for skilled nursing facility placement.

## 2021-09-27 NOTE — Progress Notes (Signed)
Pt refused cpap tonight again. Per pt she doesn't have one at home. Never worn one before.

## 2021-09-27 NOTE — Plan of Care (Signed)

## 2021-09-28 ENCOUNTER — Encounter (HOSPITAL_COMMUNITY): Payer: Self-pay | Admitting: Urology

## 2021-09-28 LAB — CBC WITH DIFFERENTIAL/PLATELET
Abs Immature Granulocytes: 0.12 10*3/uL — ABNORMAL HIGH (ref 0.00–0.07)
Basophils Absolute: 0 10*3/uL (ref 0.0–0.1)
Basophils Relative: 0 %
Eosinophils Absolute: 0 10*3/uL (ref 0.0–0.5)
Eosinophils Relative: 0 %
HCT: 31.9 % — ABNORMAL LOW (ref 36.0–46.0)
Hemoglobin: 10.3 g/dL — ABNORMAL LOW (ref 12.0–15.0)
Immature Granulocytes: 1 %
Lymphocytes Relative: 5 %
Lymphs Abs: 0.6 10*3/uL — ABNORMAL LOW (ref 0.7–4.0)
MCH: 28.5 pg (ref 26.0–34.0)
MCHC: 32.3 g/dL (ref 30.0–36.0)
MCV: 88.4 fL (ref 80.0–100.0)
Monocytes Absolute: 0.5 10*3/uL (ref 0.1–1.0)
Monocytes Relative: 4 %
Neutro Abs: 10.5 10*3/uL — ABNORMAL HIGH (ref 1.7–7.7)
Neutrophils Relative %: 90 %
Platelets: 112 10*3/uL — ABNORMAL LOW (ref 150–400)
RBC: 3.61 MIL/uL — ABNORMAL LOW (ref 3.87–5.11)
RDW: 17.1 % — ABNORMAL HIGH (ref 11.5–15.5)
WBC: 11.7 10*3/uL — ABNORMAL HIGH (ref 4.0–10.5)
nRBC: 0 % (ref 0.0–0.2)

## 2021-09-28 LAB — BASIC METABOLIC PANEL
Anion gap: 7 (ref 5–15)
BUN: 39 mg/dL — ABNORMAL HIGH (ref 8–23)
CO2: 24 mmol/L (ref 22–32)
Calcium: 8.4 mg/dL — ABNORMAL LOW (ref 8.9–10.3)
Chloride: 104 mmol/L (ref 98–111)
Creatinine, Ser: 1.55 mg/dL — ABNORMAL HIGH (ref 0.44–1.00)
GFR, Estimated: 32 mL/min — ABNORMAL LOW (ref >60)
Glucose, Bld: 174 mg/dL — ABNORMAL HIGH (ref 70–99)
Potassium: 3.7 mmol/L (ref 3.5–5.1)
Sodium: 135 mmol/L (ref 135–145)

## 2021-09-28 LAB — MAGNESIUM: Magnesium: 2 mg/dL (ref 1.7–2.4)

## 2021-09-28 LAB — PHOSPHORUS: Phosphorus: 3.1 mg/dL (ref 2.5–4.6)

## 2021-09-28 MED ORDER — AMOXICILLIN-POT CLAVULANATE 500-125 MG PO TABS: 1.0000 | ORAL_TABLET | Freq: Two times a day (BID) | ORAL | 0 refills | Status: AC

## 2021-09-28 NOTE — TOC Initial Note (Addendum)
Transition of Care [facility]) - Initial/Assessment Note    Patient Details  Name: Katrina [name] MRN: [id number] Date of Birth: 11/09/1931  [facility]) CM/SW Contact:    Leeroy Cha, RN Phone Number: 09/28/2021, 11:57 AM  Clinical Narrative:                  Transition of Care (TOC) Screening Note Patient is planning t0 [facility] sn[facility] s/p [facility]. Patient is being dcd back to her alf at [facility].  TCT-katie and representative for the alf.  Please send back via ptar. Information packet for ptar given to the unit clerk. Patient Details  Name: Katrina [name] Date of Birth: July 17, 1932   Transition of Care [facility]) CM/SW Contact:    Leeroy Cha, RN Phone Number: 09/28/2021, 11:57 AM    Transition of Care Department [facility]) has reviewed patient and no TOC needs have been identified at this time. We will continue to monitor patient advancement through interdisciplinary progression rounds. If new patient transition needs arise, please place a TOC consult.    Expected Discharge Plan: Home/Self Care Barriers to Discharge: Continued Medical Work up   Patient Goals and CMS Choice Patient states their goals for this hospitalization and ongoing recovery are:: to go back home   Choice offered to / list presented to : Patient  Expected Discharge Plan and Services Expected Discharge Plan: Home/Self Care   Discharge Planning Services: CM Consult   Living arrangements for the past 2 months: Apartment                                      Prior Living Arrangements/Services Living arrangements for the past 2 months: Apartment Lives with:: Self Patient language and need for interpreter reviewed:: Yes              Criminal Activity/Legal Involvement Pertinent to Current Situation/Hospitalization: No - Comment as needed  Activities of Daily Living Home Assistive Devices/Equipment: Eyeglasses ADL Screening (condition at time of  admission) Patient's cognitive ability adequate to safely complete daily activities?: Yes Is the patient deaf or have difficulty hearing?: No Does the patient have difficulty seeing, even when wearing glasses/contacts?: No Does the patient have difficulty concentrating, remembering, or making decisions?: Yes Patient able to express need for assistance with ADLs?: Yes Does the patient have difficulty dressing or bathing?: Yes Independently performs ADLs?: No Communication: Independent Dressing (OT): Needs assistance Is this a change from baseline?: Pre-admission baseline Grooming: Needs assistance Is this a change from baseline?: Pre-admission baseline Feeding: Independent Bathing: Needs assistance Is this a change from baseline?: Pre-admission baseline Toileting: Needs assistance Is this a change from baseline?: Pre-admission baseline In/Out Bed: Needs assistance Is this a change from baseline?: Pre-admission baseline Walks in Home: Needs assistance Is this a change from baseline?: Pre-admission baseline Does the patient have difficulty walking or climbing stairs?: Yes Weakness of Legs: Both (stand but not walk) Weakness of Arms/Hands: None  Permission Sought/Granted                  Emotional Assessment Appearance:: Appears stated age     Orientation: : Oriented to Self, Oriented to Place, Oriented to  Time, Oriented to Situation Alcohol / Substance Use: Not Applicable Psych Involvement: No (comment)  Admission diagnosis:  Pyohydronephrosis [N13.6] Patient Active Problem List   Diagnosis Date Noted   Pyohydronephrosis 09/26/2021   Acquired  thrombophilia ([place]) 02/18/2021   AKI (acute kidney injury) ([place]) 02/18/2021   Acute respiratory failure ([place]) 02/18/2021   Anemia 02/18/2021   Candidal intertrigo 02/18/2021   Cellulitis of right lower limb 02/18/2021   Chronic venous hypertension (idiopathic) with inflammation of right lower extremity 02/18/2021   Gait  abnormality 02/18/2021   General weakness 02/18/2021   Iron deficiency anemia due to chronic blood loss 02/18/2021   Localized edema 08/15/4974   Metabolic encephalopathy 30/12/1100   Pneumonia 02/18/2021   Pressure injury of sacral region, stage 3 ([place]) 02/18/2021   Problem related to unspecified psychosocial circumstances 02/18/2021   Rectal bleeding 02/18/2021   Sepsis ([place]) 02/18/2021   Urinary tract infectious disease 02/18/2021   Unspecified open wound of left buttock, initial encounter 02/18/2021   Local infection of the skin and subcutaneous tissue, unspecified 11/08/2019   Right lower quadrant pain 11/08/2019   Paroxysmal atrial fibrillation ([place]) 01/04/2019   Disorder of the skin and subcutaneous tissue, unspecified 11/07/2017   Sleep apnea 01/10/2017   Hypertension 01/10/2017   Hypothyroidism 01/10/2017   Overactive bladder 01/10/2017   Vertigo 01/10/2017   Elevated lipids 01/10/2017   Disequilibrium 10/05/2016   Nasal congestion 08/26/2016   Morbid obesity ([place]) 10/03/2015   Encounter for general adult medical examination without abnormal findings 09/26/2015   Impaired fasting glucose 09/26/2014   Long term (current) use of anticoagulants 09/26/2014   Varicose veins of lower extremity with ulcer ([place]) 05/02/2013   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   Underimmunization status 05/04/2010   Personal history of other diseases of urinary system 01/28/2010   Thromboembolism of vein 09/09/2009   History of gastrointestinal disease 07/28/2009   Major depression, single episode 07/28/2009   Peripheral venous insufficiency 07/28/2009   Supraventricular tachycardia ([place]) 07/28/2009   PCP:  Donnajean Lopes, MD Pharmacy:   [facility] [address] [place] - [address] [address] Phone: [phone] Fax: [phone]     Social Determinants of Health (SDOH)  Interventions    Readmission Risk Interventions No flowsheet data found.

## 2021-09-28 NOTE — NC FL2 (Deleted)
[place] MEDICAID FL2 LEVEL OF CARE SCREENING TOOL     IDENTIFICATION  Patient Name: Katrina [name] Birthdate: [birth year] Sex: female Admission Date (Current Location): 09/26/2021  [facility] and [place] Number:  Herbalist and Address:  [facility],  [place] [place], [place]      Provider Number: [id number]  Attending Physician Name and Address:  Flora Lipps, MD  Relative Name and Phone Number:       Current Level of Care: Hospital Recommended Level of Care: [place] Prior Approval Number:    Date Approved/Denied:   PASRR Number: [id number]  Discharge Plan: Other (Comment) (assisted living facility)    Current Diagnoses: Patient Active Problem List   Diagnosis Date Noted   Pyohydronephrosis 09/26/2021   Acquired thrombophilia ([place]) 02/18/2021   AKI (acute kidney injury) ([place]) 02/18/2021   Acute respiratory failure ([place]) 02/18/2021   Anemia 02/18/2021   Candidal intertrigo 02/18/2021   Cellulitis of right lower limb 02/18/2021   Chronic venous hypertension (idiopathic) with inflammation of right lower extremity 02/18/2021   Gait abnormality 02/18/2021   General weakness 02/18/2021   Iron deficiency anemia due to chronic blood loss 02/18/2021   Localized edema 49/44/9675   Metabolic encephalopathy 91/63/8466   Pneumonia 02/18/2021   Pressure injury of sacral region, stage 3 ([place]) 02/18/2021   Problem related to unspecified psychosocial circumstances 02/18/2021   Rectal bleeding 02/18/2021   Sepsis ([place]) 02/18/2021   Urinary tract infectious disease 02/18/2021   Unspecified open wound of left buttock, initial encounter 02/18/2021   Local infection of the skin and subcutaneous tissue, unspecified 11/08/2019   Right lower quadrant pain 11/08/2019   Paroxysmal atrial fibrillation ([place]) 01/04/2019   Disorder of the skin and subcutaneous tissue, unspecified 11/07/2017   Sleep apnea 01/10/2017   Hypertension 01/10/2017    Hypothyroidism 01/10/2017   Overactive bladder 01/10/2017   Vertigo 01/10/2017   Elevated lipids 01/10/2017   Disequilibrium 10/05/2016   Nasal congestion 08/26/2016   Morbid obesity ([place]) 10/03/2015   Encounter for general adult medical examination without abnormal findings 09/26/2015   Impaired fasting glucose 09/26/2014   Long term (current) use of anticoagulants 09/26/2014   Varicose veins of lower extremity with ulcer ([place]) 05/02/2013   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   Underimmunization status 05/04/2010   Personal history of other diseases of urinary system 01/28/2010   Thromboembolism of vein 09/09/2009   History of gastrointestinal disease 07/28/2009   Major depression, single episode 07/28/2009   Peripheral venous insufficiency 07/28/2009   Supraventricular tachycardia ([place]) 07/28/2009    Orientation RESPIRATION BLADDER Height & Weight     Self, Time, Situation, Place  Normal Continent Weight: 94.4 kg Height:  5\' 1"  (154.9 cm)  BEHAVIORAL SYMPTOMS/MOOD NEUROLOGICAL BOWEL NUTRITION STATUS      Continent Diet (regular)  AMBULATORY STATUS COMMUNICATION OF NEEDS Skin   Limited Assist Verbally Normal                       Personal Care Assistance Level of Assistance  Bathing, Feeding, Dressing Bathing Assistance: Limited assistance Feeding assistance: Limited assistance Dressing Assistance: Limited assistance     Functional Limitations Info  Sight, Hearing, Speech Sight Info: Adequate Hearing Info: Adequate Speech Info: Adequate    SPECIAL CARE FACTORS FREQUENCY                       Contractures Contractures Info: Not present    Additional  Factors Info  Code Status Code Status Info: full             Current Medications (09/28/2021):  This is the current hospital active medication list Current Facility-Administered Medications  Medication Dose Route Frequency Provider Last Rate Last Admin   acetaminophen (TYLENOL)  tablet 650 mg  650 mg Oral Q6H PRN Etta Quill, DO       Or   acetaminophen (TYLENOL) suppository 650 mg  650 mg Rectal Q6H PRN Etta Quill, DO       apixaban Arne [place]) tablet 2.5 mg  2.5 mg Oral BID Jennette Kettle M, DO   2.5 mg at 09/28/21 0900   Chlorhexidine Gluconate Cloth 2 % PADS 6 each  6 each Topical Daily Barb Merino, MD   6 each at 09/28/21 1121   digoxin (LANOXIN) tablet 0.25 mg  0.25 mg Oral Daily Jennette Kettle M, DO   0.25 mg at 09/27/21 1100   lactated ringers infusion   Intravenous Continuous Etta Quill, DO   Stopped at 09/27/21 1709   levothyroxine (SYNTHROID) tablet 125 mcg  125 mcg Oral Q0600 Etta Quill, DO   125 mcg at 09/28/21 5859   MEDLINE mouth rinse  15 mL Mouth Rinse BID Jennette Kettle M, DO   15 mL at 09/28/21 1122   metoprolol succinate (TOPROL-XL) 24 hr tablet 50 mg  50 mg Oral Daily Jennette Kettle M, DO   50 mg at 09/27/21 1036   ondansetron (ZOFRAN) tablet 4 mg  4 mg Oral Q6H PRN Etta Quill, DO       Or   ondansetron [facility]) injection 4 mg  4 mg Intravenous Q6H PRN Etta Quill, DO       piperacillin-tazobactam (ZOSYN) IVPB 3.375 g  3.375 g Intravenous Q8H Wofford, Drew A, RPH 12.5 mL/hr at 09/28/21 1121 3.375 g at 09/28/21 1121   prenatal multivitamin tablet 1 tablet  1 tablet Oral Daily Jennette Kettle M, DO   1 tablet at 09/28/21 0900     Discharge Medications: Please see discharge summary for a list of discharge medications.  Relevant Imaging Results:  Relevant Lab Results:   Additional Information [id number]  Leeroy Cha, RN

## 2021-09-28 NOTE — Progress Notes (Signed)
Pt refusing CPAP QHS.

## 2021-09-28 NOTE — Anesthesia Postprocedure Evaluation (Signed)
Anesthesia Post Note  Patient: Katrina [name]  Procedure(s) Performed: CYSTOSCOPY WITH RETROGRADE PYELOGRAM/URETERAL STENT PLACEMENT (Right: Ureter)     Patient location during evaluation: PACU Anesthesia Type: General Level of consciousness: awake and alert Pain management: pain level controlled Vital Signs Assessment: post-procedure vital signs reviewed and stable Respiratory status: spontaneous breathing, nonlabored ventilation, respiratory function stable and patient connected to nasal cannula oxygen Cardiovascular status: blood pressure returned to baseline and stable Postop Assessment: no apparent nausea or vomiting Anesthetic complications: no   No notable events documented.  Last Vitals:  Vitals:   09/27/21 2100 09/28/21 0403  BP: 138/66 (!) 149/55  Pulse: 63 64  Resp: 18 20  Temp: 36.7 C 37.1 C  SpO2: 94% 96%    Last Pain:  Vitals:   09/28/21 0550  TempSrc:   PainSc: 0-No pain                 Santa Lighter

## 2021-09-28 NOTE — Discharge Summary (Addendum)
Physician Discharge Summary   Patient: Katrina [name] MRN: [id number] DOB: 1931/11/21  Admit date:     09/26/2021  Discharge date: 09/28/21  Discharge Physician: Flora Lipps   PCP: Donnajean Lopes, MD   Recommendations at discharge:   Please follow-up with Dr. Jeffie Pollock urology as outpatient for definitive management of renal stone. Follow-up with your primary care provider at the skilled nursing facility in 3 to 5 days.  Check CBC, BMP in 3 to 5 days.  Discharge Diagnoses: Principal Problem:   Pyohydronephrosis Active Problems:   Sleep apnea   Hypertension   AKI (acute kidney injury) ([place])   Paroxysmal atrial fibrillation (H[facility])  Resolved Problems:   * No resolved hospital problems. *   Hospital Course: Katrina [name] is a 88 y.o. female with past medical history significant for paroxysmal atrial fibrillation on Eliquis, prior history of DVT and PE, hypertension, prior renal stones presented to hospital with nausea and vomiting with subjective fever and urinary symptoms.  In the ED, patient was noted to have obstructive right UPJ stone with hydronephrosis at 8 mm and UTI on urine exam.  WBC was elevated at 17 K, tachycardia with mild grade fever with acute kidney injury with creatinine elevated at 2.2 from 0.8 baseline.  Patient was then admitted hospital for IV fluids antibiotic and was taken to the operating room by urology   Assessment and Plan: * Pyohydronephrosis- (present on admission) Status post cystoscopy and right ureteric stent placement by urology.  Foley catheter has been discontinued today.  Patient received IV Zosyn in hospitalization which she tolerated well..  Urine cultures showed 40,000 colony of E. Coli.patient has clinically improved at this time.  Urology is okay about the patient being discharged home.  Patient may need staged stone removal, urology will schedule follow-up with the patient as outpatient.   Paroxysmal atrial fibrillation ([place])- (present on  admission) Continue digoxin, beta-blocker and Eliquis on discharge.Marland Kitchen  AKI (acute kidney injury) ([place])- (present on admission) Likely postrenal from obstructive renal stone.  Slightly improved.  Creatinine of 1.5 today.  Urine output adequate.  Will need follow-up with BMP in 3 to 5 days.  Hypertension- (present on admission) Resume Lasix beta-blocker on discharge  Sleep apnea- (present on admission) She does not wear CPAP.  Debility weakness.  Patient states that she does walk with help of walker at baseline.  We will continue on discharge.  Patient is from assisted living facility with  Disposition.  At this time patient is stable for disposition to assisted living facility.  Spoke with the patient's sister in law Ms Willaim Rayas about plan for disposition.  Consultants: Urology  Procedures performed: Cystoscopy with right retrograde pyelogram and right ureteric stent placement on 09/27/2021 by Dr. Junious Silk, urology.  Disposition: SNF  Diet recommendation:  Discharge Diet Orders (From admission, onward)     Start     Ordered   09/28/21 0000  Diet general        09/28/21 1417           Regular diet  DISCHARGE MEDICATION: Allergies as of 09/28/2021       Reactions   Codeine    Other reaction(s): nausea   Cephalosporins Rash   Dermatitis to keflex in 2012- Has tolerated several courses since that time-  Most recently 2021. Notably patient has stasis dermatitis that developed around the same time.         Medication List     TAKE these medications    amoxicillin-clavulanate  500-125 MG tablet Commonly known as: Augmentin Take 1 tablet (500 mg total) by mouth 2 (two) times daily for 7 days.   apixaban 2.5 MG Tabs tablet Commonly known as: ELIQUIS Take 1 tablet by mouth 2 (two) times daily.   digoxin 0.125 MG tablet Commonly known as: LANOXIN Take 0.25 mg by mouth daily. Take 2 tablets (0.25 mg) daily   furosemide 20 MG tablet Commonly known as: LASIX Take 20 mg by  mouth daily.   ketoconazole 2 % cream Commonly known as: NIZORAL Apply 1 application topically daily.   levothyroxine 125 MCG tablet Commonly known as: SYNTHROID Take 1 tablet by mouth daily.   metoprolol succinate 50 MG 24 hr tablet Commonly known as: TOPROL-XL Take 50 mg by mouth daily.   potassium chloride 10 MEQ CR capsule Commonly known as: MICRO-K Take 10 mEq by mouth daily.   Prenatal Vitamin Plus Low Iron 27-1 MG Tabs Take 1 tablet by mouth daily.        Follow-up Information     Irine Seal, MD. Call.   Specialty: Urology Contact information: [place] [address] [phone]         Donnajean Lopes, MD Follow up.   Specialty: Internal Medicine Contact information: [address] [place] [address] [phone]                Subjective.  Patient feels  better today.  Denies any urinary urgency frequency dysuria.  Has been able to urinate after removal of Foley catheter  Discharge Exam:  Filed Weights   09/26/21 1948 09/27/21 0140  Weight: 90.7 kg 94.4 kg   Vitals with BMI 09/28/2021 09/28/2021 09/28/2021  Height - - -  Weight - - -  BMI - - -  Systolic 381 - 829  Diastolic 67 - 55  Pulse 56 53 64   Body mass index is 39.32 kg/m.   General: Obese built, not in obvious distress HENT:   No scleral pallor or icterus noted. Oral mucosa is moist.  Chest:  Clear breath sounds.  Diminished breath sounds bilaterally. No crackles or wheezes.  CVS: S1 &S2 heard. No murmur.  Regular rate and rhythm. Abdomen: Soft, nontender, nondistended.  Bowel sounds are heard.   Extremities: No cyanosis, clubbing or edema.  Peripheral pulses are palpable. Psych: Alert, awake and oriented, normal mood CNS:  No cranial nerve deficits.  Power equal in all extremities.   Skin: Warm and dry.  No rashes noted.   Condition at discharge: good  The results of significant diagnostics from this hospitalization (including imaging, microbiology,  ancillary and laboratory) are listed below for reference.   Imaging Studies: CT Abdomen Pelvis Wo Contrast  Result Date: 09/26/2021 CLINICAL DATA:  Abdominal pain, acute, nonlocalized. Vomiting over the last 2 days. EXAM: CT ABDOMEN AND PELVIS WITHOUT CONTRAST TECHNIQUE: Multidetector CT imaging of the abdomen and pelvis was performed following the standard protocol without IV contrast. RADIATION DOSE REDUCTION: This exam was performed according to the departmental dose-optimization program which includes automated exposure control, adjustment of the mA and/or kV according to patient size and/or use of iterative reconstruction technique. COMPARISON:  CT 04/01/2011 FINDINGS: Lower chest: Scarring and bronchiectasis in both lower lobes. There is some dependent atelectasis, right more than left. Small hiatal hernia is noted. Hepatobiliary: Cirrhosis of the liver with relative enlargement of the left lobe and slight lobular surface. Previous cholecystectomy. Focal calcification in the left lobe slightly more prominent than was seen in 2012.  Liver cysts in the right and left lobe are smaller than were seen in 2012. Pancreas: Negative Spleen: Mild splenomegaly.  No focal lesion. Adrenals/Urinary Tract: Adrenal glands are normal. The left kidney is normal. There is hydroureteronephrosis on the right due 2 an 8 mm stone at the UPJ. Retroperitoneal edema consistent with pyelo sinus extravasation. No stone seen distal to that. No stone in the bladder. Stomach/Bowel: Small hiatal hernia as noted above. Small bowel is normal. There is diverticulosis of the left colon but no evidence of diverticulitis. Vascular/Lymphatic: Aortic atherosclerosis. No aneurysm. IVC is normal. No adenopathy. Reproductive: No pelvic mass. Other: No free fluid or air. Musculoskeletal: Chronic spinal degenerative changes. IMPRESSION: Hydroureteronephrosis on the right because of an 8 mm stone at the right UPJ. Retroperitoneal edema consistent with  pyelo sinus extravasation. Cirrhosis of the liver, newly demonstrated since the prior study of 2012. Splenomegaly consistent with portal venous hypertension. Scarring and bronchiectasis in the lower lungs with dependent atelectasis right more than left. Electronically Signed   By: Nelson Chimes M.D.   On: 09/26/2021 22:48   DG Chest Port 1 View  Result Date: 09/26/2021 CLINICAL DATA:  Cough.  Vomiting for 2 days. EXAM: PORTABLE CHEST 1 VIEW COMPARISON:  04/06/2020 FINDINGS: Normal heart size and pulmonary vascularity. No focal airspace disease or consolidation in the lungs. No blunting of costophrenic angles. No pneumothorax. Mediastinal contours appear intact. Calcification of the aorta. Degenerative changes in the spine and shoulders. IMPRESSION: No active disease. Electronically Signed   By: Lucienne Capers M.D.   On: 09/26/2021 21:30   DG C-Arm 1-60 Min-No Report  Result Date: 09/27/2021 Fluoroscopy was utilized by the requesting physician.  No radiographic interpretation.    Microbiology: Results for orders placed or performed during the hospital encounter of 09/26/21  Resp Panel by RT-PCR (Flu A&B, Covid) Nasopharyngeal Swab     Status: None   Collection Time: 09/26/21  9:29 PM   Specimen: Nasopharyngeal Swab; Nasopharyngeal(NP) swabs in vial transport medium  Result Value Ref Range Status   SARS Coronavirus 2 by RT PCR NEGATIVE NEGATIVE Final    Comment: (NOTE) SARS-CoV-2 target nucleic acids are NOT DETECTED.  The SARS-CoV-2 RNA is generally detectable in upper respiratory specimens during the acute phase of infection. The lowest concentration of SARS-CoV-2 viral copies this assay can detect is 138 copies/mL. A negative result does not preclude SARS-Cov-2 infection and should not be used as the sole basis for treatment or other patient management decisions. A negative result may occur with  improper specimen collection/handling, submission of specimen other than nasopharyngeal swab,  presence of viral mutation(s) within the areas targeted by this assay, and inadequate number of viral copies(<138 copies/mL). A negative result must be combined with clinical observations, patient history, and epidemiological information. The expected result is Negative.  Fact Sheet for Patients:  EntrepreneurPulse.com.au  Fact Sheet for Healthcare Providers:  IncredibleEmployment.be  This test is no t yet approved or cleared by the [place] FDA and  has been authorized for detection and/or diagnosis of SARS-CoV-2 by FDA under an Emergency Use Authorization (EUA). This EUA will remain  in effect (meaning this test can be used) for the duration of the COVID-19 declaration under Section 564(b)(1) of the Act, 21 U.S.C.section 360bbb-3(b)(1), unless the authorization is terminated  or revoked sooner.       Influenza A by PCR NEGATIVE NEGATIVE Final   Influenza B by PCR NEGATIVE NEGATIVE Final    Comment: (NOTE) The Xpert Xpress SARS-CoV-2/FLU/RSV plus  assay is intended as an aid in the diagnosis of influenza from Nasopharyngeal swab specimens and should not be used as a sole basis for treatment. Nasal washings and aspirates are unacceptable for Xpert Xpress SARS-CoV-2/FLU/RSV testing.  Fact Sheet for Patients: EntrepreneurPulse.com.au  Fact Sheet for Healthcare Providers: IncredibleEmployment.be  This test is not yet approved or cleared by the [place] FDA and has been authorized for detection and/or diagnosis of SARS-CoV-2 by FDA under an Emergency Use Authorization (EUA). This EUA will remain in effect (meaning this test can be used) for the duration of the COVID-19 declaration under Section 564(b)(1) of the Act, 21 U.S.C. section 360bbb-3(b)(1), unless the authorization is terminated or revoked.  Performed at [facility], [place] [address] [place], [place] [address]   Urine  Culture     Status: Abnormal (Preliminary result)   Collection Time: 09/27/21 12:44 AM   Specimen: Urine, Cystoscope  Result Value Ref Range Status   Specimen Description   Final    CYSTOSCOPY URINE, RANDOM Performed at [facility], [place] [address], [place], [place] [address]    Special Requests   Final    NONE Performed at [facility], [place] [address], [place], [place] [address]    Culture (A)  Final    40,000 COLONIES/mL ESCHERICHIA COLI SUSCEPTIBILITIES TO FOLLOW CULTURE REINCUBATED FOR BETTER GROWTH Performed at [facility], [place] [address], [place], [place] [address]    Report Status PENDING  Incomplete    Labs: CBC: Recent Labs  Lab 09/26/21 2036 09/27/21 0353 09/28/21 0356  WBC 17.4* 11.2* 11.7*  NEUTROABS  --   --  10.5*  HGB 12.9 10.9* 10.3*  HCT 40.3 34.1* 31.9*  MCV 89.8 90.5 88.4  PLT 134* 100* 174*   Basic Metabolic Panel: Recent Labs  Lab 09/26/21 2036 09/27/21 0353 09/28/21 0356  NA 133* 134* 135  K 4.3 4.0 3.7  CL 100 101 104  CO2 22 23 24   GLUCOSE 214* 208* 174*  BUN 33* 35* 39*  CREATININE 2.23* 2.00* 1.55*  CALCIUM 8.7* 8.2* 8.4*  MG  --   --  2.0  PHOS  --   --  3.1   Liver Function Tests: Recent Labs  Lab 09/26/21 2036  AST 43*  ALT 17  ALKPHOS 100  BILITOT 1.4*  PROT 8.6*  ALBUMIN 3.5   CBG: No results for input(s): GLUCAP in the last 168 hours.  Discharge time spent: greater than 30 minutes.  Signed: Flora Lipps, MD [facility] 09/28/2021

## 2021-09-28 NOTE — [place] FL2 (Signed)
[place] MEDICAID FL2 LEVEL OF CARE SCREENING TOOL     IDENTIFICATION  Patient Name: Katrina [name] Birthdate: 15-Jan-1932 Sex: female Admission Date (Current Location): 09/26/2021  [facility] [place] Number:  Herbalist and Address:  [facility],  [place], [place]      Provider Number: [id number]  Attending Physician Name and Address:  Flora Lipps, MD  Relative Name and Phone Number:       Current Level of Care: Hospital Recommended Level of Care: [place] Prior Approval Number:    Date Approved/Denied:   PASRR Number: [id number]  Discharge Plan: SNF    Current Diagnoses: Patient Active Problem List   Diagnosis Date Noted   Pyohydronephrosis 09/26/2021   Acquired thrombophilia ([place]) 02/18/2021   AKI (acute kidney injury) ([place]) 02/18/2021   Acute respiratory failure ([place]) 02/18/2021   Anemia 02/18/2021   Candidal intertrigo 02/18/2021   Cellulitis of right lower limb 02/18/2021   Chronic venous hypertension (idiopathic) with inflammation of right lower extremity 02/18/2021   Gait abnormality 02/18/2021   General weakness 02/18/2021   Iron deficiency anemia due to chronic blood loss 02/18/2021   Localized edema 42/68/3419   Metabolic encephalopathy 62/22/9798   Pneumonia 02/18/2021   Pressure injury of sacral region, stage 3 ([place]) 02/18/2021   Problem related to unspecified psychosocial circumstances 02/18/2021   Rectal bleeding 02/18/2021   Sepsis ([place]) 02/18/2021   Urinary tract infectious disease 02/18/2021   Unspecified open wound of left buttock, initial encounter 02/18/2021   Local infection of the skin and subcutaneous tissue, unspecified 11/08/2019   Right lower quadrant pain 11/08/2019   Paroxysmal atrial fibrillation ([place]) 01/04/2019   Disorder of the skin and subcutaneous tissue, unspecified 11/07/2017   Sleep apnea 01/10/2017   Hypertension 01/10/2017   Hypothyroidism 01/10/2017    Overactive bladder 01/10/2017   Vertigo 01/10/2017   Elevated lipids 01/10/2017   Disequilibrium 10/05/2016   Nasal congestion 08/26/2016   Morbid obesity ([place]) 10/03/2015   Encounter for general adult medical examination without abnormal findings 09/26/2015   Impaired fasting glucose 09/26/2014   Long term (current) use of anticoagulants 09/26/2014   Varicose veins of lower extremity with ulcer ([place]) 05/02/2013   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   Underimmunization status 05/04/2010   Personal history of other diseases of urinary system 01/28/2010   Thromboembolism of vein 09/09/2009   History of gastrointestinal disease 07/28/2009   Major depression, single episode 07/28/2009   Peripheral venous insufficiency 07/28/2009   Supraventricular tachycardia ([place]) 07/28/2009    Orientation RESPIRATION BLADDER Height & Weight     Self, Time, Situation, Place  Normal Continent Weight: 94.4 kg Height:  5\' 1"  (154.9 cm)  BEHAVIORAL SYMPTOMS/MOOD NEUROLOGICAL BOWEL NUTRITION STATUS      Continent Diet (regular)  AMBULATORY STATUS COMMUNICATION OF NEEDS Skin   Extensive Assist Verbally Normal                       Personal Care Assistance Level of Assistance  Bathing, Feeding, Dressing Bathing Assistance: Limited assistance Feeding assistance: Limited assistance Dressing Assistance: Limited assistance     Functional Limitations Info  Sight, Hearing, Speech Sight Info: Adequate Hearing Info: Adequate Speech Info: Adequate    SPECIAL CARE FACTORS FREQUENCY  PT (By licensed PT), OT (By licensed OT)     PT Frequency: 5 x weekly OT Frequency: 5 x weekly            Contractures  Contractures Info: Not present    Additional Factors Info  Code Status Code Status Info: full             Current Medications (09/28/2021):  This is the current hospital active medication list Current Facility-Administered Medications  Medication Dose Route Frequency  Provider Last Rate Last Admin   acetaminophen (TYLENOL) tablet 650 mg  650 mg Oral Q6H PRN Etta Quill, DO       Or   acetaminophen (TYLENOL) suppository 650 mg  650 mg Rectal Q6H PRN Etta Quill, DO       apixaban Arne [place]) tablet 2.5 mg  2.5 mg Oral BID Jennette Kettle M, DO   2.5 mg at 09/28/21 0900   Chlorhexidine Gluconate Cloth 2 % PADS 6 each  6 each Topical Daily Barb Merino, MD   6 each at 09/28/21 1121   digoxin (LANOXIN) tablet 0.25 mg  0.25 mg Oral Daily Jennette Kettle M, DO   0.25 mg at 09/27/21 1100   lactated ringers infusion   Intravenous Continuous Etta Quill, DO   Stopped at 09/27/21 1709   levothyroxine (SYNTHROID) tablet 125 mcg  125 mcg Oral Q0600 Etta Quill, DO   125 mcg at 09/28/21 6226   MEDLINE mouth rinse  15 mL Mouth Rinse BID Jennette Kettle M, DO   15 mL at 09/28/21 1122   metoprolol succinate (TOPROL-XL) 24 hr tablet 50 mg  50 mg Oral Daily Jennette Kettle M, DO   50 mg at 09/27/21 1036   ondansetron (ZOFRAN) tablet 4 mg  4 mg Oral Q6H PRN Etta Quill, DO       Or   ondansetron [facility]) injection 4 mg  4 mg Intravenous Q6H PRN Etta Quill, DO       piperacillin-tazobactam (ZOSYN) IVPB 3.375 g  3.375 g Intravenous Q8H Wofford, Drew A, RPH 12.5 mL/hr at 09/28/21 1121 3.375 g at 09/28/21 1121   prenatal multivitamin tablet 1 tablet  1 tablet Oral Daily Jennette Kettle M, DO   1 tablet at 09/28/21 0900     Discharge Medications: Please see discharge summary for a list of discharge medications.  Relevant Imaging Results:  Relevant Lab Results:   Additional Information [id number]  Leeroy Cha, RN

## 2021-09-29 DIAGNOSIS — R5381 Other malaise: Secondary | ICD-10-CM

## 2021-09-29 LAB — BASIC METABOLIC PANEL
Anion gap: 7 (ref 5–15)
BUN: 41 mg/dL — ABNORMAL HIGH (ref 8–23)
CO2: 24 mmol/L (ref 22–32)
Calcium: 8.3 mg/dL — ABNORMAL LOW (ref 8.9–10.3)
Chloride: 105 mmol/L (ref 98–111)
Creatinine, Ser: 1.32 mg/dL — ABNORMAL HIGH (ref 0.44–1.00)
GFR, Estimated: 39 mL/min — ABNORMAL LOW (ref >60)
Glucose, Bld: 139 mg/dL — ABNORMAL HIGH (ref 70–99)
Potassium: 3.7 mmol/L (ref 3.5–5.1)
Sodium: 136 mmol/L (ref 135–145)

## 2021-09-29 LAB — URINE CULTURE: Culture: 40000 — AB

## 2021-09-29 NOTE — Assessment & Plan Note (Signed)
At baseline patient uses a walker for ambulation and is from assisted living facility.  At this time physical therapy has recommended skilled nursing facility on discharge.

## 2021-09-29 NOTE — Progress Notes (Signed)
Katrina [name] is doing well s/p stent placement. Foley to be removed today.     PE: Minimal right flank tenderness.     Labs reviewed.  Culture pending.    She will need ureteroscopy for management of the right ureteral stone when recovered from the infection.  My office will arrange that f/u.

## 2021-09-29 NOTE — TOC Progression Note (Addendum)
Transition of Care [facility]) - Progression Note    Patient Details  Name: Katrina [name] MRN: [id number] Date of Birth: 10-19-1931  Transition of [facility]) CM/SW Contact  Leeroy Cha, RN Phone Number: 09/29/2021, 10:42 AM  Clinical Narrative:    Additional information given to [place] over the phone and system.  Spoke with rep. Will put decision in system.   Expected Discharge Plan: Home/Self Care Barriers to Discharge: Continued Medical Work up  Expected Discharge Plan and Services Expected Discharge Plan: Home/Self Care   Discharge Planning Services: CM Consult   Living arrangements for the past 2 months: Apartment Expected Discharge Date: 09/28/21                                     Social Determinants of Health (SDOH) Interventions    Readmission Risk Interventions No flowsheet data found.

## 2021-09-29 NOTE — Progress Notes (Signed)
Physical Therapy Treatment Patient Details Name: Katrina [name] MRN: [id number] DOB: 1931-09-19 Today's Date: 09/29/2021   History of Present Illness 88 y.o. female  presented to ED on 09/26/21 with c/o N/V.determined to have obstructive 80mm R UPJ stone with hydronephrosis, UTI on UA. s/p urgent right ureteral stent on 09/26/21 per urology. Pt with medical history significant of PAF on eliquis, prior DVT and PE, HTN, prior renal stones.    PT Comments    Pt making gradual progress.  She was limited in distance today due to urinary incontinence and then fatigue.  Needs mod cues for sequencing, focusing, and safety.  Continue to recommend SNF at d/c.    Recommendations for follow up therapy are one component of a multi-disciplinary discharge planning process, led by the attending physician.  Recommendations may be updated based on patient status, additional functional criteria and insurance authorization.  Follow Up Recommendations  Skilled nursing-short term rehab (<3 hours/day)     Assistance Recommended at Discharge Frequent or constant Supervision/Assistance  Patient can return home with the following A little help with walking and/or transfers;Help with stairs or ramp for entrance;Assistance with cooking/housework;Assist for transportation;Direct supervision/assist for financial management   Equipment Recommendations  None recommended by PT    Recommendations for Other Services       Precautions / Restrictions Precautions Precautions: Fall Restrictions Weight Bearing Restrictions: No     Mobility  Bed Mobility Overal bed mobility: Needs Assistance Bed Mobility: Supine to Sit     Supine to sit: Mod assist     General bed mobility comments: Increased time with cues for sequencign and use of bedrail    Transfers Overall transfer level: Needs assistance Equipment used: Rolling walker (2 wheels) Transfers: Sit to/from Stand Sit to Stand: Min assist           General  transfer comment: Cues for hand placement; stood from bed and toilet; required assist with toileting ADLs    Ambulation/Gait Ambulation/Gait assistance: Min assist Gait Distance (Feet): 20 Feet (20'x2) Assistive device: Rolling walker (2 wheels) Gait Pattern/deviations: Step-through pattern, Decreased stride length Gait velocity: decreased     General Gait Details: Cues for RW position and posture; min A to Facilities manager    Modified Rankin (Stroke Patients Only)       Balance Overall balance assessment: Needs assistance Sitting-balance support: No upper extremity supported, Feet supported Sitting balance-Leahy Scale: Fair     Standing balance support: Bilateral upper extremity supported Standing balance-Leahy Scale: Poor Standing balance comment: requiring UE support with toielting ADLs                            Cognition Arousal/Alertness: Awake/alert Behavior During Therapy: Restless Overall Cognitive Status: Impaired/Different from baseline Area of Impairment: Attention                   Current Attention Level: Sustained         Problem Solving: Requires tactile cues, Requires verbal cues General Comments: Very tangential; cues for safety and sequencing at times        Exercises      General Comments General comments (skin integrity, edema, etc.): Increased time due to pt incontinent of urine and toileting ADLS; pt also fatigued after ambulation to/from bathroom and ADLs      Pertinent Vitals/Pain Pain Assessment Pain Assessment: No/denies  pain    Home Living                          Prior Function            PT Goals (current goals can now be found in the care plan section) Progress towards PT goals: Progressing toward goals    Frequency    Min 2X/week      PT Plan Current plan remains appropriate    Co-evaluation              AM-PAC PT "6  Clicks" Mobility   Outcome Measure  Help needed turning from your back to your side while in a flat bed without using bedrails?: A Little Help needed moving from lying on your back to sitting on the side of a flat bed without using bedrails?: A Lot Help needed moving to and from a bed to a chair (including a wheelchair)?: A Little Help needed standing up from a chair using your arms (e.g., wheelchair or bedside chair)?: A Lot (cues) Help needed to walk in hospital room?: A Lot (cues/distance) Help needed climbing 3-5 steps with a railing? : A Lot 6 Click Score: 14    End of Session Equipment Utilized During Treatment: Gait belt Activity Tolerance: Patient limited by fatigue;Other (comment) (limited due to incontinence then fatigue) Patient left: with chair alarm set;in chair;with call bell/phone within reach Nurse Communication: Mobility status PT Visit Diagnosis: Other abnormalities of gait and mobility (R26.89);Muscle weakness (generalized) (M62.81)     Time: 2992-4268 PT Time Calculation (min) (ACUTE ONLY): 29 min  Charges:  $Gait Training: 8-22 mins $Therapeutic Activity: 8-22 mins                     Abran Richard, PT Acute Rehab Services Pager [phone] [facility] [place] 09/29/2021, 1:13 PM

## 2021-09-29 NOTE — TOC Progression Note (Deleted)
Transition of Care [facility]) - Progression Note    Patient Details  Name: Katrina [name] MRN: [id number] Date of Birth: 09-Jan-1932  Transition of Care [facility]) CM/SW Contact  Leeroy Cha, RN Phone Number: 09/29/2021, 10:41 AM  Clinical Narrative:    Jill [facility] at [facility]-will review information and call back.  Presently awaiting insurance auth.   Expected Discharge Plan: Home/Self Care Barriers to Discharge: Continued Medical Work up  Expected Discharge Plan and Services Expected Discharge Plan: Home/Self Care   Discharge Planning Services: CM Consult   Living arrangements for the past 2 months: Apartment Expected Discharge Date: 09/28/21                                     Social Determinants of Health (SDOH) Interventions    Readmission Risk Interventions No flowsheet data found.

## 2021-09-29 NOTE — Progress Notes (Signed)
Occupational Therapy Treatment Patient Details Name: Katrina [name] MRN: [id number] DOB: May 01, 1932 Today's Date: 09/29/2021   History of present illness 88 y.o. female with medical history significant of PAF on eliquis, prior DVT and PE, HTN, prior renal stones. Pt presents to ED with c/o N/V.determined to have obstructive 29mm R UPJ stone with hydronephrosis, UTI on UA. s/p urgent right ureteral stent on 09/26/21 per urology   OT comments  Patient tangential and perseverating on "not wanting to be treated like an invalid." Needing moderate cues throughout session to redirect to tasks. Patient min A to sit upright out of bed and supervision level for ambulation with walker to/from bathroom. Verbal cues provided for safety and min A to initiate threading underwear over bilateral feet. Recommend continued acute OT services to maximize patient safety and endurance needed for self care tasks.     Recommendations for follow up therapy are one component of a multi-disciplinary discharge planning process, led by the attending physician.  Recommendations may be updated based on patient status, additional functional criteria and insurance authorization.    Follow Up Recommendations  Skilled nursing-short term rehab (<3 hours/day)    Assistance Recommended at Discharge Frequent or constant Supervision/Assistance  Patient can return home with the following  A little help with walking and/or transfers;A little help with bathing/dressing/bathroom;Direct supervision/assist for financial management;Direct supervision/assist for medications management;Help with stairs or ramp for entrance;Assist for transportation   Equipment Recommendations  None recommended by OT       Precautions / Restrictions Precautions Precautions: Fall Restrictions Weight Bearing Restrictions: No       Mobility Bed Mobility Overal bed mobility: Needs Assistance Bed Mobility: Supine to Sit     Supine to sit: Min assist      General bed mobility comments: upright trunk       Balance Overall balance assessment: Needs assistance Sitting-balance support: No upper extremity supported, Feet supported Sitting balance-Leahy Scale: Fair     Standing balance support: No upper extremity supported, During functional activity Standing balance-Leahy Scale: Fair Standing balance comment: wash hands at sink                           ADL either performed or assessed with clinical judgement   ADL Overall ADL's : Needs assistance/impaired     Grooming: Wash/dry hands;Supervision/safety;Standing               Lower Body Dressing: Minimal assistance;Sitting/lateral leans;Sit to/from stand Lower Body Dressing Details (indicate cue type and reason): Patient asking for assistance to don underwear. Tried to have patient explain how she does it at friends home and was unable to elaborate. States sometimes staff assists. Assist to thread over feet then patient able to pull up over legs/hip in standing. Toilet Transfer: Supervision/safety;Ambulation;Regular Toilet;Grab bars;Cueing for safety Toilet Transfer Details (indicate cue type and reason): Cues to utilize grab bar Toileting- Clothing Manipulation and Hygiene: Supervision/safety;Sitting/lateral lean;Sit to/from stand       Functional mobility during ADLs: Supervision/safety;Cueing for safety;Rolling walker (2 wheels)        Cognition Arousal/Alertness: Awake/alert Behavior During Therapy: WFL for tasks assessed/performed Overall Cognitive Status: Impaired/Different from baseline Area of Impairment: Attention                   Current Attention Level: Focused           General Comments: Very tangential  Pertinent Vitals/ Pain       Pain Assessment Pain Assessment: Faces Faces Pain Scale: No hurt         Frequency  Min 2X/week        Progress Toward Goals  OT Goals(current goals can now be found  in the care plan section)  Progress towards OT goals: Progressing toward goals  Acute Rehab OT Goals Patient Stated Goal: "Get out of here" OT Goal Formulation: With patient Time For Goal Achievement: 10/11/21 Potential to Achieve Goals: Good ADL Goals Pt Will Perform Grooming: with modified independence;standing Pt Will Perform Lower Body Dressing: with supervision;sit to/from stand;sitting/lateral leans Pt Will Transfer to Toilet: with supervision;ambulating;regular height toilet Pt Will Perform Toileting - Clothing Manipulation and hygiene: with supervision;sit to/from stand;sitting/lateral leans  Plan Discharge plan remains appropriate       AM-PAC OT "6 Clicks" Daily Activity     Outcome Measure   Help from another person eating meals?: A Little Help from another person taking care of personal grooming?: A Little Help from another person toileting, which includes using toliet, bedpan, or urinal?: A Little Help from another person bathing (including washing, rinsing, drying)?: A Lot Help from another person to put on and taking off regular upper body clothing?: A Little Help from another person to put on and taking off regular lower body clothing?: A Little 6 Click Score: 17    End of Session Equipment Utilized During Treatment: Rolling walker (2 wheels)  OT Visit Diagnosis: Unsteadiness on feet (R26.81);Other abnormalities of gait and mobility (R26.89);Muscle weakness (generalized) (M62.81)   Activity Tolerance Patient tolerated treatment well   Patient Left in chair;with call bell/phone within reach   Nurse Communication Mobility status        Time: 6644-0347 OT Time Calculation (min): 24 min  Charges: OT General Charges $OT Visit: 1 Visit OT Treatments $Self Care/Home Management : 23-37 mins  Delbert Phenix OT OT pager: [phone]   Rosemary Holms 09/29/2021, 12:03 PM

## 2021-09-29 NOTE — Progress Notes (Signed)
PROGRESS NOTE    Katrina [name]  IOX:[id number] DOB: [birth year] DOA: 09/26/2021 PCP: Donnajean Lopes, MD    Brief Narrative:  Katrina [name] is a 88 y.o. female with past medical history significant for paroxysmal atrial fibrillation on Eliquis, prior history of DVT and PE, hypertension, prior renal stones presented to hospital with nausea and vomiting with subjective fever and urinary symptoms.  In the ED, patient was noted to have obstructive right UPJ stone with hydronephrosis at 8 mm and UTI on urine exam.  WBC was elevated at 17 K, tachycardia with mild grade fever with acute kidney injury with creatinine elevated at 2.2 from 0.8 baseline.  Patient was then admitted hospital for IV fluids, antibiotic and was taken to the operating room by urology.  Patient underwent ureteric stent placement and was stable postprocedure.  Currently awaiting for skilled nursing facility placement.     Assessment and Plan: * Pyohydronephrosis- (present on admission) Status post cystoscopy and right ureteric stent placement by urology.  Patient has been able to void without Foley catheter at this time.  Received IV Zosyn during hospitalization.  Urine culture showing E. coli 40,000 colony.  Patient will be discharged on Augmentin on discharge.Patient may need staged stone removal by urology, will schedule follow-up.   Paroxysmal atrial fibrillation ([place])- (present on admission) Continue digoxin, beta-blocker and Eliquis.  Physical debility At baseline patient uses a walker for ambulation and is from assisted living facility.  At this time physical therapy has recommended skilled nursing facility on discharge.  AKI (acute kidney injury) ([place])- (present on admission) Likely postrenal from obstructive renal stone.  Improving.  Creatinine of 1.3 from 1.5 today.  Urine output adequate.  Encourage oral hydration.  Hypertension- (present on admission) Continue to hold Lasix.  Continue beta-blocker.  Continue to  monitor.  Could resume by tomorrow if continues to improve.  Sleep apnea- (present on admission) She does not wear CPAP.     DVT prophylaxis: apixaban (ELIQUIS) tablet 2.5 mg Start: 09/27/21 1000 Place and maintain sequential compression device Start: 09/26/21 2309 apixaban (ELIQUIS) tablet 2.5 mg    Code Status:     Code Status: Full Code  Disposition: Skilled nursing facility. Status is: Inpatient Remains inpatient appropriate because: Awaiting for skilled nursing facility placement.  Family Communication:  Spoke with the patient's sister-in-law on 09/28/2021   Consultants:  Urology  Procedures:  Cystoscopy with right ureteric stent placement Foley catheter insertion and removal  Antimicrobials:  Zosyn 09/27/21>   Subjective: Today, patient was seen and examined at bedside.  Patient denies any urinary urgency, frequency or dysuria.  Waiting for skilled nursing facility placement.  Objective: Vitals:   09/28/21 1345 09/28/21 2002 09/29/21 0425 09/29/21 1014  BP: (!) 143/67 (!) 179/57 (!) 177/56 (!) 149/61  Pulse: (!) 56 60 69 87  Resp: 16 18 18    Temp: 97.9 F (36.6 C) 98.8 F (37.1 C) 99.2 F (37.3 C)   TempSrc: Oral Oral Oral   SpO2: 99% 97% 95%   Weight:      Height:        Intake/Output Summary (Last 24 hours) at 09/29/2021 1338 Last data filed at 09/28/2021 1700 Gross per 24 hour  Intake 481.82 ml  Output --  Net 481.82 ml   Filed Weights   09/26/21 1948 09/27/21 0140  Weight: 90.7 kg 94.4 kg   Body mass index is 39.32 kg/m.   Physical Examination: General: Obese built, not in obvious distress HENT:   No scleral  pallor or icterus noted. Oral mucosa is moist.  Chest:    Diminished breath sounds bilaterally. No crackles or wheezes.  CVS: S1 &S2 heard. No murmur.  Regular rate and rhythm. Abdomen: Soft, nontender, nondistended.  Bowel sounds are heard.   Extremities: No cyanosis, clubbing with trace edema.  Peripheral pulses are palpable. Psych:  Alert, awake and oriented, normal mood CNS:  No cranial nerve deficits.  Power equal in all extremities.   Skin: Warm and dry.  No rashes noted.  Data Reviewed:   CBC: Recent Labs  Lab 09/26/21 2036 09/27/21 0353 09/28/21 0356  WBC 17.4* 11.2* 11.7*  NEUTROABS  --   --  10.5*  HGB 12.9 10.9* 10.3*  HCT 40.3 34.1* 31.9*  MCV 89.8 90.5 88.4  PLT 134* 100* 112*    Basic Metabolic Panel: Recent Labs  Lab 09/26/21 2036 09/27/21 0353 09/28/21 0356 09/29/21 0427  NA 133* 134* 135 136  K 4.3 4.0 3.7 3.7  CL 100 101 104 105  CO2 22 23 24 24   GLUCOSE 214* 208* 174* 139*  BUN 33* 35* 39* 41*  CREATININE 2.23* 2.00* 1.55* 1.32*  CALCIUM 8.7* 8.2* 8.4* 8.3*  MG  --   --  2.0  --   PHOS  --   --  3.1  --     GFR: Estimated Creatinine Clearance: 31.5 mL/min (A) (by C-G formula based on SCr of 1.32 mg/dL (H)).  Liver Function Tests: Recent Labs  Lab 09/26/21 2036  AST 43*  ALT 17  ALKPHOS 100  BILITOT 1.4*  PROT 8.6*  ALBUMIN 3.5    CBG: No results for input(s): GLUCAP in the last 168 hours.   Recent Results (from the past 240 hour(s))  Resp Panel by RT-PCR (Flu A&B, Covid) Nasopharyngeal Swab     Status: None   Collection Time: 09/26/21  9:29 PM   Specimen: Nasopharyngeal Swab; Nasopharyngeal(NP) swabs in vial transport medium  Result Value Ref Range Status   SARS Coronavirus 2 by RT PCR NEGATIVE NEGATIVE Final    Comment: (NOTE) SARS-CoV-2 target nucleic acids are NOT DETECTED.  The SARS-CoV-2 RNA is generally detectable in upper respiratory specimens during the acute phase of infection. The lowest concentration of SARS-CoV-2 viral copies this assay can detect is 138 copies/mL. A negative result does not preclude SARS-Cov-2 infection and should not be used as the sole basis for treatment or other patient management decisions. A negative result may occur with  improper specimen collection/handling, submission of specimen other than nasopharyngeal swab,  presence of viral mutation(s) within the areas targeted by this assay, and inadequate number of viral copies(<138 copies/mL). A negative result must be combined with clinical observations, patient history, and epidemiological information. The expected result is Negative.  Fact Sheet for Patients:  EntrepreneurPulse.com.au  Fact Sheet for Healthcare Providers:  IncredibleEmployment.be  This test is no t yet approved or cleared by the [place] FDA and  has been authorized for detection and/or diagnosis of SARS-CoV-2 by FDA under an Emergency Use Authorization (EUA). This EUA will remain  in effect (meaning this test can be used) for the duration of the COVID-19 declaration under Section 564(b)(1) of the Act, 21 U.S.C.section 360bbb-3(b)(1), unless the authorization is terminated  or revoked sooner.       Influenza A by PCR NEGATIVE NEGATIVE Final   Influenza B by PCR NEGATIVE NEGATIVE Final    Comment: (NOTE) The Xpert Xpress SARS-CoV-2/FLU/RSV plus assay is intended as an aid in the diagnosis of  influenza from Nasopharyngeal swab specimens and should not be used as a sole basis for treatment. Nasal washings and aspirates are unacceptable for Xpert Xpress SARS-CoV-2/FLU/RSV testing.  Fact Sheet for Patients: EntrepreneurPulse.com.au  Fact Sheet for Healthcare Providers: IncredibleEmployment.be  This test is not yet approved or cleared by the [place] FDA and has been authorized for detection and/or diagnosis of SARS-CoV-2 by FDA under an Emergency Use Authorization (EUA). This EUA will remain in effect (meaning this test can be used) for the duration of the COVID-19 declaration under Section 564(b)(1) of the Act, 21 U.S.C. section 360bbb-3(b)(1), unless the authorization is terminated or revoked.  Performed at [facility], [place] [address] [place], [place] [address]   Urine  Culture     Status: Abnormal   Collection Time: 09/27/21 12:44 AM   Specimen: Urine, Cystoscope  Result Value Ref Range Status   Specimen Description   Final    CYSTOSCOPY URINE, RANDOM Performed at [place] [address], [place], [place] [address]    Special Requests   Final    NONE Performed at [facility], [place] [address] [place], [place] [address]    Culture 40,000 COLONIES/mL ESCHERICHIA COLI (A)  Final   Report Status 09/29/2021 FINAL  Final   Organism ID, Bacteria ESCHERICHIA COLI (A)  Final      Susceptibility   Escherichia coli - MIC*    AMPICILLIN <=2 SENSITIVE Sensitive     CEFAZOLIN <=4 SENSITIVE Sensitive     CEFEPIME <=0.12 SENSITIVE Sensitive     CEFTAZIDIME <=1 SENSITIVE Sensitive     CEFTRIAXONE <=0.25 SENSITIVE Sensitive     CIPROFLOXACIN <=0.25 SENSITIVE Sensitive     GENTAMICIN <=1 SENSITIVE Sensitive     IMIPENEM <=0.25 SENSITIVE Sensitive     TRIMETH/SULFA <=20 SENSITIVE Sensitive     AMPICILLIN/SULBACTAM <=2 SENSITIVE Sensitive     PIP/TAZO <=4 SENSITIVE Sensitive     * 40,000 COLONIES/mL ESCHERICHIA COLI      Radiology Studies: No results found.   Scheduled Meds:  apixaban  2.5 mg Oral BID   Chlorhexidine Gluconate Cloth  6 each Topical Daily   digoxin  0.25 mg Oral Daily   levothyroxine  125 mcg Oral Q0600   mouth rinse  15 mL Mouth Rinse BID   metoprolol succinate  50 mg Oral Daily   prenatal multivitamin  1 tablet Oral Daily   Continuous Infusions:  piperacillin-tazobactam (ZOSYN)  IV 3.375 g (09/29/21 1044)     LOS: 3 days    Flora Lipps, MD [facility] 09/29/2021, 1:38 PM

## 2021-09-29 NOTE — Progress Notes (Signed)
Pt refuses CPAP QHS. 

## 2021-09-30 LAB — BASIC METABOLIC PANEL
Anion gap: 8 (ref 5–15)
BUN: 33 mg/dL — ABNORMAL HIGH (ref 8–23)
CO2: 23 mmol/L (ref 22–32)
Calcium: 8.3 mg/dL — ABNORMAL LOW (ref 8.9–10.3)
Chloride: 103 mmol/L (ref 98–111)
Creatinine, Ser: 1.29 mg/dL — ABNORMAL HIGH (ref 0.44–1.00)
GFR, Estimated: 40 mL/min — ABNORMAL LOW (ref >60)
Glucose, Bld: 178 mg/dL — ABNORMAL HIGH (ref 70–99)
Potassium: 4 mmol/L (ref 3.5–5.1)
Sodium: 134 mmol/L — ABNORMAL LOW (ref 135–145)

## 2021-09-30 MED ORDER — AMOXICILLIN-POT CLAVULANATE 500-125 MG PO TABS
1.0000 | ORAL_TABLET | Freq: Two times a day (BID) | ORAL | Status: DC
Start: 2021-09-30 — End: 2021-09-30
  Filled 2021-09-30: qty 1

## 2021-09-30 NOTE — Discharge Summary (Signed)
Physician Discharge Summary   Patient: Katrina [name] MRN: [id number] DOB: 1932/05/27  Admit date:     09/26/2021  Discharge date: 09/30/21  Discharge Physician: Flora Lipps   PCP: Donnajean Lopes, MD   Recommendations at discharge:   Please follow-up with Dr. Jeffie Pollock urology as outpatient for definitive management of renal stone. Follow-up with your primary care provider at the skilled nursing facility in 3 to 5 days.  Check CBC, BMP in 3 to 5 days.  Discharge Diagnoses: Principal Problem:   Pyohydronephrosis Active Problems:   Sleep apnea   Hypertension   AKI (acute kidney injury) ([place])   Physical debility   Paroxysmal atrial fibrillation ([facility])  Resolved Problems:   * No resolved hospital problems. *   Hospital Course: Katrina [name] is a 88 y.o. female with past medical history significant for paroxysmal atrial fibrillation on Eliquis, prior history of DVT and PE, hypertension, prior renal stones presented to hospital with nausea and vomiting with subjective fever and urinary symptoms.  In the ED, patient was noted to have obstructive right UPJ stone with hydronephrosis at 8 mm and UTI on urine exam.  WBC was elevated at 17 K, tachycardia with mild grade fever with acute kidney injury with creatinine elevated at 2.2 from 0.8 baseline.  Patient was then admitted hospital for IV fluids, antibiotic and was taken to the operating room by urology.  Patient underwent ureteric stent placement and was stable postprocedure.  Currently awaiting for skilled nursing facility placement.  Assessment and Plan: * Pyohydronephrosis- (present on admission) Status post cystoscopy and right ureteric stent placement by urology.  Foley catheter has been discontinued today.  Patient received IV Zosyn in hospitalization which she tolerated well..  Urine cultures showed 40,000 colony of E. Coli. Patient has clinically improved at this time.  Urology is okay about the patient being discharged home.  Patient  may need staged stone removal, urology will schedule follow-up with the patient as outpatient.  Paroxysmal atrial fibrillation ([place])- (present on admission) Continue digoxin, beta-blocker and Eliquis on discharge.Marland Kitchen  AKI (acute kidney injury) ([place])- (present on admission) Likely postrenal from obstructive renal stone.  Slightly improved.  Creatinine of 1.2. today.  Urine output adequate.  Will need follow-up with BMP in 3 to 5 days.  Hypertension- (present on admission) Resume Lasix after discharge.  Sleep apnea- (present on admission) She does not wear CPAP.  Debility weakness.  Patient states that she does walk with help of walker at baseline.   Patient is from assisted living facility and will be discharged to skilled nursing facility at this time.  Consultants: Urology  Procedures performed: Cystoscopy with right retrograde pyelogram and right ureteric stent placement on 09/27/2021 by Dr. Junious Silk, urology.  Disposition: SNF  Diet recommendation:  Discharge Diet Orders (From admission, onward)     Start     Ordered   09/28/21 0000  Diet general        09/28/21 1417           Regular diet  DISCHARGE MEDICATION: Allergies as of 09/30/2021       Reactions   Codeine    Other reaction(s): nausea   Cephalosporins Rash   Dermatitis to keflex in 2012- Has tolerated several courses since that time-  Most recently 2021. Notably patient has stasis dermatitis that developed around the same time.         Medication List     TAKE these medications    amoxicillin-clavulanate 500-125 MG tablet Commonly  known as: Augmentin Take 1 tablet (500 mg total) by mouth 2 (two) times daily for 7 days.   apixaban 2.5 MG Tabs tablet Commonly known as: ELIQUIS Take 1 tablet by mouth 2 (two) times daily.   digoxin 0.125 MG tablet Commonly known as: LANOXIN Take 0.25 mg by mouth daily. Take 2 tablets (0.25 mg) daily   furosemide 20 MG tablet Commonly known as: LASIX Take 20 mg by  mouth daily.   ketoconazole 2 % cream Commonly known as: NIZORAL Apply 1 application topically daily.   levothyroxine 125 MCG tablet Commonly known as: SYNTHROID Take 1 tablet by mouth daily.   metoprolol succinate 50 MG 24 hr tablet Commonly known as: TOPROL-XL Take 50 mg by mouth daily.   potassium chloride 10 MEQ CR capsule Commonly known as: MICRO-K Take 10 mEq by mouth daily.   Prenatal Vitamin Plus Low Iron 27-1 MG Tabs Take 1 tablet by mouth daily.           Follow-up Information     Irine Seal, MD. Call.   Specialty: Urology Contact information: [place] [address] [phone]         Donnajean Lopes, MD Follow up.   Specialty: Internal Medicine Contact information: [address] [place] [address] [phone]                Subjective.  Today, patient was seen and examined at bedside.  Denies any interval complaints.    Discharge Exam: Vitals with BMI 09/30/2021 09/30/2021 09/29/2021  Height - - -  Weight - - -  BMI - - -  Systolic - 885 027  Diastolic - 60 58  Pulse 75 67 74     Body mass index is 39.32 kg/m.   General: Obese built, not in obvious distress HENT:   No scleral pallor or icterus noted. Oral mucosa is moist.  Chest:  Clear breath sounds.  Diminished breath sounds bilaterally. No crackles or wheezes.  CVS: S1 &S2 heard. No murmur.  Regular rate and rhythm. Abdomen: Soft, nontender, nondistended.  Bowel sounds are heard.   Extremities: No cyanosis, clubbing or edema.  Peripheral pulses are palpable. Psych: Alert, awake and oriented, normal mood CNS:  No cranial nerve deficits.  Power equal in all extremities.   Skin: Warm and dry.  No rashes noted.   Condition at discharge: good  The results of significant diagnostics from this hospitalization (including imaging, microbiology, ancillary and laboratory) are listed below for reference.   Imaging Studies: CT Abdomen Pelvis Wo Contrast  Result  Date: 09/26/2021 CLINICAL DATA:  Abdominal pain, acute, nonlocalized. Vomiting over the last 2 days. EXAM: CT ABDOMEN AND PELVIS WITHOUT CONTRAST TECHNIQUE: Multidetector CT imaging of the abdomen and pelvis was performed following the standard protocol without IV contrast. RADIATION DOSE REDUCTION: This exam was performed according to the departmental dose-optimization program which includes automated exposure control, adjustment of the mA and/or kV according to patient size and/or use of iterative reconstruction technique. COMPARISON:  CT 04/01/2011 FINDINGS: Lower chest: Scarring and bronchiectasis in both lower lobes. There is some dependent atelectasis, right more than left. Small hiatal hernia is noted. Hepatobiliary: Cirrhosis of the liver with relative enlargement of the left lobe and slight lobular surface. Previous cholecystectomy. Focal calcification in the left lobe slightly more prominent than was seen in 2012. Liver cysts in the right and left lobe are smaller than were seen in 2012. Pancreas: Negative Spleen: Mild splenomegaly.  No focal lesion.  Adrenals/Urinary Tract: Adrenal glands are normal. The left kidney is normal. There is hydroureteronephrosis on the right due 2 an 8 mm stone at the UPJ. Retroperitoneal edema consistent with pyelo sinus extravasation. No stone seen distal to that. No stone in the bladder. Stomach/Bowel: Small hiatal hernia as noted above. Small bowel is normal. There is diverticulosis of the left colon but no evidence of diverticulitis. Vascular/Lymphatic: Aortic atherosclerosis. No aneurysm. IVC is normal. No adenopathy. Reproductive: No pelvic mass. Other: No free fluid or air. Musculoskeletal: Chronic spinal degenerative changes. IMPRESSION: Hydroureteronephrosis on the right because of an 8 mm stone at the right UPJ. Retroperitoneal edema consistent with pyelo sinus extravasation. Cirrhosis of the liver, newly demonstrated since the prior study of 2012. Splenomegaly  consistent with portal venous hypertension. Scarring and bronchiectasis in the lower lungs with dependent atelectasis right more than left. Electronically Signed   By: Nelson Chimes M.D.   On: 09/26/2021 22:48   DG Chest Port 1 View  Result Date: 09/26/2021 CLINICAL DATA:  Cough.  Vomiting for 2 days. EXAM: PORTABLE CHEST 1 VIEW COMPARISON:  04/06/2020 FINDINGS: Normal heart size and pulmonary vascularity. No focal airspace disease or consolidation in the lungs. No blunting of costophrenic angles. No pneumothorax. Mediastinal contours appear intact. Calcification of the aorta. Degenerative changes in the spine and shoulders. IMPRESSION: No active disease. Electronically Signed   By: Lucienne Capers M.D.   On: 09/26/2021 21:30   DG C-Arm 1-60 Min-No Report  Result Date: 09/27/2021 Fluoroscopy was utilized by the requesting physician.  No radiographic interpretation.    Microbiology: Results for orders placed or performed during the hospital encounter of 09/26/21  Resp Panel by RT-PCR (Flu A&B, Covid) Nasopharyngeal Swab     Status: None   Collection Time: 09/26/21  9:29 PM   Specimen: Nasopharyngeal Swab; Nasopharyngeal(NP) swabs in vial transport medium  Result Value Ref Range Status   SARS Coronavirus 2 by RT PCR NEGATIVE NEGATIVE Final    Comment: (NOTE) SARS-CoV-2 target nucleic acids are NOT DETECTED.  The SARS-CoV-2 RNA is generally detectable in upper respiratory specimens during the acute phase of infection. The lowest concentration of SARS-CoV-2 viral copies this assay can detect is 138 copies/mL. A negative result does not preclude SARS-Cov-2 infection and should not be used as the sole basis for treatment or other patient management decisions. A negative result may occur with  improper specimen collection/handling, submission of specimen other than nasopharyngeal swab, presence of viral mutation(s) within the areas targeted by this assay, and inadequate number of  viral copies(<138 copies/mL). A negative result must be combined with clinical observations, patient history, and epidemiological information. The expected result is Negative.  Fact Sheet for Patients:  EntrepreneurPulse.com.au  Fact Sheet for Healthcare Providers:  IncredibleEmployment.be  This test is no t yet approved or cleared by the [place] FDA and  has been authorized for detection and/or diagnosis of SARS-CoV-2 by FDA under an Emergency Use Authorization (EUA). This EUA will remain  in effect (meaning this test can be used) for the duration of the COVID-19 declaration under Section 564(b)(1) of the Act, 21 U.S.C.section 360bbb-3(b)(1), unless the authorization is terminated  or revoked sooner.       Influenza A by PCR NEGATIVE NEGATIVE Final   Influenza B by PCR NEGATIVE NEGATIVE Final    Comment: (NOTE) The Xpert Xpress SARS-CoV-2/FLU/RSV plus assay is intended as an aid in the diagnosis of influenza from Nasopharyngeal swab specimens and should not be used as a sole basis  for treatment. Nasal washings and aspirates are unacceptable for Xpert Xpress SARS-CoV-2/FLU/RSV testing.  Fact Sheet for Patients: EntrepreneurPulse.com.au  Fact Sheet for Healthcare Providers: IncredibleEmployment.be  This test is not yet approved or cleared by the [place] FDA and has been authorized for detection and/or diagnosis of SARS-CoV-2 by FDA under an Emergency Use Authorization (EUA). This EUA will remain in effect (meaning this test can be used) for the duration of the COVID-19 declaration under Section 564(b)(1) of the Act, 21 U.S.C. section 360bbb-3(b)(1), unless the authorization is terminated or revoked.  Performed at [facility], [place] [address], [place], [place] [address]   Urine Culture     Status: Abnormal   Collection Time: 09/27/21 12:44 AM   Specimen: Urine,  Cystoscope  Result Value Ref Range Status   Specimen Description   Final    CYSTOSCOPY URINE, RANDOM Performed at [facility] [address], [place], [place] [address]    Special Requests   Final    NONE Performed at [facility], [place] [address] [place], [place] [address]    Culture 40,000 COLONIES/mL ESCHERICHIA COLI (A)  Final   Report Status 09/29/2021 FINAL  Final   Organism ID, Bacteria ESCHERICHIA COLI (A)  Final      Susceptibility   Escherichia coli - MIC*    AMPICILLIN <=2 SENSITIVE Sensitive     CEFAZOLIN <=4 SENSITIVE Sensitive     CEFEPIME <=0.12 SENSITIVE Sensitive     CEFTAZIDIME <=1 SENSITIVE Sensitive     CEFTRIAXONE <=0.25 SENSITIVE Sensitive     CIPROFLOXACIN <=0.25 SENSITIVE Sensitive     GENTAMICIN <=1 SENSITIVE Sensitive     IMIPENEM <=0.25 SENSITIVE Sensitive     TRIMETH/SULFA <=20 SENSITIVE Sensitive     AMPICILLIN/SULBACTAM <=2 SENSITIVE Sensitive     PIP/TAZO <=4 SENSITIVE Sensitive     * 40,000 COLONIES/mL ESCHERICHIA COLI    Labs: CBC: Recent Labs  Lab 09/26/21 2036 09/27/21 0353 09/28/21 0356  WBC 17.4* 11.2* 11.7*  NEUTROABS  --   --  10.5*  HGB 12.9 10.9* 10.3*  HCT 40.3 34.1* 31.9*  MCV 89.8 90.5 88.4  PLT 134* 100* 112*    Basic Metabolic Panel: Recent Labs  Lab 09/26/21 2036 09/27/21 0353 09/28/21 0356 09/29/21 0427 09/30/21 0408  NA 133* 134* 135 136 134*  K 4.3 4.0 3.7 3.7 4.0  CL 100 101 104 105 103  CO2 22 23 24 24 23   GLUCOSE 214* 208* 174* 139* 178*  BUN 33* 35* 39* 41* 33*  CREATININE 2.23* 2.00* 1.55* 1.32* 1.29*  CALCIUM 8.7* 8.2* 8.4* 8.3* 8.3*  MG  --   --  2.0  --   --   PHOS  --   --  3.1  --   --     Liver Function Tests: Recent Labs  Lab 09/26/21 2036  AST 43*  ALT 17  ALKPHOS 100  BILITOT 1.4*  PROT 8.6*  ALBUMIN 3.5    CBG: No results for input(s): GLUCAP in the last 168 hours.  Discharge time spent: greater than 30 minutes.  Signed: Flora Lipps, MD [facility] 09/30/2021

## 2021-09-30 NOTE — TOC Progression Note (Signed)
Transition of Care [facility]) - Progression Note    Patient Details  Name: Katrina [name] MRN: [id number] Date of Birth: [birth year]  [facility]) CM/SW Contact  Purcell Mouton, RN Phone Number: 09/30/2021, 11:35 AM  Clinical Narrative:     Katrina [name] was called pt and RN are aware.   Expected Discharge Plan: Home/Self Care Barriers to Discharge: Continued Medical Work up  Expected Discharge Plan and Services Expected Discharge Plan: Home/Self Care   Discharge Planning Services: CM Consult   Living arrangements for the past 2 months: Apartment Expected Discharge Date: 09/28/21                                     Social Determinants of Health (SDOH) Interventions    Readmission Risk Interventions No flowsheet data found.

## 2021-09-30 NOTE — Progress Notes (Signed)
Pt discharged to [facility].  Belongings returned: shoes.  Pt IV removed before discharge.  Pt transport provided by [facility].

## 2021-09-30 NOTE — TOC Progression Note (Signed)
Transition of Care [facility]) - Progression Note    Patient Details  Name: Katrina [name] MRN: [id number] Date of Birth: 01/13/1932  [facility] CM/SW Contact  Purcell Mouton, RN Phone Number: 09/30/2021, 10:17 AM  Clinical Narrative:    Insurance auth given to SNF. Pt may transport. SNF asked for updated discharge summary to make sure that there are no changes.    Expected Discharge Plan: Home/Self Care Barriers to Discharge: Continued Medical Work up  Expected Discharge Plan and Services Expected Discharge Plan: Home/Self Care   Discharge Planning Services: CM Consult   Living arrangements for the past 2 months: Apartment Expected Discharge Date: 09/28/21                                     Social Determinants of Health (SDOH) Interventions    Readmission Risk Interventions No flowsheet data found.

## 2021-10-02 ENCOUNTER — Encounter: Payer: Self-pay | Admitting: Nurse Practitioner

## 2021-10-02 ENCOUNTER — Non-Acute Institutional Stay (SKILLED_NURSING_FACILITY): Payer: Medicare Other | Admitting: Nurse Practitioner

## 2021-10-02 DIAGNOSIS — I480 Paroxysmal atrial fibrillation: Secondary | ICD-10-CM

## 2021-10-02 DIAGNOSIS — L89152 Pressure ulcer of sacral region, stage 2: Secondary | ICD-10-CM | POA: Diagnosis not present

## 2021-10-02 DIAGNOSIS — D72829 Elevated white blood cell count, unspecified: Secondary | ICD-10-CM | POA: Diagnosis not present

## 2021-10-02 DIAGNOSIS — I8290 Acute embolism and thrombosis of unspecified vein: Secondary | ICD-10-CM

## 2021-10-02 DIAGNOSIS — N179 Acute kidney failure, unspecified: Secondary | ICD-10-CM

## 2021-10-02 DIAGNOSIS — N136 Pyonephrosis: Secondary | ICD-10-CM

## 2021-10-02 DIAGNOSIS — G4733 Obstructive sleep apnea (adult) (pediatric): Secondary | ICD-10-CM

## 2021-10-02 DIAGNOSIS — E031 Congenital hypothyroidism without goiter: Secondary | ICD-10-CM

## 2021-10-02 DIAGNOSIS — K7460 Unspecified cirrhosis of liver: Secondary | ICD-10-CM | POA: Diagnosis not present

## 2021-10-02 NOTE — Progress Notes (Signed)
Location:   [place] Room Number: 48 Place of Service:  SNF (31) Provider:  Lucille Crichlow X, NP  Donnajean Lopes, MD  Patient Care Team: Donnajean Lopes, MD as PCP - General (Internal Medicine)  Extended Emergency Contact Information Primary Emergency Contact: Febo,Sadie  [place] Phone: [phone] Relation: Brother  Code Status:  Full Code Managed Care Goals of care: Advanced Directive information Advanced Directives 09/26/2021  Does Patient Have a Medical Advance Directive? No  Type of Advance Directive -  Would patient like information on creating a medical advance directive? No - Patient declined     Chief Complaint  Patient presents with   Acute Visit    Pressure ulcer coccyx    HPI:  Pt is a 88 y.o. female seen today for an acute visit for coccyx pressure ulcer, stage 2, no s/s of infection. Hx of healed left buttock pressure ucler scar.     Hospitalized 09/26/21-09/30/21 for fever, nausea, vomiting, urinary symptoms, leukocytosis, s/p ureteric stent for obstructive right UPJ stone and hydronephrosis, f/u Urology Dr. Jeffie Pollock, update CBC, BMP at [facility]  AKI, improved on IVF, Bun/creat 33/1.29 09/30/21<<35/2.0 09/27/21  Leukocytosis wbc 11.7 09/28/21  Cirrhosis of the liver, 09/26/21 CT abd newly demonstrated since the prior study of 2012. Splenomegaly consistent with portal venous hypertension. Total bilirubin 09/26/21, AST 43   PAF, takes Eliquis, Digoxin, Metoprolol. Dig level 1.7 09/26/21  Hx of DVT/PE  HTN, takes Furosemide, Metoprolol  OSA, not using CPAP  Hypothyroidism, takes Levothyroxine, no TSH located.  Past Medical History:  Diagnosis Date   Atrial fibrillation ([place])    DVT (deep venous thrombosis) (HCC)    Elevated lipids    Gallstone    Hypertension    Hypothyroidism    Nephrolithiasis    Overactive bladder    Pulmonary emboli (HCC)    while on HRT   SCC (squamous cell carcinoma)    of left leg   Sleep  apnea    on C-pap   Vertigo    Past Surgical History:  Procedure Laterality Date   CHOLECYSTECTOMY     Dr. Zenia Resides   CYSTOSCOPY W/ URETERAL STENT PLACEMENT Right 09/26/2021   Procedure: CYSTOSCOPY WITH RETROGRADE PYELOGRAM/URETERAL STENT PLACEMENT;  Surgeon: Festus Aloe, MD;  Location: [facility];  Service: Urology;  Laterality: Right;   CYSTOSCOPY WITH STENT PLACEMENT  9/12   nephrolithaisis   HYSTEROSCOPY WITH D & C  7/09   PMP with endometrial polyp   REPLACEMENT TOTAL KNEE Bilateral 1998, 2001       TONSILLECTOMY AND ADENOIDECTOMY      Allergies  Allergen Reactions   Codeine     Other reaction(s): nausea   Cephalosporins Rash    Dermatitis to keflex in 2012- Has tolerated several courses since that time-  Most recently 2021. Notably patient has stasis dermatitis that developed around the same time.     Allergies as of 10/02/2021       Reactions   Codeine    Other reaction(s): nausea   Cephalosporins Rash   Dermatitis to keflex in 2012- Has tolerated several courses since that time-  Most recently 2021. Notably patient has stasis dermatitis that developed around the same time.         Medication List        Accurate as of October 02, 2021 11:59 PM. If you have any questions, ask your nurse or doctor.  acetaminophen 325 MG tablet Commonly known as: TYLENOL Take 650 mg by mouth every 6 (six) hours as needed.   amoxicillin-clavulanate 500-125 MG tablet Commonly known as: Augmentin Take 1 tablet (500 mg total) by mouth 2 (two) times daily for 7 days.   apixaban 2.5 MG Tabs tablet Commonly known as: ELIQUIS Take 1 tablet by mouth 2 (two) times daily.   digoxin 0.125 MG tablet Commonly known as: LANOXIN Take 0.25 mg by mouth daily. Take 2 tablets (0.25 mg) daily   furosemide 20 MG tablet Commonly known as: LASIX Take 20 mg by mouth daily.   ketoconazole 2 % cream Commonly known as: NIZORAL Apply 1 application topically daily.   levothyroxine  125 MCG tablet Commonly known as: SYNTHROID Take 1 tablet by mouth daily.   metoprolol succinate 50 MG 24 hr tablet Commonly known as: TOPROL-XL Take 50 mg by mouth daily.   potassium chloride 10 MEQ CR capsule Commonly known as: MICRO-K Take 10 mEq by mouth daily.   Prenatal Vitamin Plus Low Iron 27-1 MG Tabs Take 1 tablet by mouth daily.        Review of Systems  Constitutional:  Negative for activity change, appetite change and fever.  HENT:  Positive for hearing loss. Negative for congestion and trouble swallowing.   Eyes:  Negative for visual disturbance.  Respiratory:  Negative for cough, shortness of breath and wheezing.   Cardiovascular:  Positive for leg swelling. Negative for chest pain and palpitations.  Gastrointestinal:  Negative for abdominal pain, constipation, nausea and vomiting.  Genitourinary:  Negative for dysuria, frequency, hematuria and urgency.  Musculoskeletal:  Positive for arthralgias and gait problem.  Skin:  Positive for wound.  Neurological:  Negative for speech difficulty, weakness and headaches.  Psychiatric/Behavioral:  Negative for behavioral problems and sleep disturbance. The patient is not nervous/anxious.     There is no immunization history on file for this patient. Pertinent  Health Maintenance Due  Topic Date Due   INFLUENZA VACCINE  Never done   DEXA SCAN  Completed   Fall Risk 09/28/2021 09/29/2021 09/29/2021 09/29/2021 09/30/2021  Patient Fall Risk Level _0    Functional Status Survey:    Vitals:   10/02/21 1345  BP: 140/88  Pulse: 95  Resp: 20  Temp: (!) 96.8 F (36 C)  SpO2: 96%  Weight: 207 lb 8 oz (94.1 kg)  Height: _1  (1.549 m)   Body mass index is 39.21 kg/m. Physical Exam Vitals and nursing note reviewed.  Constitutional:      General: She is not in acute distress.    Appearance: Normal appearance. She is obese. She is not ill-appearing.   HENT:     Head: Normocephalic and atraumatic.     Nose: Nose normal.     Mouth/Throat:     Mouth: Mucous membranes are moist.  Eyes:     Extraocular Movements: Extraocular movements intact.     Conjunctiva/sclera: Conjunctivae normal.     Pupils: Pupils are equal, round, and reactive to light.  Cardiovascular:     Rate and Rhythm: Normal rate. Rhythm irregular.     Heart sounds: No murmur heard. Pulmonary:     Effort: Pulmonary effort is normal.     Breath sounds: No wheezing or rales.  Chest:     Chest wall: No tenderness.  Abdominal:     General: Bowel sounds are normal.     Palpations: Abdomen  is soft.     Tenderness: There is no abdominal tenderness. There is no guarding.  Musculoskeletal:     Cervical back: Normal range of motion and neck supple.     Right lower leg: Edema present.     Left lower leg: Edema present.     Comments: Trace edema BLE.   Skin:    General: Skin is warm and dry.     Comments: Coccyx pressure ulcer stage 2, no s/s of infection. Left buttock healed pressure ulcer scar. Pigmented R+L buttock. Chronic venous insufficiency skin changes of BLE  Neurological:     Mental Status: She is alert and oriented to person, place, and time.     Motor: No weakness.     Coordination: Coordination normal.     Gait: Gait abnormal.  Psychiatric:        Mood and Affect: Mood normal.        Behavior: Behavior normal.        Thought Content: Thought content normal.        Judgment: Judgment normal.    Labs reviewed: Recent Labs    09/28/21 0356 09/29/21 0427 09/30/21 0408  NA 135 136 134*  K 3.7 3.7 4.0  CL 104 105 103  CO2 _0 GLUCOSE 174* 139* 178*  BUN 39* 41* 33*  CREATININE 1.55* 1.32* 1.29*  CALCIUM 8.4* 8.3* 8.3*  MG 2.0  --   --   PHOS 3.1  --   --    Recent Labs    09/26/21 2036  AST 43*  ALT 17  ALKPHOS 100  BILITOT 1.4*  PROT 8.6*  ALBUMIN 3.5   Recent Labs    05/10/21 1331 09/26/21 2036 09/27/21 0353 09/28/21 0356   WBC 13.3* 17.4* 11.2* 11.7*  NEUTROABS 11.0*  --   --  10.5*  HGB 10.7* 12.9 10.9* 10.3*  HCT 35.6* 40.3 34.1* 31.9*  MCV 91.0 89.8 90.5 88.4  PLT 297 134* 100* 112*   No results found for: TSH No results found for: HGBA1C No results found for: CHOL, HDL, LDLCALC, LDLDIRECT, TRIG, CHOLHDL  Significant Diagnostic Results in last 30 days:  CT Abdomen Pelvis Wo Contrast  Result Date: 09/26/2021 CLINICAL DATA:  Abdominal pain, acute, nonlocalized. Vomiting over the last 2 days. EXAM: CT ABDOMEN AND PELVIS WITHOUT CONTRAST TECHNIQUE: Multidetector CT imaging of the abdomen and pelvis was performed following the standard protocol without IV contrast. RADIATION DOSE REDUCTION: This exam was performed according to the departmental dose-optimization program which includes automated exposure control, adjustment of the mA and/or kV according to patient size and/or use of iterative reconstruction technique. COMPARISON:  CT 04/01/2011 FINDINGS: Lower chest: Scarring and bronchiectasis in both lower lobes. There is some dependent atelectasis, right more than left. Small hiatal hernia is noted. Hepatobiliary: Cirrhosis of the liver with relative enlargement of the left lobe and slight lobular surface. Previous cholecystectomy. Focal calcification in the left lobe slightly more prominent than was seen in 2012. Liver cysts in the right and left lobe are smaller than were seen in 2012. Pancreas: Negative Spleen: Mild splenomegaly.  No focal lesion. Adrenals/Urinary Tract: Adrenal glands are normal. The left kidney is normal. There is hydroureteronephrosis on the right due 2 an 8 mm stone at the UPJ. Retroperitoneal edema consistent with pyelo sinus extravasation. No stone seen distal to that. No stone in the bladder. Stomach/Bowel: Small hiatal hernia as noted above. Small bowel is normal. There is diverticulosis of the left colon but  no evidence of diverticulitis. Vascular/Lymphatic: Aortic atherosclerosis. No  aneurysm. IVC is normal. No adenopathy. Reproductive: No pelvic mass. Other: No free fluid or air. Musculoskeletal: Chronic spinal degenerative changes. IMPRESSION: Hydroureteronephrosis on the right because of an 8 mm stone at the right UPJ. Retroperitoneal edema consistent with pyelo sinus extravasation. Cirrhosis of the liver, newly demonstrated since the prior study of 2012. Splenomegaly consistent with portal venous hypertension. Scarring and bronchiectasis in the lower lungs with dependent atelectasis right more than left. Electronically Signed   By: Nelson Chimes M.D.   On: 09/26/2021 22:48   DG Chest Port 1 View  Result Date: 09/26/2021 CLINICAL DATA:  Cough.  Vomiting for 2 days. EXAM: PORTABLE CHEST 1 VIEW COMPARISON:  04/06/2020 FINDINGS: Normal heart size and pulmonary vascularity. No focal airspace disease or consolidation in the lungs. No blunting of costophrenic angles. No pneumothorax. Mediastinal contours appear intact. Calcification of the aorta. Degenerative changes in the spine and shoulders. IMPRESSION: No active disease. Electronically Signed   By: Lucienne Capers M.D.   On: 09/26/2021 21:30   DG C-Arm 1-60 Min-No Report  Result Date: 09/27/2021 Fluoroscopy was utilized by the requesting physician.  No radiographic interpretation.    Assessment/Plan Decubitus ulcer of coccyx, stage 2 (HCC) Apply hydrocolloid dressing, encourage frequent repositioning and air flow mattress to avoid excessive pressure to the region.  Left buttock healed pressure ulcer scar  Paroxysmal atrial fibrillation (HCC) Heart rate is in control, takes Eliquis, Digoxin, Metoprolol. Dig level 1.7 09/26/21  Cirrhosis of liver ([place]) Cirrhosis of the liver, 09/26/21 CT abd newly demonstrated since the prior study of 2012. Splenomegaly consistent with portal venous hypertension. Total bilirubin 09/26/21, AST 43, f/u LFT  Leukocytosis Trended down, resolved urinary symptoms, update CBC  Hypothyroidism No recent  TSH located, continue Levothyroxine, update TSH  Sleep apnea Not using CPAP  Thromboembolism of vein Hx of DVT/PE  AKI (acute kidney injury) ([place])  improved on IVF, Bun/creat 33/1.29 09/30/21<<35/2.0 09/27/21, update BMP  Pyohydronephrosis s/p ureteric stent for obstructive right UPJ stone and hydronephrosis, f/u Urology Dr. Jeffie Pollock, update CBC, BMP at [facility]    Family/ staff Communication: plan of care reviewed with the patient and charge nurse.   Labs/tests ordered: CBC/diff, CMP/eGFR, TSH  Time spend 35 minutes.

## 2021-10-02 NOTE — Assessment & Plan Note (Addendum)
Apply hydrocolloid dressing, encourage frequent repositioning and air flow mattress to avoid excessive pressure to the region.  Left buttock healed pressure ulcer scar

## 2021-10-04 ENCOUNTER — Encounter: Payer: Self-pay | Admitting: Nurse Practitioner

## 2021-10-04 DIAGNOSIS — D72829 Elevated white blood cell count, unspecified: Secondary | ICD-10-CM | POA: Insufficient documentation

## 2021-10-04 DIAGNOSIS — K7460 Unspecified cirrhosis of liver: Secondary | ICD-10-CM | POA: Insufficient documentation

## 2021-10-04 NOTE — Assessment & Plan Note (Signed)
Not using CPAP.  

## 2021-10-04 NOTE — Assessment & Plan Note (Signed)
Heart rate is in control, takes Eliquis, Digoxin, Metoprolol. Dig level 1.7 09/26/21

## 2021-10-04 NOTE — Assessment & Plan Note (Addendum)
Cirrhosis of the liver, 09/26/21 CT abd newly demonstrated since the prior study of 2012. Splenomegaly consistent with portal venous hypertension. Total bilirubin 09/26/21, AST 43, f/u LFT

## 2021-10-04 NOTE — Assessment & Plan Note (Signed)
Trended down, resolved urinary symptoms, update CBC

## 2021-10-04 NOTE — Assessment & Plan Note (Signed)
improved on IVF, Bun/creat 33/1.29 09/30/21<<35/2.0 09/27/21, update BMP

## 2021-10-04 NOTE — Assessment & Plan Note (Signed)
No recent TSH located, continue Levothyroxine, update TSH

## 2021-10-04 NOTE — Assessment & Plan Note (Signed)
Hx of DVT/PE

## 2021-10-04 NOTE — Assessment & Plan Note (Signed)
s/p ureteric stent for obstructive right UPJ stone and hydronephrosis, f/u Urology Dr. Jeffie Pollock, update CBC, BMP at [facility]

## 2021-10-06 ENCOUNTER — Encounter: Payer: Self-pay | Admitting: Internal Medicine

## 2021-10-06 ENCOUNTER — Non-Acute Institutional Stay (SKILLED_NURSING_FACILITY): Payer: Medicare Other | Admitting: Internal Medicine

## 2021-10-06 DIAGNOSIS — G4733 Obstructive sleep apnea (adult) (pediatric): Secondary | ICD-10-CM

## 2021-10-06 DIAGNOSIS — K7460 Unspecified cirrhosis of liver: Secondary | ICD-10-CM

## 2021-10-06 DIAGNOSIS — E039 Hypothyroidism, unspecified: Secondary | ICD-10-CM

## 2021-10-06 DIAGNOSIS — I480 Paroxysmal atrial fibrillation: Secondary | ICD-10-CM

## 2021-10-06 DIAGNOSIS — N136 Pyonephrosis: Secondary | ICD-10-CM

## 2021-10-06 LAB — BASIC METABOLIC PANEL
BUN: 18 (ref 4–21)
CO2: 26 — AB (ref 13–22)
Chloride: 104 (ref 99–108)
Creatinine: 1.1 (ref 0.5–1.1)
Glucose: 102
Potassium: 4.3 (ref 3.4–5.3)
Sodium: 137 (ref 137–147)

## 2021-10-06 LAB — COMPREHENSIVE METABOLIC PANEL
Albumin: 3.3 — AB (ref 3.5–5.0)
Calcium: 8.4 — AB (ref 8.7–10.7)
Globulin: 4.8

## 2021-10-06 LAB — CBC AND DIFFERENTIAL
HCT: 34 — AB (ref 36–46)
Hemoglobin: 11.3 — AB (ref 12.0–16.0)
Neutrophils Absolute: 7200
Platelets: 289 (ref 150–399)
WBC: 9.6

## 2021-10-06 LAB — HEPATIC FUNCTION PANEL
ALT: 12 (ref 7–35)
AST: 17 (ref 13–35)
Alkaline Phosphatase: 94 (ref 25–125)
Bilirubin, Total: 1.1

## 2021-10-06 LAB — TSH: TSH: 3.65 (ref 0.41–5.90)

## 2021-10-06 LAB — CBC: RBC: 3.96 (ref 3.87–5.11)

## 2021-10-06 NOTE — Progress Notes (Signed)
Provider:  Veleta Miners MD Location:   [place] Room Number: 31 Place of Service:  SNF (31)  PCP: Donnajean Lopes, MD Patient Care Team: Donnajean Lopes, MD as PCP - General (Internal Medicine)  Extended Emergency Contact Information Primary Emergency Contact: Baquero,Sadie  [place] Phone: [phone] Relation: Brother  Code Status: Full Code Managed Care Goals of Care: Advanced Directive information Advanced Directives 09/26/2021  Does Patient Have a Medical Advance Directive? No  Type of Advance Directive -  Would patient like information on creating a medical advance directive? No - Patient declined      Chief Complaint  Patient presents with   New Admit To SNF    Admission to SNF    HPI: Patient is a 88 y.o. female seen today for admission to SNF for therapy  Admitted in the hospital from 09/4200/8 for pyohydronephrosis due to renal stone  Patient has a history of PAF on Eliquis, history of DVT and PE, history of renal stones, of hypertension She went to ED with nausea and vomiting. Further work-up showed patient had hydroureteronephrosis on the right due to 8 mm stone at the right UPJ. Also was found to have UTI.  Underwent urgent stent on 02/5 Plan for outpatient urethroscopy/[facility] Patient received IV Zosyn.  Urine culture just had 40,000 colonies of E. coli. She also had acute renal insufficiency which improved with hydration  Patient was discharged to SNF for therapy. Patient did not have any acute complaints today.  She was very emotional about the renal stone.  She did have 1 episode of hematuria yesterday Is able to walk with therapy and her walker But is not able to do her transfers..  No abdominal pain nausea fever  Past Medical History:  Diagnosis Date   Atrial fibrillation ([place])    DVT (deep venous thrombosis) (HCC)    Elevated lipids    Gallstone    Hypertension    Hypothyroidism     Nephrolithiasis    Overactive bladder    Pulmonary emboli (HCC)    while on HRT   SCC (squamous cell carcinoma)    of left leg   Sleep apnea    on C-pap   Vertigo    Past Surgical History:  Procedure Laterality Date   CHOLECYSTECTOMY     Dr. Zenia Resides   CYSTOSCOPY W/ URETERAL STENT PLACEMENT Right 09/26/2021   Procedure: CYSTOSCOPY WITH RETROGRADE PYELOGRAM/URETERAL STENT PLACEMENT;  Surgeon: Festus Aloe, MD;  Location: [facility];  Service: Urology;  Laterality: Right;   CYSTOSCOPY WITH STENT PLACEMENT  9/12   nephrolithaisis   HYSTEROSCOPY WITH D & C  7/09   PMP with endometrial polyp   REPLACEMENT TOTAL KNEE Bilateral 1998, 2001       TONSILLECTOMY AND ADENOIDECTOMY      reports that she has never smoked. She has never used smokeless tobacco. She reports that she does not drink alcohol and does not use drugs. Social History   Socioeconomic History   Marital status: Single    Spouse name: Not on file   Number of children: Not on file   Years of education: Not on file   Highest education level: Not on file  Occupational History   Not on file  Tobacco Use   Smoking status: Never   Smokeless tobacco: Never  Vaping Use   Vaping Use: Never used  Substance and Sexual Activity   Alcohol use: No    Alcohol/week: 0.0 standard  drinks   Drug use: No   Sexual activity: Not Currently    Partners: Male    Birth control/protection: Post-menopausal  Other Topics Concern   Not on file  Social History Narrative   Not on file   Social Determinants of Health   Financial Resource Strain: Not on file  Food Insecurity: Not on file  Transportation Needs: Not on file  Physical Activity: Not on file  Stress: Not on file  Social Connections: Not on file  Intimate Partner Violence: Not on file    Functional Status Survey:    Family History  Problem Relation Age of Onset   Hypertension Father    Heart disease Father    Hypertension Mother    Hyperlipidemia Mother    Heart  attack Mother    Prostate cancer Brother     Health Maintenance  Topic Date Due   COVID-19 Vaccine (1) Never done   TETANUS/TDAP  Never done   Zoster Vaccines- Shingrix (1 of 2) Never done   Pneumonia Vaccine 32+ Years old (1 - PCV) Never done   INFLUENZA VACCINE  Never done   DEXA SCAN  Completed   HPV VACCINES  Aged Out    Allergies  Allergen Reactions   Codeine     Other reaction(s): nausea   Cephalosporins Rash    Dermatitis to keflex in 2012- Has tolerated several courses since that time-  Most recently 2021. Notably patient has stasis dermatitis that developed around the same time.     Allergies as of 10/06/2021       Reactions   Codeine    Other reaction(s): nausea   Cephalosporins Rash   Dermatitis to keflex in 2012- Has tolerated several courses since that time-  Most recently 2021. Notably patient has stasis dermatitis that developed around the same time.         Medication List        Accurate as of October 06, 2021  9:53 AM. If you have any questions, ask your nurse or doctor.          acetaminophen 325 MG tablet Commonly known as: TYLENOL Take 650 mg by mouth every 6 (six) hours as needed.   amoxicillin-clavulanate 500-125 MG tablet Commonly known as: AUGMENTIN Take 1 tablet by mouth 2 (two) times daily.   apixaban 2.5 MG Tabs tablet Commonly known as: ELIQUIS Take 1 tablet by mouth 2 (two) times daily.   digoxin 0.125 MG tablet Commonly known as: LANOXIN Take 0.25 mg by mouth daily. Take 2 tablets (0.25 mg) daily   furosemide 20 MG tablet Commonly known as: LASIX Take 20 mg by mouth daily.   ketoconazole 2 % cream Commonly known as: NIZORAL Apply 1 application topically daily.   levothyroxine 125 MCG tablet Commonly known as: SYNTHROID Take 1 tablet by mouth daily.   metoprolol succinate 50 MG 24 hr tablet Commonly known as: TOPROL-XL Take 50 mg by mouth daily.   potassium chloride 10 MEQ CR capsule Commonly known as:  MICRO-K Take 10 mEq by mouth daily.   Prenatal Vitamin Plus Low Iron 27-1 MG Tabs Take 1 tablet by mouth daily.        Review of Systems  Constitutional:  Negative for activity change and appetite change.  HENT: Negative.    Respiratory:  Negative for cough and shortness of breath.   Cardiovascular:  Negative for leg swelling.  Gastrointestinal:  Negative for constipation.  Genitourinary:  Positive for hematuria.  Musculoskeletal:  Negative for  arthralgias, gait problem and myalgias.  Skin: Negative.   Neurological:  Negative for dizziness and weakness.  Psychiatric/Behavioral:  Positive for dysphoric mood. Negative for confusion and sleep disturbance. The patient is nervous/anxious.    Vitals:   10/06/21 0945  BP: (!) 152/68  Pulse: 64  Temp: (!) 97.3 F (36.3 C)  SpO2: 97%  Weight: 207 lb 8 oz (94.1 kg)  Height: 5\' 1"  (1.549 m)   Body mass index is 39.21 kg/m. Physical Exam Vitals reviewed.  Constitutional:      Appearance: Normal appearance.  HENT:     Head: Normocephalic.     Nose: Nose normal.     Mouth/Throat:     Mouth: Mucous membranes are moist.     Pharynx: Oropharynx is clear.  Eyes:     Pupils: Pupils are equal, round, and reactive to light.  Cardiovascular:     Rate and Rhythm: Normal rate and regular rhythm.     Pulses: Normal pulses.     Heart sounds: Normal heart sounds. No murmur heard. Pulmonary:     Effort: Pulmonary effort is normal.     Breath sounds: Normal breath sounds.  Abdominal:     General: Abdomen is flat. Bowel sounds are normal.     Palpations: Abdomen is soft.  Musculoskeletal:        General: No swelling.     Cervical back: Neck supple.  Skin:    General: Skin is warm.  Neurological:     General: No focal deficit present.     Mental Status: She is alert and oriented to person, place, and time.  Psychiatric:        Mood and Affect: Mood normal.        Thought Content: Thought content normal.    Labs reviewed: Basic  Metabolic Panel: Recent Labs    09/28/21 0356 09/29/21 0427 09/30/21 0408  NA 135 136 134*  K 3.7 3.7 4.0  CL 104 105 103  CO2 24 24 23   GLUCOSE 174* 139* 178*  BUN 39* 41* 33*  CREATININE 1.55* 1.32* 1.29*  CALCIUM 8.4* 8.3* 8.3*  MG 2.0  --   --   PHOS 3.1  --   --    Liver Function Tests: Recent Labs    09/26/21 2036  AST 43*  ALT 17  ALKPHOS 100  BILITOT 1.4*  PROT 8.6*  ALBUMIN 3.5   Recent Labs    09/26/21 2036  LIPASE 19   No results for input(s): AMMONIA in the last 8760 hours. CBC: Recent Labs    05/10/21 1331 09/26/21 2036 09/27/21 0353 09/28/21 0356  WBC 13.3* 17.4* 11.2* 11.7*  NEUTROABS 11.0*  --   --  10.5*  HGB 10.7* 12.9 10.9* 10.3*  HCT 35.6* 40.3 34.1* 31.9*  MCV 91.0 89.8 90.5 88.4  PLT 297 134* 100* 112*   Cardiac Enzymes: Recent Labs    05/10/21 1331  CKTOTAL 242*   BNP: Invalid input(s): POCBNP No results found for: HGBA1C No results found for: TSH No results found for: VITAMINB12 No results found for: FOLATE No results found for: IRON, TIBC, FERRITIN  Imaging and Procedures obtained prior to SNF admission: CT Abdomen Pelvis Wo Contrast  Result Date: 09/26/2021 CLINICAL DATA:  Abdominal pain, acute, nonlocalized. Vomiting over the last 2 days. EXAM: CT ABDOMEN AND PELVIS WITHOUT CONTRAST TECHNIQUE: Multidetector CT imaging of the abdomen and pelvis was performed following the standard protocol without IV contrast. RADIATION DOSE REDUCTION: This exam was performed according  to the departmental dose-optimization program which includes automated exposure control, adjustment of the mA and/or kV according to patient size and/or use of iterative reconstruction technique. COMPARISON:  CT 04/01/2011 FINDINGS: Lower chest: Scarring and bronchiectasis in both lower lobes. There is some dependent atelectasis, right more than left. Small hiatal hernia is noted. Hepatobiliary: Cirrhosis of the liver with relative enlargement of the left lobe  and slight lobular surface. Previous cholecystectomy. Focal calcification in the left lobe slightly more prominent than was seen in 2012. Liver cysts in the right and left lobe are smaller than were seen in 2012. Pancreas: Negative Spleen: Mild splenomegaly.  No focal lesion. Adrenals/Urinary Tract: Adrenal glands are normal. The left kidney is normal. There is hydroureteronephrosis on the right due 2 an 8 mm stone at the UPJ. Retroperitoneal edema consistent with pyelo sinus extravasation. No stone seen distal to that. No stone in the bladder. Stomach/Bowel: Small hiatal hernia as noted above. Small bowel is normal. There is diverticulosis of the left colon but no evidence of diverticulitis. Vascular/Lymphatic: Aortic atherosclerosis. No aneurysm. IVC is normal. No adenopathy. Reproductive: No pelvic mass. Other: No free fluid or air. Musculoskeletal: Chronic spinal degenerative changes. IMPRESSION: Hydroureteronephrosis on the right because of an 8 mm stone at the right UPJ. Retroperitoneal edema consistent with pyelo sinus extravasation. Cirrhosis of the liver, newly demonstrated since the prior study of 2012. Splenomegaly consistent with portal venous hypertension. Scarring and bronchiectasis in the lower lungs with dependent atelectasis right more than left. Electronically Signed   By: Nelson Chimes M.D.   On: 09/26/2021 22:48   DG Chest Port 1 View  Result Date: 09/26/2021 CLINICAL DATA:  Cough.  Vomiting for 2 days. EXAM: PORTABLE CHEST 1 VIEW COMPARISON:  04/06/2020 FINDINGS: Normal heart size and pulmonary vascularity. No focal airspace disease or consolidation in the lungs. No blunting of costophrenic angles. No pneumothorax. Mediastinal contours appear intact. Calcification of the aorta. Degenerative changes in the spine and shoulders. IMPRESSION: No active disease. Electronically Signed   By: Lucienne Capers M.D.   On: 09/26/2021 21:30   DG C-Arm 1-60 Min-No Report  Result Date:  09/27/2021 Fluoroscopy was utilized by the requesting physician.  No radiographic interpretation.    Assessment/Plan 1. Pyohydronephrosis with renal stones Discharged on Augmentin Today is last day Follow up with Urology Not having any issue  2. Paroxysmal atrial fibrillation (HCC) On Eliquis,Toprol and Digoxin  3. Hepatic cirrhosis, unspecified hepatic cirrhosis type, unspecified whether ascites present [facility]) New Finding Will need GI follow up as Outpatient  4. Hypothyroidism TSH pending  5 Acute Renal Injury Repeat Labs Creat did improve after hydration in the hospital 5. Obstructive sleep apnea syndrome Not using her CPAP right now    Family/ staff Communication:   Labs/tests ordered:

## 2021-10-08 ENCOUNTER — Other Ambulatory Visit: Payer: Self-pay | Admitting: Urology

## 2021-10-12 NOTE — Progress Notes (Signed)
COVID swab appointment:  N/A  COVID Vaccine Completed: Date COVID Vaccine completed: Has received booster: COVID vaccine manufacturer: [place]   Date of COVID positive in last 90 days:  PCP - Katrina Battles, MD Cardiologist -   Chest x-ray - 09-26-21 Epic EKG -  Stress Test -  ECHO -  Cardiac Cath -  Pacemaker/ICD device last checked: Spinal Cord Stimulator:  Bowel Prep -   Sleep Study -  Yes, +sleep apnea CPAP -   Fasting Blood Sugar -  Checks Blood Sugar _____ times a day  Blood Thinner Instructions: Eliquis Aspirin Instructions: Last Dose:  Activity level:  Can go up a flight of stairs and perform activities of daily living without stopping and without symptoms of chest pain or shortness of breath.   Able to exercise without symptoms  Unable to go up a flight of stairs without symptoms of      Anesthesia review:  Afib, SVT, HTN, OSA.  Hepatic cirrhosis  Per nursing home note, Stage 2 pressure ulcer coccyx  Patient denies shortness of breath, fever, cough and chest pain at PAT appointment   Patient verbalized understanding of instructions that were given to them at the PAT appointment. Patient was also instructed that they will need to review over the PAT instructions again at home before surgery.

## 2021-10-13 ENCOUNTER — Encounter: Payer: Self-pay | Admitting: Internal Medicine

## 2021-10-13 ENCOUNTER — Non-Acute Institutional Stay (SKILLED_NURSING_FACILITY): Payer: Medicare Other | Admitting: Internal Medicine

## 2021-10-13 DIAGNOSIS — I480 Paroxysmal atrial fibrillation: Secondary | ICD-10-CM

## 2021-10-13 DIAGNOSIS — K7460 Unspecified cirrhosis of liver: Secondary | ICD-10-CM | POA: Diagnosis not present

## 2021-10-13 DIAGNOSIS — N136 Pyonephrosis: Secondary | ICD-10-CM | POA: Diagnosis not present

## 2021-10-13 DIAGNOSIS — D649 Anemia, unspecified: Secondary | ICD-10-CM

## 2021-10-13 DIAGNOSIS — N1831 Chronic kidney disease, stage 3a: Secondary | ICD-10-CM

## 2021-10-13 DIAGNOSIS — E039 Hypothyroidism, unspecified: Secondary | ICD-10-CM

## 2021-10-13 DIAGNOSIS — G4733 Obstructive sleep apnea (adult) (pediatric): Secondary | ICD-10-CM

## 2021-10-13 NOTE — Progress Notes (Signed)
Location:  [facility] Room Number: 5 Place of Service:  SNF [phone])  Provider: Veleta Miners MD  PCP: Donnajean Lopes, MD Patient Care Team: Donnajean Lopes, MD as PCP - General (Internal Medicine)  Extended Emergency Contact Information Primary Emergency Contact: Amano,Sadie  [place] Phone: [phone] Relation: Brother  Code Status: Full Code Managed Care Goals of care:  Advanced Directive information Advanced Directives 09/26/2021  Does Patient Have a Medical Advance Directive? No  Type of Advance Directive -  Would patient like information on creating a medical advance directive? No - Patient declined     Allergies  Allergen Reactions   Codeine     Other reaction(s): nausea   Cephalosporins Rash    Dermatitis to keflex in 2012- Has tolerated several courses since that time-  Most recently 2021. Notably patient has stasis dermatitis that developed around the same time.     Chief Complaint  Patient presents with   Discharge Note    Discharge from SNF    HPI:  88 y.o. female  Seen today for discharge to [facility]   Admitted in the hospital from 09/4200/8 for pyohydronephrosis due to renal stone   Patient has a history of PAF on Eliquis, history of DVT and PE, history of renal stones, of hypertension She went to ED with nausea and vomiting. Further work-up showed patient had hydroureteronephrosis on the right due to 8 mm stone at the right UPJ. Also was found to have UTI.  Underwent urgent stent on 02/5 Plan for outpatient urethroscopy/[facility] Patient received IV Zosyn.  Urine culture just had 40,000 colonies of E. coli. She also had acute renal insufficiency which improved with hydration  Patient was admitted in SNF for therapy Has done well and Now be discharged to [facility] She is doing well walking with her walker Doing her transfers Eating well No Pain or fever    Past Medical History:  Diagnosis Date   Atrial  fibrillation ([place])    DVT (deep venous thrombosis) (HCC)    Elevated lipids    Gallstone    Hypertension    Hypothyroidism    Nephrolithiasis    Overactive bladder    Pulmonary emboli (HCC)    while on HRT   SCC (squamous cell carcinoma)    of left leg   Sleep apnea    on C-pap   Vertigo     Past Surgical History:  Procedure Laterality Date   CHOLECYSTECTOMY     Dr. Zenia Resides   CYSTOSCOPY W/ URETERAL STENT PLACEMENT Right 09/26/2021   Procedure: CYSTOSCOPY WITH RETROGRADE PYELOGRAM/URETERAL STENT PLACEMENT;  Surgeon: Festus Aloe, MD;  Location: [facility];  Service: Urology;  Laterality: Right;   CYSTOSCOPY WITH STENT PLACEMENT  9/12   nephrolithaisis   HYSTEROSCOPY WITH D & C  7/09   PMP with endometrial polyp   REPLACEMENT TOTAL KNEE Bilateral 1998, 2001       TONSILLECTOMY AND ADENOIDECTOMY        reports that she has never smoked. She has never used smokeless tobacco. She reports that she does not drink alcohol and does not use drugs. Social History   Socioeconomic History   Marital status: Single    Spouse name: Not on file   Number of children: Not on file   Years of education: Not on file   Highest education level: Not on file  Occupational History   Not on file  Tobacco Use   Smoking status: Never  Smokeless tobacco: Never  Vaping Use   Vaping Use: Never used  Substance and Sexual Activity   Alcohol use: No    Alcohol/week: 0.0 standard drinks   Drug use: No   Sexual activity: Not Currently    Partners: Male    Birth control/protection: Post-menopausal  Other Topics Concern   Not on file  Social History Narrative   Not on file   Social Determinants of Health   Financial Resource Strain: Not on file  Food Insecurity: Not on file  Transportation Needs: Not on file  Physical Activity: Not on file  Stress: Not on file  Social Connections: Not on file  Intimate Partner Violence: Not on file   Functional Status Survey:    Allergies  Allergen  Reactions   Codeine     Other reaction(s): nausea   Cephalosporins Rash    Dermatitis to keflex in 2012- Has tolerated several courses since that time-  Most recently 2021. Notably patient has stasis dermatitis that developed around the same time.     Pertinent  Health Maintenance Due  Topic Date Due   INFLUENZA VACCINE  Never done   DEXA SCAN  Completed    Medications: Outpatient Encounter Medications as of 10/13/2021  Medication Sig   acetaminophen (TYLENOL) 325 MG tablet Take 650 mg by mouth every 6 (six) hours as needed.   Amino Acids-Protein Hydrolys (PRO-STAT) LIQD Take by mouth in the morning and at bedtime.   apixaban (ELIQUIS) 2.5 MG TABS tablet Take 1 tablet by mouth 2 (two) times daily.   digoxin (LANOXIN) 0.125 MG tablet Take 0.25 mg by mouth daily. Take 2 tablets (0.25 mg) daily   furosemide (LASIX) 20 MG tablet Take 20 mg by mouth daily.   ketoconazole (NIZORAL) 2 % cream Apply 1 application topically daily.   levothyroxine (SYNTHROID) 125 MCG tablet Take 1 tablet by mouth daily.   metoprolol succinate (TOPROL-XL) 50 MG 24 hr tablet Take 50 mg by mouth daily.   potassium chloride (MICRO-K) 10 MEQ CR capsule Take 10 mEq by mouth daily.   Prenatal Vit-Fe Fumarate-FA (PRENATAL VITAMIN PLUS LOW IRON) 27-1 MG TABS Take 1 tablet by mouth daily.   No facility-administered encounter medications on file as of 10/13/2021.    Review of Systems  Vitals:   10/13/21 1324  BP: 134/70  Pulse: 85  Resp: 18  Temp: (!) 97 F (36.1 C)  SpO2: 94%  Weight: 207 lb 8 oz (94.1 kg)  Height: 5\' 1"  (1.549 m)   Body mass index is 39.21 kg/m. Physical Exam Vitals reviewed.  Constitutional:      Appearance: Normal appearance.  HENT:     Head: Normocephalic.     Nose: Nose normal.     Mouth/Throat:     Mouth: Mucous membranes are moist.     Pharynx: Oropharynx is clear.  Eyes:     Pupils: Pupils are equal, round, and reactive to light.  Cardiovascular:     Rate and Rhythm:  Normal rate and regular rhythm.     Pulses: Normal pulses.     Heart sounds: Normal heart sounds. No murmur heard. Pulmonary:     Effort: Pulmonary effort is normal.     Breath sounds: Normal breath sounds.  Abdominal:     General: Abdomen is flat. Bowel sounds are normal.     Palpations: Abdomen is soft.  Musculoskeletal:        General: No swelling.     Cervical back: Neck supple.  Skin:  General: Skin is warm.  Neurological:     General: No focal deficit present.     Mental Status: She is alert and oriented to person, place, and time.  Psychiatric:        Mood and Affect: Mood normal.        Thought Content: Thought content normal.    Labs reviewed: Basic Metabolic Panel: Recent Labs    09/28/21 0356 09/29/21 0427 09/30/21 0408 10/06/21 0000  NA 135 136 134* 137  K 3.7 3.7 4.0 4.3  CL 104 105 103 104  CO2 24 24 23  26*  GLUCOSE 174* 139* 178*  --   BUN 39* 41* 33* 18  CREATININE 1.55* 1.32* 1.29* 1.1  CALCIUM 8.4* 8.3* 8.3* 8.4*  MG 2.0  --   --   --   PHOS 3.1  --   --   --    Liver Function Tests: Recent Labs    09/26/21 2036 10/06/21 0000  AST 43* 17  ALT 17 12  ALKPHOS 100 94  BILITOT 1.4*  --   PROT 8.6*  --   ALBUMIN 3.5 3.3*   Recent Labs    09/26/21 2036  LIPASE 19   No results for input(s): AMMONIA in the last 8760 hours. CBC: Recent Labs    05/10/21 1331 09/26/21 2036 09/27/21 0353 09/28/21 0356 10/06/21 0000  WBC 13.3* 17.4* 11.2* 11.7* 9.6  NEUTROABS 11.0*  --   --  10.5* 7,200.00  HGB 10.7* 12.9 10.9* 10.3* 11.3*  HCT 35.6* 40.3 34.1* 31.9* 34*  MCV 91.0 89.8 90.5 88.4  --   PLT 297 134* 100* 112* 289   Cardiac Enzymes: Recent Labs    05/10/21 1331  CKTOTAL 242*   BNP: Invalid input(s): POCBNP CBG: No results for input(s): GLUCAP in the last 8760 hours.  Procedures and Imaging Studies During Stay: CT Abdomen Pelvis Wo Contrast  Result Date: 09/26/2021 CLINICAL DATA:  Abdominal pain, acute, nonlocalized. Vomiting  over the last 2 days. EXAM: CT ABDOMEN AND PELVIS WITHOUT CONTRAST TECHNIQUE: Multidetector CT imaging of the abdomen and pelvis was performed following the standard protocol without IV contrast. RADIATION DOSE REDUCTION: This exam was performed according to the departmental dose-optimization program which includes automated exposure control, adjustment of the mA and/or kV according to patient size and/or use of iterative reconstruction technique. COMPARISON:  CT 04/01/2011 FINDINGS: Lower chest: Scarring and bronchiectasis in both lower lobes. There is some dependent atelectasis, right more than left. Small hiatal hernia is noted. Hepatobiliary: Cirrhosis of the liver with relative enlargement of the left lobe and slight lobular surface. Previous cholecystectomy. Focal calcification in the left lobe slightly more prominent than was seen in 2012. Liver cysts in the right and left lobe are smaller than were seen in 2012. Pancreas: Negative Spleen: Mild splenomegaly.  No focal lesion. Adrenals/Urinary Tract: Adrenal glands are normal. The left kidney is normal. There is hydroureteronephrosis on the right due 2 an 8 mm stone at the UPJ. Retroperitoneal edema consistent with pyelo sinus extravasation. No stone seen distal to that. No stone in the bladder. Stomach/Bowel: Small hiatal hernia as noted above. Small bowel is normal. There is diverticulosis of the left colon but no evidence of diverticulitis. Vascular/Lymphatic: Aortic atherosclerosis. No aneurysm. IVC is normal. No adenopathy. Reproductive: No pelvic mass. Other: No free fluid or air. Musculoskeletal: Chronic spinal degenerative changes. IMPRESSION: Hydroureteronephrosis on the right because of an 8 mm stone at the right UPJ. Retroperitoneal edema consistent with pyelo sinus extravasation.  Cirrhosis of the liver, newly demonstrated since the prior study of 2012. Splenomegaly consistent with portal venous hypertension. Scarring and bronchiectasis in the lower  lungs with dependent atelectasis right more than left. Electronically Signed   By: Nelson Chimes M.D.   On: 09/26/2021 22:48   DG Chest Port 1 View  Result Date: 09/26/2021 CLINICAL DATA:  Cough.  Vomiting for 2 days. EXAM: PORTABLE CHEST 1 VIEW COMPARISON:  04/06/2020 FINDINGS: Normal heart size and pulmonary vascularity. No focal airspace disease or consolidation in the lungs. No blunting of costophrenic angles. No pneumothorax. Mediastinal contours appear intact. Calcification of the aorta. Degenerative changes in the spine and shoulders. IMPRESSION: No active disease. Electronically Signed   By: Lucienne Capers M.D.   On: 09/26/2021 21:30   DG C-Arm 1-60 Min-No Report  Result Date: 09/27/2021 Fluoroscopy was utilized by the requesting physician.  No radiographic interpretation.    Assessment/Plan:   1. Pyohydronephrosis Doing well Plan for Repeat Cystoscopy next week by Dr Jeffie Pollock   2. Paroxysmal atrial fibrillation (HCC) On Eliquis, Toprol and Digoxin  3. Hepatic cirrhosis, unspecified hepatic cirrhosis type, unspecified whether ascites present [employer]) New Diagnosis by Imaging Will need follow up with her PCP  4. Hypothyroidism, unspecified type TSH checked here was normal  5. Obstructive sleep apnea syndrome   6. Anemia, unspecified type Hgb stable  7. Stage 3a chronic kidney disease ([place]) Creat Back o th er baseline    Patient is being discharged with the following home health services:    Patient is being discharged with the following durable medical equipment:  walker  Patient has been advised to f/u with their PCP in 1-2 weeks to for a transitions of care visit.    Future labs/tests needed:

## 2021-10-14 NOTE — Progress Notes (Addendum)
COVID swab appointment:  N/A    PCP - Dr. Veleta Miners Cardiologist -    Chest x-ray - 09-26-21 Epic EKG - greater than 1 year Stress Test - N/A ECHO - N/A Cardiac Cath - N/A Pacemaker/ICD device last checked:N/A Spinal Cord Stimulator:N/A   Bowel Prep - N/A   Sleep Study -  Yes, +sleep apnea CPAP -    Fasting Blood Sugar - N/A Checks Blood Sugar ___N/A__ times a day   Blood Thinner Instructions: Eliquis last dose 10/19/21 at 7pm Aspirin Instructions:N/A Last Dose:N/A   Activity level:    perform activities of daily living without stopping and without symptoms of chest pain or shortness of breath.                                                    Anesthesia review:  Afib, SVT, HTN, OSA.  Hepatic cirrhosis   Per nursing home note, Stage 2 pressure ulcer coccyx   Patient denies shortness of breath, fever, cough and chest pain at PAT appointment     Patient verbalized understanding of instructions that were given to them at the PAT appointment. Patient was also instructed that they will need to review over the PAT instructions again at home before surgery.

## 2021-10-14 NOTE — Patient Instructions (Addendum)
Preop instructions for:  Katrina [name]     Date of Birth:   December 06, 1931                    Date of Procedure:   10/20/2021 Procedure:   CYSTOSCOPY RIGHT URETEROSCOPY/HOLMIUM LASER/STENT EXCHANGE   Surgeon: Dr. Irine Seal Facility contact:  [facility]   Phone:    [phone] ext [place]: RN contact name/phone#:  Shirlean Mylar (Nurse )                      and Fax #: [phone]   Transportation contact phone#: [facility]    Time to arrive at [facility]: 6:30AM   Report to: Admitting (On your left hand side)    Do not eat or drink past midnight the night before your procedure.(To include any tube feedings-must be discontinued)   Take these morning medications only with sips of water.(or give through gastrostomy or feeding tube).   Digoxin Levothyroxine Metoprolol Tylenol if needed   Note: No Insulin or Diabetic meds should be given or taken the morning of the procedure!   Please send day of procedure:current med list and meds last taken that day, confirm nothing by mouth status from what time, Patient Demographic info( to include DNR status, problem list, allergies)   Bring Insurance card and picture ID Leave all jewelry and other valuables at place where living( no metal or rings to be worn) No contact lens Women-no make-up, no lotions,perfumes,powders    Any questions day of procedure,call  [facility]-[phone]     Sent from :[facility]                   Phone:[phone]                   [phone]   Sent by :    Harlon Flor BSN           RN

## 2021-10-20 ENCOUNTER — Ambulatory Visit (HOSPITAL_COMMUNITY)
Admission: RE | Admit: 2021-10-20 | Discharge: 2021-10-20 | Disposition: A | Discharge status: Home / Self Care | Payer: Medicare Other | Attending: Urology | Admitting: Urology

## 2021-10-20 ENCOUNTER — Ambulatory Visit (HOSPITAL_COMMUNITY): Payer: Medicare Other | Admitting: Physician Assistant

## 2021-10-20 ENCOUNTER — Encounter (HOSPITAL_COMMUNITY)
Admission: RE | Disposition: A | Discharge status: Home / Self Care | Payer: Self-pay | Source: Home / Self Care | Attending: Urology

## 2021-10-20 ENCOUNTER — Ambulatory Visit (HOSPITAL_COMMUNITY): Payer: Medicare Other

## 2021-10-20 ENCOUNTER — Encounter (HOSPITAL_COMMUNITY): Payer: Self-pay | Admitting: Urology

## 2021-10-20 ENCOUNTER — Ambulatory Visit (HOSPITAL_BASED_OUTPATIENT_CLINIC_OR_DEPARTMENT_OTHER): Payer: Medicare Other | Admitting: Physician Assistant

## 2021-10-20 DIAGNOSIS — Z6839 Body mass index (BMI) 39.0-39.9, adult: Secondary | ICD-10-CM | POA: Diagnosis not present

## 2021-10-20 DIAGNOSIS — Z79899 Other long term (current) drug therapy: Secondary | ICD-10-CM | POA: Insufficient documentation

## 2021-10-20 DIAGNOSIS — N201 Calculus of ureter: Secondary | ICD-10-CM | POA: Diagnosis not present

## 2021-10-20 DIAGNOSIS — I10 Essential (primary) hypertension: Secondary | ICD-10-CM | POA: Diagnosis not present

## 2021-10-20 DIAGNOSIS — K769 Liver disease, unspecified: Secondary | ICD-10-CM

## 2021-10-20 DIAGNOSIS — Z466 Encounter for fitting and adjustment of urinary device: Secondary | ICD-10-CM

## 2021-10-20 DIAGNOSIS — E039 Hypothyroidism, unspecified: Secondary | ICD-10-CM | POA: Diagnosis not present

## 2021-10-20 DIAGNOSIS — G4730 Sleep apnea, unspecified: Secondary | ICD-10-CM | POA: Insufficient documentation

## 2021-10-20 HISTORY — PX: CYSTOSCOPY/URETEROSCOPY/HOLMIUM LASER/STENT PLACEMENT: SHX6546

## 2021-10-20 LAB — COMPREHENSIVE METABOLIC PANEL
ALT: 15 U/L (ref 0–44)
AST: 38 U/L (ref 15–41)
Albumin: 3.4 g/dL — ABNORMAL LOW (ref 3.5–5.0)
Alkaline Phosphatase: 105 U/L (ref 38–126)
Anion gap: 7 (ref 5–15)
BUN: 31 mg/dL — ABNORMAL HIGH (ref 8–23)
CO2: 24 mmol/L (ref 22–32)
Calcium: 9.1 mg/dL (ref 8.9–10.3)
Chloride: 104 mmol/L (ref 98–111)
Creatinine, Ser: 1.17 mg/dL — ABNORMAL HIGH (ref 0.44–1.00)
GFR, Estimated: 45 mL/min — ABNORMAL LOW (ref >60)
Glucose, Bld: 135 mg/dL — ABNORMAL HIGH (ref 70–99)
Potassium: 4.9 mmol/L (ref 3.5–5.1)
Sodium: 135 mmol/L (ref 135–145)
Total Bilirubin: 1.7 mg/dL — ABNORMAL HIGH (ref 0.3–1.2)
Total Protein: 9.4 g/dL — ABNORMAL HIGH (ref 6.5–8.1)

## 2021-10-20 SURGERY — CYSTOSCOPY/URETEROSCOPY/HOLMIUM LASER/STENT PLACEMENT
Anesthesia: General | Laterality: Right

## 2021-10-20 MED ORDER — 0.9 % SODIUM CHLORIDE (POUR BTL) OPTIME
TOPICAL | Status: DC | PRN
  Administered 2021-10-20: 1000 mL

## 2021-10-20 MED ORDER — ORAL CARE MOUTH RINSE: 15.0000 mL | Freq: Once | OROMUCOSAL | Status: AC

## 2021-10-20 MED ORDER — ACETAMINOPHEN 325 MG PO TABS: 650.0000 mg | ORAL_TABLET | ORAL | Status: DC | PRN

## 2021-10-20 MED ORDER — FENTANYL CITRATE (PF) 100 MCG/2ML IJ SOLN
INTRAMUSCULAR | Status: DC | PRN
  Administered 2021-10-20 (×3): 25 ug via INTRAVENOUS

## 2021-10-20 MED ORDER — SODIUM CHLORIDE 0.9 % IV SOLN: 250.0000 mL | INTRAVENOUS | Status: DC | PRN

## 2021-10-20 MED ORDER — FENTANYL CITRATE (PF) 100 MCG/2ML IJ SOLN
INTRAMUSCULAR | Status: AC
  Filled 2021-10-20: qty 2

## 2021-10-20 MED ORDER — ACETAMINOPHEN 500 MG PO TABS
1000.0000 mg | ORAL_TABLET | Freq: Once | ORAL | Status: AC
  Administered 2021-10-20: 1000 mg via ORAL
  Filled 2021-10-20: qty 2

## 2021-10-20 MED ORDER — ONDANSETRON HCL 4 MG/2ML IJ SOLN
INTRAMUSCULAR | Status: DC | PRN
  Administered 2021-10-20: 4 mg via INTRAVENOUS

## 2021-10-20 MED ORDER — OXYCODONE HCL 5 MG PO TABS: 5.0000 mg | ORAL_TABLET | ORAL | Status: DC | PRN

## 2021-10-20 MED ORDER — ACETAMINOPHEN 650 MG RE SUPP
650.0000 mg | RECTAL | Status: DC | PRN
  Filled 2021-10-20: qty 1

## 2021-10-20 MED ORDER — SODIUM CHLORIDE 0.9% FLUSH: 3.0000 mL | Freq: Two times a day (BID) | INTRAVENOUS | Status: DC

## 2021-10-20 MED ORDER — PROPOFOL 10 MG/ML IV BOLUS
INTRAVENOUS | Status: DC | PRN
  Administered 2021-10-20: 100 mg via INTRAVENOUS

## 2021-10-20 MED ORDER — FENTANYL CITRATE PF 50 MCG/ML IJ SOSY: 25.0000 ug | PREFILLED_SYRINGE | INTRAMUSCULAR | Status: DC | PRN

## 2021-10-20 MED ORDER — PIPERACILLIN-TAZOBACTAM 3.375 G IVPB
3.3750 g | INTRAVENOUS | Status: AC
  Administered 2021-10-20: 3.375 g via INTRAVENOUS
  Filled 2021-10-20: qty 50

## 2021-10-20 MED ORDER — LIDOCAINE 2% (20 MG/ML) 5 ML SYRINGE
INTRAMUSCULAR | Status: DC | PRN
  Administered 2021-10-20: 60 mg via INTRAVENOUS

## 2021-10-20 MED ORDER — CHLORHEXIDINE GLUCONATE 0.12 % MT SOLN
15.0000 mL | Freq: Once | OROMUCOSAL | Status: AC
  Administered 2021-10-20: 15 mL via OROMUCOSAL

## 2021-10-20 MED ORDER — ONDANSETRON HCL 4 MG/2ML IJ SOLN
INTRAMUSCULAR | Status: AC
  Filled 2021-10-20: qty 2

## 2021-10-20 MED ORDER — PROPOFOL 10 MG/ML IV BOLUS
INTRAVENOUS | Status: AC
  Filled 2021-10-20: qty 20

## 2021-10-20 MED ORDER — LIDOCAINE HCL (PF) 2 % IJ SOLN
INTRAMUSCULAR | Status: AC
  Filled 2021-10-20: qty 5

## 2021-10-20 MED ORDER — LACTATED RINGERS IV SOLN: INTRAVENOUS | Status: DC

## 2021-10-20 MED ORDER — SODIUM CHLORIDE 0.9 % IR SOLN
Status: DC | PRN
  Administered 2021-10-20: 3000 mL via INTRAVESICAL

## 2021-10-20 MED ORDER — METOPROLOL SUCCINATE ER 50 MG PO TB24
50.0000 mg | ORAL_TABLET | Freq: Once | ORAL | Status: AC
  Administered 2021-10-20: 50 mg via ORAL
  Filled 2021-10-20: qty 1

## 2021-10-20 MED ORDER — SODIUM CHLORIDE 0.9% FLUSH: 3.0000 mL | INTRAVENOUS | Status: DC | PRN

## 2021-10-20 SURGICAL SUPPLY — 25 items
BAG URO CATCHER STRL LF (MISCELLANEOUS) ×3 IMPLANT
BASKET STONE NCOMPASS (UROLOGICAL SUPPLIES) IMPLANT
BASKET ZERO TIP NITINOL 2.4FR (BASKET) ×1 IMPLANT
BSKT STON RTRVL ZERO TP 2.4FR (BASKET) ×1
CATH URETERAL DUAL LUMEN 10F (MISCELLANEOUS) IMPLANT
CATH URETL OPEN 5X70 (CATHETERS) IMPLANT
CLOTH BEACON ORANGE TIMEOUT ST (SAFETY) ×3 IMPLANT
EXTRACTOR STONE 1.7FRX115CM (UROLOGICAL SUPPLIES) ×1 IMPLANT
EXTRACTOR STONE NITINOL NGAGE (UROLOGICAL SUPPLIES) ×2 IMPLANT
GLOVE SURG POLYISO LF SZ8 (GLOVE) ×3 IMPLANT
GOWN STRL REUS W/TWL XL LVL3 (GOWN DISPOSABLE) ×3 IMPLANT
GUIDEWIRE STR DUAL SENSOR (WIRE) ×3 IMPLANT
IV NS IRRIG 3000ML ARTHROMATIC (IV SOLUTION) ×3 IMPLANT
KIT TURNOVER KIT A (KITS) IMPLANT
LASER FIB FLEXIVA PULSE ID 365 (Laser) IMPLANT
LASER FIB FLEXIVA PULSE ID 550 (Laser) IMPLANT
LASER FIB FLEXIVA PULSE ID 910 (Laser) IMPLANT
MANIFOLD NEPTUNE II (INSTRUMENTS) ×3 IMPLANT
PACK CYSTO (CUSTOM PROCEDURE TRAY) ×3 IMPLANT
SHEATH NAVIGATOR HD 11/13X36 (SHEATH) ×1 IMPLANT
STENT URET 6FRX24 CONTOUR (STENTS) ×1 IMPLANT
TRACTIP FLEXIVA PULS ID 200XHI (Laser) IMPLANT
TRACTIP FLEXIVA PULSE ID 200 (Laser) ×1 IMPLANT
TUBING CONNECTING 10 (TUBING) ×3 IMPLANT
TUBING UROLOGY SET (TUBING) ×3 IMPLANT

## 2021-10-20 NOTE — Anesthesia Postprocedure Evaluation (Signed)
Anesthesia Post Note  Patient: Katrina [name]  Procedure(s) Performed: CYSTOSCOPY RIGHT URETEROSCOPY/HOLMIUM LASER/STENT EXCHANGE (Right)     Patient location during evaluation: PACU Anesthesia Type: General Level of consciousness: awake and alert Pain management: pain level controlled Vital Signs Assessment: post-procedure vital signs reviewed and stable Respiratory status: spontaneous breathing, nonlabored ventilation and respiratory function stable Cardiovascular status: blood pressure returned to baseline and stable Postop Assessment: no apparent nausea or vomiting Anesthetic complications: no   No notable events documented.  Last Vitals:  Vitals:   10/20/21 1145 10/20/21 1200  BP: (!) 165/73 (!) 158/60  Pulse: 72 68  Resp: 15 12  Temp:  36.4 C  SpO2: 100% 97%    Last Pain:  Vitals:   10/20/21 1200  TempSrc:   PainSc: 0-No pain                 Ankush Gintz,W. EDMOND

## 2021-10-20 NOTE — Progress Notes (Signed)
Spoke with Pt's medical staff at [facility] to update med list

## 2021-10-20 NOTE — Interval H&P Note (Signed)
History and Physical Interval Note:  Katrina [name] returns for definitive stone management.  She will be given Zosyn preop.   10/20/2021 8:50 AM  [name]  has presented today for surgery, with the diagnosis of RIGHT PROXIMAL STONE.  The various methods of treatment have been discussed with the patient and family. After consideration of risks, benefits and other options for treatment, the patient has consented to  Procedure(s): CYSTOSCOPY RIGHT URETEROSCOPY/HOLMIUM LASER/STENT EXCHANGE (Right) as a surgical intervention.  The patient's history has been reviewed, patient examined, no change in status, stable for surgery.  I have reviewed the patient's chart and labs.  Questions were answered to the patient's satisfaction.     Irine Seal

## 2021-10-20 NOTE — Anesthesia Preprocedure Evaluation (Addendum)
Anesthesia Evaluation  Patient identified by MRN, date of birth, ID band Patient awake    Reviewed: Allergy & Precautions, H&P , NPO status , Patient's Chart, lab work & pertinent test results, reviewed documented beta blocker date and time   Airway Mallampati: III  TM Distance: >3 FB Neck ROM: Full    Dental no notable dental hx. (+) Teeth Intact, Dental Advisory Given   Pulmonary sleep apnea and Continuous Positive Airway Pressure Ventilation ,    Pulmonary exam normal breath sounds clear to auscultation       Cardiovascular hypertension, Pt. on medications and Pt. on home beta blockers  Rhythm:Regular Rate:Normal     Neuro/Psych Depression negative neurological ROS     GI/Hepatic negative GI ROS, Neg liver ROS,   Endo/Other  Hypothyroidism Morbid obesity  Renal/GU Renal disease  negative genitourinary   Musculoskeletal   Abdominal   Peds  Hematology  (+) Blood dyscrasia, anemia ,   Anesthesia Other Findings   Reproductive/Obstetrics negative OB ROS                            Anesthesia Physical Anesthesia Plan  ASA: 3  Anesthesia Plan: General   Post-op Pain Management: Tylenol PO (pre-op)*   Induction: Intravenous  PONV Risk Score and Plan: 4 or greater and Ondansetron, Dexamethasone and Treatment may vary due to age or medical condition  Airway Management Planned: LMA  Additional Equipment:   Intra-op Plan:   Post-operative Plan: Extubation in OR  Informed Consent: I have reviewed the patients History and Physical, chart, labs and discussed the procedure including the risks, benefits and alternatives for the proposed anesthesia with the patient or authorized representative who has indicated his/her understanding and acceptance.     Dental advisory given  Plan Discussed with: CRNA  Anesthesia Plan Comments:         Anesthesia Quick Evaluation

## 2021-10-20 NOTE — Transfer of Care (Signed)
Immediate Anesthesia Transfer of Care Note  Patient: Katrina [name]  Procedure(s) Performed: CYSTOSCOPY RIGHT URETEROSCOPY/HOLMIUM LASER/STENT EXCHANGE (Right)  Patient Location: PACU  Anesthesia Type:General  Level of Consciousness: awake, alert  and oriented  Airway & Oxygen Therapy: Patient Spontanous Breathing and Patient connected to face mask oxygen  Post-op Assessment: Report given to RN and Post -op Vital signs reviewed and stable  Post vital signs: Reviewed and stable  Last Vitals:  Vitals Value Taken Time  BP 155/57 10/20/21 1124  Temp 36.6 C 10/20/21 1125  Pulse 70 10/20/21 1130  Resp 11 10/20/21 1130  SpO2 100 % 10/20/21 1130  Vitals shown include unvalidated device data.  Last Pain:  Vitals:   10/20/21 0830  TempSrc: Oral  PainSc: 0-No pain         Complications: No notable events documented.

## 2021-10-20 NOTE — Anesthesia Procedure Notes (Addendum)
Procedure Name: LMA Insertion Date/Time: 10/20/2021 10:32 AM Performed by: Sharlette Dense, CRNA Pre-anesthesia Checklist: Patient identified, Emergency Drugs available, Suction available and Patient being monitored Patient Re-evaluated:Patient Re-evaluated prior to induction Oxygen Delivery Method: Circle system utilized Preoxygenation: Pre-oxygenation with 100% oxygen LMA: LMA with gastric port inserted LMA Size: 4.0 Number of attempts: 1 Placement Confirmation: positive ETCO2 and breath sounds checked- equal and bilateral Tube secured with: Tape Dental Injury: Injury to lip

## 2021-10-20 NOTE — Progress Notes (Signed)
Spoke with Loraine Leriche, [place] at [facility] after attempting to get report from [facility] medical provider for Pt rm [phone].  Reported to [facility] [phone]) that pt was given her am dose of metoprolol XL 50mg  at 0920 in short stay and he confirms that Pt would have a care provider to take care of her 24 hours post anesthesia.

## 2021-10-20 NOTE — Op Note (Signed)
Procedure: 1.  Cystoscopy with right ureteroscopy with holmium laser application, stone extraction and double-J stent exchange. 2.  Application of fluoroscopy.  Preop diagnosis: 8 mm right proximal ureteral stone with history of infection.  Postop diagnosis: Same.  Surgeon: Dr. Irine Seal.  Anesthesia: General.  Specimen: Stone fragments and urine culture.  Drain: 6 [place] by 24 cm right contour double-J stent with tether.  EBL: None.  Complications: None.  Indications: The patient is an 88 year old female with a history of an 8 mm right proximal ureteral stone with obstruction and recent infection who was undergone stenting earlier this month.  She is to undergo definitive stone treatment at this time.  Procedure: She was given IV Zosyn preoperatively.  She was taken operating room where general anesthetic was induced.  She was placed in lithotomy position and fitted with PAS hose.  Her perineum and genitalia were prepped with Betadine solution and she was draped in usual sterile fashion.  Her perineum and genitalia were prepped with Betadine solution and she was draped in usual sterile fashion.  The 21 French cystoscope was passed using the 30 degree lens.  Examination revealed a normal urethra.  The left ureteral orifice was unremarkable.  The right ureteral orifice at the stent in place with some meatal edema.  There was mild erythema of the bladder wall with a few small clots in the base the bladder.  Urine was collected to send for culture.  The stent was then grasped with grasping forceps and pulled the urethral meatus.  A sensor wire was advanced the kidney under fluoroscopic guidance and the stent was removed.  An 11/13 French 35 cm digital access sheath was then advanced over the wire to just below the stone.  Inner core and wire were then removed.  The dual-lumen digital scope was then passed through the sheath and the stone was identified back in the renal pelvis.  The stone  was then fragmented with the 200 m tract tip laser fiber using the lithotripsy setting on the [facility] laser.  The bulk of the fragmentation was used with the low power left paddle settings.  Once the stone was adequately fragmented the fragments were removed with a combination of engage and 0 tip nitinol baskets.  Once all significant fragments were removed and final inspection of fluoroscopy and endoscopy revealed no significant residual stone, a sensor wire was passed through the scope and the scope was backed out while visually inspecting the ureter.  No significant injury was identified.  The ureteroscope and sheath were removed and the cystoscope was reinserted over the wire.  A 6 French by 24 cm contour double-J stent was passed to the right kidney under fluoroscopic guidance.  The wire was removed, leaving a good coil in the kidney and a good coil in the bladder.  The bladder was drained and the cystoscope was removed, leaving the stent string exiting the urethra.  The string was knotted close to the meatus and trimmed to an appropriate length before being tucked vaginally.  She was taken down from lithotomy position, her anesthetic was reversed and she was moved recovery in stable condition.  There were no complications.  Stone fragments will be brought to the office for analysis.

## 2021-10-20 NOTE — Discharge Instructions (Signed)
The stent is attached to a small black suture that is tucked vaginally.  The stent can be removed on Friday morning by pulling the attached string.   I will let the office know you need to come have that done.

## 2021-10-20 NOTE — Progress Notes (Signed)
Pt was to arrive for scheduled surgery at 0630. Transportation to be provided through [facility]. Pt did not arrive at [facility]. Call made to [facility] whom was unaware of providing transportation then was instructed that the pts surgery was cancelled yesterday. LVVM for nurse of pts floor to return call back for further explanation.

## 2021-10-21 ENCOUNTER — Encounter (HOSPITAL_COMMUNITY): Payer: Self-pay | Admitting: Urology

## 2021-10-21 LAB — URINE CULTURE: Culture: NO GROWTH

## 2021-12-28 ENCOUNTER — Encounter (HOSPITAL_BASED_OUTPATIENT_CLINIC_OR_DEPARTMENT_OTHER): Payer: Medicare Other | Attending: General Surgery | Admitting: General Surgery

## 2021-12-28 DIAGNOSIS — E785 Hyperlipidemia, unspecified: Secondary | ICD-10-CM | POA: Diagnosis not present

## 2021-12-28 DIAGNOSIS — I480 Paroxysmal atrial fibrillation: Secondary | ICD-10-CM | POA: Insufficient documentation

## 2021-12-28 DIAGNOSIS — G4730 Sleep apnea, unspecified: Secondary | ICD-10-CM | POA: Diagnosis not present

## 2021-12-28 DIAGNOSIS — I872 Venous insufficiency (chronic) (peripheral): Secondary | ICD-10-CM | POA: Diagnosis not present

## 2021-12-28 DIAGNOSIS — Z87442 Personal history of urinary calculi: Secondary | ICD-10-CM | POA: Insufficient documentation

## 2021-12-28 DIAGNOSIS — I10 Essential (primary) hypertension: Secondary | ICD-10-CM | POA: Diagnosis not present

## 2021-12-28 DIAGNOSIS — Z96659 Presence of unspecified artificial knee joint: Secondary | ICD-10-CM | POA: Insufficient documentation

## 2021-12-28 DIAGNOSIS — Z86711 Personal history of pulmonary embolism: Secondary | ICD-10-CM | POA: Diagnosis not present

## 2021-12-28 DIAGNOSIS — L89322 Pressure ulcer of left buttock, stage 2: Secondary | ICD-10-CM | POA: Diagnosis present

## 2021-12-28 DIAGNOSIS — E039 Hypothyroidism, unspecified: Secondary | ICD-10-CM | POA: Diagnosis not present

## 2021-12-28 DIAGNOSIS — Z9049 Acquired absence of other specified parts of digestive tract: Secondary | ICD-10-CM | POA: Insufficient documentation

## 2021-12-28 DIAGNOSIS — D509 Iron deficiency anemia, unspecified: Secondary | ICD-10-CM | POA: Diagnosis not present

## 2021-12-28 NOTE — Progress Notes (Signed)
Handlin, Darryll Capers D. ([id number]) ?Visit Report for 12/28/2021 ?Allergy List Details ?Patient Name: Date of Service: ?Katrina [name], Katrina D. 12/28/2021 9:00 A M ?Medical Record Number: [id number] ?Patient Account Number: [id number] ?Date of Birth/Sex: Treating RN: ?[birth year] (88 y.o. Female) Levan Hurst ?Primary Care Giannina Bartolome: [name] Other Clinician: ?Referring Loana Salvaggio: ?Treating Kaston Faughn/Extender: Fredirick Maudlin ?[name] ?Weeks in Treatment: 0 ?Allergies ?Active Allergies ?penicillin ?Cephalosporins ?codeine ?Allergy Notes ?Electronic Signature(s) ?Signed: 12/28/2021 5:39:57 PM By: Levan Hurst RN, BSN ?Entered By: Levan Hurst on 12/28/2021 09:15:42 ?-------------------------------------------------------------------------------- ?Arrival Information Details ?Patient Name: Date of Service: ?Katrina [name], Katrina D. 12/28/2021 9:00 A M ?Medical Record Number: [id number] ?Patient Account Number: [id number] ?Date of Birth/Sex: Treating RN: ?[birth year] (88 y.o. Female) Levan Hurst ?Primary Care Carrie Usery: [name] Other Clinician: ?Referring Rupa Lagan: ?Treating Aditri Louischarles/Extender: Fredirick Maudlin ?[name] ?Weeks in Treatment: 0 ?Visit Information ?Patient Arrived: Gilford Rile ?Arrival Time: 09:12 ?Accompanied By: alone ?Transfer Assistance: None ?Patient Identification Verified: Yes ?Secondary Verification Process Completed: Yes ?Patient Requires Transmission-Based Precautions: No ?Patient Has Alerts: No ?History Since Last Visit ?Added or deleted any medications: No ?Any new allergies or adverse reactions: No ?Had a fall or experienced change in activities of daily living that may affect risk of falls: No ?Signs or symptoms of abuse/neglect since last visito No ?Hospitalized since last visit: No ?Implantable device outside of the clinic excluding cellular tissue based products placed in the center since last visit: No ?Pain Present Now: No ?Electronic Signature(s) ?Signed: 12/28/2021 5:39:57 PM By: Levan Hurst  RN, BSN ?Entered By: Levan Hurst on 12/28/2021 09:12:32 ?-------------------------------------------------------------------------------- ?Clinic Level of Care Assessment Details ?Patient Name: Date of Service: ?Katrina [name], Katrina D. 12/28/2021 9:00 A M ?Medical Record Number: [id number] ?Patient Account Number: [id number] ?Date of Birth/Sex: Treating RN: ?1931/10/08 (88 y.o. Female) Baruch Gouty ?Primary Care Amariyon Maynes: [name] Other Clinician: ?Referring Shalina Norfolk: ?Treating Marnisha Stampley/Extender: Fredirick Maudlin ?[name] ?Weeks in Treatment: 0 ?Clinic Level of Care Assessment Items ?TOOL 1 Quantity Score ?'[]'$  - 0 ?Use when EandM and Procedure is performed on INITIAL visit ?ASSESSMENTS - Nursing Assessment / Reassessment ?X- 1 20 ?General Physical Exam (combine w/ comprehensive assessment (listed just below) when performed on new pt. evals) ?X- 1 25 ?Comprehensive Assessment (HX, ROS, Risk Assessments, Wounds Hx, etc.) ?ASSESSMENTS - Wound and Skin Assessment / Reassessment ?'[]'$  - 0 ?Dermatologic / Skin Assessment (not related to wound area) ?ASSESSMENTS - Ostomy and/or Continence Assessment and Care ?'[]'$  - 0 ?Incontinence Assessment and Management ?'[]'$  - 0 ?Ostomy Care Assessment and Management (repouching, etc.) ?PROCESS - Coordination of Care ?X - Simple Patient / Family Education for ongoing care 1 15 ?'[]'$  - 0 ?Complex (extensive) Patient / Family Education for ongoing care ?X- 1 10 ?Staff obtains Consents, Records, T Results / Process Orders ?est ?'[]'$  - 0 ?Staff telephones HHA, Nursing Homes / Clarify orders / etc ?'[]'$  - 0 ?Routine Transfer to another Facility (non-emergent condition) ?'[]'$  - 0 ?Routine Hospital Admission (non-emergent condition) ?X- 1 15 ?New Admissions / Biomedical engineer / Ordering NPWT Apligraf, etc. ?, ?'[]'$  - 0 ?Emergency Hospital Admission (emergent condition) ?PROCESS - Special Needs ?'[]'$  - 0 ?Pediatric / Minor Patient Management ?'[]'$  - 0 ?Isolation Patient Management ?'[]'$  -  0 ?Hearing / Language / Visual special needs ?'[]'$  - 0 ?Assessment of Community assistance (transportation, D/C planning, etc.) ?'[]'$  - 0 ?Additional assistance / Altered mentation ?'[]'$  - 0 ?Support Surface(s) Assessment (bed, cushion, seat, etc.) ?INTERVENTIONS - Miscellaneous ?'[]'$  - 0 ?External ear exam ?'[]'$  - 0 ?Patient Transfer (multiple staff / Harrel Lemon  Lift / Similar devices) ?'[]'$  - 0 ?Simple Staple / Suture removal (25 or less) ?'[]'$  - 0 ?Complex Staple / Suture removal (26 or more) ?'[]'$  - 0 ?Hypo/Hyperglycemic Management (do not check if billed separately) ?'[]'$  - 0 ?Ankle / Brachial Index (ABI) - do not check if billed separately ?Has the patient been seen at the hospital within the last three years: Yes ?Total Score: 85 ?Level Of Care: New/Established - Level 3 ?Electronic Signature(s) ?Signed: 12/28/2021 5:43:51 PM By: Baruch Gouty RN, BSN ?Signed: 12/28/2021 5:43:51 PM By: Baruch Gouty RN, BSN ?Entered By: Baruch Gouty on 12/28/2021 09:42:52 ?-------------------------------------------------------------------------------- ?Encounter Discharge Information Details ?Patient Name: Date of Service: ?Katrina [name], Katrina D. 12/28/2021 9:00 A M ?Medical Record Number: [id number] ?Patient Account Number: [id number] ?Date of Birth/Sex: Treating RN: ?1931/09/22 (88 y.o. Female) Baruch Gouty ?Primary Care Debra Calabretta: [name] Other Clinician: ?Referring Annissa Andreoni: ?Treating Marylin Lathon/Extender: Fredirick Maudlin ?[name] ?Weeks in Treatment: 0 ?Encounter Discharge Information Items Post Procedure Vitals ?Discharge Condition: Stable ?Temperature (F): 98.6 ?Ambulatory Status: Gilford Rile ?Pulse (bpm): 65 ?Discharge Destination: Home ?Respiratory Rate (breaths/min): 18 ?Transportation: Other ?Blood Pressure (mmHg): 126/77 ?Accompanied By: self ?Schedule Follow-up Appointment: Yes ?Clinical Summary of Care: Patient Declined ?Notes ?assisted living, facility transportation ?Electronic Signature(s) ?Signed: 12/28/2021 5:43:51 PM By:  Baruch Gouty RN, BSN ?Entered By: Baruch Gouty on 12/28/2021 10:04:31 ?-------------------------------------------------------------------------------- ?Multi Wound Chart Details ?Patient Name: Date of Service: ?Katrina [name], Katrina D. 12/28/2021 9:00 A M ?Medical Record Number: [id number] ?Patient Account Number: [id number] ?Date of Birth/Sex: Treating RN: ?March 23, 1932 (88 y.o. Female) Baruch Gouty ?Primary Care Skye Plamondon: [name] Other Clinician: ?Referring Geremy Rister: ?Treating Tamirah George/Extender: Fredirick Maudlin ?[name] ?Weeks in Treatment: 0 ?Vital Signs ?Height(in): ?Pulse(bpm): 65 ?Weight(lbs): ?Blood Pressure(mmHg): 126/77 ?Body Mass Index(BMI): ?Temperature(??F): 98.6 ?Respiratory Rate(breaths/min): 18 ?Photos: [N/A:N/A] ?Left Gluteus N/A N/A ?Wound Location: ?Pressure Injury N/A N/A ?Wounding Event: ?Pressure Ulcer N/A N/A ?Primary Etiology: ?Sleep Apnea, Arrhythmia, N/A N/A ?Comorbid History: ?Hypertension, Phlebitis ?08/23/2021 N/A N/A ?Date Acquired: ?0 N/A N/A ?Weeks of Treatment: ?Open N/A N/A ?Wound Status: ?No N/A N/A ?Wound Recurrence: ?1.6x1x0.2 N/A N/A ?Measurements L x W x D (cm) ?1.257 N/A N/A ?A (cm?) : ?rea ?0.251 N/A N/A ?Volume (cm?) : ?0.00% N/A N/A ?% Reduction in A rea: ?0.00% N/A N/A ?% Reduction in Volume: ?Category/Stage III N/A N/A ?Classification: ?Medium N/A N/A ?Exudate A mount: ?Serosanguineous N/A N/A ?Exudate Type: ?red, brown N/A N/A ?Exudate Color: ?Flat and Intact N/A N/A ?Wound Margin: ?Large (67-100%) N/A N/A ?Granulation A mount: ?Red, Pink N/A N/A ?Granulation Quality: ?Small (1-33%) N/A N/A ?Necrotic A mount: ?Fat Layer (Subcutaneous Tissue): Yes N/A N/A ?Exposed Structures: ?Fascia: No ?Tendon: No ?Muscle: No ?Joint: No ?Bone: No ?None N/A N/A ?Epithelialization: ?Debridement - Excisional N/A N/A ?Debridement: ?Pre-procedure Verification/Time Out 09:35 N/A N/A ?Taken: ?Other N/A N/A ?Pain Control: ?Subcutaneous, Slough N/A N/A ?Tissue  Debrided: ?Skin/Subcutaneous Tissue N/A N/A ?Level: ?1.6 N/A N/A ?Debridement A (sq cm): ?rea ?Curette N/A N/A ?Instrument: ?Minimum N/A N/A ?Bleeding: ?Silver Nitrate N/A N/A ?Hemostasis A chieved: ?0 N/A N/A ?Procedural Pain: ?0 N/A N/A ?P

## 2021-12-28 NOTE — Progress Notes (Signed)
Laux, Darryll Capers D. ([id number]) ?Visit Report for 12/28/2021 ?Abuse Risk Screen Details ?Patient Name: Date of Service: ?Katrina [name], Katrina D. 12/28/2021 9:00 A M ?Medical Record Number: [id number] ?Patient Account Number: [id number] ?Date of Birth/Sex: Treating RN: ?Mar 30, 1932 (88 y.o. Female) Levan Hurst ?Primary Care Kevin Space: [name] Other Clinician: ?Referring Gavon Majano: ?Treating Ellie Spickler/Extender: Fredirick Maudlin ?[name] ?Weeks in Treatment: 0 ?Abuse Risk Screen Items ?Answer ?ABUSE RISK SCREEN: ?Has anyone close to you tried to hurt or harm you recentlyo No ?Do you feel uncomfortable with anyone in your familyo No ?Has anyone forced you do things that you didnt want to doo No ?Electronic Signature(s) ?Signed: 12/28/2021 5:39:57 PM By: Levan Hurst RN, BSN ?Entered By: Levan Hurst on 12/28/2021 09:25:45 ?-------------------------------------------------------------------------------- ?Activities of Daily Living Details ?Patient Name: Date of Service: ?Katrina [name], Katrina D. 12/28/2021 9:00 A M ?Medical Record Number: [id number] ?Patient Account Number: [id number] ?Date of Birth/Sex: Treating RN: ?1931/09/19 (88 y.o. Female) Levan Hurst ?Primary Care Jayd Cadieux: [name] Other Clinician: ?Referring Rayven Rettig: ?Treating Nastasha Reising/Extender: Fredirick Maudlin ?[name] ?Weeks in Treatment: 0 ?Activities of Daily Living Items ?Answer ?Activities of Daily Living (Please select one for each item) ?Drive Automobile Not Able ?T Medications ?ake Need Assistance ?Use T elephone Completely Able ?Care for Appearance Completely Able ?Use T oilet Completely Able ?Bath / Shower Need Assistance ?Dress Self Completely Able ?Feed Self Completely Able ?Walk Completely Able ?Get In / Out Bed Completely Able ?Housework Need Assistance ?Prepare Meals Need Assistance ?Handle Money Need Assistance ?Shop for Self Need Assistance ?Electronic Signature(s) ?Signed: 12/28/2021 5:39:57 PM By: Levan Hurst RN, BSN ?Entered By: Levan Hurst on 12/28/2021 09:26:13 ?-------------------------------------------------------------------------------- ?Education Screening Details ?Patient Name: ?Date of Service: ?Katrina [name], Katrina D. 12/28/2021 9:00 A M ?Medical Record Number: [id number] ?Patient Account Number: [id number] ?Date of Birth/Sex: ?Treating RN: ?September 29, 1931 (88 y.o. Female) Levan Hurst ?Primary Care Dagan Heinz: [name] ?Other Clinician: ?Referring Arieon Scalzo: ?Treating Tniyah Nakagawa/Extender: Fredirick Maudlin ?[name] ?Weeks in Treatment: 0 ?Primary Learner Assessed: Patient ?Learning Preferences/Education Level/Primary Language ?Learning Preference: Explanation, Demonstration, Printed Material ?Highest Education Level: College or Above ?Preferred Language: English ?Cognitive Barrier ?Language Barrier: No ?Translator Needed: No ?Memory Deficit: No ?Emotional Barrier: No ?Cultural/Religious Beliefs Affecting Medical Care: No ?Physical Barrier ?Impaired Vision: No ?Impaired Hearing: No ?Decreased Hand dexterity: No ?Knowledge/Comprehension ?Knowledge Level: High ?Comprehension Level: High ?Ability to understand written instructions: High ?Ability to understand verbal instructions: High ?Motivation ?Anxiety Level: Calm ?Cooperation: Cooperative ?Education Importance: Acknowledges Need ?Interest in Health Problems: Asks Questions ?Perception: Coherent ?Willingness to Engage in Self-Management High ?Activities: ?Readiness to Engage in Self-Management High ?Activities: ?Electronic Signature(s) ?Signed: 12/28/2021 5:39:57 PM By: Levan Hurst RN, BSN ?Entered By: Levan Hurst on 12/28/2021 09:26:38 ?-------------------------------------------------------------------------------- ?Fall Risk Assessment Details ?Patient Name: ?Date of Service: ?Katrina [name], Katrina D. 12/28/2021 9:00 A M ?Medical Record Number: [id number] ?Patient Account Number: [id number] ?Date of Birth/Sex: ?Treating RN: ?[birth year] (88 y.o. Female) Levan Hurst ?Primary Care Kelsei Defino:  [name] ?Other Clinician: ?Referring Kylieann Eagles: ?Treating Ebb Carelock/Extender: Fredirick Maudlin ?[name] ?Weeks in Treatment: 0 ?Fall Risk Assessment Items ?Have you had 2 or more falls in the last 12 monthso 0 No ?Have you had any fall that resulted in injury in the last 12 monthso 0 No ?FALLS RISK SCREEN ?History of falling - immediate or within 3 months 0 No ?Secondary diagnosis (Do you have 2 or more medical diagnoseso) 15 Yes ?Ambulatory aid ?None/bed rest/wheelchair/nurse 0 No ?Crutches/cane/walker 15 Yes ?Furniture 0 No ?Intravenous therapy Access/Saline/Heparin Lock 0 No ?Gait/Transferring ?Normal/ bed rest/ wheelchair 0  Yes ?Weak (short steps with or without shuffle, stooped but able to lift head while walking, may seek 0 No ?support from furniture) ?Impaired (short steps with shuffle, may have difficulty arising from chair, head down, impaired 0 No ?balance) ?Mental Status ?Oriented to own ability 0 Yes ?Electronic Signature(s) ?Signed: 12/28/2021 5:39:57 PM By: Levan Hurst RN, BSN ?Entered By: Levan Hurst on 12/28/2021 09:26:50 ?-------------------------------------------------------------------------------- ?Nutrition Risk Screening Details ?Patient Name: ?Date of Service: ?Katrina [name], Katrina D. 12/28/2021 9:00 A M ?Medical Record Number: [id number] ?Patient Account Number: [id number] ?Date of Birth/Sex: ?Treating RN: ?[birth year] (88 y.o. Female) Levan Hurst ?Primary Care Talli Kimmer: [name] ?Other Clinician: ?Referring Graeden Bitner: ?Treating Ashok Sawaya/Extender: Fredirick Maudlin ?[name] ?Weeks in Treatment: 0 ?Height (in): ?Weight (lbs): ?Body Mass Index (BMI): ?Nutrition Risk Screening Items ?Score Screening ?NUTRITION RISK SCREEN: ?I have an illness or condition that made me change the kind and/or amount of food I eat 0 No ?I eat fewer than two meals per day 0 No ?I eat few fruits and vegetables, or milk products 0 No ?I have three or more drinks of beer, liquor or wine almost  every day 0 No ?I have tooth or mouth problems that make it hard for me to eat 0 No ?I don't always have enough money to buy the food I need 0 No ?I eat alone most of the time 0 No ?I take three or more different prescribed or over-the-counter drugs a day 1 Yes ?Without wanting to, I have lost or gained 10 pounds in the last six months 0 No ?I am not always physically able to shop, cook and/or feed myself 2 Yes ?Nutrition Protocols ?Good Risk Protocol ?Moderate Risk Protocol 0 Provide education on nutrition ?High Risk Proctocol ?Risk Level: Moderate Risk ?Score: 3 ?Electronic Signature(s) ?Signed: 12/28/2021 5:39:57 PM By: Levan Hurst RN, BSN ?Entered By: Levan Hurst on 12/28/2021 09:26:58 ?

## 2021-12-29 NOTE — Progress Notes (Signed)
Mcbryar, Darryll Capers D. ([id number]) ?Visit Report for 12/28/2021 ?Chief Complaint Document Details ?Patient Name: Date of Service: ?Katrina [name], Katrina D. 12/28/2021 9:00 A M ?Medical Record Number: [id number] ?Patient Account Number: [id number] ?Date of Birth/Sex: Treating RN: ?10-23-1931 (88 y.o. Female) Baruch Gouty ?Primary Care Provider: [name] Other Clinician: ?Referring Provider: ?Treating Provider/Extender: Fredirick Maudlin ?[name] ?Weeks in Treatment: 0 ?Information Obtained from: Patient ?Chief Complaint ?Patient returns to the wound care center for reopened ulcer to: Left buttock ?Electronic Signature(s) ?Signed: 12/28/2021 10:04:45 AM By: Fredirick Maudlin MD FACS ?Entered By: Fredirick Maudlin on 12/28/2021 10:04:45 ?-------------------------------------------------------------------------------- ?Debridement Details ?Patient Name: Date of Service: ?LAVANNA, ROG D. 12/28/2021 9:00 A M ?Medical Record Number: [id number] ?Patient Account Number: [id number] ?Date of Birth/Sex: Treating RN: ?1932/05/15 (88 y.o. Female) Baruch Gouty ?Primary Care Provider: [name] Other Clinician: ?Referring Provider: ?Treating Provider/Extender: Fredirick Maudlin ?[name] ?Weeks in Treatment: 0 ?Debridement Performed for Assessment: Wound #14 Left Gluteus ?Performed By: Physician Fredirick Maudlin, MD ?Debridement Type: Debridement ?Level of Consciousness (Pre-procedure): Awake and Alert ?Pre-procedure Verification/Time Out Yes - 09:35 ?Taken: ?Start Time: 09:35 ?Pain Control: ?Other : benzocaine 20% spray ?T Area Debrided (L x W): ?otal 1.6 (cm) x 1 (cm) = 1.6 (cm?) ?Tissue and other material debrided: Viable, Non-Viable, Slough, Subcutaneous, Slough ?Level: Skin/Subcutaneous Tissue ?Debridement Description: Excisional ?Instrument: Curette ?Bleeding: Minimum ?Hemostasis Achieved: Silver Nitrate ?Procedural Pain: 0 ?Post Procedural Pain: 0 ?Response to Treatment: Procedure was tolerated well ?Level of Consciousness  (Post- Awake and Alert ?procedure): ?Post Debridement Measurements of Total Wound ?Length: (cm) 1.6 ?Stage: Category/Stage III ?Width: (cm) 1 ?Depth: (cm) 0.2 ?Volume: (cm?) 0.251 ?Character of Wound/Ulcer Post Debridement: Improved ?Post Procedure Diagnosis ?Same as Pre-procedure ?Electronic Signature(s) ?Signed: 12/28/2021 12:32:15 PM By: Fredirick Maudlin MD FACS ?Signed: 12/28/2021 5:43:51 PM By: Baruch Gouty RN, BSN ?Entered By: Baruch Gouty on 12/28/2021 09:45:02 ?-------------------------------------------------------------------------------- ?HPI Details ?Patient Name: Date of Service: ?Katrina [name], Katrina D. 12/28/2021 9:00 A M ?Medical Record Number: [id number] ?Patient Account Number: [id number] ?Date of Birth/Sex: Treating RN: ?1932-02-20 (88 y.o. Female) Baruch Gouty ?Primary Care Provider: [name] Other Clinician: ?Referring Provider: ?Treating Provider/Extender: Fredirick Maudlin ?[name] ?Weeks in Treatment: 0 ?History of Present Illness ?Location: right lower extremity ?Quality: Patient reports experiencing a dull pain to affected area(s). ?Severity: Patient states wound(s) are getting better. ?Duration: Patient has had the wound for 3 months prior to presenting for treatment ?Context: The wound would happen gradually ?Modifying Factors: Patient wound(s)/ulcer(s) are worsening due to :standing for a long while. ?HPI Description: Past medical history of pulmonary emboli, hypothyroidism, hyperlipidemia, hypertension, sleep apnea, gallstones, ulcerated lower ?extremities, atrial fibrillation, status post total knee replacement, cholecystectomy, cystoscopy and stent placement for kidney stones. ?She has never been a smoker. ?About a year ago she was seen by [name] with an ulcer of her right lower extremity which she's had for about over 3 months. ?On 10/23/14 -- Lower extremity venous duplex evaluation. She had no evidence of deep venous thromboses involving both lower extremities. There was ?evidence  of deep vein reflux in the right and left common femoral veins. She also had evidence of great saphenous vein reflux noted only at the level of the ?saphenofemoral junction. No evidence of greater or small saphenous vein reflux. She will need a consult to the vascular surgeon. ?Seen by Dr. Mathis Fare office note dated 11/13/2014. He noted that she had good arterial blood flow with palpable DP pulses bilaterally. Based on the patient's ?history Dr. Scot Dock recommended elevation when at rest of both  feet and knees and ankles above heart level in the supine position. Activity such as walking ?and pool exercises were recommended. She will also have 15-20 mm thigh-high compression stockings daily once the ulcers have completely healed. A ?prescription was given to her. ?11/04/2015 -- the wound on her right lower extremity has completely healed and her edema has gone down significantly. However in her popliteal fossa where ?she's got a lot of fungal infection there is an area which is now an open wound and this will need attention. ?12/02/2015 -- the right lower extremity looks much better than the edema has gone down significantly however she continues to have significant edema on the ?left side today. ?12/09/2015 -- the patient has been doing her dressings as advised and says that overall she feels much better. She has received her juxta light for the left ?lower extremity ?READMISSION ?06/25/2021 ?This is a patient who is actually had several previous visits to our clinic in 2014, 2016 and more recently 2017. This was largely because of wounds on her ?lower legs in the setting of chronic venous insufficiency and secondary lymphedema. Looking at my records she was apparently prescribed thigh-high ?compression stockings probably by vein and vascular although she comes back in not having worn any stockings in quite a period of time. She is now in the ?assisted living part of [facility]. She  thinks her wounds have been there since April. This includes the left lateral, right anterior ?and right posterior lateral parts of both lower legs. She also has a stage II pressure ulcer on her left glued I think which is happened because of sleeping in a lift ?chair. She had ABDs and kerlix on her bilateral lower legs. ?Past medical history reviewed she has hypothyroidism, left buttock pressure ulcer dating back to March of this year, iron deficiency, falls, paroxysmal atrial ?fibrillation anticoagulated and chronic venous insufficiency ?ABIs in our clinic were 0.86 on the right and 0.96 on the left ?11/10; patient comes into clinic today after being admitted last week with uncontrolled lymphedema, severe venous insufficiency and some superficial wounds ?on her bilateral lower legs. We put her in 3 layer compression and she comes in today with all the wounds closed and considerable improvement in the condition ?of her lower legs. The patient is going to need lifelong compression stockings probably juxta lites and we are going to go ahead and order those for her today. ?Unfortunately the POA here is a niece in [place] were probably going to have to talk with her to arrange proper payment for the juxta lite stockings. She ?is in the assisted living level of [facility]. She does not feel she can get the stockings on independently I am hopeful that the nurse and aids at ?the assisted living can help her with this otherwise this may be a problem. ?She also has an area on her left buttock which is a stage II wound. A lot of unhealthy irritated skin around this I think from moisture prolonged sittingo ?Incontinence ?11/17; the patient's area on the left buttock is just about closed although there is still a lot of unhealthy looking discolored skin around a large area here. I ?suspect this is chronic moisture, pressure, perhaps incontinence. she has minuscule open areas on her bilateral lower legs.  Apparently our intermediary did not ?get the order for juxta lite stockings therefore she does not have any. ?12/1; both leg wounds are healed and she has been wearing bilateral juxta  lite stockings for 2 days

## 2022-01-11 ENCOUNTER — Encounter (HOSPITAL_BASED_OUTPATIENT_CLINIC_OR_DEPARTMENT_OTHER): Payer: Medicare Other | Admitting: General Surgery

## 2022-01-11 DIAGNOSIS — L89322 Pressure ulcer of left buttock, stage 2: Secondary | ICD-10-CM | POA: Diagnosis not present

## 2022-01-11 NOTE — Progress Notes (Signed)
Katrina [name], Katrina [name] ([id number]) Visit Report for 01/11/2022 Chief Complaint Document Details Patient Name: Date of Service: [name], [name] 01/11/2022 12:30 PM Medical Record Number: [id number] Patient Account Number: [id number] Date of Birth/Sex: Treating RN: 1932-06-15 (88 y.o. [name] Primary Care Provider: [name] Other Clinician: Referring Provider: Treating Provider/Extender: Kathlyn Sacramento in Treatment: 2 Information Obtained from: Patient Chief Complaint Patient returns to the wound care center for reopened ulcer to: Left buttock Electronic Signature(s) Signed: 01/11/2022 1:21:04 PM By: Katrina Maudlin MD FACS Entered By: Katrina [name] on 01/11/2022 13:21:04 -------------------------------------------------------------------------------- HPI Details Patient Name: Date of Service: Katrina Amel D. 01/11/2022 12:30 PM Medical Record Number: [id number] Patient Account Number: [id number] Date of Birth/Sex: Treating RN: 08-Nov-1931 (88 y.o. [name] Primary Care Provider: [name] Other Clinician: Referring Provider: Treating Provider/Extender: Kathlyn Sacramento in Treatment: 2 History of Present Illness Location: right lower extremity Quality: Patient reports experiencing a dull pain to affected area(s). Severity: Patient states wound(s) are getting better. Duration: Patient has had the wound for 3 months prior to presenting for treatment Context: The wound would happen gradually Modifying Factors: Patient wound(s)/ulcer(s) are worsening due to :standing for a long while. HPI Description: Past medical history of pulmonary emboli, hypothyroidism, hyperlipidemia, hypertension, sleep apnea, gallstones, ulcerated lower extremities, atrial fibrillation, status post total knee replacement, cholecystectomy, cystoscopy and stent placement for kidney stones. She has never been a smoker. About a year ago she was  seen by [name] with an ulcer of her right lower extremity which she's had for about over 3 months. On 10/23/14 -- Lower extremity venous duplex evaluation. She had no evidence of deep venous thromboses involving both lower extremities. There was evidence of deep vein reflux in the right and left common femoral veins. She also had evidence of great saphenous vein reflux noted only at the level of the saphenofemoral junction. No evidence of greater or small saphenous vein reflux. She will need a consult to the vascular surgeon. Seen by Dr. Mathis Fare office note dated 11/13/2014. He noted that she had good arterial blood flow with palpable DP pulses bilaterally. Based on the patient's history Dr. Scot Dock recommended elevation when at rest of both feet and knees and ankles above heart level in the supine position. Activity such as walking and pool exercises were recommended. She will also have 15-20 mm thigh-high compression stockings daily once the ulcers have completely healed. A prescription was given to her. 11/04/2015 -- the wound on her right lower extremity has completely healed and her edema has gone down significantly. However in her popliteal fossa where she's got a lot of fungal infection there is an area which is now an open wound and this will need attention. 12/02/2015 -- the right lower extremity looks much better than the edema has gone down significantly however she continues to have significant edema on the left side today. 12/09/2015 -- the patient has been doing her dressings as advised and says that overall she feels much better. She has received her juxta light for the left lower extremity READMISSION 06/25/2021 This is a patient who is actually had several previous visits to our clinic in 2014, 2016 and more recently 2017. This was largely because of wounds on her lower legs in the setting of chronic venous insufficiency and secondary lymphedema. Looking at my records she was apparently  prescribed thigh-high compression stockings probably by vein and vascular although she comes back in not having worn any stockings in  quite a period of time. She is now in the assisted living part of [facility]. She thinks her wounds have been there since April. This includes the left lateral, right anterior and right posterior lateral parts of both lower legs. She also has a stage II pressure ulcer on her left glued I think which is happened because of sleeping in a lift chair. She had ABDs and kerlix on her bilateral lower legs. Past medical history reviewed she has hypothyroidism, left buttock pressure ulcer dating back to March of this year, iron deficiency, falls, paroxysmal atrial fibrillation anticoagulated and chronic venous insufficiency ABIs in our clinic were 0.86 on the right and 0.96 on the left 11/10; patient comes into clinic today after being admitted last week with uncontrolled lymphedema, severe venous insufficiency and some superficial wounds on her bilateral lower legs. We put her in 3 layer compression and she comes in today with all the wounds closed and considerable improvement in the condition of her lower legs. The patient is going to need lifelong compression stockings probably juxta lites and we are going to go ahead and order those for her today. Unfortunately the POA here is a niece in [place] were probably going to have to talk with her to arrange proper payment for the juxta lite stockings. She is in the assisted living level of [facility]. She does not feel she can get the stockings on independently I am hopeful that the nurse and aids at the assisted living can help her with this otherwise this may be a problem. She also has an area on her left buttock which is a stage II wound. A lot of unhealthy irritated skin around this I think from moisture prolonged sittingo Incontinence 11/17; the patient's area on the left  buttock is just about closed although there is still a lot of unhealthy looking discolored skin around a large area here. I suspect this is chronic moisture, pressure, perhaps incontinence. she has minuscule open areas on her bilateral lower legs. Apparently our intermediary did not get the order for juxta lite stockings therefore she does not have any. 12/1; both leg wounds are healed and she has been wearing bilateral juxta lite stockings for 2 days. [facility] are [facility] her with this. The left buttock wound unfortunately has deteriorated 12/15; 2-week follow-up. Her legs remain healed she has the other area which is on her left buttock. This is surrounded circumferentially by chronically irritated skin. Mirror image on the right side as well. I suspect this is chronic pressure friction and moisture from incontinence. This makes it difficult to get this area to completely close. We have been using silver alginate 12/29 both her legs remain closed and she has bilateral juxta lite stockings. The real problem here is her left buttock she has a small wound in the mid part of a extremely irritated dermatitis. It almost looks bruised in spots. There is superficial loss of epithelium. The actual wound that we have been dealing with does not look so bad. I have been assuming this is some combination of friction pressure wetness from constant urinary incontinence. If there is a dermatologic issue outside of that here I am not seeing it 09/03/2021; patient with a small area on the left buttock. The real problem here is a marked surrounding erythematous dermatitis with loss of surface epithelium. It looks as though she is beginning to develop satellite lesions this may be mostly fungal 1/26; the area  on her left buttock is closed however she still has very irritated macerated skin in this area. Some of this appears fungal. I treated this with ketoconazole last time and I think it looks somewhat  better although there is still some area present that still looks like a fungal dermatitis. Everything on the right buttock looks a lot better. READMISSION 12/28/2021: The patient returns today with a reopening of a wound on her left buttock. She says it has been present for about 6 months; I am not sure how this is possible since she was seen for similar in January and subsequently discharged from the clinic. Nonetheless, she has some purplish discoloration of her buttock with an open area and some scarring around the perimeter of a shallow stage II ulcer on her buttock. According to the previous clinic notes, there was some question as to whether or not this was due to fungal breakdown of her skin. She currently resides at [facility], in the assisted living section. She says that she cannot see the site and is unable to care for it herself. 01/11/2022: The wound is a little bit smaller today but has a very friable surface. It is an odd wound. On further inspection, it is almost as if there are dilated vessels and the underlying skin as I can compress the surrounding tissue, it dents in, and then fills back up, as if with fluid or blood. She did have an episode during the week when she had a lot of blood come from the wound, but is not actively bleeding today. Electronic Signature(s) Signed: 01/11/2022 1:24:02 PM By: Katrina Maudlin MD FACS Entered By: Katrina [name] on 01/11/2022 13:24:02 -------------------------------------------------------------------------------- Chemical Cauterization Details Patient Name: Date of Service: Katrina Amel D. 01/11/2022 12:30 PM Medical Record Number: [id number] Patient Account Number: [id number] Date of Birth/Sex: Treating RN: 1932/05/15 (88 y.o. [name] Primary Care Provider: [name] Other Clinician: Referring Provider: Treating Provider/Extender: Kathlyn Sacramento in Treatment: 2 Procedure Performed for: Wound  #14 Left Gluteus Performed By: Physician Katrina Maudlin, MD Post Procedure Diagnosis Same as Pre-procedure Notes using silver nitrate stick Electronic Signature(s) Signed: 01/11/2022 1:54:44 PM By: Katrina Maudlin MD FACS Signed: 01/11/2022 4:58:18 PM By: Baruch Gouty RN, BSN Entered By: Baruch Gouty on 01/11/2022 13:05:03 -------------------------------------------------------------------------------- Physical Exam Details Patient Name: Date of Service: Katrina Amel D. 01/11/2022 12:30 PM Medical Record Number: [id number] Patient Account Number: [id number] Date of Birth/Sex: Treating RN: 06/07/1932 (88 y.o. [name] Primary Care Provider: [name] Other Clinician: Referring Provider: Treating Provider/Extender: Kathlyn Sacramento in Treatment: 2 Constitutional She is hypertensive, but asymptomatic.. . . . No acute distress. Respiratory Normal work of breathing on room air. Notes 01/11/2022: The wound is a little bit smaller today but has a very friable surface. It is an odd wound. On further inspection, it is almost as if there are dilated vessels and the underlying skin as I can compress the surrounding tissue, it dents in, and then fills back up, as if with fluid or blood. The surface is clean. Electronic Signature(s) Signed: 01/11/2022 1:24:54 PM By: Katrina Maudlin MD FACS Entered By: Katrina [name] on 01/11/2022 13:24:54 -------------------------------------------------------------------------------- Physician Orders Details Patient Name: Date of Service: Katrina Amel D. 01/11/2022 12:30 PM Medical Record Number: [id number] Patient Account Number: [id number] Date of Birth/Sex: Treating RN: 01-Sep-1931 (88 y.o. [name] Primary Care Provider: [name] Other Clinician: Referring Provider: Treating Provider/Extender: Kathlyn Sacramento in Treatment:  2 Verbal / Phone Orders: No Diagnosis  Coding ICD-10 Coding Code Description L89.322 Pressure ulcer of left buttock, stage 2 I87.2 Venous insufficiency (chronic) (peripheral) I10 Essential (primary) hypertension Follow-up Appointments ppointment in 2 weeks. - Dr. Celine Ahr Rm 1 Return A Monday 6/5 @ 1:15 pm Bathing/ Shower/ Hygiene May shower and wash wound with soap and water. Off-Loading Turn and reposition every 2 hours - stand for a few minutes at least every hour during the day Wound Treatment Wound #14 - Gluteus Wound Laterality: Left Cleanser: Soap and Water 1 x Per Day/30 Days Discharge Instructions: May shower and wash wound with dial antibacterial soap and water prior to dressing change. Prim Dressing: KerraCel Ag Gelling Fiber Dressing, 2x2 in (silver alginate) 1 x Per Day/30 Days ary Discharge Instructions: Apply silver alginate to wound bed as instructed Secondary Dressing: ALLEVYN Gentle Border, 4x4 (in/in) 1 x Per Day/30 Days Discharge Instructions: Apply over primary dressing as directed. Electronic Signature(s) Signed: 01/11/2022 1:54:44 PM By: Katrina Maudlin MD FACS Entered By: Katrina [name] on 01/11/2022 13:25:19 -------------------------------------------------------------------------------- Problem List Details Patient Name: Date of Service: Katrina Amel D. 01/11/2022 12:30 PM Medical Record Number: [id number] Patient Account Number: [id number] Date of Birth/Sex: Treating RN: [birth year] (88 y.o. [name] Primary Care Provider: [name] Other Clinician: Referring Provider: Treating Provider/Extender: Kathlyn Sacramento in Treatment: 2 Active Problems ICD-10 Encounter Code Description Active Date MDM Diagnosis [phone] Pressure ulcer of left buttock, stage 2 12/28/2021 No Yes I87.2 Venous insufficiency (chronic) (peripheral) 12/28/2021 No Yes I10 Essential (primary) hypertension 12/28/2021 No Yes Inactive Problems Resolved Problems Electronic  Signature(s) Signed: 01/11/2022 1:07:35 PM By: Katrina Maudlin MD FACS Entered By: Katrina [name] on 01/11/2022 13:07:35 -------------------------------------------------------------------------------- Progress Note Details Patient Name: Date of Service: Katrina Amel D. 01/11/2022 12:30 PM Medical Record Number: [id number] Patient Account Number: [id number] Date of Birth/Sex: Treating RN: [birth year] (88 y.o. [name] Primary Care Provider: [name] Other Clinician: Referring Provider: Treating Provider/Extender: Kathlyn Sacramento in Treatment: 2 Subjective Chief Complaint Information obtained from Patient Patient returns to the wound care center for reopened ulcer to: Left buttock History of Present Illness (HPI) The following HPI elements were documented for the patient's wound: Location: right lower extremity Quality: Patient reports experiencing a dull pain to affected area(s). Severity: Patient states wound(s) are getting better. Duration: Patient has had the wound for 3 months prior to presenting for treatment Context: The wound would happen gradually Modifying Factors: Patient wound(s)/ulcer(s) are worsening due to :standing for a long while. Past medical history of pulmonary emboli, hypothyroidism, hyperlipidemia, hypertension, sleep apnea, gallstones, ulcerated lower extremities, atrial fibrillation, status post total knee replacement, cholecystectomy, cystoscopy and stent placement for kidney stones. She has never been a smoker. About a year ago she was seen by [name] with an ulcer of her right lower extremity which she's had for about over 3 months. On 10/23/14 -- Lower extremity venous duplex evaluation. She had no evidence of deep venous thromboses involving both lower extremities. There was evidence of deep vein reflux in the right and left common femoral veins. She also had evidence of great saphenous vein reflux noted only at the level  of the saphenofemoral junction. No evidence of greater or small saphenous vein reflux. She will need a consult to the vascular surgeon. Seen by Dr. Mathis Fare office note dated 11/13/2014. He noted that she had good arterial blood flow with palpable DP pulses bilaterally. Based on the patient's history Dr. Scot Dock recommended elevation  when at rest of both feet and knees and ankles above heart level in the supine position. Activity such as walking and pool exercises were recommended. She will also have 15-20 mm thigh-high compression stockings daily once the ulcers have completely healed. A prescription was given to her. 11/04/2015 -- the wound on her right lower extremity has completely healed and her edema has gone down significantly. However in her popliteal fossa where she's got a lot of fungal infection there is an area which is now an open wound and this will need attention. 12/02/2015 -- the right lower extremity looks much better than the edema has gone down significantly however she continues to have significant edema on the left side today. 12/09/2015 -- the patient has been doing her dressings as advised and says that overall she feels much better. She has received her juxta light for the left lower extremity READMISSION 06/25/2021 This is a patient who is actually had several previous visits to our clinic in 2014, 2016 and more recently 2017. This was largely because of wounds on her lower legs in the setting of chronic venous insufficiency and secondary lymphedema. Looking at my records she was apparently prescribed thigh-high compression stockings probably by vein and vascular although she comes back in not having worn any stockings in quite a period of time. She is now in the assisted living part of [facility]. She thinks her wounds have been there since April. This includes the left lateral, right anterior and right posterior lateral parts of both lower  legs. She also has a stage II pressure ulcer on her left glued I think which is happened because of sleeping in a lift chair. She had ABDs and kerlix on her bilateral lower legs. Past medical history reviewed she has hypothyroidism, left buttock pressure ulcer dating back to March of this year, iron deficiency, falls, paroxysmal atrial fibrillation anticoagulated and chronic venous insufficiency ABIs in our clinic were 0.86 on the right and 0.96 on the left 11/10; patient comes into clinic today after being admitted last week with uncontrolled lymphedema, severe venous insufficiency and some superficial wounds on her bilateral lower legs. We put her in 3 layer compression and she comes in today with all the wounds closed and considerable improvement in the condition of her lower legs. The patient is going to need lifelong compression stockings probably juxta lites and we are going to go ahead and order those for her today. Unfortunately the POA here is a niece in [place] were probably going to have to talk with her to arrange proper payment for the juxta lite stockings. She is in the assisted living level of [facility]. She does not feel she can get the stockings on independently I am hopeful that the nurse and aids at the assisted living can help her with this otherwise this may be a problem. She also has an area on her left buttock which is a stage II wound. A lot of unhealthy irritated skin around this I think from moisture prolonged sittingo Incontinence 11/17; the patient's area on the left buttock is just about closed although there is still a lot of unhealthy looking discolored skin around a large area here. I suspect this is chronic moisture, pressure, perhaps incontinence. she has minuscule open areas on her bilateral lower legs. Apparently our intermediary did not get the order for juxta lite stockings therefore she does not have any. 12/1; both leg wounds are healed and  she  has been wearing bilateral juxta lite stockings for 2 days. [facility] are [facility] her with this. The left buttock wound unfortunately has deteriorated 12/15; 2-week follow-up. Her legs remain healed she has the other area which is on her left buttock. This is surrounded circumferentially by chronically irritated skin. Mirror image on the right side as well. I suspect this is chronic pressure friction and moisture from incontinence. This makes it difficult to get this area to completely close. We have been using silver alginate 12/29 both her legs remain closed and she has bilateral juxta lite stockings. The real problem here is her left buttock she has a small wound in the mid part of a extremely irritated dermatitis. It almost looks bruised in spots. There is superficial loss of epithelium. The actual wound that we have been dealing with does not look so bad. I have been assuming this is some combination of friction pressure wetness from constant urinary incontinence. If there is a dermatologic issue outside of that here I am not seeing it 09/03/2021; patient with a small area on the left buttock. The real problem here is a marked surrounding erythematous dermatitis with loss of surface epithelium. It looks as though she is beginning to develop satellite lesions this may be mostly fungal 1/26; the area on her left buttock is closed however she still has very irritated macerated skin in this area. Some of this appears fungal. I treated this with ketoconazole last time and I think it looks somewhat better although there is still some area present that still looks like a fungal dermatitis. Everything on the right buttock looks a lot better. READMISSION 12/28/2021: The patient returns today with a reopening of a wound on her left buttock. She says it has been present for about 6 months; I am not sure how this is possible since she was seen for similar in January and subsequently  discharged from the clinic. Nonetheless, she has some purplish discoloration of her buttock with an open area and some scarring around the perimeter of a shallow stage II ulcer on her buttock. According to the previous clinic notes, there was some question as to whether or not this was due to fungal breakdown of her skin. She currently resides at [facility], in the assisted living section. She says that she cannot see the site and is unable to care for it herself. 01/11/2022: The wound is a little bit smaller today but has a very friable surface. It is an odd wound. On further inspection, it is almost as if there are dilated vessels and the underlying skin as I can compress the surrounding tissue, it dents in, and then fills back up, as if with fluid or blood. She did have an episode during the week when she had a lot of blood come from the wound, but is not actively bleeding today. Patient History Information obtained from Patient. Family History Cancer - Siblings, Heart Disease - Mother,Father, Hypertension - Father, No family history of Hereditary Spherocytosis, Kidney Disease, Lung Disease, Seizures, Stroke, Thyroid Problems, Tuberculosis. Social History Never smoker, Marital Status - Single, Alcohol Use - Never, Drug Use - No History, Caffeine Use - Rarely. Medical History Eyes Denies history of Cataracts, Glaucoma, Optic Neuritis Ear/Nose/Mouth/Throat Denies history of Chronic sinus problems/congestion, Middle ear problems Hematologic/Lymphatic Denies history of Anemia, Hemophilia, Human Immunodeficiency Virus, Lymphedema, Sickle Cell Disease Respiratory Patient has history of Sleep Apnea - does not use Cpap or 02 as ordered Denies history of Aspiration,  Asthma, Chronic Obstructive Pulmonary Disease (COPD), Pneumothorax, Tuberculosis Cardiovascular Patient has history of Arrhythmia - A-Fib, Hypertension, Phlebitis Denies history of Angina, Congestive Heart Failure, Coronary Artery  Disease, Deep Vein Thrombosis, Hypotension, Myocardial Infarction, Peripheral Arterial Disease, Peripheral Venous Disease, Vasculitis Gastrointestinal Denies history of Cirrhosis , Colitis, Crohnoos, Hepatitis A, Hepatitis B, Hepatitis C Endocrine Denies history of Type I Diabetes, Type II Diabetes Genitourinary Denies history of End Stage Renal Disease Immunological Denies history of Lupus Erythematosus, Raynaudoos, Scleroderma Integumentary (Skin) Denies history of History of Burn Musculoskeletal Denies history of Gout, Rheumatoid Arthritis, Osteoarthritis, Osteomyelitis Neurologic Denies history of Dementia, Neuropathy, Quadriplegia, Paraplegia, Seizure Disorder Oncologic Denies history of Received Chemotherapy, Received Radiation Psychiatric Denies history of Anorexia/bulimia, Confinement Anxiety Medical A Surgical History Notes nd Cardiovascular Hyperlipidemia Endocrine Hypothyroidism Objective Constitutional She is hypertensive, but asymptomatic.Marland Kitchen No acute distress. Vitals Time Taken: 12:40 PM, Temperature: 98.5 F, Pulse: 65 bpm, Respiratory Rate: 18 breaths/min, Blood Pressure: 148/79 mmHg. Respiratory Normal work of breathing on room air. General Notes: 01/11/2022: The wound is a little bit smaller today but has a very friable surface. It is an odd wound. On further inspection, it is almost as if there are dilated vessels and the underlying skin as I can compress the surrounding tissue, it dents in, and then fills back up, as if with fluid or blood. The surface is clean. Integumentary (Hair, Skin) Wound #14 status is Open. Original cause of wound was Pressure Injury. The date acquired was: 08/23/2021. The wound has been in treatment 2 weeks. The wound is located on the Left Gluteus. The wound measures 0.5cm length x 0.6cm width x 0.1cm depth; 0.236cm^2 area and 0.024cm^3 volume. There is Fat Layer (Subcutaneous Tissue) exposed. There is no tunneling or undermining  noted. There is a medium amount of serosanguineous drainage noted. The wound margin is thickened. There is large (67-100%) red, friable granulation within the wound bed. There is no necrotic tissue within the wound bed. Assessment Active Problems ICD-10 Pressure ulcer of left buttock, stage 2 Venous insufficiency (chronic) (peripheral) Essential (primary) hypertension Procedures Wound #14 Pre-procedure diagnosis of Wound #14 is a Pressure Ulcer located on the Left Gluteus . An Chemical Cauterization procedure was performed by Katrina Maudlin, MD. Post procedure Diagnosis Wound #14: Same as Pre-Procedure Notes: using silver nitrate stick Plan Follow-up Appointments: Return Appointment in 2 weeks. - Dr. Celine Ahr Rm 1 Monday 6/5 @ 1:15 pm Bathing/ Shower/ Hygiene: May shower and wash wound with soap and water. Off-Loading: Turn and reposition every 2 hours - stand for a few minutes at least every hour during the day WOUND #14: - Gluteus Wound Laterality: Left Cleanser: Soap and Water 1 x Per Day/30 Days Discharge Instructions: May shower and wash wound with dial antibacterial soap and water prior to dressing change. Prim Dressing: KerraCel Ag Gelling Fiber Dressing, 2x2 in (silver alginate) 1 x Per Day/30 Days ary Discharge Instructions: Apply silver alginate to wound bed as instructed Secondary Dressing: ALLEVYN Gentle Border, 4x4 (in/in) 1 x Per Day/30 Days Discharge Instructions: Apply over primary dressing as directed. 01/11/2022: The wound is a little bit smaller today but has a very friable surface. It is an odd wound. On further inspection, it is almost as if there are dilated vessels and the underlying skin as I can compress the surrounding tissue, it dents in, and then fills back up, as if with fluid or blood. The surface is clean. The surface was very friable today. No debridement was necessary but I did treat the  area with silver nitrate as chemical cauterization. I am loath to  biopsy the site as I feel certain I would end up with bleeding that I may not be able to control in our office setting. We will continue using silver alginate and a foam border dressing. Follow-up in 2 weeks. Electronic Signature(s) Signed: 01/11/2022 1:30:31 PM By: Katrina Maudlin MD FACS Previous Signature: 01/11/2022 1:25:37 PM Version By: Katrina Maudlin MD FACS Entered By: Katrina [name] on 01/11/2022 13:30:31 -------------------------------------------------------------------------------- HxROS Details Patient Name: Date of Service: Katrina Amel D. 01/11/2022 12:30 PM Medical Record Number: [id number] Patient Account Number: [id number] Date of Birth/Sex: Treating RN: April 10, 1932 (88 y.o. [name] Primary Care Provider: [name] Other Clinician: Referring Provider: Treating Provider/Extender: Kathlyn Sacramento in Treatment: 2 Information Obtained From Patient Eyes Medical History: Negative for: Cataracts; Glaucoma; Optic Neuritis Ear/Nose/Mouth/Throat Medical History: Negative for: Chronic sinus problems/congestion; Middle ear problems Hematologic/Lymphatic Medical History: Negative for: Anemia; Hemophilia; Human Immunodeficiency Virus; Lymphedema; Sickle Cell Disease Respiratory Medical History: Positive for: Sleep Apnea - does not use Cpap or 02 as ordered Negative for: Aspiration; Asthma; Chronic Obstructive Pulmonary Disease (COPD); Pneumothorax; Tuberculosis Cardiovascular Medical History: Positive for: Arrhythmia - A-Fib; Hypertension; Phlebitis Negative for: Angina; Congestive Heart Failure; Coronary Artery Disease; Deep Vein Thrombosis; Hypotension; Myocardial Infarction; Peripheral Arterial Disease; Peripheral Venous Disease; Vasculitis Past Medical History Notes: Hyperlipidemia Gastrointestinal Medical History: Negative for: Cirrhosis ; Colitis; Crohns; Hepatitis A; Hepatitis B; Hepatitis C Endocrine Medical  History: Negative for: Type I Diabetes; Type II Diabetes Past Medical History Notes: Hypothyroidism Genitourinary Medical History: Negative for: End Stage Renal Disease Immunological Medical History: Negative for: Lupus Erythematosus; Raynauds; Scleroderma Integumentary (Skin) Medical History: Negative for: History of Burn Musculoskeletal Medical History: Negative for: Gout; Rheumatoid Arthritis; Osteoarthritis; Osteomyelitis Neurologic Medical History: Negative for: Dementia; Neuropathy; Quadriplegia; Paraplegia; Seizure Disorder Oncologic Medical History: Negative for: Received Chemotherapy; Received Radiation Psychiatric Medical History: Negative for: Anorexia/bulimia; Confinement Anxiety Immunizations Pneumococcal Vaccine: Received Pneumococcal Vaccination: Yes Received Pneumococcal Vaccination On or After 60th Birthday: No Implantable Devices None Family and Social History Cancer: Yes - Siblings; Heart Disease: Yes - Mother,Father; Hereditary Spherocytosis: No; Hypertension: Yes - Father; Kidney Disease: No; Lung Disease: No; Seizures: No; Stroke: No; Thyroid Problems: No; Tuberculosis: No; Never smoker; Marital Status - Single; Alcohol Use: Never; Drug Use: No History; Caffeine Use: Rarely; Financial Concerns: No; Food, Clothing or Shelter Needs: No; Support System Lacking: No; Transportation Concerns: No Engineer, maintenance) Signed: 01/11/2022 1:54:44 PM By: Katrina Maudlin MD FACS Signed: 01/11/2022 4:58:18 PM By: Baruch Gouty RN, BSN Entered By: Katrina [name] on 01/11/2022 13:24:09 -------------------------------------------------------------------------------- SuperBill Details Patient Name: Date of Service: Katrina Amel D. 01/11/2022 Medical Record Number: [id number] Patient Account Number: [id number] Date of Birth/Sex: Treating RN: 11-25-1931 (88 y.o. [name] Primary Care Provider: [name] Other Clinician: Referring  Provider: Treating Provider/Extender: Kathlyn Sacramento in Treatment: 2 Diagnosis Coding ICD-10 Codes Code Description [phone] Pressure ulcer of left buttock, stage 2 I87.2 Venous insufficiency (chronic) (peripheral) I10 Essential (primary) hypertension Facility Procedures CPT4 Code: [id number] 1 Description: 6387 - CHEM CAUT GRANULATION TISS ICD-10 Diagnosis Description L89.322 Pressure ulcer of left buttock, stage 2 Modifier: Quantity: 1 Physician Procedures : CPT4 Code Description Modifier [id number] - WC PHYS LEVEL 3 - EST PT ICD-10 Diagnosis Description L89.322 Pressure ulcer of left buttock, stage 2 I87.2 Venous insufficiency (chronic) (peripheral) I10 Essential (primary) hypertension Quantity: 1 : [id number] - WC PHYS CHEM CAUT GRAN TISSUE ICD-10 Diagnosis Description  E09.233 Pressure ulcer of left buttock, stage 2 Quantity: 1 Electronic Signature(s) Signed: 01/11/2022 1:30:45 PM By: Katrina Maudlin MD FACS Entered By: Katrina [name] on 01/11/2022 13:30:44

## 2022-01-11 NOTE — Progress Notes (Signed)
Katrina [name], Katrina [name] ([id number]) Visit Report for 01/11/2022 Arrival Information Details Patient Name: Date of Service: Katrina [name], Katrina [name] 01/11/2022 12:30 PM Medical Record Number: [id number] Patient Account Number: [id number] Date of Birth/Sex: Treating RN: 09/07/1931 (88 y.o. [name] Primary Care Donielle Kaigler: [name] Other Clinician: Referring Na Waldrip: Treating Esli Jernigan/Extender: Kathlyn Sacramento in Treatment: 2 Visit Information History Since Last Visit Added or deleted any medications: No Patient Arrived: Katrina [name] Any new allergies or adverse reactions: No Arrival Time: 12:35 Had a fall or experienced change in No Accompanied By: self activities of daily living that may affect Transfer Assistance: None risk of falls: Patient Identification Verified: Yes Signs or symptoms of abuse/neglect since last visito No Secondary Verification Process Completed: Yes Hospitalized since last visit: No Patient Requires Transmission-Based Precautions: No Implantable device outside of the clinic excluding No Patient Has Alerts: No cellular tissue based products placed in the center since last visit: Has Dressing in Place as Prescribed: Yes Pain Present Now: Yes Electronic Signature(s) Signed: 01/11/2022 4:58:18 PM By: Baruch Gouty RN, BSN Entered By: Baruch Gouty on 01/11/2022 12:40:28 -------------------------------------------------------------------------------- Encounter Discharge Information Details Patient Name: Date of Service: Katrina Amel D. 01/11/2022 12:30 PM Medical Record Number: [id number] Patient Account Number: [id number] Date of Birth/Sex: Treating RN: November 14, 1931 (88 y.o. [name] Primary Care Kohlton Gilpatrick: [name] Other Clinician: Referring Allix Blomquist: Treating Dmya Long/Extender: Kathlyn Sacramento in Treatment: 2 Encounter Discharge Information Items Discharge Condition: Stable Ambulatory Status:  Cane Discharge Destination: Other (Note Required) Telephoned: No Orders Sent: Yes Transportation: Other Accompanied By: self Schedule Follow-up Appointment: Yes Clinical Summary of Care: Patient Declined Notes ALF, facility transportation Electronic Signature(s) Signed: 01/11/2022 4:58:18 PM By: Baruch Gouty RN, BSN Entered By: Baruch Gouty on 01/11/2022 13:14:41 -------------------------------------------------------------------------------- Lower Extremity Assessment Details Patient Name: Date of Service: Katrina Amel D. 01/11/2022 12:30 PM Medical Record Number: [id number] Patient Account Number: [id number] Date of Birth/Sex: Treating RN: 11-16-1931 (88 y.o. [name] Primary Care Emryn Flanery: [name] Other Clinician: Referring Kenadie Royce: Treating Elandra Powell/Extender: Kathlyn Sacramento in Treatment: 2 Electronic Signature(s) Signed: 01/11/2022 4:58:18 PM By: Baruch Gouty RN, BSN Entered By: Baruch Gouty on 01/11/2022 12:47:53 -------------------------------------------------------------------------------- Multi Wound Chart Details Patient Name: Date of Service: Katrina Amel D. 01/11/2022 12:30 PM Medical Record Number: [id number] Patient Account Number: [id number] Date of Birth/Sex: Treating RN: 07-19-1932 (88 y.o. [name] Primary Care Yusif Gnau: [name] Other Clinician: Referring Ysmael Hires: Treating Daylynn Stumpp/Extender: Kathlyn Sacramento in Treatment: 2 Vital Signs Height(in): Pulse(bpm): 65 Weight(lbs): Blood Pressure(mmHg): 148/79 Body Mass Index(BMI): Temperature(F): 98.5 Respiratory Rate(breaths/min): 18 Photos: [N/A:N/A] Left Gluteus N/A N/A Wound Location: Pressure Injury N/A N/A Wounding Event: Pressure Ulcer N/A N/A Primary Etiology: Sleep Apnea, Arrhythmia, N/A N/A Comorbid History: Hypertension, Phlebitis 08/23/2021 N/A N/A Date Acquired: 2 N/A N/A Weeks of  Treatment: Open N/A N/A Wound Status: No N/A N/A Wound Recurrence: 0.5x0.6x0.1 N/A N/A Measurements L x W x D (cm) 0.236 N/A N/A A (cm) : rea 0.024 N/A N/A Volume (cm) : 81.20% N/A N/A % Reduction in A rea: 90.40% N/A N/A % Reduction in Volume: Category/Stage III N/A N/A Classification: Medium N/A N/A Exudate A mount: Serosanguineous N/A N/A Exudate Type: red, brown N/A N/A Exudate Color: Thickened N/A N/A Wound Margin: Large (67-100%) N/A N/A Granulation A mount: Red, Friable N/A N/A Granulation Quality: None Present (0%) N/A N/A Necrotic Amount: Fat Layer (Subcutaneous Tissue): Yes N/A N/A Exposed Structures: Fascia: No Tendon: No Muscle: No Joint:  No Bone: No Small (1-33%) N/A N/A Epithelialization: Chemical Cauterization N/A N/A Procedures Performed: Treatment Notes Electronic Signature(s) Signed: 01/11/2022 1:07:52 PM By: Fredirick Maudlin MD FACS Signed: 01/11/2022 4:58:18 PM By: Baruch Gouty RN, BSN Entered By: Fredirick Maudlin on 01/11/2022 13:07:52 -------------------------------------------------------------------------------- Multi-Disciplinary Care Plan Details Patient Name: Date of Service: Katrina Amel D. 01/11/2022 12:30 PM Medical Record Number: [id number] Patient Account Number: [id number] Date of Birth/Sex: Treating RN: 1932-02-09 (88 y.o. [name] Primary Care Jaslene Marsteller: [name] Other Clinician: Referring Deuntae Kocsis: Treating Cesar Rogerson/Extender: Kathlyn Sacramento in Treatment: 2 Multidisciplinary Care Plan reviewed with physician Active Inactive Abuse / Safety / Falls / Self Care Management Nursing Diagnoses: Potential for falls Goals: Patient/caregiver will verbalize/demonstrate measures taken to prevent injury and/or falls Date Initiated: 12/28/2021 Target Resolution Date: 01/25/2022 Goal Status: Active Interventions: Assess fall risk on admission and as needed Assess impairment of mobility  on admission and as needed per policy Notes: Nutrition Nursing Diagnoses: Imbalanced nutrition Potential for alteratiion in Nutrition/Potential for imbalanced nutrition Goals: Patient/caregiver agrees to and verbalizes understanding of need to use nutritional supplements and/or vitamins as prescribed Date Initiated: 12/28/2021 Target Resolution Date: 01/25/2022 Goal Status: Active Interventions: Assess patient nutrition upon admission and as needed per policy Provide education on nutrition Treatment Activities: Patient referred to Primary Care Physician for further nutritional evaluation : 12/28/2021 Notes: Pressure Nursing Diagnoses: Knowledge deficit related to causes and risk factors for pressure ulcer development Knowledge deficit related to management of pressures ulcers Potential for impaired tissue integrity related to pressure, friction, moisture, and shear Goals: Patient/caregiver will verbalize understanding of pressure ulcer management Date Initiated: 12/28/2021 Target Resolution Date: 01/25/2022 Goal Status: Active Interventions: Assess: immobility, friction, shearing, incontinence upon admission and as needed Assess potential for pressure ulcer upon admission and as needed Notes: Wound/Skin Impairment Nursing Diagnoses: Impaired tissue integrity Knowledge deficit related to ulceration/compromised skin integrity Goals: Patient/caregiver will verbalize understanding of skin care regimen Date Initiated: 12/28/2021 Target Resolution Date: 01/25/2022 Goal Status: Active Ulcer/skin breakdown will have a volume reduction of 30% by week 4 Date Initiated: 12/28/2021 Target Resolution Date: 01/25/2022 Goal Status: Active Interventions: Assess patient/caregiver ability to obtain necessary supplies Assess patient/caregiver ability to perform ulcer/skin care regimen upon admission and as needed Assess ulceration(s) every visit Provide education on ulcer and skin care Treatment  Activities: Skin care regimen initiated : 12/28/2021 Topical wound management initiated : 12/28/2021 Notes: Electronic Signature(s) Signed: 01/11/2022 4:58:18 PM By: Baruch Gouty RN, BSN Entered By: Baruch Gouty on 01/11/2022 12:54:58 -------------------------------------------------------------------------------- Pain Assessment Details Patient Name: Date of Service: Katrina Amel D. 01/11/2022 12:30 PM Medical Record Number: [id number] Patient Account Number: [id number] Date of Birth/Sex: Treating RN: [birth year] (88 y.o. [name] Primary Care Cyrene Gharibian: [name] Other Clinician: Referring Alonie Gazzola: Treating Lamario Mani/Extender: Kathlyn Sacramento in Treatment: 2 Active Problems Location of Pain Severity and Description of Pain Patient Has Paino Yes Site Locations Pain Location: Pain Location: Pain in Ulcers With Dressing Change: Yes Duration of the Pain. Constant / Intermittento Intermittent Rate the pain. Current Pain Level: 1 Worst Pain Level: 3 Least Pain Level: 0 Character of Pain Describe the Pain: Tender Pain Management and Medication Current Pain Management: Other: reposition Is the Current Pain Management Adequate: Adequate How does your wound impact your activities of daily livingo Sleep: No Bathing: No Appetite: No Relationship With Others: No Bladder Continence: No Emotions: No Bowel Continence: No Work: No Toileting: No Drive: No Dressing: No Hobbies: No Electronic Signature(s) Signed: 01/11/2022 4:58:18  PM By: Baruch Gouty RN, BSN Entered By: Baruch Gouty on 01/11/2022 12:47:46 -------------------------------------------------------------------------------- Patient/Caregiver Education Details Patient Name: Date of Service: Katrina Amel D. 5/22/2023andnbsp12:30 PM Medical Record Number: [id number] Patient Account Number: [id number] Date of Birth/Gender: Treating RN: [birth year] (88 y.o. [name] Primary Care Physician: [name] Other Clinician: Referring Physician: Treating Physician/Extender: Kathlyn [place] in Treatment: 2 Education Assessment Education Provided To: Patient Education Topics Provided Pressure: Methods: Explain/Verbal Responses: Reinforcements needed, State content correctly Wound/Skin Impairment: Methods: Explain/Verbal Responses: Reinforcements needed, State content correctly Electronic Signature(s) Signed: 01/11/2022 4:58:18 PM By: Baruch Gouty RN, BSN Signed: 01/11/2022 4:58:18 PM By: Baruch Gouty RN, BSN Entered By: Baruch Gouty on 01/11/2022 12:55:20 -------------------------------------------------------------------------------- Wound Assessment Details Patient Name: Date of Service: Katrina Amel D. 01/11/2022 12:30 PM Medical Record Number: [id number] Patient Account Number: [id number] Date of Birth/Sex: Treating RN: 22-Sep-1931 (88 y.o. [name] Primary Care Tyquon Near: [name] Other Clinician: Referring Keiasia Christianson: Treating Laela Deviney/Extender: Kathlyn [place] in Treatment: 2 Wound Status Wound Number: 14 Primary Etiology: Pressure Ulcer Wound Location: Left Gluteus Wound Status: Open Wounding Event: Pressure Injury Comorbid History: Sleep Apnea, Arrhythmia, Hypertension, Phlebitis Date Acquired: 08/23/2021 Weeks Of Treatment: 2 Clustered Wound: No Photos Wound Measurements Length: (cm) 0.5 Width: (cm) 0.6 Depth: (cm) 0.1 Area: (cm) 0.236 Volume: (cm) 0.024 % Reduction in Area: 81.2% % Reduction in Volume: 90.4% Epithelialization: Small (1-33%) Tunneling: No Undermining: No Wound Description Classification: Category/Stage III Wound Margin: Thickened Exudate Amount: Medium Exudate Type: Serosanguineous Exudate Color: red, brown Foul Odor After Cleansing: No Slough/Fibrino No Wound Bed Granulation Amount: Large (67-100%) Exposed  Structure Granulation Quality: Red, Friable Fascia Exposed: No Necrotic Amount: None Present (0%) Fat Layer (Subcutaneous Tissue) Exposed: Yes Tendon Exposed: No Muscle Exposed: No Joint Exposed: No Bone Exposed: No Treatment Notes Wound #14 (Gluteus) Wound Laterality: Left Cleanser Soap and Water Discharge Instruction: May shower and wash wound with dial antibacterial soap and water prior to dressing change. Peri-Wound Care Topical Primary Dressing KerraCel Ag Gelling Fiber Dressing, 2x2 in (silver alginate) Discharge Instruction: Apply silver alginate to wound bed as instructed Secondary Dressing ALLEVYN Gentle Border, 4x4 (in/in) Discharge Instruction: Apply over primary dressing as directed. Secured With Compression Wrap Compression Stockings Environmental education officer) Signed: 01/11/2022 4:58:18 PM By: Baruch Gouty RN, BSN Entered By: Baruch Gouty on 01/11/2022 12:54:31 -------------------------------------------------------------------------------- Vitals Details Patient Name: Date of Service: Katrina Amel D. 01/11/2022 12:30 PM Medical Record Number: [id number] Patient Account Number: [id number] Date of Birth/Sex: Treating RN: Mar 20, 1932 (88 y.o. [name] Primary Care Dicie Edelen: [name] Other Clinician: Referring Daishawn Lauf: Treating Antonyo Hinderer/Extender: Kathlyn [place] in Treatment: 2 Vital Signs Time Taken: 12:40 Temperature (F): 98.5 Pulse (bpm): 65 Respiratory Rate (breaths/min): 18 Blood Pressure (mmHg): 148/79 Reference Range: 80 - 120 mg / dl Electronic Signature(s) Signed: 01/11/2022 4:58:18 PM By: Baruch Gouty RN, BSN Entered By: Baruch Gouty on 01/11/2022 12:40:52

## 2022-01-25 ENCOUNTER — Encounter (HOSPITAL_BASED_OUTPATIENT_CLINIC_OR_DEPARTMENT_OTHER): Payer: Medicare Other | Attending: General Surgery | Admitting: General Surgery

## 2022-01-25 DIAGNOSIS — I480 Paroxysmal atrial fibrillation: Secondary | ICD-10-CM | POA: Insufficient documentation

## 2022-01-25 DIAGNOSIS — G4730 Sleep apnea, unspecified: Secondary | ICD-10-CM | POA: Insufficient documentation

## 2022-01-25 DIAGNOSIS — Z7901 Long term (current) use of anticoagulants: Secondary | ICD-10-CM | POA: Insufficient documentation

## 2022-01-25 DIAGNOSIS — I890 Lymphedema, not elsewhere classified: Secondary | ICD-10-CM | POA: Insufficient documentation

## 2022-01-25 DIAGNOSIS — L89322 Pressure ulcer of left buttock, stage 2: Secondary | ICD-10-CM | POA: Diagnosis present

## 2022-01-25 DIAGNOSIS — I872 Venous insufficiency (chronic) (peripheral): Secondary | ICD-10-CM | POA: Diagnosis not present

## 2022-01-25 DIAGNOSIS — Z86711 Personal history of pulmonary embolism: Secondary | ICD-10-CM | POA: Insufficient documentation

## 2022-01-25 DIAGNOSIS — I10 Essential (primary) hypertension: Secondary | ICD-10-CM | POA: Insufficient documentation

## 2022-01-25 DIAGNOSIS — E785 Hyperlipidemia, unspecified: Secondary | ICD-10-CM | POA: Diagnosis not present

## 2022-01-25 DIAGNOSIS — E039 Hypothyroidism, unspecified: Secondary | ICD-10-CM | POA: Insufficient documentation

## 2022-01-25 NOTE — Progress Notes (Signed)
NEFTALY, [name] ([id number]) Visit Report for 01/25/2022 Arrival Information Details Patient Name: Date of Service: Katrina [name], Katrina [name] 01/25/2022 1:15 PM Medical Record Number: [id number] Patient Account Number: [id number] Date of Birth/Sex: Treating RN: [birth year] (88 y.o. [name] Primary Care Anyae Griffith: [name] Other Clinician: Referring Jeniece Hannis: Treating Orma Cheetham/Extender: Kathlyn [place] in Treatment: 4 Visit Information History Since Last Visit Added or deleted any medications: No Patient Arrived: [name] Any new allergies or adverse reactions: No Arrival Time: 13:36 Had a fall or experienced change in No Accompanied By: self activities of daily living that may affect Transfer Assistance: None risk of falls: Patient Identification Verified: Yes Signs or symptoms of abuse/neglect since last visito No Secondary Verification Process Completed: Yes Hospitalized since last visit: No Patient Requires Transmission-Based Precautions: No Implantable device outside of the clinic excluding No Patient Has Alerts: No cellular tissue based products placed in the center since last visit: Has Dressing in Place as Prescribed: Yes Pain Present Now: Yes Electronic Signature(s) Signed: 01/25/2022 5:13:49 PM By: Baruch Gouty RN, BSN Entered By: Baruch Gouty on 01/25/2022 13:41:44 -------------------------------------------------------------------------------- Clinic Level of Care Assessment Details Patient Name: Date of Service: Katrina Amel D. 01/25/2022 1:15 PM Medical Record Number: [id number] Patient Account Number: [id number] Date of Birth/Sex: Treating RN: 22-Jul-1932 (88 y.o. [name] Primary Care Lianni Kanaan: [name] Other Clinician: Referring Floree Zuniga: Treating Dawud Mays/Extender: Kathlyn [place] in Treatment: 4 Clinic Level of Care Assessment Items TOOL 4 Quantity Score '[]'  - 0 Use when only an EandM is  performed on FOLLOW-UP visit ASSESSMENTS - Nursing Assessment / Reassessment X- 1 10 Reassessment of Co-morbidities (includes updates in patient status) X- 1 5 Reassessment of Adherence to Treatment Plan ASSESSMENTS - Wound and Skin A ssessment / Reassessment X - Simple Wound Assessment / Reassessment - one wound 1 5 '[]'  - 0 Complex Wound Assessment / Reassessment - multiple wounds '[]'  - 0 Dermatologic / Skin Assessment (not related to wound area) ASSESSMENTS - Focused Assessment '[]'  - 0 Circumferential Edema Measurements - multi extremities '[]'  - 0 Nutritional Assessment / Counseling / Intervention '[]'  - 0 Lower Extremity Assessment (monofilament, tuning fork, pulses) '[]'  - 0 Peripheral Arterial Disease Assessment (using hand held doppler) ASSESSMENTS - Ostomy and/or Continence Assessment and Care '[]'  - 0 Incontinence Assessment and Management '[]'  - 0 Ostomy Care Assessment and Management (repouching, etc.) PROCESS - Coordination of Care X - Simple Patient / Family Education for ongoing care 1 15 '[]'  - 0 Complex (extensive) Patient / Family Education for ongoing care X- 1 10 Staff obtains Programmer, systems, Records, T Results / Process Orders est X- 1 10 Staff telephones HHA, Nursing Homes / Clarify orders / etc '[]'  - 0 Routine Transfer to another Facility (non-emergent condition) '[]'  - 0 Routine Hospital Admission (non-emergent condition) '[]'  - 0 New Admissions / Biomedical engineer / Ordering NPWT Apligraf, etc. , '[]'  - 0 Emergency Hospital Admission (emergent condition) X- 1 10 Simple Discharge Coordination '[]'  - 0 Complex (extensive) Discharge Coordination PROCESS - Special Needs '[]'  - 0 Pediatric / Minor Patient Management '[]'  - 0 Isolation Patient Management '[]'  - 0 Hearing / Language / Visual special needs '[]'  - 0 Assessment of Community assistance (transportation, D/C planning, etc.) '[]'  - 0 Additional assistance / Altered mentation '[]'  - 0 Support Surface(s) Assessment  (bed, cushion, seat, etc.) INTERVENTIONS - Wound Cleansing / Measurement X - Simple Wound Cleansing - one wound 1 5 '[]'  - 0 Complex Wound Cleansing - multiple wounds  X- 1 5 Wound Imaging (photographs - any number of wounds) '[]'  - 0 Wound Tracing (instead of photographs) X- 1 5 Simple Wound Measurement - one wound '[]'  - 0 Complex Wound Measurement - multiple wounds INTERVENTIONS - Wound Dressings X - Small Wound Dressing one or multiple wounds 1 10 '[]'  - 0 Medium Wound Dressing one or multiple wounds '[]'  - 0 Large Wound Dressing one or multiple wounds '[]'  - 0 Application of Medications - topical '[]'  - 0 Application of Medications - injection INTERVENTIONS - Miscellaneous '[]'  - 0 External ear exam '[]'  - 0 Specimen Collection (cultures, biopsies, blood, body fluids, etc.) '[]'  - 0 Specimen(s) / Culture(s) sent or taken to Lab for analysis '[]'  - 0 Patient Transfer (multiple staff / Civil Service fast streamer / Similar devices) '[]'  - 0 Simple Staple / Suture removal (25 or less) '[]'  - 0 Complex Staple / Suture removal (26 or more) '[]'  - 0 Hypo / Hyperglycemic Management (close monitor of Blood Glucose) '[]'  - 0 Ankle / Brachial Index (ABI) - do not check if billed separately X- 1 5 Vital Signs Has the patient been seen at the hospital within the last three years: Yes Total Score: 95 Level Of Care: New/Established - Level 3 Electronic Signature(s) Signed: 01/25/2022 5:13:49 PM By: Baruch Gouty RN, BSN Entered By: Baruch Gouty on 01/25/2022 14:01:00 -------------------------------------------------------------------------------- Encounter Discharge Information Details Patient Name: Date of Service: Katrina Amel D. 01/25/2022 1:15 PM Medical Record Number: [id number] Patient Account Number: [id number] Date of Birth/Sex: Treating RN: [birth year] (88 y.o. [name] Primary Care Katrina [name]: [name] Other Clinician: Referring Dorrian Doggett: Treating Jan Walters/Extender: Kathlyn [place] in Treatment: 4 Encounter Discharge Information Items Discharge Condition: Stable Ambulatory Status: Cane Discharge Destination: Home Transportation: Other Accompanied By: self Schedule Follow-up Appointment: Yes Clinical Summary of Care: Patient Declined Notes facility transportation Electronic Signature(s) Signed: 01/25/2022 5:13:49 PM By: Baruch Gouty RN, BSN Entered By: Baruch Gouty on 01/25/2022 14:07:20 -------------------------------------------------------------------------------- Lower Extremity Assessment Details Patient Name: Date of Service: Katrina Amel D. 01/25/2022 1:15 PM Medical Record Number: [id number] Patient Account Number: [id number] Date of Birth/Sex: Treating RN: 02/11/1932 (88 y.o. [name] Primary Care Tinsley Everman: [name] Other Clinician: Referring Lavaughn Bisig: Treating Shelitha Magley/Extender: Kathlyn [place] in Treatment: 4 Electronic Signature(s) Signed: 01/25/2022 5:13:49 PM By: Baruch Gouty RN, BSN Entered By: Baruch Gouty on 01/25/2022 13:46:05 -------------------------------------------------------------------------------- Multi Wound Chart Details Patient Name: Date of Service: Katrina Amel D. 01/25/2022 1:15 PM Medical Record Number: [id number] Patient Account Number: [id number] Date of Birth/Sex: Treating RN: 05-22-1932 (88 y.o. [name] Primary Care Jazman Reuter: [name] Other Clinician: Referring Myleen Brailsford: Treating Ayssa Bentivegna/Extender: Kathlyn [place] in Treatment: 4 Vital Signs Height(in): Pulse(bpm): 39 Weight(lbs): Blood Pressure(mmHg): 157/73 Body Mass Index(BMI): Temperature(F): 98.2 Respiratory Rate(breaths/min): 18 Photos: [N/A:N/A] Left Gluteus N/A N/A Wound Location: Pressure Injury N/A N/A Wounding Event: Pressure Ulcer N/A N/A Primary Etiology: Sleep Apnea, Arrhythmia, N/A N/A Comorbid History: Hypertension,  Phlebitis 08/23/2021 N/A N/A Date Acquired: 4 N/A N/A Weeks of Treatment: Open N/A N/A Wound Status: No N/A N/A Wound Recurrence: 0.7x0.4x0.3 N/A N/A Measurements L x W x D (cm) 0.22 N/A N/A A (cm) : rea 0.066 N/A N/A Volume (cm) : 82.50% N/A N/A % Reduction in A rea: 73.70% N/A N/A % Reduction in Volume: Category/Stage III N/A N/A Classification: Medium N/A N/A Exudate A mount: Serosanguineous N/A N/A Exudate Type: red, brown N/A N/A Exudate Color: Epibole N/A N/A Wound Margin: Large (67-100%) N/A N/A  Granulation A mount: Red, Friable N/A N/A Granulation Quality: None Present (0%) N/A N/A Necrotic A mount: Fat Layer (Subcutaneous Tissue): Yes N/A N/A Exposed Structures: Fascia: No Tendon: No Muscle: No Joint: No Bone: No Small (1-33%) N/A N/A Epithelialization: Treatment Notes Wound #14 (Gluteus) Wound Laterality: Left Cleanser Soap and Water Discharge Instruction: May shower and wash wound with dial antibacterial soap and water prior to dressing change. Peri-Wound Care Topical Primary Dressing KerraCel Ag Gelling Fiber Dressing, 2x2 in (silver alginate) Discharge Instruction: Apply silver alginate to wound bed as instructed Secondary Dressing ALLEVYN Gentle Border, 4x4 (in/in) Discharge Instruction: Apply over primary dressing as directed. Secured With Compression Wrap Compression Stockings Environmental education officer) Signed: 01/25/2022 2:45:48 PM By: Fredirick Maudlin MD FACS Signed: 01/25/2022 5:13:49 PM By: Baruch Gouty RN, BSN Entered By: Fredirick Maudlin on 01/25/2022 14:45:48 -------------------------------------------------------------------------------- Multi-Disciplinary Care Plan Details Patient Name: Date of Service: Katrina Amel D. 01/25/2022 1:15 PM Medical Record Number: [id number] Patient Account Number: [id number] Date of Birth/Sex: Treating RN: March 29, 1932 (88 y.o. [name] Primary Care Alyas Creary: [name] Other Clinician: Referring Kainon Varady: Treating Jaicion Laurie/Extender: Kathlyn [place] in Treatment: [place] reviewed with physician Active Inactive Abuse / Safety / Falls / Self Care Management Nursing Diagnoses: Potential for falls Goals: Patient/caregiver will verbalize/demonstrate measures taken to prevent injury and/or falls Date Initiated: 12/28/2021 Target Resolution Date: 02/22/2022 Goal Status: Active Interventions: Assess fall risk on admission and as needed Assess impairment of mobility on admission and as needed per policy Notes: Nutrition Nursing Diagnoses: Imbalanced nutrition Potential for alteratiion in Nutrition/Potential for imbalanced nutrition Goals: Patient/caregiver agrees to and verbalizes understanding of need to use nutritional supplements and/or vitamins as prescribed Date Initiated: 12/28/2021 Target Resolution Date: 02/22/2022 Goal Status: Active Interventions: Assess patient nutrition upon admission and as needed per policy Provide education on nutrition Treatment Activities: Patient referred to Primary Care Physician for further nutritional evaluation : 12/28/2021 Notes: Pressure Nursing Diagnoses: Knowledge deficit related to causes and risk factors for pressure ulcer development Knowledge deficit related to management of pressures ulcers Potential for impaired tissue integrity related to pressure, friction, moisture, and shear Goals: Patient/caregiver will verbalize understanding of pressure ulcer management Date Initiated: 12/28/2021 Target Resolution Date: 02/22/2022 Goal Status: Active Interventions: Assess: immobility, friction, shearing, incontinence upon admission and as needed Assess potential for pressure ulcer upon admission and as needed Notes: Wound/Skin Impairment Nursing Diagnoses: Impaired tissue integrity Knowledge deficit related to ulceration/compromised skin  integrity Goals: Patient/caregiver will verbalize understanding of skin care regimen Date Initiated: 12/28/2021 Target Resolution Date: 02/22/2022 Goal Status: Active Ulcer/skin breakdown will have a volume reduction of 30% by week 4 Date Initiated: 12/28/2021 Date Inactivated: 01/25/2022 Target Resolution Date: 01/25/2022 Goal Status: Met Ulcer/skin breakdown will have a volume reduction of 50% by week 8 Date Initiated: 01/25/2022 Target Resolution Date: 02/22/2022 Goal Status: Active Interventions: Assess patient/caregiver ability to obtain necessary supplies Assess patient/caregiver ability to perform ulcer/skin care regimen upon admission and as needed Assess ulceration(s) every visit Provide education on ulcer and skin care Treatment Activities: Skin care regimen initiated : 12/28/2021 Topical wound management initiated : 12/28/2021 Notes: Electronic Signature(s) Signed: 01/25/2022 5:13:49 PM By: Baruch Gouty RN, BSN Entered By: Baruch Gouty on 01/25/2022 13:55:59 -------------------------------------------------------------------------------- Pain Assessment Details Patient Name: Date of Service: Katrina Amel D. 01/25/2022 1:15 PM Medical Record Number: [id number] Patient Account Number: [id number] Date of Birth/Sex: Treating RN: 07-15-1932 (88 y.o. [name] Primary Care Markeya Mincy: [name] Other Clinician: Referring Jess Sulak: Treating  Keyshon Stein/Extender: Kathlyn [place] in Treatment: 4 Active Problems Location of Pain Severity and Description of Pain Patient Has Paino Yes Site Locations Pain Location: Pain Location: Pain in Ulcers With Dressing Change: Yes Duration of the Pain. Constant / Intermittento Intermittent Rate the pain. Current Pain Level: 0 Worst Pain Level: 4 Least Pain Level: 0 Character of Pain Describe the Pain: Aching Pain Management and Medication Current Pain Management: Other: reposition Is the Current Pain  Management Adequate: Adequate How does your wound impact your activities of daily livingo Sleep: Yes Bathing: No Appetite: No Relationship With Others: No Bladder Continence: No Emotions: No Bowel Continence: No Work: No Toileting: No Drive: No Dressing: No Hobbies: No Electronic Signature(s) Signed: 01/25/2022 5:13:49 PM By: Baruch Gouty RN, BSN Entered By: Baruch Gouty on 01/25/2022 13:42:41 -------------------------------------------------------------------------------- Patient/Caregiver Education Details Patient Name: Date of Service: [place] 6/5/2023andnbsp1:15 PM Medical Record Number: [id number] Patient Account Number: [id number] Date of Birth/Gender: Treating RN: 1932/03/09 (88 y.o. [name] Primary Care Physician: [name] Other Clinician: Referring Physician: Treating Physician/Extender: Kathlyn [place] in Treatment: 4 Education Assessment Education Provided To: Patient Education Topics Provided Pressure: Methods: Explain/Verbal Responses: Reinforcements needed, State content correctly Wound/Skin Impairment: Methods: Explain/Verbal Responses: Reinforcements needed, State content correctly Electronic Signature(s) Signed: 01/25/2022 5:13:49 PM By: Baruch Gouty RN, BSN Signed: 01/25/2022 5:13:49 PM By: Baruch Gouty RN, BSN Entered By: Baruch Gouty on 01/25/2022 13:56:20 -------------------------------------------------------------------------------- Wound Assessment Details Patient Name: Date of Service: Katrina Amel D. 01/25/2022 1:15 PM Medical Record Number: [id number] Patient Account Number: [id number] Date of Birth/Sex: Treating RN: June 27, 1932 (88 y.o. [name] Primary Care [place]: [name] Other Clinician: Referring Maisee Vollman: Treating Kinisha Soper/Extender: Kathlyn [place] in Treatment: 4 Wound Status Wound Number: 14 Primary Etiology: Pressure  Ulcer Wound Location: Left Gluteus Wound Status: Open Wounding Event: Pressure Injury Comorbid History: Sleep Apnea, Arrhythmia, Hypertension, Phlebitis Date Acquired: 08/23/2021 Weeks Of Treatment: 4 Clustered Wound: No Photos Wound Measurements Length: (cm) 0.7 Width: (cm) 0.4 Depth: (cm) 0.3 Area: (cm) 0.22 Volume: (cm) 0.066 % Reduction in Area: 82.5% % Reduction in Volume: 73.7% Epithelialization: Small (1-33%) Tunneling: No Undermining: No Wound Description Classification: Category/Stage III Wound Margin: Epibole Exudate Amount: Medium Exudate Type: Serosanguineous Exudate Color: red, brown Foul Odor After Cleansing: No Slough/Fibrino No Wound Bed Granulation Amount: Large (67-100%) Exposed Structure Granulation Quality: Red, Friable Fascia Exposed: No Necrotic Amount: None Present (0%) Fat Layer (Subcutaneous Tissue) Exposed: Yes Tendon Exposed: No Muscle Exposed: No Joint Exposed: No Bone Exposed: No Treatment Notes Wound #14 (Gluteus) Wound Laterality: Left Cleanser Soap and Water Discharge Instruction: May shower and wash wound with dial antibacterial soap and water prior to dressing change. Peri-Wound Care Topical Primary Dressing KerraCel Ag Gelling Fiber Dressing, 2x2 in (silver alginate) Discharge Instruction: Apply silver alginate to wound bed as instructed Secondary Dressing ALLEVYN Gentle Border, 4x4 (in/in) Discharge Instruction: Apply over primary dressing as directed. Secured With Compression Wrap Compression Stockings Environmental education officer) Signed: 01/25/2022 5:13:49 PM By: Baruch Gouty RN, BSN Entered By: Baruch Gouty on 01/25/2022 13:51:58 -------------------------------------------------------------------------------- Vitals Details Patient Name: Date of Service: Katrina Amel D. 01/25/2022 1:15 PM Medical Record Number: [id number] Patient Account Number: [id number] Date of Birth/Sex: Treating RN: [birth year] (88 y.o. [name] Primary Care Fischer Halley: [name] Other Clinician: Referring Noell Lorensen: Treating Jaysin Gayler/Extender: Kathlyn [place] in Treatment: 4 Vital Signs Time Taken: 13:41 Temperature (F): 98.2 Pulse (bpm): 67 Respiratory Rate (breaths/min): 18 Blood  Pressure (mmHg): 157/73 Reference Range: 80 - 120 mg / dl Electronic Signature(s) Signed: 01/25/2022 5:13:49 PM By: Baruch Gouty RN, BSN Entered By: Baruch Gouty on 01/25/2022 13:42:10

## 2022-01-25 NOTE — Progress Notes (Signed)
Katrina, [name] ([id number]) Visit Report for 01/25/2022 Chief Complaint Document Details Patient Name: Date of Service: Katrina [name], Katrina [name] 01/25/2022 1:15 PM Medical Record Number: [id number] Patient Account Number: [id number] Date of Birth/Sex: Treating RN: 19-Mar-1932 (88 y.o. Katrina [name] Primary Care Provider: [name] Other Clinician: Referring Provider: Treating Provider/Extender: Kathlyn Sacramento in Treatment: 4 Information Obtained from: Patient Chief Complaint Patient returns to the wound care center for reopened ulcer to: Left buttock Electronic Signature(s) Signed: 01/25/2022 2:45:54 PM By: Fredirick Maudlin MD FACS Entered By: Fredirick Maudlin on 01/25/2022 14:45:54 -------------------------------------------------------------------------------- HPI Details Patient Name: Date of Service: Katrina Amel D. 01/25/2022 1:15 PM Medical Record Number: [id number] Patient Account Number: [id number] Date of Birth/Sex: Treating RN: 1932/07/19 (88 y.o. Katrina [name] Primary Care Provider: [name] Other Clinician: Referring Provider: Treating Provider/Extender: Kathlyn Sacramento in Treatment: 4 History of Present Illness Location: right lower extremity Quality: Patient reports experiencing a dull pain to affected area(s). Severity: Patient states wound(s) are getting better. Duration: Patient has had the wound for 3 months prior to presenting for treatment Context: The wound would happen gradually Modifying Factors: Patient wound(s)/ulcer(s) are worsening due to :standing for a long while. HPI Description: Past medical history of pulmonary emboli, hypothyroidism, hyperlipidemia, hypertension, sleep apnea, gallstones, ulcerated lower extremities, atrial fibrillation, status post total knee replacement, cholecystectomy, cystoscopy and stent placement for kidney stones. She has never been a smoker. About a year ago she was seen  by [name] with an ulcer of her right lower extremity which she's had for about over 3 months. On 10/23/14 -- Lower extremity venous duplex evaluation. She had no evidence of deep venous thromboses involving both lower extremities. There was evidence of deep vein reflux in the right and left common femoral veins. She also had evidence of great saphenous vein reflux noted only at the level of the saphenofemoral junction. No evidence of greater or small saphenous vein reflux. She will need a consult to the vascular surgeon. Seen by Dr. Mathis Fare office note dated 11/13/2014. He noted that she had good arterial blood flow with palpable DP pulses bilaterally. Based on the patient's history Dr. Scot Dock recommended elevation when at rest of both feet and knees and ankles above heart level in the supine position. Activity such as walking and pool exercises were recommended. She will also have 15-20 mm thigh-high compression stockings daily once the ulcers have completely healed. A prescription was given to her. 11/04/2015 -- the wound on her right lower extremity has completely healed and her edema has gone down significantly. However in her popliteal fossa where she's got a lot of fungal infection there is an area which is now an open wound and this will need attention. 12/02/2015 -- the right lower extremity looks much better than the edema has gone down significantly however she continues to have significant edema on the left side today. 12/09/2015 -- the patient has been doing her dressings as advised and says that overall she feels much better. She has received her juxta light for the left lower extremity READMISSION 06/25/2021 This is a patient who is actually had several previous visits to our clinic in 2014, 2016 and more recently 2017. This was largely because of wounds on her lower legs in the setting of chronic venous insufficiency and secondary lymphedema. Looking at my records she was apparently  prescribed thigh-high compression stockings probably by vein and vascular although she comes back in not having worn any stockings in  quite a period of time. She is now in the assisted living part of [facility]. She thinks her wounds have been there since April. This includes the left lateral, right anterior and right posterior lateral parts of both lower legs. She also has a stage II pressure ulcer on her left glued I think which is happened because of sleeping in a lift chair. She had ABDs and kerlix on her bilateral lower legs. Past medical history reviewed she has hypothyroidism, left buttock pressure ulcer dating back to March of this year, iron deficiency, falls, paroxysmal atrial fibrillation anticoagulated and chronic venous insufficiency ABIs in our clinic were 0.86 on the right and 0.96 on the left 11/10; patient comes into clinic today after being admitted last week with uncontrolled lymphedema, severe venous insufficiency and some superficial wounds on her bilateral lower legs. We put her in 3 layer compression and she comes in today with all the wounds closed and considerable improvement in the condition of her lower legs. The patient is going to need lifelong compression stockings probably juxta lites and we are going to go ahead and order those for her today. Unfortunately the POA here is a niece in [place] were probably going to have to talk with her to arrange proper payment for the juxta lite stockings. She is in the assisted living level of [facility]. She does not feel she can get the stockings on independently I am hopeful that the nurse and aids at the assisted living can help her with this otherwise this may be a problem. She also has an area on her left buttock which is a stage II wound. A lot of unhealthy irritated skin around this I think from moisture prolonged sittingo Incontinence 11/17; the patient's area on the left  buttock is just about closed although there is still a lot of unhealthy looking discolored skin around a large area here. I suspect this is chronic moisture, pressure, perhaps incontinence. she has minuscule open areas on her bilateral lower legs. Apparently our intermediary did not get the order for juxta lite stockings therefore she does not have any. 12/1; both leg wounds are healed and she has been wearing bilateral juxta lite stockings for 2 days. [facility] are [facility] her with this. The left buttock wound unfortunately has deteriorated 12/15; 2-week follow-up. Her legs remain healed she has the other area which is on her left buttock. This is surrounded circumferentially by chronically irritated skin. Mirror image on the right side as well. I suspect this is chronic pressure friction and moisture from incontinence. This makes it difficult to get this area to completely close. We have been using silver alginate 12/29 both her legs remain closed and she has bilateral juxta lite stockings. The real problem here is her left buttock she has a small wound in the mid part of a extremely irritated dermatitis. It almost looks bruised in spots. There is superficial loss of epithelium. The actual wound that we have been dealing with does not look so bad. I have been assuming this is some combination of friction pressure wetness from constant urinary incontinence. If there is a dermatologic issue outside of that here I am not seeing it 09/03/2021; patient with a small area on the left buttock. The real problem here is a marked surrounding erythematous dermatitis with loss of surface epithelium. It looks as though she is beginning to develop satellite lesions this may be mostly fungal 1/26; the area  on her left buttock is closed however she still has very irritated macerated skin in this area. Some of this appears fungal. I treated this with ketoconazole last time and I think it looks somewhat  better although there is still some area present that still looks like a fungal dermatitis. Everything on the right buttock looks a lot better. READMISSION 12/28/2021: The patient returns today with a reopening of a wound on her left buttock. She says it has been present for about 6 months; I am not sure how this is possible since she was seen for similar in January and subsequently discharged from the clinic. Nonetheless, she has some purplish discoloration of her buttock with an open area and some scarring around the perimeter of a shallow stage II ulcer on her buttock. According to the previous clinic notes, there was some question as to whether or not this was due to fungal breakdown of her skin. She currently resides at [facility], in the assisted living section. She says that she cannot see the site and is unable to care for it herself. 01/11/2022: The wound is a little bit smaller today but has a very friable surface. It is an odd wound. On further inspection, it is almost as if there are dilated vessels and the underlying skin as I can compress the surrounding tissue, it dents in, and then fills back up, as if with fluid or blood. She did have an episode during the week when she had a lot of blood come from the wound, but is not actively bleeding today. 01/25/2022: No significant change to the wound. She continues to have purple discoloration of her buttocks, but it does still blanch. Electronic Signature(s) Signed: 01/25/2022 2:46:34 PM By: Fredirick Maudlin MD FACS Entered By: Fredirick Maudlin on 01/25/2022 14:46:34 -------------------------------------------------------------------------------- Physical Exam Details Patient Name: Date of Service: Katrina Amel D. 01/25/2022 1:15 PM Medical Record Number: [id number] Patient Account Number: [id number] Date of Birth/Sex: Treating RN: 1932/04/21 (88 y.o. Katrina [name] Primary Care Provider: [name] Other Clinician: Referring  Provider: Treating Provider/Extender: Kathlyn Sacramento in Treatment: 4 Constitutional Slightly hypertensive. . . . No acute distress. Respiratory Normal work of breathing on room air. Notes 01/25/2022: No substantial change to the wound today. The surface remains a little bit friable and the edges are slightly heaped around the surface, but this is not epibole or other senescent tissue. Her buttocks are still discolored purple, but they blanch bilaterally. Electronic Signature(s) Signed: 01/25/2022 2:49:31 PM By: Fredirick Maudlin MD FACS Entered By: Fredirick Maudlin on 01/25/2022 14:49:31 -------------------------------------------------------------------------------- Physician Orders Details Patient Name: Date of Service: Katrina Amel D. 01/25/2022 1:15 PM Medical Record Number: [id number] Patient Account Number: [id number] Date of Birth/Sex: Treating RN: 24-Jan-1932 (88 y.o. Katrina [name] Primary Care Provider: [name] Other Clinician: Referring Provider: Treating Provider/Extender: Kathlyn Sacramento in Treatment: 4 Verbal / Phone Orders: No Diagnosis Coding ICD-10 Coding Code Description [phone] Pressure ulcer of left buttock, stage 2 I87.2 Venous insufficiency (chronic) (peripheral) I10 Essential (primary) hypertension Follow-up Appointments ppointment in 2 weeks. - Dr. Celine Ahr Rm 1 Return A Monday 6/19 @ 1:15 pm Bathing/ Shower/ Hygiene May shower and wash wound with soap and water. Off-Loading Turn and reposition every 2 hours - stand for a few minutes at least every hour during the day Wound Treatment Wound #14 - Gluteus Wound Laterality: Left Cleanser: Soap and Water 1 x Per Day/30 Days Discharge Instructions: May shower and wash wound with  dial antibacterial soap and water prior to dressing change. Prim Dressing: KerraCel Ag Gelling Fiber Dressing, 2x2 in (silver alginate) 1 x Per Day/30 Days ary Discharge  Instructions: Apply silver alginate to wound bed as instructed Secondary Dressing: ALLEVYN Gentle Border, 4x4 (in/in) 1 x Per Day/30 Days Discharge Instructions: Apply over primary dressing as directed. Electronic Signature(s) Signed: 01/25/2022 4:31:59 PM By: Fredirick Maudlin MD FACS Entered By: Fredirick Maudlin on 01/25/2022 14:49:57 -------------------------------------------------------------------------------- Problem List Details Patient Name: Date of Service: Katrina Amel D. 01/25/2022 1:15 PM Medical Record Number: [id number] Patient Account Number: [id number] Date of Birth/Sex: Treating RN: [birth year] (88 y.o. Katrina [name] Primary Care Provider: [name] Other Clinician: Referring Provider: Treating Provider/Extender: Kathlyn Sacramento in Treatment: 4 Active Problems ICD-10 Encounter Code Description Active Date MDM Diagnosis [phone] Pressure ulcer of left buttock, stage 2 12/28/2021 No Yes I87.2 Venous insufficiency (chronic) (peripheral) 12/28/2021 No Yes I10 Essential (primary) hypertension 12/28/2021 No Yes Inactive Problems Resolved Problems Electronic Signature(s) Signed: 01/25/2022 2:45:33 PM By: Fredirick Maudlin MD FACS Entered By: Fredirick Maudlin on 01/25/2022 14:45:33 -------------------------------------------------------------------------------- Progress Note Details Patient Name: Date of Service: Katrina Amel D. 01/25/2022 1:15 PM Medical Record Number: [id number] Patient Account Number: [id number] Date of Birth/Sex: Treating RN: [birth year] (88 y.o. Katrina [name] Primary Care Provider: [name] Other Clinician: Referring Provider: Treating Provider/Extender: Kathlyn Sacramento in Treatment: 4 Subjective Chief Complaint Information obtained from Patient Patient returns to the wound care center for reopened ulcer to: Left buttock History of Present Illness (HPI) The following HPI elements were  documented for the patient's wound: Location: right lower extremity Quality: Patient reports experiencing a dull pain to affected area(s). Severity: Patient states wound(s) are getting better. Duration: Patient has had the wound for 3 months prior to presenting for treatment Context: The wound would happen gradually Modifying Factors: Patient wound(s)/ulcer(s) are worsening due to :standing for a long while. Past medical history of pulmonary emboli, hypothyroidism, hyperlipidemia, hypertension, sleep apnea, gallstones, ulcerated lower extremities, atrial fibrillation, status post total knee replacement, cholecystectomy, cystoscopy and stent placement for kidney stones. She has never been a smoker. About a year ago she was seen by [name] with an ulcer of her right lower extremity which she's had for about over 3 months. On 10/23/14 -- Lower extremity venous duplex evaluation. She had no evidence of deep venous thromboses involving both lower extremities. There was evidence of deep vein reflux in the right and left common femoral veins. She also had evidence of great saphenous vein reflux noted only at the level of the saphenofemoral junction. No evidence of greater or small saphenous vein reflux. She will need a consult to the vascular surgeon. Seen by Dr. Mathis Fare office note dated 11/13/2014. He noted that she had good arterial blood flow with palpable DP pulses bilaterally. Based on the patient's history Dr. Scot Dock recommended elevation when at rest of both feet and knees and ankles above heart level in the supine position. Activity such as walking and pool exercises were recommended. She will also have 15-20 mm thigh-high compression stockings daily once the ulcers have completely healed. A prescription was given to her. 11/04/2015 -- the wound on her right lower extremity has completely healed and her edema has gone down significantly. However in her popliteal fossa where she's got a lot of fungal  infection there is an area which is now an open wound and this will need attention. 12/02/2015 -- the right lower extremity looks much better than  the edema has gone down significantly however she continues to have significant edema on the left side today. 12/09/2015 -- the patient has been doing her dressings as advised and says that overall she feels much better. She has received her juxta light for the left lower extremity READMISSION 06/25/2021 This is a patient who is actually had several previous visits to our clinic in 2014, 2016 and more recently 2017. This was largely because of wounds on her lower legs in the setting of chronic venous insufficiency and secondary lymphedema. Looking at my records she was apparently prescribed thigh-high compression stockings probably by vein and vascular although she comes back in not having worn any stockings in quite a period of time. She is now in the assisted living part of [facility]. She thinks her wounds have been there since April. This includes the left lateral, right anterior and right posterior lateral parts of both lower legs. She also has a stage II pressure ulcer on her left glued I think which is happened because of sleeping in a lift chair. She had ABDs and kerlix on her bilateral lower legs. Past medical history reviewed she has hypothyroidism, left buttock pressure ulcer dating back to March of this year, iron deficiency, falls, paroxysmal atrial fibrillation anticoagulated and chronic venous insufficiency ABIs in our clinic were 0.86 on the right and 0.96 on the left 11/10; patient comes into clinic today after being admitted last week with uncontrolled lymphedema, severe venous insufficiency and some superficial wounds on her bilateral lower legs. We put her in 3 layer compression and she comes in today with all the wounds closed and considerable improvement in the condition of her lower legs. The patient is  going to need lifelong compression stockings probably juxta lites and we are going to go ahead and order those for her today. Unfortunately the POA here is a niece in [place] were probably going to have to talk with her to arrange proper payment for the juxta lite stockings. She is in the assisted living level of [facility]. She does not feel she can get the stockings on independently I am hopeful that the nurse and aids at the assisted living can help her with this otherwise this may be a problem. She also has an area on her left buttock which is a stage II wound. A lot of unhealthy irritated skin around this I think from moisture prolonged sittingo Incontinence 11/17; the patient's area on the left buttock is just about closed although there is still a lot of unhealthy looking discolored skin around a large area here. I suspect this is chronic moisture, pressure, perhaps incontinence. she has minuscule open areas on her bilateral lower legs. Apparently our intermediary did not get the order for juxta lite stockings therefore she does not have any. 12/1; both leg wounds are healed and she has been wearing bilateral juxta lite stockings for 2 days. [facility] are [facility] her with this. The left buttock wound unfortunately has deteriorated 12/15; 2-week follow-up. Her legs remain healed she has the other area which is on her left buttock. This is surrounded circumferentially by chronically irritated skin. Mirror image on the right side as well. I suspect this is chronic pressure friction and moisture from incontinence. This makes it difficult to get this area to completely close. We have been using silver alginate 12/29 both her legs remain closed and she has bilateral juxta lite stockings. The real problem here is  her left buttock she has a small wound in the mid part of a extremely irritated dermatitis. It almost looks bruised in spots. There is superficial loss of  epithelium. The actual wound that we have been dealing with does not look so bad. I have been assuming this is some combination of friction pressure wetness from constant urinary incontinence. If there is a dermatologic issue outside of that here I am not seeing it 09/03/2021; patient with a small area on the left buttock. The real problem here is a marked surrounding erythematous dermatitis with loss of surface epithelium. It looks as though she is beginning to develop satellite lesions this may be mostly fungal 1/26; the area on her left buttock is closed however she still has very irritated macerated skin in this area. Some of this appears fungal. I treated this with ketoconazole last time and I think it looks somewhat better although there is still some area present that still looks like a fungal dermatitis. Everything on the right buttock looks a lot better. READMISSION 12/28/2021: The patient returns today with a reopening of a wound on her left buttock. She says it has been present for about 6 months; I am not sure how this is possible since she was seen for similar in January and subsequently discharged from the clinic. Nonetheless, she has some purplish discoloration of her buttock with an open area and some scarring around the perimeter of a shallow stage II ulcer on her buttock. According to the previous clinic notes, there was some question as to whether or not this was due to fungal breakdown of her skin. She currently resides at [facility], in the assisted living section. She says that she cannot see the site and is unable to care for it herself. 01/11/2022: The wound is a little bit smaller today but has a very friable surface. It is an odd wound. On further inspection, it is almost as if there are dilated vessels and the underlying skin as I can compress the surrounding tissue, it dents in, and then fills back up, as if with fluid or blood. She did have an episode during the week when  she had a lot of blood come from the wound, but is not actively bleeding today. 01/25/2022: No significant change to the wound. She continues to have purple discoloration of her buttocks, but it does still blanch. Patient History Information obtained from Patient. Family History Cancer - Siblings, Heart Disease - Mother,Father, Hypertension - Father, No family history of Hereditary Spherocytosis, Kidney Disease, Lung Disease, Seizures, Stroke, Thyroid Problems, Tuberculosis. Social History Never smoker, Marital Status - Single, Alcohol Use - Never, Drug Use - No History, Caffeine Use - Rarely. Medical History Eyes Denies history of Cataracts, Glaucoma, Optic Neuritis Ear/Nose/Mouth/Throat Denies history of Chronic sinus problems/congestion, Middle ear problems Hematologic/Lymphatic Denies history of Anemia, Hemophilia, Human Immunodeficiency Virus, Lymphedema, Sickle Cell Disease Respiratory Patient has history of Sleep Apnea - does not use Cpap or 02 as ordered Denies history of Aspiration, Asthma, Chronic Obstructive Pulmonary Disease (COPD), Pneumothorax, Tuberculosis Cardiovascular Patient has history of Arrhythmia - A-Fib, Hypertension, Phlebitis Denies history of Angina, Congestive Heart Failure, Coronary Artery Disease, Deep Vein Thrombosis, Hypotension, Myocardial Infarction, Peripheral Arterial Disease, Peripheral Venous Disease, Vasculitis Gastrointestinal Denies history of Cirrhosis , Colitis, Crohnoos, Hepatitis A, Hepatitis B, Hepatitis C Endocrine Denies history of Type I Diabetes, Type II Diabetes Genitourinary Denies history of End Stage Renal Disease Immunological Denies history of Lupus Erythematosus, Raynaudoos, Scleroderma Integumentary (Skin) Denies  history of History of Burn Musculoskeletal Denies history of Gout, Rheumatoid Arthritis, Osteoarthritis, Osteomyelitis Neurologic Denies history of Dementia, Neuropathy, Quadriplegia, Paraplegia, Seizure  Disorder Oncologic Denies history of Received Chemotherapy, Received Radiation Psychiatric Denies history of Anorexia/bulimia, Confinement Anxiety Medical A Surgical History Notes nd Cardiovascular Hyperlipidemia Endocrine Hypothyroidism Objective Constitutional Slightly hypertensive. No acute distress. Vitals Time Taken: 1:41 PM, Temperature: 98.2 F, Pulse: 67 bpm, Respiratory Rate: 18 breaths/min, Blood Pressure: 157/73 mmHg. Respiratory Normal work of breathing on room air. General Notes: 01/25/2022: No substantial change to the wound today. The surface remains a little bit friable and the edges are slightly heaped around the surface, but this is not epibole or other senescent tissue. Her buttocks are still discolored purple, but they blanch bilaterally. Integumentary (Hair, Skin) Wound #14 status is Open. Original cause of wound was Pressure Injury. The date acquired was: 08/23/2021. The wound has been in treatment 4 weeks. The wound is located on the Left Gluteus. The wound measures 0.7cm length x 0.4cm width x 0.3cm depth; 0.22cm^2 area and 0.066cm^3 volume. There is Fat Layer (Subcutaneous Tissue) exposed. There is no tunneling or undermining noted. There is a medium amount of serosanguineous drainage noted. The wound margin is epibole. There is large (67-100%) red, friable granulation within the wound bed. There is no necrotic tissue within the wound bed. Assessment Active Problems ICD-10 Pressure ulcer of left buttock, stage 2 Venous insufficiency (chronic) (peripheral) Essential (primary) hypertension Plan Follow-up Appointments: Return Appointment in 2 weeks. - Dr. Celine Ahr Rm 1 Monday 6/19 @ 1:15 pm Bathing/ Shower/ Hygiene: May shower and wash wound with soap and water. Off-Loading: Turn and reposition every 2 hours - stand for a few minutes at least every hour during the day WOUND #14: - Gluteus Wound Laterality: Left Cleanser: Soap and Water 1 x Per Day/30  Days Discharge Instructions: May shower and wash wound with dial antibacterial soap and water prior to dressing change. Prim Dressing: KerraCel Ag Gelling Fiber Dressing, 2x2 in (silver alginate) 1 x Per Day/30 Days ary Discharge Instructions: Apply silver alginate to wound bed as instructed Secondary Dressing: ALLEVYN Gentle Border, 4x4 (in/in) 1 x Per Day/30 Days Discharge Instructions: Apply over primary dressing as directed. 01/25/2022: No substantial change to the wound today. The surface remains a little bit friable and the edges are slightly heaped around the surface, but this is not epibole or other senescent tissue. Her buttocks are still discolored purple, but they blanch bilaterally. No debridement was necessary today. We will continue using silver alginate with a bordered foam dressing. She was reminded to stay off of her buttocks is much as possible, including using a pillow to prop herself up at night to avoid rolling onto it in her sleep. She will follow-up in 2 weeks. Electronic Signature(s) Signed: 01/25/2022 2:50:33 PM By: Fredirick Maudlin MD FACS Entered By: Fredirick Maudlin on 01/25/2022 14:50:33 -------------------------------------------------------------------------------- HxROS Details Patient Name: Date of Service: Katrina Amel D. 01/25/2022 1:15 PM Medical Record Number: [id number] Patient Account Number: [id number] Date of Birth/Sex: Treating RN: Jan 27, 1932 (88 y.o. Katrina [name] Primary Care Provider: [name] Other Clinician: Referring Provider: Treating Provider/Extender: Kathlyn [place] in Treatment: 4 Information Obtained From Patient Eyes Medical History: Negative for: Cataracts; Glaucoma; Optic Neuritis Ear/Nose/Mouth/Throat Medical History: Negative for: Chronic sinus problems/congestion; Middle ear problems Hematologic/Lymphatic Medical History: Negative for: Anemia; Hemophilia; Human Immunodeficiency Virus;  Lymphedema; Sickle Cell Disease Respiratory Medical History: Positive for: Sleep Apnea - does not use Cpap or 02 as ordered  Negative for: Aspiration; Asthma; Chronic Obstructive Pulmonary Disease (COPD); Pneumothorax; Tuberculosis Cardiovascular Medical History: Positive for: Arrhythmia - A-Fib; Hypertension; Phlebitis Negative for: Angina; Congestive Heart Failure; Coronary Artery Disease; Deep Vein Thrombosis; Hypotension; Myocardial Infarction; Peripheral Arterial Disease; Peripheral Venous Disease; Vasculitis Past Medical History Notes: Hyperlipidemia Gastrointestinal Medical History: Negative for: Cirrhosis ; Colitis; Crohns; Hepatitis A; Hepatitis B; Hepatitis C Endocrine Medical History: Negative for: Type I Diabetes; Type II Diabetes Past Medical History Notes: Hypothyroidism Genitourinary Medical History: Negative for: End Stage Renal Disease Immunological Medical History: Negative for: Lupus Erythematosus; Raynauds; Scleroderma Integumentary (Skin) Medical History: Negative for: History of Burn Musculoskeletal Medical History: Negative for: Gout; Rheumatoid Arthritis; Osteoarthritis; Osteomyelitis Neurologic Medical History: Negative for: Dementia; Neuropathy; Quadriplegia; Paraplegia; Seizure Disorder Oncologic Medical History: Negative for: Received Chemotherapy; Received Radiation Psychiatric Medical History: Negative for: Anorexia/bulimia; Confinement Anxiety Immunizations Pneumococcal Vaccine: Received Pneumococcal Vaccination: Yes Received Pneumococcal Vaccination On or After 60th Birthday: No Implantable Devices None Family and Social History Cancer: Yes - Siblings; Heart Disease: Yes - Mother,Father; Hereditary Spherocytosis: No; Hypertension: Yes - Father; Kidney Disease: No; Lung Disease: No; Seizures: No; Stroke: No; Thyroid Problems: No; Tuberculosis: No; Never smoker; Marital Status - Single; Alcohol Use: Never; Drug Use: No History; Caffeine  Use: Rarely; Financial Concerns: No; Food, Clothing or Shelter Needs: No; Support System Lacking: No; Transportation Concerns: No Engineer, maintenance) Signed: 01/25/2022 4:31:59 PM By: Fredirick Maudlin MD FACS Signed: 01/25/2022 5:13:49 PM By: Baruch Gouty RN, BSN Entered By: Fredirick Maudlin on 01/25/2022 14:48:17 -------------------------------------------------------------------------------- Reading Details Patient Name: Date of Service: Katrina Amel D. 01/25/2022 Medical Record Number: [id number] Patient Account Number: [id number] Date of Birth/Sex: Treating RN: [birth year] (88 y.o. Katrina [name] Primary Care Provider: [name] Other Clinician: Referring Provider: Treating Provider/Extender: Kathlyn [place] in Treatment: 4 Diagnosis Coding ICD-10 Codes Code Description 6087122441 Pressure ulcer of left buttock, stage 2 I87.2 Venous insufficiency (chronic) (peripheral) I10 Essential (primary) hypertension Facility Procedures Physician Procedures : CPT4 Code Description Modifier [id number] - WC PHYS LEVEL 3 - EST PT ICD-10 Diagnosis Description L89.322 Pressure ulcer of left buttock, stage 2 I87.2 Venous insufficiency (chronic) (peripheral) I10 Essential (primary) hypertension Quantity: 1 Electronic Signature(s) Signed: 01/25/2022 2:50:47 PM By: Fredirick Maudlin MD FACS Entered By: Fredirick Maudlin on 01/25/2022 14:50:46

## 2022-02-08 ENCOUNTER — Encounter (HOSPITAL_BASED_OUTPATIENT_CLINIC_OR_DEPARTMENT_OTHER): Payer: Medicare Other | Admitting: General Surgery

## 2022-02-08 DIAGNOSIS — L89322 Pressure ulcer of left buttock, stage 2: Secondary | ICD-10-CM | POA: Diagnosis not present

## 2022-02-10 NOTE — Progress Notes (Signed)
NICK, ARMEL ([id number]) Visit Report for 02/08/2022 Chief Complaint Document Details Patient Name: Date of Service: Katrina [name], Katrina D. 02/08/2022 1:15 PM Medical Record Number: [id number] Patient Account Number: [id number] Date of Birth/Sex: Treating RN: Jan 04, 1932 (88 y.o. [name] Primary Care Provider: [name] Other Clinician: Referring Provider: Treating Provider/Extender: Kathlyn Sacramento in Treatment: 6 Information Obtained from: Patient Chief Complaint Patient returns to the wound care center for reopened ulcer to: Left buttock Electronic Signature(s) Signed: 02/08/2022 1:55:18 PM By: Fredirick Maudlin MD FACS Entered By: Fredirick Maudlin on 02/08/2022 13:55:18 -------------------------------------------------------------------------------- Debridement Details Patient Name: Date of Service: Katrina Amel D. 02/08/2022 1:15 PM Medical Record Number: [id number] Patient Account Number: [id number] Date of Birth/Sex: Treating RN: 1932/02/20 (88 y.o. [name] Primary Care Provider: [name] Other Clinician: Referring Provider: Treating Provider/Extender: Kathlyn Sacramento in Treatment: 6 Debridement Performed for Assessment: Wound #14 Left Gluteus Performed By: Physician Fredirick Maudlin, MD Debridement Type: Debridement Level of Consciousness (Pre-procedure): Awake and Alert Pre-procedure Verification/Time Out Yes - 13:45 Taken: Start Time: 13:48 Pain Control: Other : benzocaine 20% spray T Area Debrided (L x W): otal 0.3 (cm) x 1 (cm) = 0.3 (cm) Tissue and other material debrided: Non-Viable, Slough, Biofilm, Slough Level: Non-Viable Tissue Debridement Description: Selective/Open Wound Instrument: Curette Bleeding: Minimum Hemostasis Achieved: Silver Nitrate Procedural Pain: 5 Post Procedural Pain: 4 Response to Treatment: Procedure was tolerated well Level of Consciousness (Post- Awake and  Alert procedure): Post Debridement Measurements of Total Wound Length: (cm) 0.3 Stage: Category/Stage III Width: (cm) 1 Depth: (cm) 0.3 Volume: (cm) 0.071 Character of Wound/Ulcer Post Debridement: Improved Post Procedure Diagnosis Same as Pre-procedure Electronic Signature(s) Signed: 02/08/2022 5:02:10 PM By: Fredirick Maudlin MD FACS Signed: 02/10/2022 6:21:16 PM By: Baruch Gouty RN, BSN Entered By: Baruch Gouty on 02/08/2022 13:52:42 -------------------------------------------------------------------------------- HPI Details Patient Name: Date of Service: Katrina Amel D. 02/08/2022 1:15 PM Medical Record Number: [id number] Patient Account Number: [id number] Date of Birth/Sex: Treating RN: Sep 14, 1931 (88 y.o. [name] Primary Care Provider: [name] Other Clinician: Referring Provider: Treating Provider/Extender: Kathlyn Sacramento in Treatment: 6 History of Present Illness Location: right lower extremity Quality: Patient reports experiencing a dull pain to affected area(s). Severity: Patient states wound(s) are getting better. Duration: Patient has had the wound for 3 months prior to presenting for treatment Context: The wound would happen gradually Modifying Factors: Patient wound(s)/ulcer(s) are worsening due to :standing for a long while. HPI Description: Past medical history of pulmonary emboli, hypothyroidism, hyperlipidemia, hypertension, sleep apnea, gallstones, ulcerated lower extremities, atrial fibrillation, status post total knee replacement, cholecystectomy, cystoscopy and stent placement for kidney stones. She has never been a smoker. About a year ago she was seen by [name] with an ulcer of her right lower extremity which she's had for about over 3 months. On 10/23/14 -- Lower extremity venous duplex evaluation. She had no evidence of deep venous thromboses involving both lower extremities. There was evidence of deep vein reflux  in the right and left common femoral veins. She also had evidence of great saphenous vein reflux noted only at the level of the saphenofemoral junction. No evidence of greater or small saphenous vein reflux. She will need a consult to the vascular surgeon. Seen by Dr. Mathis Fare office note dated 11/13/2014. He noted that she had good arterial blood flow with palpable DP pulses bilaterally. Based on the patient's history Dr. Scot Dock recommended elevation when at rest of both feet and knees  and ankles above heart level in the supine position. Activity such as walking and pool exercises were recommended. She will also have 15-20 mm thigh-high compression stockings daily once the ulcers have completely healed. A prescription was given to her. 11/04/2015 -- the wound on her right lower extremity has completely healed and her edema has gone down significantly. However in her popliteal fossa where she's got a lot of fungal infection there is an area which is now an open wound and this will need attention. 12/02/2015 -- the right lower extremity looks much better than the edema has gone down significantly however she continues to have significant edema on the left side today. 12/09/2015 -- the patient has been doing her dressings as advised and says that overall she feels much better. She has received her juxta light for the left lower extremity READMISSION 06/25/2021 This is a patient who is actually had several previous visits to our clinic in 2014, 2016 and more recently 2017. This was largely because of wounds on her lower legs in the setting of chronic venous insufficiency and secondary lymphedema. Looking at my records she was apparently prescribed thigh-high compression stockings probably by vein and vascular although she comes back in not having worn any stockings in quite a period of time. She is now in the assisted living part of [facility]. She thinks her wounds have  been there since April. This includes the left lateral, right anterior and right posterior lateral parts of both lower legs. She also has a stage II pressure ulcer on her left glued I think which is happened because of sleeping in a lift chair. She had ABDs and kerlix on her bilateral lower legs. Past medical history reviewed she has hypothyroidism, left buttock pressure ulcer dating back to March of this year, iron deficiency, falls, paroxysmal atrial fibrillation anticoagulated and chronic venous insufficiency ABIs in our clinic were 0.86 on the right and 0.96 on the left 11/10; patient comes into clinic today after being admitted last week with uncontrolled lymphedema, severe venous insufficiency and some superficial wounds on her bilateral lower legs. We put her in 3 layer compression and she comes in today with all the wounds closed and considerable improvement in the condition of her lower legs. The patient is going to need lifelong compression stockings probably juxta lites and we are going to go ahead and order those for her today. Unfortunately the POA here is a niece in [place] were probably going to have to talk with her to arrange proper payment for the juxta lite stockings. She is in the assisted living level of [facility]. She does not feel she can get the stockings on independently I am hopeful that the nurse and aids at the assisted living can help her with this otherwise this may be a problem. She also has an area on her left buttock which is a stage II wound. A lot of unhealthy irritated skin around this I think from moisture prolonged sittingo Incontinence 11/17; the patient's area on the left buttock is just about closed although there is still a lot of unhealthy looking discolored skin around a large area here. I suspect this is chronic moisture, pressure, perhaps incontinence. she has minuscule open areas on her bilateral lower legs. Apparently our intermediary  did not get the order for juxta lite stockings therefore she does not have any. 12/1; both leg wounds are healed and she has been wearing bilateral juxta lite stockings for  2 days. [facility] are [facility] her with this. The left buttock wound unfortunately has deteriorated 12/15; 2-week follow-up. Her legs remain healed she has the other area which is on her left buttock. This is surrounded circumferentially by chronically irritated skin. Mirror image on the right side as well. I suspect this is chronic pressure friction and moisture from incontinence. This makes it difficult to get this area to completely close. We have been using silver alginate 12/29 both her legs remain closed and she has bilateral juxta lite stockings. The real problem here is her left buttock she has a small wound in the mid part of a extremely irritated dermatitis. It almost looks bruised in spots. There is superficial loss of epithelium. The actual wound that we have been dealing with does not look so bad. I have been assuming this is some combination of friction pressure wetness from constant urinary incontinence. If there is a dermatologic issue outside of that here I am not seeing it 09/03/2021; patient with a small area on the left buttock. The real problem here is a marked surrounding erythematous dermatitis with loss of surface epithelium. It looks as though she is beginning to develop satellite lesions this may be mostly fungal 1/26; the area on her left buttock is closed however she still has very irritated macerated skin in this area. Some of this appears fungal. I treated this with ketoconazole last time and I think it looks somewhat better although there is still some area present that still looks like a fungal dermatitis. Everything on the right buttock looks a lot better. READMISSION 12/28/2021: The patient returns today with a reopening of a wound on her left buttock. She says it has been present for  about 6 months; I am not sure how this is possible since she was seen for similar in January and subsequently discharged from the clinic. Nonetheless, she has some purplish discoloration of her buttock with an open area and some scarring around the perimeter of a shallow stage II ulcer on her buttock. According to the previous clinic notes, there was some question as to whether or not this was due to fungal breakdown of her skin. She currently resides at [facility], in the assisted living section. She says that she cannot see the site and is unable to care for it herself. 01/11/2022: The wound is a little bit smaller today but has a very friable surface. It is an odd wound. On further inspection, it is almost as if there are dilated vessels and the underlying skin as I can compress the surrounding tissue, it dents in, and then fills back up, as if with fluid or blood. She did have an episode during the week when she had a lot of blood come from the wound, but is not actively bleeding today. 01/25/2022: No significant change to the wound. She continues to have purple discoloration of her buttocks, but it does still blanch. 02/08/2022: The wound was measured at slightly larger today, but to my inspection it appears about the same. It is most unusual and certainly not consistent with a typical pressure ulcer. I think it is potentially related to what ever is causing the purplish discoloration of her buttocks. Remains friable and bleeds easily with minimal manipulation. Electronic Signature(s) Signed: 02/08/2022 1:56:13 PM By: Fredirick Maudlin MD FACS Entered By: Fredirick Maudlin on 02/08/2022 13:56:12 -------------------------------------------------------------------------------- Physical Exam Details Patient Name: Date of Service: Katrina Amel D. 02/08/2022 1:15 PM Medical Record Number: [id number]  Patient Account Number: [id number] Date of Birth/Sex: Treating RN: 04-23-1932 (88 y.o. [name] Primary Care Provider: [name] Other Clinician: Referring Provider: Treating Provider/Extender: Kathlyn Sacramento in Treatment: 6 Constitutional Hypertensive, asymptomatic. . . . No acute distress.Marland Kitchen Respiratory Normal work of breathing on room air.. Notes 02/08/2022: The wound was measured at slightly larger today, but to my inspection it appears about the same. It is most unusual and certainly not consistent with a typical pressure ulcer. I think it is potentially related to what ever is causing the purplish discoloration of her buttocks. It remains friable and bleeds easily with minimal manipulation. Electronic Signature(s) Signed: 02/08/2022 1:56:56 PM By: Fredirick Maudlin MD FACS Entered By: Fredirick Maudlin on 02/08/2022 13:56:56 -------------------------------------------------------------------------------- Physician Orders Details Patient Name: Date of Service: Katrina Amel D. 02/08/2022 1:15 PM Medical Record Number: [id number] Patient Account Number: [id number] Date of Birth/Sex: Treating RN: Dec 15, 1931 (88 y.o. [name] Primary Care Provider: [name] Other Clinician: Referring Provider: Treating Provider/Extender: Kathlyn Sacramento in Treatment: 6 Verbal / Phone Orders: No Diagnosis Coding ICD-10 Coding Code Description [phone] Pressure ulcer of left buttock, stage 2 I87.2 Venous insufficiency (chronic) (peripheral) I10 Essential (primary) hypertension Follow-up Appointments ppointment in 2 weeks. - Dr. Celine Ahr Rm 1 Return A Monday 7/3 Bathing/ Shower/ Hygiene May shower and wash wound with soap and water. Off-Loading Turn and reposition every 2 hours - stand for a few minutes at least every hour during the day Wound Treatment Wound #14 - Gluteus Wound Laterality: Left Cleanser: Soap and Water 1 x Per Day/30 Days Discharge Instructions: May shower and wash wound with dial antibacterial  soap and water prior to dressing change. Prim Dressing: KerraCel Ag Gelling Fiber Dressing, 2x2 in (silver alginate) 1 x Per Day/30 Days ary Discharge Instructions: Apply silver alginate to wound bed as instructed Secondary Dressing: ALLEVYN Gentle Border, 4x4 (in/in) 1 x Per Day/30 Days Discharge Instructions: Apply over primary dressing as directed. Electronic Signature(s) Signed: 02/08/2022 5:02:10 PM By: Fredirick Maudlin MD FACS Entered By: Fredirick Maudlin on 02/08/2022 13:57:41 -------------------------------------------------------------------------------- Problem List Details Patient Name: Date of Service: Katrina Amel D. 02/08/2022 1:15 PM Medical Record Number: [id number] Patient Account Number: [id number] Date of Birth/Sex: Treating RN: 1932/05/18 (88 y.o. [name] Primary Care Provider: [name] Other Clinician: Referring Provider: Treating Provider/Extender: Kathlyn Sacramento in Treatment: 6 Active Problems ICD-10 Encounter Code Description Active Date MDM Diagnosis [phone] Pressure ulcer of left buttock, stage 2 12/28/2021 No Yes I87.2 Venous insufficiency (chronic) (peripheral) 12/28/2021 No Yes I10 Essential (primary) hypertension 12/28/2021 No Yes Inactive Problems Resolved Problems Electronic Signature(s) Signed: 02/08/2022 1:54:43 PM By: Fredirick Maudlin MD FACS Entered By: Fredirick Maudlin on 02/08/2022 13:54:42 -------------------------------------------------------------------------------- Progress Note Details Patient Name: Date of Service: Katrina Amel D. 02/08/2022 1:15 PM Medical Record Number: [id number] Patient Account Number: [id number] Date of Birth/Sex: Treating RN: 1932/01/17 (88 y.o. [name] Primary Care Provider: [name] Other Clinician: Referring Provider: Treating Provider/Extender: Kathlyn Sacramento in Treatment: 6 Subjective Chief Complaint Information obtained  from Patient Patient returns to the wound care center for reopened ulcer to: Left buttock History of Present Illness (HPI) The following HPI elements were documented for the patient's wound: Location: right lower extremity Quality: Patient reports experiencing a dull pain to affected area(s). Severity: Patient states wound(s) are getting better. Duration: Patient has had the wound for 3 months prior to presenting for treatment Context: The wound would happen  gradually Modifying Factors: Patient wound(s)/ulcer(s) are worsening due to :standing for a long while. Past medical history of pulmonary emboli, hypothyroidism, hyperlipidemia, hypertension, sleep apnea, gallstones, ulcerated lower extremities, atrial fibrillation, status post total knee replacement, cholecystectomy, cystoscopy and stent placement for kidney stones. She has never been a smoker. About a year ago she was seen by [name] with an ulcer of her right lower extremity which she's had for about over 3 months. On 10/23/14 -- Lower extremity venous duplex evaluation. She had no evidence of deep venous thromboses involving both lower extremities. There was evidence of deep vein reflux in the right and left common femoral veins. She also had evidence of great saphenous vein reflux noted only at the level of the saphenofemoral junction. No evidence of greater or small saphenous vein reflux. She will need a consult to the vascular surgeon. Seen by Dr. Mathis Fare office note dated 11/13/2014. He noted that she had good arterial blood flow with palpable DP pulses bilaterally. Based on the patient's history Dr. Scot Dock recommended elevation when at rest of both feet and knees and ankles above heart level in the supine position. Activity such as walking and pool exercises were recommended. She will also have 15-20 mm thigh-high compression stockings daily once the ulcers have completely healed. A prescription was given to her. 11/04/2015 -- the wound  on her right lower extremity has completely healed and her edema has gone down significantly. However in her popliteal fossa where she's got a lot of fungal infection there is an area which is now an open wound and this will need attention. 12/02/2015 -- the right lower extremity looks much better than the edema has gone down significantly however she continues to have significant edema on the left side today. 12/09/2015 -- the patient has been doing her dressings as advised and says that overall she feels much better. She has received her juxta light for the left lower extremity READMISSION 06/25/2021 This is a patient who is actually had several previous visits to our clinic in 2014, 2016 and more recently 2017. This was largely because of wounds on her lower legs in the setting of chronic venous insufficiency and secondary lymphedema. Looking at my records she was apparently prescribed thigh-high compression stockings probably by vein and vascular although she comes back in not having worn any stockings in quite a period of time. She is now in the assisted living part of [facility]. She thinks her wounds have been there since April. This includes the left lateral, right anterior and right posterior lateral parts of both lower legs. She also has a stage II pressure ulcer on her left glued I think which is happened because of sleeping in a lift chair. She had ABDs and kerlix on her bilateral lower legs. Past medical history reviewed she has hypothyroidism, left buttock pressure ulcer dating back to March of this year, iron deficiency, falls, paroxysmal atrial fibrillation anticoagulated and chronic venous insufficiency ABIs in our clinic were 0.86 on the right and 0.96 on the left 11/10; patient comes into clinic today after being admitted last week with uncontrolled lymphedema, severe venous insufficiency and some superficial wounds on her bilateral lower legs. We put  her in 3 layer compression and she comes in today with all the wounds closed and considerable improvement in the condition of her lower legs. The patient is going to need lifelong compression stockings probably juxta lites and we are going to go ahead and order those for her  today. Unfortunately the POA here is a niece in [place] were probably going to have to talk with her to arrange proper payment for the juxta lite stockings. She is in the assisted living level of [facility]. She does not feel she can get the stockings on independently I am hopeful that the nurse and aids at the assisted living can help her with this otherwise this may be a problem. She also has an area on her left buttock which is a stage II wound. A lot of unhealthy irritated skin around this I think from moisture prolonged sittingo Incontinence 11/17; the patient's area on the left buttock is just about closed although there is still a lot of unhealthy looking discolored skin around a large area here. I suspect this is chronic moisture, pressure, perhaps incontinence. she has minuscule open areas on her bilateral lower legs. Apparently our intermediary did not get the order for juxta lite stockings therefore she does not have any. 12/1; both leg wounds are healed and she has been wearing bilateral juxta lite stockings for 2 days. [facility] are assisting her with this. The left buttock wound unfortunately has deteriorated 12/15; 2-week follow-up. Her legs remain healed she has the other area which is on her left buttock. This is surrounded circumferentially by chronically irritated skin. Mirror image on the right side as well. I suspect this is chronic pressure friction and moisture from incontinence. This makes it difficult to get this area to completely close. We have been using silver alginate 12/29 both her legs remain closed and she has bilateral juxta lite stockings. The real problem here is  her left buttock she has a small wound in the mid part of a extremely irritated dermatitis. It almost looks bruised in spots. There is superficial loss of epithelium. The actual wound that we have been dealing with does not look so bad. I have been assuming this is some combination of friction pressure wetness from constant urinary incontinence. If there is a dermatologic issue outside of that here I am not seeing it 09/03/2021; patient with a small area on the left buttock. The real problem here is a marked surrounding erythematous dermatitis with loss of surface epithelium. It looks as though she is beginning to develop satellite lesions this may be mostly fungal 1/26; the area on her left buttock is closed however she still has very irritated macerated skin in this area. Some of this appears fungal. I treated this with ketoconazole last time and I think it looks somewhat better although there is still some area present that still looks like a fungal dermatitis. Everything on the right buttock looks a lot better. READMISSION 12/28/2021: The patient returns today with a reopening of a wound on her left buttock. She says it has been present for about 6 months; I am not sure how this is possible since she was seen for similar in January and subsequently discharged from the clinic. Nonetheless, she has some purplish discoloration of her buttock with an open area and some scarring around the perimeter of a shallow stage II ulcer on her buttock. According to the previous clinic notes, there was some question as to whether or not this was due to fungal breakdown of her skin. She currently resides at [facility], in the assisted living section. She says that she cannot see the site and is unable to care for it herself. 01/11/2022: The wound is a little bit smaller today but has a  very friable surface. It is an odd wound. On further inspection, it is almost as if there are dilated vessels and the underlying  skin as I can compress the surrounding tissue, it dents in, and then fills back up, as if with fluid or blood. She did have an episode during the week when she had a lot of blood come from the wound, but is not actively bleeding today. 01/25/2022: No significant change to the wound. She continues to have purple discoloration of her buttocks, but it does still blanch. 02/08/2022: The wound was measured at slightly larger today, but to my inspection it appears about the same. It is most unusual and certainly not consistent with a typical pressure ulcer. I think it is potentially related to what ever is causing the purplish discoloration of her buttocks. Remains friable and bleeds easily with minimal manipulation. Patient History Information obtained from Patient. Family History Cancer - Siblings, Heart Disease - Mother,Father, Hypertension - Father, No family history of Hereditary Spherocytosis, Kidney Disease, Lung Disease, Seizures, Stroke, Thyroid Problems, Tuberculosis. Social History Never smoker, Marital Status - Single, Alcohol Use - Never, Drug Use - No History, Caffeine Use - Rarely. Medical History Eyes Denies history of Cataracts, Glaucoma, Optic Neuritis Ear/Nose/Mouth/Throat Denies history of Chronic sinus problems/congestion, Middle ear problems Hematologic/Lymphatic Denies history of Anemia, Hemophilia, Human Immunodeficiency Virus, Lymphedema, Sickle Cell Disease Respiratory Patient has history of Sleep Apnea - does not use Cpap or 02 as ordered Denies history of Aspiration, Asthma, Chronic Obstructive Pulmonary Disease (COPD), Pneumothorax, Tuberculosis Cardiovascular Patient has history of Arrhythmia - A-Fib, Hypertension, Phlebitis Denies history of Angina, Congestive Heart Failure, Coronary Artery Disease, Deep Vein Thrombosis, Hypotension, Myocardial Infarction, Peripheral Arterial Disease, Peripheral Venous Disease, Vasculitis Gastrointestinal Denies history of Cirrhosis  , Colitis, Crohnoos, Hepatitis A, Hepatitis B, Hepatitis C Endocrine Denies history of Type I Diabetes, Type II Diabetes Genitourinary Denies history of End Stage Renal Disease Immunological Denies history of Lupus Erythematosus, Raynaudoos, Scleroderma Integumentary (Skin) Denies history of History of Burn Musculoskeletal Denies history of Gout, Rheumatoid Arthritis, Osteoarthritis, Osteomyelitis Neurologic Denies history of Dementia, Neuropathy, Quadriplegia, Paraplegia, Seizure Disorder Oncologic Denies history of Received Chemotherapy, Received Radiation Psychiatric Denies history of Anorexia/bulimia, Confinement Anxiety Medical A Surgical History Notes nd Cardiovascular Hyperlipidemia Endocrine Hypothyroidism Objective Constitutional Hypertensive, asymptomatic. No acute distress.. Vitals Time Taken: 3:33 PM, Temperature: 98.7 F, Pulse: 66 bpm, Respiratory Rate: 18 breaths/min, Blood Pressure: 179/69 mmHg. Respiratory Normal work of breathing on room air.. General Notes: 02/08/2022: The wound was measured at slightly larger today, but to my inspection it appears about the same. It is most unusual and certainly not consistent with a typical pressure ulcer. I think it is potentially related to what ever is causing the purplish discoloration of her buttocks. It remains friable and bleeds easily with minimal manipulation. Integumentary (Hair, Skin) Wound #14 status is Open. Original cause of wound was Pressure Injury. The date acquired was: 08/23/2021. The wound has been in treatment 6 weeks. The wound is located on the Left Gluteus. The wound measures 0.3cm length x 1cm width x 0.3cm depth; 0.236cm^2 area and 0.071cm^3 volume. There is Fat Layer (Subcutaneous Tissue) exposed. There is no tunneling or undermining noted. There is a medium amount of serosanguineous drainage noted. The wound margin is epibole. There is large (67-100%) red, friable granulation within the wound bed.  There is no necrotic tissue within the wound bed. Assessment Active Problems ICD-10 Pressure ulcer of left buttock, stage 2 Venous insufficiency (chronic) (peripheral) Essential (primary)  hypertension Procedures Wound #14 Pre-procedure diagnosis of Wound #14 is a Pressure Ulcer located on the Left Gluteus . There was a Selective/Open Wound Non-Viable Tissue Debridement with a total area of 0.3 sq cm performed by Fredirick Maudlin, MD. With the following instrument(s): Curette to remove Non-Viable tissue/material. Material removed includes Slough and Biofilm and after achieving pain control using Other (benzocaine 20% spray). No specimens were taken. A time out was conducted at 13:45, prior to the start of the procedure. A Minimum amount of bleeding was controlled with Silver Nitrate. The procedure was tolerated well with a pain level of 5 throughout and a pain level of 4 following the procedure. Post Debridement Measurements: 0.3cm length x 1cm width x 0.3cm depth; 0.071cm^3 volume. Post debridement Stage noted as Category/Stage III. Character of Wound/Ulcer Post Debridement is improved. Post procedure Diagnosis Wound #14: Same as Pre-Procedure Plan Follow-up Appointments: Return Appointment in 2 weeks. - Dr. Celine Ahr Rm 1 Monday 7/3 Bathing/ Shower/ Hygiene: May shower and wash wound with soap and water. Off-Loading: Turn and reposition every 2 hours - stand for a few minutes at least every hour during the day WOUND #14: - Gluteus Wound Laterality: Left Cleanser: Soap and Water 1 x Per Day/30 Days Discharge Instructions: May shower and wash wound with dial antibacterial soap and water prior to dressing change. Prim Dressing: KerraCel Ag Gelling Fiber Dressing, 2x2 in (silver alginate) 1 x Per Day/30 Days ary Discharge Instructions: Apply silver alginate to wound bed as instructed Secondary Dressing: ALLEVYN Gentle Border, 4x4 (in/in) 1 x Per Day/30 Days Discharge Instructions: Apply over  primary dressing as directed. 02/08/2022: The wound was measured at slightly larger today, but to my inspection it appears about the same. It is most unusual and certainly not consistent with a typical pressure ulcer. I think it is potentially related to what ever is causing the purplish discoloration of her buttocks. It remains friable and bleeds easily with minimal manipulation. I used a curette to debride a small amount of slough off of the wound surface. It then began bleeding briskly which I controlled with pressure and silver nitrate. I would very much like to biopsy this wound to get more information about its etiology, but I am concerned that it would bleed excessively and I would not be able to control it in clinic. Hopefully electrocautery will be available to me in the near future at which time I would feel more comfortable proceeding. For now, we will continue using silver alginate. We may consider a dermatology referral in the near future. I will see her back in 2 weeks. Electronic Signature(s) Signed: 02/08/2022 2:03:21 PM By: Fredirick Maudlin MD FACS Previous Signature: 02/08/2022 2:02:33 PM Version By: Fredirick Maudlin MD FACS Previous Signature: 02/08/2022 1:57:59 PM Version By: Fredirick Maudlin MD FACS Entered By: Fredirick Maudlin on 02/08/2022 14:03:21 -------------------------------------------------------------------------------- HxROS Details Patient Name: Date of Service: Katrina Amel D. 02/08/2022 1:15 PM Medical Record Number: [id number] Patient Account Number: [id number] Date of Birth/Sex: Treating RN: 10/07/1931 (88 y.o. [name] Primary Care Provider: [name] Other Clinician: Referring Provider: Treating Provider/Extender: Kathlyn [place] in Treatment: 6 Information Obtained From Patient Eyes Medical History: Negative for: Cataracts; Glaucoma; Optic Neuritis Ear/Nose/Mouth/Throat Medical History: Negative for: Chronic  sinus problems/congestion; Middle ear problems Hematologic/Lymphatic Medical History: Negative for: Anemia; Hemophilia; Human Immunodeficiency Virus; Lymphedema; Sickle Cell Disease Respiratory Medical History: Positive for: Sleep Apnea - does not use Cpap or 02 as ordered Negative for: Aspiration; Asthma; Chronic Obstructive Pulmonary  Disease (COPD); Pneumothorax; Tuberculosis Cardiovascular Medical History: Positive for: Arrhythmia - A-Fib; Hypertension; Phlebitis Negative for: Angina; Congestive Heart Failure; Coronary Artery Disease; Deep Vein Thrombosis; Hypotension; Myocardial Infarction; Peripheral Arterial Disease; Peripheral Venous Disease; Vasculitis Past Medical History Notes: Hyperlipidemia Gastrointestinal Medical History: Negative for: Cirrhosis ; Colitis; Crohns; Hepatitis A; Hepatitis B; Hepatitis C Endocrine Medical History: Negative for: Type I Diabetes; Type II Diabetes Past Medical History Notes: Hypothyroidism Genitourinary Medical History: Negative for: End Stage Renal Disease Immunological Medical History: Negative for: Lupus Erythematosus; Raynauds; Scleroderma Integumentary (Skin) Medical History: Negative for: History of Burn Musculoskeletal Medical History: Negative for: Gout; Rheumatoid Arthritis; Osteoarthritis; Osteomyelitis Neurologic Medical History: Negative for: Dementia; Neuropathy; Quadriplegia; Paraplegia; Seizure Disorder Oncologic Medical History: Negative for: Received Chemotherapy; Received Radiation Psychiatric Medical History: Negative for: Anorexia/bulimia; Confinement Anxiety Immunizations Pneumococcal Vaccine: Received Pneumococcal Vaccination: Yes Received Pneumococcal Vaccination On or After 60th Birthday: No Implantable Devices None Family and Social History Cancer: Yes - Siblings; Heart Disease: Yes - Mother,Father; Hereditary Spherocytosis: No; Hypertension: Yes - Father; Kidney Disease: No; Lung Disease: No;  Seizures: No; Stroke: No; Thyroid Problems: No; Tuberculosis: No; Never smoker; Marital Status - Single; Alcohol Use: Never; Drug Use: No History; Caffeine Use: Rarely; Financial Concerns: No; Food, Clothing or Shelter Needs: No; Support System Lacking: No; Transportation Concerns: No Engineer, maintenance) Signed: 02/08/2022 5:02:10 PM By: Fredirick Maudlin MD FACS Signed: 02/10/2022 6:21:16 PM By: Baruch Gouty RN, BSN Entered By: Fredirick Maudlin on 02/08/2022 13:56:19 -------------------------------------------------------------------------------- [place] Details Patient Name: Date of Service: Katrina Amel D. 02/08/2022 Medical Record Number: [id number] Patient Account Number: [id number] Date of Birth/Sex: Treating RN: 1932/06/19 (88 y.o. [name] Primary Care Provider: [name] Other Clinician: Referring Provider: Treating Provider/Extender: Kathlyn [place] in Treatment: 6 Diagnosis Coding ICD-10 Codes Code Description [phone] Pressure ulcer of left buttock, stage 2 I87.2 Venous insufficiency (chronic) (peripheral) I10 Essential (primary) hypertension Facility Procedures CPT4 Code: [id number] Description: 7597 - DEBRIDE WOUND 1ST 20 SQ CM OR < ICD-10 Diagnosis Description L89.322 Pressure ulcer of left buttock, stage 2 Modifier: Quantity: 1 Physician Procedures : CPT4 Code Description Modifier [id number] - WC PHYS LEVEL 3 - EST PT 25 ICD-10 Diagnosis Description L89.322 Pressure ulcer of left buttock, stage 2 I87.2 Venous insufficiency (chronic) (peripheral) I10 Essential (primary) hypertension Quantity: 1 : [id number] - WC PHYS DEBR WO ANESTH 20 SQ CM ICD-10 Diagnosis Description L89.322 Pressure ulcer of left buttock, stage 2 Quantity: 1 Electronic Signature(s) Signed: 02/08/2022 2:02:52 PM By: Fredirick Maudlin MD FACS Entered By: Fredirick Maudlin on 02/08/2022 14:02:52

## 2022-02-10 NOTE — Progress Notes (Signed)
FRANKYE, SCHWEGEL ([id number]) Visit Report for 02/08/2022 Arrival Information Details Patient Name: Date of Service: Katrina [name], Katrina D. 02/08/2022 1:15 PM Medical Record Number: [id number] Patient Account Number: [id number] Date of Birth/Sex: Treating RN: 1932-02-27 (88 y.o. Donney Rankins, Lovena Le Primary Care Twala Collings: [name] Other Clinician: Referring Shamari Lofquist: Treating Zev Blue/Extender: Kathlyn [place] in Treatment: 6 Visit Information History Since Last Visit Added or deleted any medications: No Patient Arrived: [name] Any new allergies or adverse reactions: No Arrival Time: 13:27 Had a fall or experienced change in No Accompanied By: self activities of daily living that may affect Transfer Assistance: None risk of falls: Patient Identification Verified: Yes Signs or symptoms of abuse/neglect since last visito No Secondary Verification Process Completed: Yes Hospitalized since last visit: No Patient Requires Transmission-Based Precautions: No Implantable device outside of the clinic excluding No Patient Has Alerts: No cellular tissue based products placed in the center since last visit: Has Dressing in Place as Prescribed: Yes Pain Present Now: No Electronic Signature(s) Signed: 02/08/2022 5:25:20 PM By: Adline Peals Entered By: Adline Peals on 02/08/2022 13:31:41 -------------------------------------------------------------------------------- Encounter Discharge Information Details Patient Name: Date of Service: Katrina Amel D. 02/08/2022 1:15 PM Medical Record Number: [id number] Patient Account Number: [id number] Date of Birth/Sex: Treating RN: 15-Jul-1932 (88 y.o. Harlow Ohms Primary Care Kalen Neidert: [name] Other Clinician: Referring Gennifer Potenza: Treating Gershon Shorten/Extender: Kathlyn [place] in Treatment: 6 Encounter Discharge Information Items Post Procedure Vitals Discharge Condition:  Stable Temperature (F): 98.7 Ambulatory Status: Cane Pulse (bpm): 66 Discharge Destination: [place] Respiratory Rate (breaths/min): 18 Orders Sent: Yes Blood Pressure (mmHg): 179/69 Transportation: Private Auto Accompanied By: self Schedule Follow-up Appointment: Yes Clinical Summary of Care: Patient Declined Electronic Signature(s) Signed: 02/08/2022 5:25:20 PM By: Adline Peals Entered By: Adline Peals on 02/08/2022 17:18:57 -------------------------------------------------------------------------------- Lower Extremity Assessment Details Patient Name: Date of Service: Katrina Amel D. 02/08/2022 1:15 PM Medical Record Number: [id number] Patient Account Number: [id number] Date of Birth/Sex: Treating RN: 1932/02/10 (88 y.o. Harlow Ohms Primary Care Kienna Moncada: [name] Other Clinician: Referring Artemis Koller: Treating Eban Weick/Extender: Kathlyn [place] in Treatment: 6 Electronic Signature(s) Signed: 02/08/2022 5:25:20 PM By: Sabas Sous By: Adline Peals on 02/08/2022 13:31:51 -------------------------------------------------------------------------------- Multi Wound Chart Details Patient Name: Date of Service: Katrina Amel D. 02/08/2022 1:15 PM Medical Record Number: [id number] Patient Account Number: [id number] Date of Birth/Sex: Treating RN: October 17, 1931 (88 y.o. [name] Primary Care Reesa Gotschall: [name] Other Clinician: Referring Leiya Keesey: Treating Naveh Rickles/Extender: Kathlyn [place] in Treatment: 6 Vital Signs Height(in): Pulse(bpm): 73 Weight(lbs): Blood Pressure(mmHg): 179/69 Body Mass Index(BMI): Temperature(F): 98.7 Respiratory Rate(breaths/min): 18 Photos: [N/A:N/A] Left Gluteus N/A N/A Wound Location: Pressure Injury N/A N/A Wounding Event: Pressure Ulcer N/A N/A Primary Etiology: Sleep Apnea, Arrhythmia, N/A N/A Comorbid  History: Hypertension, Phlebitis 08/23/2021 N/A N/A Date Acquired: 6 N/A N/A Weeks of Treatment: Open N/A N/A Wound Status: No N/A N/A Wound Recurrence: 0.3x1x0.3 N/A N/A Measurements L x W x D (cm) 0.236 N/A N/A A (cm) : rea 0.071 N/A N/A Volume (cm) : 81.20% N/A N/A % Reduction in A rea: 71.70% N/A N/A % Reduction in Volume: Category/Stage III N/A N/A Classification: Medium N/A N/A Exudate A mount: Serosanguineous N/A N/A Exudate Type: red, brown N/A N/A Exudate Color: Epibole N/A N/A Wound Margin: Large (67-100%) N/A N/A Granulation A mount: Red, Friable N/A N/A Granulation Quality: None Present (0%) N/A N/A Necrotic A mount: Fat Layer (Subcutaneous Tissue): Yes N/A N/A Exposed Structures:  Fascia: No Tendon: No Muscle: No Joint: No Bone: No Small (1-33%) N/A N/A Epithelialization: Debridement - Selective/Open Wound N/A N/A Debridement: Pre-procedure Verification/Time Out 13:45 N/A N/A Taken: Other N/A N/A Pain Control: Slough N/A N/A Tissue Debrided: Non-Viable Tissue N/A N/A Level: 0.3 N/A N/A Debridement A (sq cm): rea Curette N/A N/A Instrument: Minimum N/A N/A Bleeding: Silver Nitrate N/A N/A Hemostasis A chieved: 5 N/A N/A Procedural Pain: 4 N/A N/A Post Procedural Pain: Procedure was tolerated well N/A N/A Debridement Treatment Response: 0.3x1x0.3 N/A N/A Post Debridement Measurements L x W x D (cm) 0.071 N/A N/A Post Debridement Volume: (cm) Category/Stage III N/A N/A Post Debridement Stage: Debridement N/A N/A Procedures Performed: Treatment Notes Electronic Signature(s) Signed: 02/08/2022 1:55:12 PM By: Fredirick Maudlin MD FACS Signed: 02/10/2022 6:21:16 PM By: Baruch Gouty RN, BSN Entered By: Fredirick Maudlin on 02/08/2022 13:55:12 -------------------------------------------------------------------------------- Multi-Disciplinary Care Plan Details Patient Name: Date of Service: Katrina Amel D. 02/08/2022 1:15  PM Medical Record Number: [id number] Patient Account Number: [id number] Date of Birth/Sex: Treating RN: [birth year] (88 y.o. Harlow Ohms Primary Care Polo Mcmartin: [name] Other Clinician: Referring Maeola Mchaney: Treating Herberth Deharo/Extender: Kathlyn [place] in Treatment: [place] reviewed with physician Active Inactive Abuse / Safety / Falls / Self Care Management Nursing Diagnoses: Potential for falls Goals: Patient/caregiver will verbalize/demonstrate measures taken to prevent injury and/or falls Date Initiated: 12/28/2021 Target Resolution Date: 02/22/2022 Goal Status: Active Interventions: Assess fall risk on admission and as needed Assess impairment of mobility on admission and as needed per policy Notes: Nutrition Nursing Diagnoses: Imbalanced nutrition Potential for alteratiion in Nutrition/Potential for imbalanced nutrition Goals: Patient/caregiver agrees to and verbalizes understanding of need to use nutritional supplements and/or vitamins as prescribed Date Initiated: 12/28/2021 Target Resolution Date: 02/22/2022 Goal Status: Active Interventions: Assess patient nutrition upon admission and as needed per policy Provide education on nutrition Treatment Activities: Patient referred to Primary Care Physician for further nutritional evaluation : 12/28/2021 Notes: Pressure Nursing Diagnoses: Knowledge deficit related to causes and risk factors for pressure ulcer development Knowledge deficit related to management of pressures ulcers Potential for impaired tissue integrity related to pressure, friction, moisture, and shear Goals: Patient/caregiver will verbalize understanding of pressure ulcer management Date Initiated: 12/28/2021 Target Resolution Date: 02/22/2022 Goal Status: Active Interventions: Assess: immobility, friction, shearing, incontinence upon admission and as needed Assess potential for pressure ulcer upon  admission and as needed Notes: Wound/Skin Impairment Nursing Diagnoses: Impaired tissue integrity Knowledge deficit related to ulceration/compromised skin integrity Goals: Patient/caregiver will verbalize understanding of skin care regimen Date Initiated: 12/28/2021 Target Resolution Date: 02/22/2022 Goal Status: Active Ulcer/skin breakdown will have a volume reduction of 30% by week 4 Date Initiated: 12/28/2021 Date Inactivated: 01/25/2022 Target Resolution Date: 01/25/2022 Goal Status: Met Ulcer/skin breakdown will have a volume reduction of 50% by week 8 Date Initiated: 01/25/2022 Target Resolution Date: 02/22/2022 Goal Status: Active Interventions: Assess patient/caregiver ability to obtain necessary supplies Assess patient/caregiver ability to perform ulcer/skin care regimen upon admission and as needed Assess ulceration(s) every visit Provide education on ulcer and skin care Treatment Activities: Skin care regimen initiated : 12/28/2021 Topical wound management initiated : 12/28/2021 Notes: Electronic Signature(s) Signed: 02/08/2022 5:25:20 PM By: Adline Peals Entered By: Adline Peals on 02/08/2022 13:39:26 -------------------------------------------------------------------------------- Pain Assessment Details Patient Name: Date of Service: Katrina Amel D. 02/08/2022 1:15 PM Medical Record Number: [id number] Patient Account Number: [id number] Date of Birth/Sex: Treating RN: December 16, 1931 (88 y.o. Harlow Ohms Primary Care Darsh Vandevoort: [name] Other Clinician: Referring  Adonte Vanriper: Treating Jaymian Bogart/Extender: Kathlyn [place] in Treatment: 6 Active Problems Location of Pain Severity and Description of Pain Patient Has Paino No Site Locations Rate the pain. Current Pain Level: 0 Pain Management and Medication Current Pain Management: Electronic Signature(s) Signed: 02/08/2022 5:25:20 PM By: Adline Peals Entered By: Adline Peals on 02/08/2022 13:31:48 -------------------------------------------------------------------------------- Patient/Caregiver Education Details Patient Name: Date of Service: [place] 6/19/2023andnbsp1:15 PM Medical Record Number: [id number] Patient Account Number: [id number] Date of Birth/Gender: Treating RN: 01/04/1932 (88 y.o. Harlow Ohms Primary Care Physician: [name] Other Clinician: Referring Physician: Treating Physician/Extender: Kathlyn [place] in Treatment: 6 Education Assessment Education Provided To: Patient Education Topics Provided Wound/Skin Impairment: Methods: Explain/Verbal Responses: Reinforcements needed, State content correctly Electronic Signature(s) Signed: 02/08/2022 5:25:20 PM By: Adline Peals Entered By: Adline Peals on 02/08/2022 13:39:38 -------------------------------------------------------------------------------- Wound Assessment Details Patient Name: Date of Service: Katrina Amel D. 02/08/2022 1:15 PM Medical Record Number: [id number] Patient Account Number: [id number] Date of Birth/Sex: Treating RN: 1932-03-26 (88 y.o. Harlow Ohms Primary Care Annalia Metzger: [name] Other Clinician: Referring Tedd Cottrill: Treating Ebrima Ranta/Extender: Kathlyn [place] in Treatment: 6 Wound Status Wound Number: 14 Primary Etiology: Pressure Ulcer Wound Location: Left Gluteus Wound Status: Open Wounding Event: Pressure Injury Comorbid History: Sleep Apnea, Arrhythmia, Hypertension, Phlebitis Date Acquired: 08/23/2021 Weeks Of Treatment: 6 Clustered Wound: No Photos Wound Measurements Length: (cm) 0.3 Width: (cm) 1 Depth: (cm) 0.3 Area: (cm) 0.236 Volume: (cm) 0.071 % Reduction in Area: 81.2% % Reduction in Volume: 71.7% Epithelialization: Small (1-33%) Tunneling: No Undermining: No Wound Description Classification: Category/Stage III Wound Margin:  Epibole Exudate Amount: Medium Exudate Type: Serosanguineous Exudate Color: red, brown Foul Odor After Cleansing: No Slough/Fibrino No Wound Bed Granulation Amount: Large (67-100%) Exposed Structure Granulation Quality: Red, Friable Fascia Exposed: No Necrotic Amount: None Present (0%) Fat Layer (Subcutaneous Tissue) Exposed: Yes Tendon Exposed: No Muscle Exposed: No Joint Exposed: No Bone Exposed: No Treatment Notes Wound #14 (Gluteus) Wound Laterality: Left Cleanser Soap and Water Discharge Instruction: May shower and wash wound with dial antibacterial soap and water prior to dressing change. Peri-Wound Care Topical Primary Dressing KerraCel Ag Gelling Fiber Dressing, 2x2 in (silver alginate) Discharge Instruction: Apply silver alginate to wound bed as instructed Secondary Dressing ALLEVYN Gentle Border, 4x4 (in/in) Discharge Instruction: Apply over primary dressing as directed. Secured With Compression Wrap Compression Stockings Add-Ons Electronic Signature(s) Signed: 02/08/2022 5:25:20 PM By: Adline Peals Entered By: Adline Peals on 02/08/2022 13:40:44 -------------------------------------------------------------------------------- Vitals Details Patient Name: Date of Service: Katrina Amel D. 02/08/2022 1:15 PM Medical Record Number: [id number] Patient Account Number: [id number] Date of Birth/Sex: Treating RN: [birth year] (88 y.o. Harlow Ohms Primary Care Ottilie Wigglesworth: [name] Other Clinician: Referring Glynda Soliday: Treating Marisabel Macpherson/Extender: Kathlyn [place] in Treatment: 6 Vital Signs Time Taken: 15:33 Temperature (F): 98.7 Pulse (bpm): 66 Respiratory Rate (breaths/min): 18 Blood Pressure (mmHg): 179/69 Reference Range: 80 - 120 mg / dl Electronic Signature(s) Signed: 02/08/2022 5:25:20 PM By: Adline Peals Entered By: Adline Peals on 02/08/2022 13:33:43

## 2022-02-22 ENCOUNTER — Encounter (HOSPITAL_BASED_OUTPATIENT_CLINIC_OR_DEPARTMENT_OTHER): Payer: Medicare Other | Attending: General Surgery | Admitting: General Surgery

## 2022-02-22 DIAGNOSIS — I890 Lymphedema, not elsewhere classified: Secondary | ICD-10-CM | POA: Insufficient documentation

## 2022-02-22 DIAGNOSIS — L89322 Pressure ulcer of left buttock, stage 2: Secondary | ICD-10-CM | POA: Insufficient documentation

## 2022-02-22 DIAGNOSIS — I10 Essential (primary) hypertension: Secondary | ICD-10-CM | POA: Diagnosis not present

## 2022-02-22 DIAGNOSIS — I872 Venous insufficiency (chronic) (peripheral): Secondary | ICD-10-CM | POA: Diagnosis not present

## 2022-02-22 NOTE — Progress Notes (Signed)
Katrina, [name] ([id number]) Visit Report for 02/22/2022 Chief Complaint Document Details Patient Name: Date of Service: [name], [name] 02/22/2022 1:15 PM Medical Record Number: [id number] Patient Account Number: [id number] Date of Birth/Sex: Treating RN: November 13, 1931 (88 y.o. [name] Primary Care Provider: [name] Other Clinician: Referring Provider: Treating Provider/Extender: Kathlyn Sacramento in Treatment: 8 Information Obtained from: Patient Chief Complaint Patient returns to the wound care center for reopened ulcer to: Left buttock Electronic Signature(s) Signed: 02/22/2022 2:09:23 PM By: Fredirick Maudlin MD FACS Entered By: Fredirick Maudlin on 02/22/2022 14:09:23 -------------------------------------------------------------------------------- HPI Details Patient Name: Date of Service: Katrina Amel D. 02/22/2022 1:15 PM Medical Record Number: [id number] Patient Account Number: [id number] Date of Birth/Sex: Treating RN: November 06, 1931 (88 y.o. [name] Primary Care Provider: [name] Other Clinician: Referring Provider: Treating Provider/Extender: Kathlyn Sacramento in Treatment: 8 History of Present Illness Location: right lower extremity Quality: Patient reports experiencing a dull pain to affected area(s). Severity: Patient states wound(s) are getting better. Duration: Patient has had the wound for 3 months prior to presenting for treatment Context: The wound would happen gradually Modifying Factors: Patient wound(s)/ulcer(s) are worsening due to :standing for a long while. HPI Description: Past medical history of pulmonary emboli, hypothyroidism, hyperlipidemia, hypertension, sleep apnea, gallstones, ulcerated lower extremities, atrial fibrillation, status post total knee replacement, cholecystectomy, cystoscopy and stent placement for kidney stones. She has never been a smoker. About a year ago she was seen  by [name] with an ulcer of her right lower extremity which she's had for about over 3 months. On 10/23/14 -- Lower extremity venous duplex evaluation. She had no evidence of deep venous thromboses involving both lower extremities. There was evidence of deep vein reflux in the right and left common femoral veins. She also had evidence of great saphenous vein reflux noted only at the level of the saphenofemoral junction. No evidence of greater or small saphenous vein reflux. She will need a consult to the vascular surgeon. Seen by Dr. Mathis Fare office note dated 11/13/2014. He noted that she had good arterial blood flow with palpable DP pulses bilaterally. Based on the patient's history Dr. Scot Dock recommended elevation when at rest of both feet and knees and ankles above heart level in the supine position. Activity such as walking and pool exercises were recommended. She will also have 15-20 mm thigh-high compression stockings daily once the ulcers have completely healed. A prescription was given to her. 11/04/2015 -- the wound on her right lower extremity has completely healed and her edema has gone down significantly. However in her popliteal fossa where she's got a lot of fungal infection there is an area which is now an open wound and this will need attention. 12/02/2015 -- the right lower extremity looks much better than the edema has gone down significantly however she continues to have significant edema on the left side today. 12/09/2015 -- the patient has been doing her dressings as advised and says that overall she feels much better. She has received her juxta light for the left lower extremity READMISSION 06/25/2021 This is a patient who is actually had several previous visits to our clinic in 2014, 2016 and more recently 2017. This was largely because of wounds on her lower legs in the setting of chronic venous insufficiency and secondary lymphedema. Looking at my records she was apparently  prescribed thigh-high compression stockings probably by vein and vascular although she comes back in not having worn any stockings in  quite a period of time. [name] She thinks her wounds have been there since April. This includes the left lateral, right anterior and right posterior lateral parts of both lower legs. She also has a stage II pressure ulcer on her left glued I think which is happened because of sleeping in a lift chair. She had ABDs and kerlix on her bilateral lower legs. Past medical history reviewed she has hypothyroidism, left buttock pressure ulcer dating back to March of this year, iron deficiency, falls, paroxysmal atrial fibrillation anticoagulated and chronic venous insufficiency ABIs in our clinic were 0.86 on the right and 0.96 on the left 11/10; patient comes into clinic today after being admitted last week with uncontrolled lymphedema, severe venous insufficiency and some superficial wounds on her bilateral lower legs. We put her in 3 layer compression and she comes in today with all the wounds closed and considerable improvement in the condition of her lower legs. The patient is going to need lifelong compression stockings probably juxta lites and we are going to go ahead and order those for her today. Unfortunately the POA here is a niece in [place] were probably going to have to talk with her to arrange proper payment for the juxta lite stockings. [facility] the [name] She does not feel she can get the stockings on independently I am hopeful that the nurse and aids at the assisted living can help her with this otherwise this may be a problem. She also has an area on her left buttock which is a stage II wound. A lot of unhealthy irritated skin around this I think from moisture prolonged sittingo Incontinence 11/17; the patient's area on the left  buttock is just about closed although there is still a lot of unhealthy looking discolored skin around a large area here. I suspect this is chronic moisture, pressure, perhaps incontinence. she has minuscule open areas on her bilateral lower legs. Apparently our intermediary did not get the order for juxta lite stockings therefore she does not have any. 12/1; both leg wounds are healed and she has been wearing bilateral juxta lite stockings for 2 days. [facility] her with this. The left buttock wound unfortunately has deteriorated 12/15; 2-week follow-up. Her legs remain healed she has the other area which is on her left buttock. This is surrounded circumferentially by chronically irritated skin. Mirror image on the right side as well. I suspect this is chronic pressure friction and moisture from incontinence. This makes it difficult to get this area to completely close. We have been using silver alginate 12/29 both her legs remain closed and she has bilateral juxta lite stockings. The real problem here is her left buttock she has a small wound in the mid part of a extremely irritated dermatitis. It almost looks bruised in spots. There is superficial loss of epithelium. The actual wound that we have been dealing with does not look so bad. I have been assuming this is some combination of friction pressure wetness from constant urinary incontinence. If there is a dermatologic issue outside of that here I am not seeing it 09/03/2021; patient with a small area on the left buttock. The real problem here is a marked surrounding erythematous dermatitis with loss of surface epithelium. It looks as though she is beginning to develop satellite lesions this may be mostly fungal 1/26; the area  on her left buttock is closed however she still has very irritated macerated skin in this area. Some of this appears fungal. I treated this with ketoconazole last time and I think it looks somewhat  better although there is still some area present that still looks like a fungal dermatitis. Everything on the right buttock looks a lot better. READMISSION 12/28/2021: The patient returns today with a reopening of a wound on her left buttock. She says it has been present for about 6 months; I am not sure how this is possible since she was seen for similar in January and subsequently discharged from the clinic. Nonetheless, she has some purplish discoloration of her buttock with an open area and some scarring around the perimeter of a shallow stage II ulcer on her buttock. According to the previous clinic notes, there was some question as to whether or not this was due to fungal breakdown of her skin. She currently resides at [facility], in the assisted living section. She says that she cannot see the site and is unable to care for it herself. 01/11/2022: The wound is a little bit smaller today but has a very friable surface. It is an odd wound. On further inspection, it is almost as if there are dilated vessels and the underlying skin as I can compress the surrounding tissue, it dents in, and then fills back up, as if with fluid or blood. She did have an episode during the week when she had a lot of blood come from the wound, but is not actively bleeding today. 01/25/2022: No significant change to the wound. She continues to have purple discoloration of her buttocks, but it does still blanch. 02/08/2022: The wound was measured at slightly larger today, but to my inspection it appears about the same. It is most unusual and certainly not consistent with a typical pressure ulcer. I think it is potentially related to what ever is causing the purplish discoloration of her buttocks. Remains friable and bleeds easily with minimal manipulation. 02/22/2022: No real difference today. The base is clean without any accumulation of slough or hypertrophic granulation tissue. I still think this may be a dermatological  issue such as a venous lake or some variant of telangiectasia. Electronic Signature(s) Signed: 02/22/2022 2:10:11 PM By: Fredirick Maudlin MD FACS Entered By: Fredirick Maudlin on 02/22/2022 14:10:11 -------------------------------------------------------------------------------- Physical Exam Details Patient Name: Date of Service: Katrina Amel D. 02/22/2022 1:15 PM Medical Record Number: [id number] Patient Account Number: [id number] Date of Birth/Sex: Treating RN: 1932-05-20 (88 y.o. [name] Primary Care Provider: [name] Other Clinician: Referring Provider: Treating Provider/Extender: Kathlyn Sacramento in Treatment: 8 Constitutional Hypertensive, asymptomatic. . . . No acute distress.Marland Kitchen Respiratory Normal work of breathing on room air.. Notes 02/22/2022: There has been no change in the wound. Electronic Signature(s) Signed: 02/22/2022 2:13:37 PM By: Fredirick Maudlin MD FACS Entered By: Fredirick Maudlin on 02/22/2022 14:13:37 -------------------------------------------------------------------------------- Physician Orders Details Patient Name: Date of Service: Katrina Amel D. 02/22/2022 1:15 PM Medical Record Number: [id number] Patient Account Number: [id number] Date of Birth/Sex: Treating RN: December 18, 1931 (88 y.o. [name] Primary Care Provider: [name] Other Clinician: Referring Provider: Treating Provider/Extender: Kathlyn Sacramento in Treatment: 8 Verbal / Phone Orders: No Diagnosis Coding ICD-10 Coding Code Description [phone] Pressure ulcer of left buttock, stage 2 I87.2 Venous insufficiency (chronic) (peripheral) I10 Essential (primary) hypertension Follow-up Appointments Return appointment in 3 weeks. - Dr. Celine Ahr RM 1 Monday 7/24 @ 1:15 pm  Bathing/ Shower/ Hygiene May shower and wash wound with soap and water. Off-Loading Turn and reposition every 2 hours - stand for a few minutes at least  every hour during the day Wound Treatment Wound #14 - Gluteus Wound Laterality: Left Cleanser: Soap and Water 1 x Per Day/30 Days Discharge Instructions: May shower and wash wound with dial antibacterial soap and water prior to dressing change. Prim Dressing: KerraCel Ag Gelling Fiber Dressing, 2x2 in (silver alginate) 1 x Per Day/30 Days ary Discharge Instructions: Apply silver alginate to wound bed as instructed Secondary Dressing: ALLEVYN Gentle Border, 4x4 (in/in) 1 x Per Day/30 Days Discharge Instructions: Apply over primary dressing as directed. 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 02/22/2022 1:15 PM Medical Record Number: [id number] Patient Account Number: [id number] Date of Birth/Sex: Treating RN: 1931/09/19 (88 y.o. [name] Primary Care Provider: [name] Other Clinician: Referring Provider: Treating Provider/Extender: Kathlyn Sacramento in Treatment: 8 Active Problems ICD-10 Encounter Code Description Active Date MDM Diagnosis [phone] Pressure ulcer of left buttock, stage 2 12/28/2021 No Yes I87.2 Venous insufficiency (chronic) (peripheral) 12/28/2021 No Yes I10 Essential (primary) hypertension 12/28/2021 No Yes Inactive Problems Resolved Problems Electronic Signature(s) Signed: 02/22/2022 2:09:07 PM By: Fredirick Maudlin MD FACS Entered By: Fredirick Maudlin on 02/22/2022 14:09:07 -------------------------------------------------------------------------------- Progress Note Details Patient Name: Date of Service: Katrina Amel D. 02/22/2022 1:15 PM Medical Record Number: [id number] Patient Account Number: [id number] Date of Birth/Sex: Treating RN: May 24, 1932 (88 y.o. [name] Primary Care Provider: [name] Other Clinician: Referring Provider: Treating Provider/Extender: Kathlyn Sacramento in Treatment: 8 Subjective Chief Complaint Information obtained from Patient Patient returns to the wound care center for reopened ulcer to: Left buttock History of Present Illness (HPI) The following HPI elements were documented for the patient's wound: Location: right lower extremity Quality: Patient reports experiencing a dull pain to affected area(s). Severity: Patient states wound(s) are getting better. Duration: Patient has had the wound for 3 months prior to presenting for treatment Context: The wound would happen gradually Modifying Factors: Patient wound(s)/ulcer(s) are worsening due to :standing for a long while. Past medical history of pulmonary emboli, hypothyroidism, hyperlipidemia, hypertension, sleep apnea, gallstones, ulcerated lower extremities, atrial fibrillation, status post total knee replacement, cholecystectomy, cystoscopy and  stent placement for kidney stones. She has never been a smoker. About a year ago she was seen by [name] with an ulcer of her right lower extremity which she's had for about over 3 months. On 10/23/14 -- Lower extremity venous duplex evaluation. She had no evidence of deep venous thromboses involving both lower extremities. There was evidence of deep vein reflux in the right and left  common femoral veins. She also had evidence of great saphenous vein reflux noted only at the level of the saphenofemoral junction. No evidence of greater or small saphenous vein reflux. She will need a consult to the vascular surgeon. Seen by Dr. Mathis Fare office note dated 11/13/2014. He noted that she had good arterial blood flow with palpable DP pulses bilaterally. Based on the patient's history Dr. Scot Dock recommended elevation when at rest of both feet and knees and ankles above heart level in the supine position. Activity such as walking and pool exercises were recommended. She will also have 15-20 mm thigh-high compression stockings daily once the ulcers have completely healed. A prescription was given to her. 11/04/2015 -- the wound on her right lower extremity has completely healed and her edema has gone down significantly. However in her popliteal fossa where she's got a lot of fungal infection there is an area which is now an open wound and this will need attention. 12/02/2015 -- the right lower extremity looks much better than the edema has gone down significantly however she continues to have significant edema on the left side today. 12/09/2015 -- the patient has been doing her dressings as advised and says that overall she feels much better. She has received her juxta light for the left lower extremity READMISSION 06/25/2021 This is a patient who is actually had several previous visits to our clinic in 2014, 2016 and more recently 2017. This was largely because of wounds on her lower legs in the setting of  chronic venous insufficiency and secondary lymphedema. Looking at my records she was apparently prescribed thigh-high compression stockings probably by vein and vascular although she comes back in not having worn any stockings in quite a period of time. [name] She thinks her wounds have been there since April. This includes the left lateral, right anterior and right posterior lateral parts of both lower legs. She also has a stage II pressure ulcer on her left glued I think which is happened because of sleeping in a lift chair. She had ABDs and kerlix on her bilateral lower legs. Past medical history reviewed she has hypothyroidism, left buttock pressure ulcer dating back to March of this year, iron deficiency, falls, paroxysmal atrial fibrillation anticoagulated and chronic venous insufficiency ABIs in our clinic were 0.86 on the right and 0.96 on the left 11/10; patient comes into clinic today after being admitted last week with uncontrolled lymphedema, severe venous insufficiency and some superficial wounds on her bilateral lower legs. We put her in 3 layer compression and she comes in today with all the wounds closed and considerable improvement in the condition of her lower legs. The patient is going to need lifelong compression stockings probably juxta lites and we are going to go ahead and order those for her today. Unfortunately the POA here is a niece in [place] were probably going to have to talk with her to arrange proper payment for the juxta lite stockings. [facility] the [name] She does not feel she can get the stockings on independently I am hopeful that the nurse and aids at the assisted living can help her with this otherwise this may be a problem. She also has an area on her left buttock which is a stage II wound. A lot of unhealthy irritated skin around this I  think from moisture prolonged sittingo Incontinence 11/17; the patient's  area on the left buttock is just about closed although there is still a lot of unhealthy looking discolored skin around a large area here. I suspect this is chronic moisture, pressure, perhaps incontinence. she has minuscule open areas on her bilateral lower legs. Apparently our intermediary did not get the order for juxta lite stockings therefore she does not have any. 12/1; both leg wounds are healed and she has been wearing bilateral juxta lite stockings for 2 days. [facility] her with this. The left buttock wound unfortunately has deteriorated 12/15; 2-week follow-up. Her legs remain healed she has the other area which is on her left buttock. This is surrounded circumferentially by chronically irritated skin. Mirror image on the right side as well. I suspect this is chronic pressure friction and moisture from incontinence. This makes it difficult to get this area to completely close. We have been using silver alginate 12/29 both her legs remain closed and she has bilateral juxta lite stockings. The real problem here is her left buttock she has a small wound in the mid part of a extremely irritated dermatitis. It almost looks bruised in spots. There is superficial loss of epithelium. The actual wound that we have been dealing with does not look so bad. I have been assuming this is some combination of friction pressure wetness from constant urinary incontinence. If there is a dermatologic issue outside of that here I am not seeing it 09/03/2021; patient with a small area on the left buttock. The real problem here is a marked surrounding erythematous dermatitis with loss of surface epithelium. It looks as though she is beginning to develop satellite lesions this may be mostly fungal 1/26; the area on her left buttock is closed however she still has very irritated macerated skin in this area. Some of  this appears fungal. I treated this with ketoconazole last time and I think it looks somewhat better although there is still some area present that still looks like a fungal dermatitis. Everything on the right buttock looks a lot better. READMISSION 12/28/2021: The patient returns today with a reopening of a wound on her left buttock. She says it has been present for about 6 months; I am not sure how this is possible since she was seen for similar in January and subsequently discharged from the clinic. Nonetheless, she has some purplish discoloration of her buttock with an open area and some scarring around the perimeter of a shallow stage II ulcer on her buttock. According to the previous clinic notes, there was some question as to whether or not this was due to fungal breakdown of her skin. She currently resides at [facility], in the assisted living section. She says that she cannot see the site and is unable to care for it herself. 01/11/2022: The wound is a little bit smaller today but has a very friable surface. It is an odd wound. On further inspection, it is almost as if there are dilated vessels and the underlying skin as I can compress the surrounding tissue, it dents in, and then fills back up, as if with fluid or blood. She did have an episode during the week when she had a lot of blood come from the wound, but is not actively bleeding today. 01/25/2022: No significant change to the wound. She continues to have purple discoloration of her buttocks, but it does still blanch. 02/08/2022: The wound was measured at slightly larger today, but to my inspection it appears about  the same. It is most unusual and certainly not consistent with a typical pressure ulcer. I think it is potentially related to what ever is causing the purplish discoloration of her buttocks. Remains friable and bleeds easily with minimal manipulation. 02/22/2022: No real difference today. The base is clean without any  accumulation of slough or hypertrophic granulation tissue. I still think this may be a dermatological issue such as a venous lake or some variant of telangiectasia. Patient History Information obtained from Patient. Family History Cancer - Siblings, Heart Disease - Mother,Father, Hypertension - Father, No family history of Hereditary Spherocytosis, Kidney Disease, Lung Disease, Seizures, Stroke, Thyroid Problems, Tuberculosis. Social History Never smoker, Marital Status - Single, Alcohol Use - Never, Drug Use - No History, Caffeine Use - Rarely. Medical History Eyes Denies history of Cataracts, Glaucoma, Optic Neuritis Ear/Nose/Mouth/Throat Denies history of Chronic sinus problems/congestion, Middle ear problems Hematologic/Lymphatic Denies history of Anemia, Hemophilia, Human Immunodeficiency Virus, Lymphedema, Sickle Cell Disease Respiratory Patient has history of Sleep Apnea - does not use Cpap or 02 as ordered Denies history of Aspiration, Asthma, Chronic Obstructive Pulmonary Disease (COPD), Pneumothorax, Tuberculosis Cardiovascular Patient has history of Arrhythmia - A-Fib, Hypertension, Phlebitis Denies history of Angina, Congestive Heart Failure, Coronary Artery Disease, Deep Vein Thrombosis, Hypotension, Myocardial Infarction, Peripheral Arterial Disease, Peripheral Venous Disease, Vasculitis Gastrointestinal Denies history of Cirrhosis , Colitis, Crohnoos, Hepatitis A, Hepatitis B, Hepatitis C Endocrine Denies history of Type I Diabetes, Type II Diabetes Genitourinary Denies history of End Stage Renal Disease Immunological Denies history of Lupus Erythematosus, Raynaudoos, Scleroderma Integumentary (Skin) Denies history of History of Burn Musculoskeletal Denies history of Gout, Rheumatoid Arthritis, Osteoarthritis, Osteomyelitis Neurologic Denies history of Dementia, Neuropathy, Quadriplegia, Paraplegia, Seizure Disorder Oncologic Denies history of Received  Chemotherapy, Received Radiation Psychiatric Denies history of Anorexia/bulimia, Confinement Anxiety Medical A Surgical History Notes nd Cardiovascular Hyperlipidemia Endocrine Hypothyroidism Objective Constitutional Hypertensive, asymptomatic. No acute distress.. Vitals Time Taken: 1:47 PM, Temperature: 98.2 F, Pulse: 68 bpm, Respiratory Rate: 18 breaths/min, Blood Pressure: 150/80 mmHg. Respiratory Normal work of breathing on room air.. General Notes: 02/22/2022: There has been no change in the wound. Integumentary (Hair, Skin) Wound #14 status is Open. Original cause of wound was Pressure Injury. The date acquired was: 08/23/2021. The wound has been in treatment 8 weeks. The wound is located on the Left Gluteus. The wound measures 0.3cm length x 0.8cm width x 0.2cm depth; 0.188cm^2 area and 0.038cm^3 volume. There is Fat Layer (Subcutaneous Tissue) exposed. There is no tunneling or undermining noted. There is a small amount of serosanguineous drainage noted. The wound margin is epibole. There is large (67-100%) red, pale granulation within the wound bed. There is no necrotic tissue within the wound bed. Assessment Active Problems ICD-10 Pressure ulcer of left buttock, stage 2 Venous insufficiency (chronic) (peripheral) Essential (primary) hypertension Plan Follow-up Appointments: Return appointment in 3 weeks. - Dr. Celine Ahr RM 1 Monday 7/24 @ 1:15 pm Bathing/ Shower/ Hygiene: May shower and wash wound with soap and water. 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 Prim Dressing:  KerraCel Ag Gelling Fiber Dressing, 2x2 in (silver alginate) 1 x Per Day/30 Days  ary Discharge Instructions: Apply silver alginate to wound bed as instructed Secondary Dressing: ALLEVYN Gentle Border, 4x4 (in/in) 1 x Per Day/30 Days Discharge Instructions: Apply over primary dressing as directed. 02/22/2022: There has been no change to the wound. No debridement was necessary. Based upon the appearance of the site and the surrounding tissues, I suspect there is some sort of cutaneous vascular issue; this certainly is not a normal pressure ulcer. We will continue to use silver alginate to the open portion of the wound, but we are going to make referral to dermatology for further evaluation as this may be something that would respond better to laser therapy or other intervention. She will follow-up here in 3 weeks. Electronic Signature(s) Signed: 02/22/2022 2:15:01 PM By: Fredirick Maudlin MD FACS Entered By: Fredirick Maudlin on 02/22/2022 14:15:01 -------------------------------------------------------------------------------- HxROS Details Patient Name: Date of Service: Katrina Amel D. 02/22/2022 1:15 PM Medical Record Number: [id number] Patient Account Number: [id number] Date of Birth/Sex: Treating RN: [birth year] (88 y.o. [name] Primary Care Provider: [name] Other Clinician: Referring Provider: Treating Provider/Extender: Kathlyn [place] in Treatment: 8 Information Obtained From Patient Eyes Medical History: Negative for: Cataracts; Glaucoma; Optic Neuritis Ear/Nose/Mouth/Throat Medical History: Negative for: Chronic sinus problems/congestion; Middle ear problems Hematologic/Lymphatic Medical History: Negative for: Anemia; Hemophilia; Human Immunodeficiency Virus; Lymphedema; Sickle Cell Disease Respiratory Medical History: Positive for: Sleep Apnea - does not use Cpap or 02 as ordered Negative for: Aspiration; Asthma; Chronic Obstructive  Pulmonary Disease (COPD); Pneumothorax; Tuberculosis Cardiovascular Medical History: Positive for: Arrhythmia - A-Fib; Hypertension; Phlebitis Negative for: Angina; Congestive Heart Failure; Coronary Artery Disease; Deep Vein Thrombosis; Hypotension; Myocardial Infarction; Peripheral Arterial Disease; Peripheral Venous Disease; Vasculitis Past Medical History Notes: Hyperlipidemia Gastrointestinal Medical History: Negative for: Cirrhosis ; Colitis; Crohns; Hepatitis A; Hepatitis B; Hepatitis C Endocrine Medical History: Negative for: Type I Diabetes; Type II Diabetes Past Medical History Notes: Hypothyroidism Genitourinary Medical History: Negative for: End Stage Renal Disease Immunological Medical History: Negative for: Lupus Erythematosus; Raynauds; Scleroderma Integumentary (Skin) Medical History: Negative for: History of Burn Musculoskeletal Medical History: Negative for: Gout; Rheumatoid Arthritis; Osteoarthritis; Osteomyelitis Neurologic Medical History: Negative for: Dementia; Neuropathy; Quadriplegia; Paraplegia; Seizure Disorder Oncologic Medical History: Negative for: Received Chemotherapy; Received Radiation Psychiatric Medical History: Negative for: Anorexia/bulimia; Confinement Anxiety Immunizations Pneumococcal Vaccine: Received Pneumococcal Vaccination: Yes Received Pneumococcal Vaccination On or After 60th Birthday: No Implantable Devices None Family and Social History Cancer: Yes - Siblings; Heart Disease: Yes - Mother,Father; Hereditary Spherocytosis: No; Hypertension: Yes - Father; Kidney Disease: No; Lung Disease: No; Seizures: No; Stroke: No; Thyroid Problems: No; Tuberculosis: No; Never smoker; Marital Status - Single; Alcohol Use: Never; Drug Use: No History; Caffeine Use: Rarely; Financial Concerns: No; Food, Clothing or Shelter Needs: No; Support System Lacking: No; Transportation Concerns: No Engineer, maintenance) Signed: 02/22/2022 2:54:17 PM  By: Fredirick Maudlin MD FACS Signed: 02/22/2022 5:55:38 PM By: Baruch Gouty RN, BSN Entered By: Fredirick Maudlin on 02/22/2022 14:10:16 -------------------------------------------------------------------------------- SuperBill Details Patient Name: Date of Service: Katrina Amel D. 02/22/2022 Medical Record Number: [id number] Patient Account Number: [id number] Date of Birth/Sex: Treating RN: 1932-01-29 (88 y.o. [name] Primary Care Provider: [name] Other Clinician: Referring Provider: Treating Provider/Extender: Kathlyn [place] in Treatment: 8 Diagnosis Coding ICD-10 Codes Code Description [phone] Pressure ulcer of left buttock, stage 2 I87.2 Venous insufficiency (chronic) (peripheral) I10 Essential (primary) hypertension Facility Procedures CPT4 Code: [id number] Description: 99213 - WOUND CARE VISIT-LEV 3 EST PT Modifier: Quantity: 1 Physician Procedures : QPY1  Code Description Modifier [id number] - WC PHYS LEVEL 4 - EST PT ICD-10 Diagnosis Description L89.322 Pressure ulcer of left buttock, stage 2 I87.2 Venous insufficiency (chronic) (peripheral) I10 Essential (primary) hypertension Quantity: 1 Electronic Signature(s) Signed: 02/22/2022 2:15:13 PM By: Fredirick Maudlin MD FACS Entered By: Fredirick Maudlin on 02/22/2022 14:15:13

## 2022-02-22 NOTE — Progress Notes (Signed)
Katrina [name], Katrina [name] ([id number]) Visit Report for 02/22/2022 Arrival Information Details Patient Name: Date of Service: Katrina [name], Katrina [name] 02/22/2022 1:15 PM Medical Record Number: [id number] Patient Account Number: [id number] Date of Birth/Sex: Treating RN: [birth year] (88 y.o. [name], [name] Primary Care Almendra Loria: [name] Other Clinician: Referring Maurice Fotheringham: Treating Estee Yohe/Extender: Kathlyn [place] in Treatment: 8 Visit Information History Since Last Visit Added or deleted any medications: No Patient Arrived: [name] Any new allergies or adverse reactions: No Arrival Time: 13:37 Had a fall or experienced change in No Accompanied By: self activities of daily living that may affect Transfer Assistance: None risk of falls: Patient Identification Verified: Yes Signs or symptoms of abuse/neglect since last visito No Secondary Verification Process Completed: Yes Hospitalized since last visit: No Patient Requires Transmission-Based Precautions: No Implantable device outside of the clinic excluding No Patient Has Alerts: No cellular tissue based products placed in the center since last visit: Has Dressing in Place as Prescribed: Yes Pain Present Now: No Electronic Signature(s) Signed: 02/22/2022 5:55:38 PM By: Baruch Gouty RN, BSN Entered By: Baruch Gouty on 02/22/2022 13:46:59 -------------------------------------------------------------------------------- Clinic Level of Care Assessment Details Patient Name: Date of Service: Katrina Amel D. 02/22/2022 1:15 PM Medical Record Number: [id number] Patient Account Number: [id number] Date of Birth/Sex: Treating RN: 1932/08/21 (88 y.o. [name] Primary Care Chesnie Capell: [name] Other Clinician: Referring Eliz Nigg: Treating Franko Hilliker/Extender: Kathlyn [place] in Treatment: 8 Clinic Level of Care Assessment Items TOOL 4 Quantity Score _0  - 0 Use when only an EandM is  performed on FOLLOW-UP visit ASSESSMENTS - Nursing Assessment / Reassessment X- 1 10 Reassessment of Co-morbidities (includes updates in patient status) X- 1 5 Reassessment of Adherence to Treatment Plan ASSESSMENTS - Wound and Skin A ssessment / Reassessment X - Simple Wound Assessment / Reassessment - one wound 1 5 _1  - 0 Complex Wound Assessment / Reassessment - multiple wounds _2  - 0 Dermatologic / Skin Assessment (not related to wound area) ASSESSMENTS - Focused Assessment _3  - 0 Circumferential Edema Measurements - multi extremities _4  - 0 Nutritional Assessment / Counseling / Intervention _5  - 0 Lower Extremity Assessment (monofilament, tuning fork, pulses) _6  - 0 Peripheral Arterial Disease Assessment (using hand held doppler) ASSESSMENTS - Ostomy and/or Continence Assessment and Care _7  - 0 Incontinence Assessment and Management _8  - 0 Ostomy Care Assessment and Management (repouching, etc.) PROCESS - Coordination of Care X - Simple Patient / Family Education for ongoing care 1 15 _9  - 0 Complex (extensive) Patient / Family Education for ongoing care X- 1 10 Staff obtains Programmer, systems, Records, T Results / Process Orders est _10  - 0 Staff telephones HHA, Nursing Homes / Clarify orders / etc _11  - 0 Routine Transfer to another Facility (non-emergent condition) _12  - 0 Routine Hospital Admission (non-emergent condition) _13  - 0 New Admissions / Biomedical engineer / Ordering NPWT Apligraf, etc. , _14  - 0 Emergency Hospital Admission (emergent condition) X- 1 10 Simple Discharge Coordination _15  - 0 Complex (extensive) Discharge Coordination PROCESS - Special Needs _16  - 0 Pediatric / Minor Patient Management _17  - 0 Isolation Patient Management _18  - 0 Hearing / Language / Visual special needs _19  - 0 Assessment of Community assistance (transportation, D/C planning, etc.) _20  - 0 Additional assistance / Altered mentation _21  - 0 Support Surface(s) Assessment  (bed, cushion, seat, etc.) INTERVENTIONS - Wound Cleansing / Measurement X - Simple Wound Cleansing - one wound 1 5 _22  - 0 Complex Wound Cleansing - multiple wounds  X- 1 5 Wound Imaging (photographs - any number of wounds) _0  - 0 Wound Tracing (instead of photographs) X- 1 5 Simple Wound Measurement - one wound _1  - 0 Complex Wound Measurement - multiple wounds INTERVENTIONS - Wound Dressings X - Small Wound Dressing one or multiple wounds 1 10 _2  - 0 Medium Wound Dressing one or multiple wounds _3  - 0 Large Wound Dressing one or multiple wounds X- 1 5 Application of Medications - topical <[id number]>_4<\/[id number]>_0  - 0 Application of Medications - injection INTERVENTIONS - Miscellaneous _5  - 0 External ear exam _6  - 0 Specimen Collection (cultures, biopsies, blood, body fluids, etc.) _7  - 0 Specimen(s) / Culture(s) sent or taken to Lab for analysis _8  - 0 Patient Transfer (multiple staff / Civil Service fast streamer / Similar devices) _9  - 0 Simple Staple / Suture removal (25 or less) _10  - 0 Complex Staple / Suture removal (26 or more) _11  - 0 Hypo / Hyperglycemic Management (close monitor of Blood Glucose) _12  - 0 Ankle / Brachial Index (ABI) - do not check if billed separately X- 1 5 Vital Signs Has the patient been seen at the hospital within the last three years: Yes Total Score: 90 Level Of Care: New/Established - Level 3 Electronic Signature(s) Signed: 02/22/2022 5:55:38 PM By: Baruch Gouty RN, BSN Entered By: Baruch Gouty on 02/22/2022 14:09:51 -------------------------------------------------------------------------------- Encounter Discharge Information Details Patient Name: Date of Service: Katrina Amel D. 02/22/2022 1:15 PM Medical Record Number: [id number] Patient Account Number: [id number] Date of Birth/Sex: Treating RN: [birth year] (88 y.o. [name] Primary Care Khalin Royce: [name] Other Clinician: Referring Rashana Andrew: Treating Tarence Searcy/Extender: Kathlyn [place] in Treatment: 8 Encounter Discharge Information Items Discharge Condition: Stable Ambulatory Status: Cane Discharge Destination: Other (Note Required) Telephoned: No Orders Sent: Yes Transportation: Other Accompanied By: self Schedule Follow-up Appointment: Yes Clinical Summary of Care: Patient Declined Notes ALF, facility transportation Electronic Signature(s) Signed: 02/22/2022 5:55:38 PM By: Baruch Gouty RN, BSN Entered By: Baruch Gouty on 02/22/2022 14:16:05 -------------------------------------------------------------------------------- Lower Extremity Assessment Details Patient Name: Date of Service: Katrina Amel D. 02/22/2022 1:15 PM Medical Record Number: [id number] Patient Account Number: [id number] Date of Birth/Sex: Treating RN: 01-05-1932 (87 y.o. [name] Primary Care Wendelin Bradt: [name] Other Clinician: Referring Maryn Freelove: Treating Nakai Yard/Extender: Kathlyn [place] in Treatment: 8 Electronic Signature(s) Signed: 02/22/2022 5:55:38 PM By: Baruch Gouty RN, BSN Entered By: Baruch Gouty on 02/22/2022 13:48:17 -------------------------------------------------------------------------------- Multi Wound Chart Details Patient Name: Date of Service: Katrina Amel D. 02/22/2022 1:15 PM Medical Record Number: [id number] Patient Account Number: [id number] Date of Birth/Sex: Treating RN: [birth year] (88 y.o. [name] Primary Care Nolita Kutter: [name] Other Clinician: Referring Reis Goga: Treating Darriel Sinquefield/Extender: Kathlyn [place] in Treatment: 8 Vital Signs Height(in): Pulse(bpm): 49 Weight(lbs): Blood Pressure(mmHg): 150/80 Body Mass Index(BMI): Temperature(F): 98.2 Respiratory Rate(breaths/min): 18 Photos: [N/A:N/A] Left Gluteus N/A N/A Wound Location: Pressure Injury N/A N/A Wounding Event: Pressure Ulcer N/A N/A Primary Etiology: Sleep Apnea, Arrhythmia, N/A  N/A Comorbid History: Hypertension, Phlebitis 08/23/2021 N/A N/A Date Acquired: 8 N/A N/A Weeks of Treatment: Open N/A N/A Wound Status: No N/A N/A Wound Recurrence: 0.3x0.8x0.2 N/A N/A Measurements L x W x D (cm) 0.188 N/A N/A A (cm) : rea 0.038 N/A N/A Volume (cm) : 85.00% N/A N/A % Reduction in A rea: 84.90% N/A N/A % Reduction in Volume: Category/Stage III N/A N/A Classification: Small N/A N/A Exudate A mount: Serosanguineous N/A N/A Exudate Type: red, brown N/A N/A Exudate Color: Epibole  N/A N/A Wound Margin: Large (67-100%) N/A N/A Granulation A mount: Red, Pale N/A N/A Granulation Quality: None Present (0%) N/A N/A Necrotic A mount: Fat Layer (Subcutaneous Tissue): Yes N/A N/A Exposed Structures: Fascia: No Tendon: No Muscle: No Joint: No Bone: No Small (1-33%) N/A N/A Epithelialization: Treatment Notes Electronic Signature(s) Signed: 02/22/2022 2:09:12 PM By: Fredirick Maudlin MD FACS Signed: 02/22/2022 5:55:38 PM By: Baruch Gouty RN, BSN Entered By: Fredirick Maudlin on 02/22/2022 14:09:12 -------------------------------------------------------------------------------- Multi-Disciplinary Care Plan Details Patient Name: Date of Service: Katrina Amel D. 02/22/2022 1:15 PM Medical Record Number: [id number] Patient Account Number: [id number] Date of Birth/Sex: Treating RN: 1932-01-30 (88 y.o. [name] Primary Care Keona Bilyeu: [name] Other Clinician: Referring Criss Pallone: Treating Shavonda Wiedman/Extender: Kathlyn [place] in Treatment: 8 Multidisciplinary Care Plan reviewed with physician Active Inactive Abuse / Safety / Falls / Self Care Management Nursing Diagnoses: Potential for falls Goals: Patient/caregiver will verbalize/demonstrate measures taken to prevent injury and/or falls Date Initiated: 12/28/2021 Target Resolution Date: 03/22/2022 Goal Status: Active Interventions: Assess fall risk on admission and  as needed Assess impairment of mobility on admission and as needed per policy Notes: Nutrition Nursing Diagnoses: Imbalanced nutrition Potential for alteratiion in Nutrition/Potential for imbalanced nutrition Goals: Patient/caregiver agrees to and verbalizes understanding of need to use nutritional supplements and/or vitamins as prescribed Date Initiated: 12/28/2021 Target Resolution Date: 03/22/2022 Goal Status: Active Interventions: Assess patient nutrition upon admission and as needed per policy Provide education on nutrition Treatment Activities: Patient referred to Primary Care Physician for further nutritional evaluation : 12/28/2021 Notes: Pressure Nursing Diagnoses: Knowledge deficit related to causes and risk factors for pressure ulcer development Knowledge deficit related to management of pressures ulcers Potential for impaired tissue integrity related to pressure, friction, moisture, and shear Goals: Patient/caregiver will verbalize understanding of pressure ulcer management Date Initiated: 12/28/2021 Target Resolution Date: 03/22/2022 Goal Status: Active Interventions: Assess: immobility, friction, shearing, incontinence upon admission and as needed Assess potential for pressure ulcer upon admission and as needed Notes: Wound/Skin Impairment Nursing Diagnoses: Impaired tissue integrity Knowledge deficit related to ulceration/compromised skin integrity Goals: Patient/caregiver will verbalize understanding of skin care regimen Date Initiated: 12/28/2021 Target Resolution Date: 03/22/2022 Goal Status: Active Ulcer/skin breakdown will have a volume reduction of 30% by week 4 Date Initiated: 12/28/2021 Date Inactivated: 01/25/2022 Target Resolution Date: 01/25/2022 Goal Status: Met Ulcer/skin breakdown will have a volume reduction of 50% by week 8 Date Initiated: 01/25/2022 Date Inactivated: 02/22/2022 Target Resolution Date: 02/22/2022 Unmet Reason: atypical wound, derm Goal  Status: Unmet consult Interventions: Assess patient/caregiver ability to obtain necessary supplies Assess patient/caregiver ability to perform ulcer/skin care regimen upon admission and as needed Assess ulceration(s) every visit Provide education on ulcer and skin care Treatment Activities: Skin care regimen initiated : 12/28/2021 Topical wound management initiated : 12/28/2021 Notes: Electronic Signature(s) Signed: 02/22/2022 5:55:38 PM By: Baruch Gouty RN, BSN Entered By: Baruch Gouty on 02/22/2022 13:58:55 -------------------------------------------------------------------------------- Pain Assessment Details Patient Name: Date of Service: Katrina Amel D. 02/22/2022 1:15 PM Medical Record Number: [id number] Patient Account Number: [id number] Date of Birth/Sex: Treating RN: 08-25-1931 (88 y.o. [name] Primary Care Levii Hairfield: [name] Other Clinician: Referring Jamonte Curfman: Treating Latroy Gaymon/Extender: Kathlyn [place] in Treatment: 8 Active Problems Location of Pain Severity and Description of Pain Patient Has Paino No Site Locations Rate the pain. Current Pain Level: 0 Worst Pain Level: 4 Least Pain Level: 0 Character of Pain Describe the Pain: Other: sore Pain Management and Medication Current Pain Management: Other:  reposition Is the Current Pain Management Adequate: Adequate How does your wound impact your activities of daily livingo Sleep: No Bathing: No Appetite: No Relationship With Others: No Bladder Continence: No Emotions: No Bowel Continence: No Work: No Toileting: No Drive: No Dressing: No Hobbies: No Electronic Signature(s) Signed: 02/22/2022 5:55:38 PM By: Baruch Gouty RN, BSN Entered By: Baruch Gouty on 02/22/2022 13:48:10 -------------------------------------------------------------------------------- Patient/Caregiver Education Details Patient Name: Date of Service: Katrina Amel D. 7/3/2023andnbsp1:15  PM Medical Record Number: [id number] Patient Account Number: [id number] Date of Birth/Gender: Treating RN: 05/26/1932 (88 y.o. [name] Primary Care Physician: [name] Other Clinician: Referring Physician: Treating Physician/Extender: Kathlyn [place] in Treatment: 8 Education Assessment Education Provided To: Patient Education Topics Provided Pressure: Methods: Explain/Verbal Responses: Reinforcements needed, State content correctly Wound/Skin Impairment: Methods: Explain/Verbal Responses: Reinforcements needed, State content correctly Electronic Signature(s) Signed: 02/22/2022 5:55:38 PM By: Baruch Gouty RN, BSN Entered By: Baruch Gouty on 02/22/2022 13:59:14 -------------------------------------------------------------------------------- Wound Assessment Details Patient Name: Date of Service: Katrina Amel D. 02/22/2022 1:15 PM Medical Record Number: [id number] Patient Account Number: [id number] Date of Birth/Sex: Treating RN: [birth year] (88 y.o. [name] Primary Care Habeeb Puertas: [name] Other Clinician: Referring Scout Gumbs: Treating Devaun Hernandez/Extender: Kathlyn [place] in Treatment: 8 Wound Status Wound Number: 14 Primary Etiology: Pressure Ulcer Wound Location: Left Gluteus Wound Status: Open Wounding Event: Pressure Injury Comorbid History: Sleep Apnea, Arrhythmia, Hypertension, Phlebitis Date Acquired: 08/23/2021 Weeks Of Treatment: 8 Clustered Wound: No Photos Wound Measurements Length: (cm) 0.3 Width: (cm) 0.8 Depth: (cm) 0.2 Area: (cm) 0.188 Volume: (cm) 0.038 % Reduction in Area: 85% % Reduction in Volume: 84.9% Epithelialization: Small (1-33%) Tunneling: No Undermining: No Wound Description Classification: Category/Stage III Wound Margin: Epibole Exudate Amount: Small Exudate Type: Serosanguineous Exudate Color: red, brown Foul Odor After Cleansing:  No Slough/Fibrino No Wound Bed Granulation Amount: Large (67-100%) Exposed Structure Granulation Quality: Red, Pale Fascia Exposed: No Necrotic Amount: None Present (0%) Fat Layer (Subcutaneous Tissue) Exposed: Yes Tendon Exposed: No Muscle Exposed: No Joint Exposed: No Bone Exposed: No Treatment Notes Wound #14 (Gluteus) Wound Laterality: Left Cleanser Soap and Water Discharge Instruction: May shower and wash wound with dial antibacterial soap and water prior to dressing change. Peri-Wound Care Topical Primary Dressing KerraCel Ag Gelling Fiber Dressing, 2x2 in (silver alginate) Discharge Instruction: Apply silver alginate to wound bed as instructed Secondary Dressing ALLEVYN Gentle Border, 4x4 (in/in) Discharge Instruction: Apply over primary dressing as directed. Secured With Compression Wrap Compression Stockings Environmental education officer) Signed: 02/22/2022 5:55:38 PM By: Baruch Gouty RN, BSN Entered By: Baruch Gouty on 02/22/2022 13:56:52 -------------------------------------------------------------------------------- Vitals Details Patient Name: Date of Service: Katrina Amel D. 02/22/2022 1:15 PM Medical Record Number: [id number] Patient Account Number: [id number] Date of Birth/Sex: Treating RN: 1931-10-30 (88 y.o. [name] Primary Care Makita Blow: [name] Other Clinician: Referring Obdulio Mash: Treating Dwaine Pringle/Extender: Kathlyn [place] in Treatment: 8 Vital Signs Time Taken: 13:47 Temperature (F): 98.2 Pulse (bpm): 68 Respiratory Rate (breaths/min): 18 Blood Pressure (mmHg): 150/80 Reference Range: 80 - 120 mg / dl Electronic Signature(s) Signed: 02/22/2022 5:55:38 PM By: Baruch Gouty RN, BSN Entered By: Baruch Gouty on 02/22/2022 13:47:25

## 2022-03-15 ENCOUNTER — Encounter (HOSPITAL_BASED_OUTPATIENT_CLINIC_OR_DEPARTMENT_OTHER): Payer: Medicare Other | Admitting: General Surgery

## 2022-03-15 DIAGNOSIS — L89322 Pressure ulcer of left buttock, stage 2: Secondary | ICD-10-CM | POA: Diagnosis not present

## 2022-03-15 NOTE — Progress Notes (Signed)
Katrina [name], Katrina [name] ([id number]) Visit Report for 03/15/2022 Arrival Information Details Patient [name]: Date of Service: Katrina [name] 03/15/2022 1:15 PM Medical Record Number: [id number] Patient Account Number: [id number] Date of Birth/Sex: Treating RN: 09-Jun-1932 (88 y.o. Katrina [name], Katrina [name] Primary Care Geraldine Sandberg: [name] Other Clinician: Referring Jaece Ducharme: Treating Raelyn Racette/Extender: Kathlyn [place] in Treatment: 11 Visit Information History Since Last Visit All ordered tests and consults were completed: Yes Patient Arrived: Katrina [name] Added or deleted any medications: No Arrival Time: 13:42 Any new allergies or adverse reactions: No Accompanied By: self Had a fall or experienced change in No Transfer Assistance: None activities of daily living that may affect Patient Identification Verified: Yes risk of falls: Secondary Verification Process Completed: Yes Signs or symptoms of abuse/neglect since last visito No Patient Requires Transmission-Based Precautions: No Hospitalized since last visit: No Patient Has Alerts: No Implantable device outside of the clinic excluding No cellular tissue based products placed in the center since last visit: Pain Present Now: No Electronic Signature(s) Signed: 03/15/2022 5:10:34 PM By: Blanche East RN Entered By: Blanche East on 03/15/2022 13:49:17 -------------------------------------------------------------------------------- Encounter Discharge Information Details Patient [name]: Date of Service: Katrina Amel D. 03/15/2022 1:15 PM Medical Record Number: [id number] Patient Account Number: [id number] Date of Birth/Sex: Treating RN: [birth year] (88 y.o. Katrina [name] Primary Care Katrina [name]: [name] Other Clinician: Referring Xayne Brumbaugh: Treating Kristee Angus/Extender: Kathlyn [place] in Treatment: 11 Encounter Discharge Information Items Discharge Condition: Stable Ambulatory Status:  Cane Discharge Destination: Other (Note Required) Telephoned: No Orders Sent: Yes Transportation: Other Accompanied By: self Schedule Follow-up Appointment: Yes Clinical Summary of Care: Patient Declined Notes ALF, facility transportation Electronic Signature(s) Signed: 03/15/2022 5:10:34 PM By: Blanche East RN Entered By: Blanche East on 03/15/2022 14:21:48 -------------------------------------------------------------------------------- Lower Extremity Assessment Details Patient [name]: Date of Service: Katrina Amel D. 03/15/2022 1:15 PM Medical Record Number: [id number] Patient Account Number: [id number] Date of Birth/Sex: Treating RN: 1932-05-01 (88 y.o. Katrina [name] Primary Care Katrina [name]: [name] Other Clinician: Referring Kanae Ignatowski: Treating Gala Padovano/Extender: Kathlyn [place] in Treatment: 11 Electronic Signature(s) Signed: 03/15/2022 5:10:34 PM By: Blanche East RN Signed: 03/15/2022 5:44:10 PM By: Baruch Gouty RN, BSN Entered By: Blanche East on 03/15/2022 13:50:28 -------------------------------------------------------------------------------- Multi Wound Chart Details Patient [name]: Date of Service: Katrina Amel D. 03/15/2022 1:15 PM Medical Record Number: [id number] Patient Account Number: [id number] Date of Birth/Sex: Treating RN: 12/13/1931 (88 y.o. Katrina [name] Primary Care Katrina [name]: [name] Other Clinician: Referring Regina Ganci: Treating Zykeriah Mathia/Extender: Kathlyn [place] in Treatment: 11 Vital Signs Height(in): Pulse(bpm): 62 Weight(lbs): Blood Pressure(mmHg): 188/81 Body Mass Index(BMI): Temperature(F): 98.5 Respiratory Rate(breaths/min): 18 Photos: [N/A:N/A] Left Gluteus N/A N/A Wound Location: Pressure Injury N/A N/A Wounding Event: Pressure Ulcer N/A N/A Primary Etiology: Sleep Apnea, Arrhythmia, N/A N/A Comorbid History: Hypertension, Phlebitis 08/23/2021 N/A  N/A Date Acquired: 11 N/A N/A Weeks of Treatment: Open N/A N/A Wound Status: No N/A N/A Wound Recurrence: 0.3x0.8x0.5 N/A N/A Measurements L x W x D (cm) 0.188 N/A N/A A (cm) : rea 0.094 N/A N/A Volume (cm) : 85.00% N/A N/A % Reduction in A rea: 62.50% N/A N/A % Reduction in Volume: Category/Stage III N/A N/A Classification: Medium N/A N/A Exudate A mount: Sanguinous N/A N/A Exudate Type: red N/A N/A Exudate Color: Epibole N/A N/A Wound Margin: Large (67-100%) N/A N/A Granulation A mount: Red, Friable N/A N/A Granulation Quality: None Present (0%) N/A N/A Necrotic Amount: Fat Layer (Subcutaneous Tissue): Yes N/A N/A Exposed Structures: Fascia:  No Tendon: No Muscle: No Joint: No Bone: No None N/A N/A Epithelialization: Chemical Cauterization N/A N/A Procedures Performed: Treatment Notes Wound #14 (Gluteus) Wound Laterality: Left Cleanser Soap and Water Discharge Instruction: May shower and wash wound with dial antibacterial soap and water prior to dressing change. Peri-Wound Care Topical Primary Dressing KerraCel Ag Gelling Fiber Dressing, 2x2 in (silver alginate) Discharge Instruction: Apply silver alginate to wound bed as instructed Secondary Dressing ALLEVYN Gentle Border, 4x4 (in/in) Discharge Instruction: Apply over primary dressing as directed. Secured With Compression Wrap Compression Stockings Environmental education officer) Signed: 03/15/2022 2:32:02 PM By: Fredirick Maudlin MD FACS Signed: 03/15/2022 5:44:10 PM By: Baruch Gouty RN, BSN Entered By: Fredirick Maudlin on 03/15/2022 14:32:02 -------------------------------------------------------------------------------- Multi-Disciplinary Care Plan Details Patient [name]: Date of Service: Katrina Amel D. 03/15/2022 1:15 PM Medical Record Number: [id number] Patient Account Number: [id number] Date of Birth/Sex: Treating RN: [birth year] (88 y.o. Katrina [name] Primary Care Sarath Privott:  [name] Other Clinician: Referring Wynton Hufstetler: Treating Akshath Mccarey/Extender: Kathlyn [place] in Treatment: [place] reviewed with physician Active Inactive Abuse / Safety / Falls / Self Care Management Nursing Diagnoses: Potential for falls Goals: Patient/caregiver will verbalize/demonstrate measures taken to prevent injury and/or falls Date Initiated: 12/28/2021 Target Resolution Date: 03/22/2022 Goal Status: Active Interventions: Assess fall risk on admission and as needed Assess impairment of mobility on admission and as needed per policy Notes: Nutrition Nursing Diagnoses: Imbalanced nutrition Potential for alteratiion in Nutrition/Potential for imbalanced nutrition Goals: Patient/caregiver agrees to and verbalizes understanding of need to use nutritional supplements and/or vitamins as prescribed Date Initiated: 12/28/2021 Target Resolution Date: 03/22/2022 Goal Status: Active Interventions: Assess patient nutrition upon admission and as needed per policy Provide education on nutrition Treatment Activities: Patient referred to Primary Care Physician for further nutritional evaluation : 12/28/2021 Notes: Pressure Nursing Diagnoses: Knowledge deficit related to causes and risk factors for pressure ulcer development Knowledge deficit related to management of pressures ulcers Potential for impaired tissue integrity related to pressure, friction, moisture, and shear Goals: Patient/caregiver will verbalize understanding of pressure ulcer management Date Initiated: 12/28/2021 Target Resolution Date: 03/22/2022 Goal Status: Active Interventions: Assess: immobility, friction, shearing, incontinence upon admission and as needed Assess potential for pressure ulcer upon admission and as needed Notes: Wound/Skin Impairment Nursing Diagnoses: Impaired tissue integrity Knowledge deficit related to ulceration/compromised skin  integrity Goals: Patient/caregiver will verbalize understanding of skin care regimen Date Initiated: 12/28/2021 Target Resolution Date: 03/22/2022 Goal Status: Active Ulcer/skin breakdown will have a volume reduction of 30% by week 4 Date Initiated: 12/28/2021 Date Inactivated: 01/25/2022 Target Resolution Date: 01/25/2022 Goal Status: Met Ulcer/skin breakdown will have a volume reduction of 50% by week 8 Date Initiated: 01/25/2022 Date Inactivated: 02/22/2022 Target Resolution Date: 02/22/2022 Unmet Reason: atypical wound, derm Goal Status: Unmet consult Interventions: Assess patient/caregiver ability to obtain necessary supplies Assess patient/caregiver ability to perform ulcer/skin care regimen upon admission and as needed Assess ulceration(s) every visit Provide education on ulcer and skin care Treatment Activities: Skin care regimen initiated : 12/28/2021 Topical wound management initiated : 12/28/2021 Notes: Electronic Signature(s) Signed: 03/15/2022 5:10:34 PM By: Blanche East RN Signed: 03/15/2022 5:44:10 PM By: Baruch Gouty RN, BSN Entered By: Blanche East on 03/15/2022 13:59:16 -------------------------------------------------------------------------------- Pain Assessment Details Patient [name]: Date of Service: Katrina Amel D. 03/15/2022 1:15 PM Medical Record Number: [id number] Patient Account Number: [id number] Date of Birth/Sex: Treating RN: 1932/06/07 (88 y.o. Katrina [name] Primary Care Rhylen Shaheen: [name] Other Clinician: Referring Jaedon Siler: Treating Natalye Kott/Extender: Lasandra Beech  Weeks in Treatment: 11 Active Problems Location of Pain Severity and Description of Pain Patient Has Paino No Site Locations With Dressing Change: Yes Rate the pain. Current Pain Level: 0 Pain Management and Medication Current Pain Management: Electronic Signature(s) Signed: 03/15/2022 5:10:34 PM By: Blanche East RN Signed: 03/15/2022 5:44:10 PM By:  Baruch Gouty RN, BSN Entered By: Blanche East on 03/15/2022 13:50:21 -------------------------------------------------------------------------------- Patient/Caregiver Education Details Patient [name]: Date of Service: [place] 7/24/2023andnbsp1:15 PM Medical Record Number: [id number] Patient Account Number: [id number] Date of Birth/Gender: Treating RN: 15-May-1932 (88 y.o. Katrina [name] Primary Care Physician: [name] Other Clinician: Referring Physician: Treating Physician/Extender: Kathlyn [place] in Treatment: 11 Education Assessment Education Provided To: Patient Education Topics Provided Pressure: Methods: Explain/Verbal Responses: Reinforcements needed, State content correctly Wound/Skin Impairment: Methods: Explain/Verbal Responses: Reinforcements needed, State content correctly Electronic Signature(s) Signed: 03/15/2022 5:10:34 PM By: Blanche East RN Entered By: Blanche East on 03/15/2022 13:59:38 -------------------------------------------------------------------------------- Wound Assessment Details Patient [name]: Date of Service: Katrina Amel D. 03/15/2022 1:15 PM Medical Record Number: [id number] Patient Account Number: [id number] Date of Birth/Sex: Treating RN: 01-17-1932 (88 y.o. Katrina [name], Katrina [name] Primary Care Korie Streat: [name] Other Clinician: Referring Azile Minardi: Treating Axil Copeman/Extender: Kathlyn [place] in Treatment: 11 Wound Status Wound Number: 14 Primary Etiology: Pressure Ulcer Wound Location: Left Gluteus Wound Status: Open Wounding Event: Pressure Injury Comorbid History: Sleep Apnea, Arrhythmia, Hypertension, Phlebitis Date Acquired: 08/23/2021 Weeks Of Treatment: 11 Clustered Wound: No Photos Wound Measurements Length: (cm) 0.3 Width: (cm) 0.8 Depth: (cm) 0.5 Area: (cm) 0.188 Volume: (cm) 0.094 % Reduction in Area: 85% % Reduction in Volume:  62.5% Epithelialization: None Tunneling: No Undermining: No Wound Description Classification: Category/Stage III Wound Margin: Epibole Exudate Amount: Medium Exudate Type: Sanguinous Exudate Color: red Foul Odor After Cleansing: No Slough/Fibrino No Wound Bed Granulation Amount: Large (67-100%) Exposed Structure Granulation Quality: Red, Friable Fascia Exposed: No Necrotic Amount: None Present (0%) Fat Layer (Subcutaneous Tissue) Exposed: Yes Tendon Exposed: No Muscle Exposed: No Joint Exposed: No Bone Exposed: No Treatment Notes Wound #14 (Gluteus) Wound Laterality: Left Cleanser Soap and Water Discharge Instruction: May shower and wash wound with dial antibacterial soap and water prior to dressing change. Peri-Wound Care Topical Primary Dressing KerraCel Ag Gelling Fiber Dressing, 2x2 in (silver alginate) Discharge Instruction: Apply silver alginate to wound bed as instructed Secondary Dressing ALLEVYN Gentle Border, 4x4 (in/in) Discharge Instruction: Apply over primary dressing as directed. Secured With Compression Wrap Compression Stockings Environmental education officer) Signed: 03/15/2022 5:44:10 PM By: Baruch Gouty RN, BSN Entered By: Baruch Gouty on 03/15/2022 13:58:46 -------------------------------------------------------------------------------- [place] Details Patient [name]: Date of Service: Katrina Amel D. 03/15/2022 1:15 PM Medical Record Number: [id number] Patient Account Number: [id number] Date of Birth/Sex: Treating RN: 09-Dec-1931 (88 y.o. Katrina [name] Primary Care Virl Coble: [name] Other Clinician: Referring Sawyer Kahan: Treating Jacoba Cherney/Extender: Kathlyn [place] in Treatment: 11 Vital Signs Time Taken: 13:45 Temperature (F): 98.5 Pulse (bpm): 70 Respiratory Rate (breaths/min): 18 Blood Pressure (mmHg): 188/81 Reference Range: 80 - 120 mg / dl Electronic Signature(s) Signed: 03/15/2022 5:10:34 PM  By: Blanche East RN Entered By: Blanche East on 03/15/2022 13:50:06

## 2022-03-15 NOTE — Progress Notes (Signed)
Katrina [name], Katrina [name] ([id number]) Visit Report for 03/15/2022 Chief Complaint Document Details Patient Name: Date of Service: Katrina [name], Katrina [name] 03/15/2022 1:15 PM Medical Record Number: [id number] Patient Account Number: [id number] Date of Birth/Sex: Treating RN: Mar 13, 1932 (88 y.o. Katrina [name] Primary Care Provider: [name] Other Clinician: Referring Provider: Treating Provider/Extender: Kathlyn Sacramento in Treatment: 11 Information Obtained from: Patient Chief Complaint Patient returns to the wound care center for reopened ulcer to: Left buttock Electronic Signature(s) Signed: 03/15/2022 2:32:08 PM By: Fredirick Maudlin MD FACS Entered By: Fredirick Maudlin on 03/15/2022 14:32:08 -------------------------------------------------------------------------------- HPI Details Patient Name: Date of Service: Katrina Amel D. 03/15/2022 1:15 PM Medical Record Number: [id number] Patient Account Number: [id number] Date of Birth/Sex: Treating RN: 02-Sep-1931 (88 y.o. Katrina [name] Primary Care Provider: [name] Other Clinician: Referring Provider: Treating Provider/Extender: Kathlyn Sacramento in Treatment: 11 History of Present Illness Location: right lower extremity Quality: Patient reports experiencing a dull pain to affected area(s). Severity: Patient states wound(s) are getting better. Duration: Patient has had the wound for 3 months prior to presenting for treatment Context: The wound would happen gradually Modifying Factors: Patient wound(s)/ulcer(s) are worsening due to :standing for a long while. HPI Description: Past medical history of pulmonary emboli, hypothyroidism, hyperlipidemia, hypertension, sleep apnea, gallstones, ulcerated lower extremities, atrial fibrillation, status post total knee replacement, cholecystectomy, cystoscopy and stent placement for kidney stones. She has never been a smoker. About a year ago she was  seen by [name] with an ulcer of her right lower extremity which she's had for about over 3 months. On 10/23/14 -- Lower extremity venous duplex evaluation. She had no evidence of deep venous thromboses involving both lower extremities. There was evidence of deep vein reflux in the right and left common femoral veins. She also had evidence of great saphenous vein reflux noted only at the level of the saphenofemoral junction. No evidence of greater or small saphenous vein reflux. She will need a consult to the vascular surgeon. Seen by Dr. Mathis Fare office note dated 11/13/2014. He noted that she had good arterial blood flow with palpable DP pulses bilaterally. Based on the patient's history Dr. Scot Dock recommended elevation when at rest of both feet and knees and ankles above heart level in the supine position. Activity such as walking and pool exercises were recommended. She will also have 15-20 mm thigh-high compression stockings daily once the ulcers have completely healed. A prescription was given to her. 11/04/2015 -- the wound on her right lower extremity has completely healed and her edema has gone down significantly. However in her popliteal fossa where she's got a lot of fungal infection there is an area which is now an open wound and this will need attention. 12/02/2015 -- the right lower extremity looks much better than the edema has gone down significantly however she continues to have significant edema on the left side today. 12/09/2015 -- the patient has been doing her dressings as advised and says that overall she feels much better. She has received her juxta light for the left lower extremity READMISSION 06/25/2021 This is a patient who is actually had several previous visits to our clinic in 2014, 2016 and more recently 2017. This was largely because of wounds on her lower legs in the setting of chronic venous insufficiency and secondary lymphedema. Looking at my records she was apparently  prescribed thigh-high compression stockings probably by vein and vascular although she comes back in not having worn any stockings in  quite a period of time. She is now in the assisted living part of [facility]. She thinks her wounds have been there since April. This includes the left lateral, right anterior and right posterior lateral parts of both lower legs. She also has a stage II pressure ulcer on her left glued I think which is happened because of sleeping in a lift chair. She had ABDs and kerlix on her bilateral lower legs. Past medical history reviewed she has hypothyroidism, left buttock pressure ulcer dating back to March of this year, iron deficiency, falls, paroxysmal atrial fibrillation anticoagulated and chronic venous insufficiency ABIs in our clinic were 0.86 on the right and 0.96 on the left 11/10; patient comes into clinic today after being admitted last week with uncontrolled lymphedema, severe venous insufficiency and some superficial wounds on her bilateral lower legs. We put her in 3 layer compression and she comes in today with all the wounds closed and considerable improvement in the condition of her lower legs. The patient is going to need lifelong compression stockings probably juxta lites and we are going to go ahead and order those for her today. Unfortunately the POA here is a niece in [place] were probably going to have to talk with her to arrange proper payment for the juxta lite stockings. She is in the assisted living level of [facility]. She does not feel she can get the stockings on independently I am hopeful that the nurse and aids at the assisted living can help her with this otherwise this may be a problem. She also has an area on her left buttock which is a stage II wound. A lot of unhealthy irritated skin around this I think from moisture prolonged sittingo Incontinence 11/17; the patient's area on the left  buttock is just about closed although there is still a lot of unhealthy looking discolored skin around a large area here. I suspect this is chronic moisture, pressure, perhaps incontinence. she has minuscule open areas on her bilateral lower legs. Apparently our intermediary did not get the order for juxta lite stockings therefore she does not have any. 12/1; both leg wounds are healed and she has been wearing bilateral juxta lite stockings for 2 days. [facility] are [facility] her with this. The left buttock wound unfortunately has deteriorated 12/15; 2-week follow-up. Her legs remain healed she has the other area which is on her left buttock. This is surrounded circumferentially by chronically irritated skin. Mirror image on the right side as well. I suspect this is chronic pressure friction and moisture from incontinence. This makes it difficult to get this area to completely close. We have been using silver alginate 12/29 both her legs remain closed and she has bilateral juxta lite stockings. The real problem here is her left buttock she has a small wound in the mid part of a extremely irritated dermatitis. It almost looks bruised in spots. There is superficial loss of epithelium. The actual wound that we have been dealing with does not look so bad. I have been assuming this is some combination of friction pressure wetness from constant urinary incontinence. If there is a dermatologic issue outside of that here I am not seeing it 09/03/2021; patient with a small area on the left buttock. The real problem here is a marked surrounding erythematous dermatitis with loss of surface epithelium. It looks as though she is beginning to develop satellite lesions this may be mostly fungal 1/26; the area  on her left buttock is closed however she still has very irritated macerated skin in this area. Some of this appears fungal. I treated this with ketoconazole last time and I think it looks somewhat  better although there is still some area present that still looks like a fungal dermatitis. Everything on the right buttock looks a lot better. READMISSION 12/28/2021: The patient returns today with a reopening of a wound on her left buttock. She says it has been present for about 6 months; I am not sure how this is possible since she was seen for similar in January and subsequently discharged from the clinic. Nonetheless, she has some purplish discoloration of her buttock with an open area and some scarring around the perimeter of a shallow stage II ulcer on her buttock. According to the previous clinic notes, there was some question as to whether or not this was due to fungal breakdown of her skin. She currently resides at [facility], in the assisted living section. She says that she cannot see the site and is unable to care for it herself. 01/11/2022: The wound is a little bit smaller today but has a very friable surface. It is an odd wound. On further inspection, it is almost as if there are dilated vessels and the underlying skin as I can compress the surrounding tissue, it dents in, and then fills back up, as if with fluid or blood. She did have an episode during the week when she had a lot of blood come from the wound, but is not actively bleeding today. 01/25/2022: No significant change to the wound. She continues to have purple discoloration of her buttocks, but it does still blanch. 02/08/2022: The wound was measured at slightly larger today, but to my inspection it appears about the same. It is most unusual and certainly not consistent with a typical pressure ulcer. I think it is potentially related to what ever is causing the purplish discoloration of her buttocks. Remains friable and bleeds easily with minimal manipulation. 02/22/2022: No real difference today. The base is clean without any accumulation of slough or hypertrophic granulation tissue. I still think this may be a dermatological  issue such as a venous lake or some variant of telangiectasia. 03/15/2022: The wound is unchanged. It bled fairly profusely when her dressing was removed. She has not yet received an appointment with dermatology. Electronic Signature(s) Signed: 03/15/2022 2:32:34 PM By: Fredirick Maudlin MD FACS Entered By: Fredirick Maudlin on 03/15/2022 14:32:34 -------------------------------------------------------------------------------- Chemical Cauterization Details Patient Name: Date of Service: Katrina Amel D. 03/15/2022 1:15 PM Medical Record Number: [id number] Patient Account Number: [id number] Date of Birth/Sex: Treating RN: 02/26/1932 (88 y.o. Katrina [name] Primary Care Provider: [name] Other Clinician: Referring Provider: Treating Provider/Extender: Kathlyn Sacramento in Treatment: 11 Procedure Performed for: Wound #14 Left Gluteus Performed By: Physician Fredirick Maudlin, MD Post Procedure Diagnosis Same as Pre-procedure Notes using silver nitrate stick Electronic Signature(s) Signed: 03/15/2022 3:41:11 PM By: Fredirick Maudlin MD FACS Signed: 03/15/2022 5:10:34 PM By: Blanche East RN Entered By: Blanche East on 03/15/2022 14:11:10 -------------------------------------------------------------------------------- Physical Exam Details Patient Name: Date of Service: Katrina Amel D. 03/15/2022 1:15 PM Medical Record Number: [id number] Patient Account Number: [id number] Date of Birth/Sex: Treating RN: 03-12-1932 (88 y.o. Katrina [name] Primary Care Provider: [name] Other Clinician: Referring Provider: Treating Provider/Extender: Kathlyn Sacramento in Treatment: 11 Constitutional Hypertensive, asymptomatic. . . . No acute distress.Marland Kitchen Respiratory Normal work of breathing on room air.Marland Kitchen  Notes 03/15/2022: There has been no change to the wound. It remains friable and appears to be associated with some sort of venous  formation. Electronic Signature(s) Signed: 03/15/2022 2:33:26 PM By: Fredirick Maudlin MD FACS Entered By: Fredirick Maudlin on 03/15/2022 14:33:26 -------------------------------------------------------------------------------- Physician Orders Details Patient Name: Date of Service: Katrina Amel D. 03/15/2022 1:15 PM Medical Record Number: [id number] Patient Account Number: [id number] Date of Birth/Sex: Treating RN: 1931/09/25 (88 y.o. Katrina [name] Primary Care Provider: [name] Other Clinician: Referring Provider: Treating Provider/Extender: Kathlyn Sacramento in Treatment: 11 Verbal / Phone Orders: No Diagnosis Coding ICD-10 Coding Code Description [phone] Pressure ulcer of left buttock, stage 2 I87.2 Venous insufficiency (chronic) (peripheral) I10 Essential (primary) hypertension Follow-up Appointments Return appointment in 3 weeks. - Dr. Celine Ahr RM 1 Monday 8/14 @ 1:15 am Bathing/ Shower/ Hygiene May shower and wash wound with soap and water. Off-Loading Turn and reposition every 2 hours - stand for a few minutes at least every hour during the day Wound Treatment Wound #14 - Gluteus Wound Laterality: Left Cleanser: Soap and Water 1 x Per Day/30 Days Discharge Instructions: May shower and wash wound with dial antibacterial soap and water prior to dressing change. Prim Dressing: KerraCel Ag Gelling Fiber Dressing, 2x2 in (silver alginate) 1 x Per Day/30 Days ary Discharge Instructions: Apply silver alginate to wound bed as instructed Secondary Dressing: ALLEVYN Gentle Border, 4x4 (in/in) 1 x Per Day/30 Days Discharge Instructions: Apply over primary dressing as directed. [facility] evaluate atypical cutaneous vascular lesion of left gluteus Electronic Signature(s) Signed: 03/15/2022 3:41:11 PM By: Fredirick Maudlin MD FACS Entered By: Fredirick Maudlin on 03/15/2022 14:34:09 Prescription  03/15/2022 -------------------------------------------------------------------------------- Shahan, Darryll Capers D. Fredirick Maudlin MD Patient Name: Provider: Jul 15, 1932 [id number] Date of Birth: NPI#: [id number] Sex: DEA #: [phone] Phone #: License #: [place] Patient Address: [address], [place] [address] [place], [place] [address] [phone] Allergies penicillin; Cephalosporins; codeine Provider's Orders Dermatology - [facility] evaluate atypical cutaneous vascular lesion of left gluteus Hand Signature: Date(s): Electronic Signature(s) Signed: 03/15/2022 2:35:16 PM By: Fredirick Maudlin MD FACS Entered By: Fredirick Maudlin on 03/15/2022 14:35:16 -------------------------------------------------------------------------------- Problem List Details Patient Name: Date of Service: Katrina Amel D. 03/15/2022 1:15 PM Medical Record Number: [id number] Patient Account Number: [id number] Date of Birth/Sex: Treating RN: 10-21-1931 (88 y.o. Katrina [name] Primary Care Provider: Other Clinician: [name] Referring Provider: Treating Provider/Extender: Kathlyn Sacramento in Treatment: 11 Active Problems ICD-10 Encounter Code Description Active Date MDM Diagnosis [phone] Pressure ulcer of left buttock, stage 2 12/28/2021 No Yes I87.2 Venous insufficiency (chronic) (peripheral) 12/28/2021 No Yes I10 Essential (primary) hypertension 12/28/2021 No Yes Inactive Problems Resolved Problems Electronic Signature(s) Signed: 03/15/2022 2:31:57 PM By: Fredirick Maudlin MD FACS Entered By: Fredirick Maudlin on 03/15/2022 14:31:57 -------------------------------------------------------------------------------- Progress Note Details Patient Name: Date of Service: Katrina Amel D. 03/15/2022 1:15 PM Medical Record Number: [id number] Patient Account Number:  [id number] Date of Birth/Sex: Treating RN: 1931-10-26 (88 y.o. Katrina [name] Primary Care Provider: [name] Other Clinician: Referring Provider: Treating Provider/Extender: Kathlyn Sacramento in Treatment: 11 Subjective Chief Complaint Information obtained from Patient Patient returns to the wound care center for reopened ulcer to: Left buttock History of Present Illness (HPI) The following HPI elements were documented for the patient's wound: Location: right lower extremity Quality: Patient reports experiencing a dull pain to  affected area(s). Severity: Patient states wound(s) are getting better. Duration: Patient has had the wound for 3 months prior to presenting for treatment Context: The wound would happen gradually Modifying Factors: Patient wound(s)/ulcer(s) are worsening due to :standing for a long while. Past medical history of pulmonary emboli, hypothyroidism, hyperlipidemia, hypertension, sleep apnea, gallstones, ulcerated lower extremities, atrial fibrillation, status post total knee replacement, cholecystectomy, cystoscopy and stent placement for kidney stones. She has never been a smoker. About a year ago she was seen by [name] with an ulcer of her right lower extremity which she's had for about over 3 months. On 10/23/14 -- Lower extremity venous duplex evaluation. She had no evidence of deep venous thromboses involving both lower extremities. There was evidence of deep vein reflux in the right and left common femoral veins. She also had evidence of great saphenous vein reflux noted only at the level of the saphenofemoral junction. No evidence of greater or small saphenous vein reflux. She will need a consult to the vascular surgeon. Seen by Dr. Mathis Fare office note dated 11/13/2014. He noted that she had good arterial blood flow with palpable DP pulses bilaterally. Based on the patient's history Dr. Scot Dock recommended elevation when at rest of  both feet and knees and ankles above heart level in the supine position. Activity such as walking and pool exercises were recommended. She will also have 15-20 mm thigh-high compression stockings daily once the ulcers have completely healed. A prescription was given to her. 11/04/2015 -- the wound on her right lower extremity has completely healed and her edema has gone down significantly. However in her popliteal fossa where she's got a lot of fungal infection there is an area which is now an open wound and this will need attention. 12/02/2015 -- the right lower extremity looks much better than the edema has gone down significantly however she continues to have significant edema on the left side today. 12/09/2015 -- the patient has been doing her dressings as advised and says that overall she feels much better. She has received her juxta light for the left lower extremity READMISSION 06/25/2021 This is a patient who is actually had several previous visits to our clinic in 2014, 2016 and more recently 2017. This was largely because of wounds on her lower legs in the setting of chronic venous insufficiency and secondary lymphedema. Looking at my records she was apparently prescribed thigh-high compression stockings probably by vein and vascular although she comes back in not having worn any stockings in quite a period of time. She is now in the assisted living part of [facility]. She thinks her wounds have been there since April. This includes the left lateral, right anterior and right posterior lateral parts of both lower legs. She also has a stage II pressure ulcer on her left glued I think which is happened because of sleeping in a lift chair. She had ABDs and kerlix on her bilateral lower legs. Past medical history reviewed she has hypothyroidism, left buttock pressure ulcer dating back to March of this year, iron deficiency, falls, paroxysmal atrial fibrillation  anticoagulated and chronic venous insufficiency ABIs in our clinic were 0.86 on the right and 0.96 on the left 11/10; patient comes into clinic today after being admitted last week with uncontrolled lymphedema, severe venous insufficiency and some superficial wounds on her bilateral lower legs. We put her in 3 layer compression and she comes in today with all the wounds closed and considerable improvement in the condition  of her lower legs. The patient is going to need lifelong compression stockings probably juxta lites and we are going to go ahead and order those for her today. Unfortunately the POA here is a niece in [place] were probably going to have to talk with her to arrange proper payment for the juxta lite stockings. She is in the assisted living level of [facility]. She does not feel she can get the stockings on independently I am hopeful that the nurse and aids at the assisted living can help her with this otherwise this may be a problem. She also has an area on her left buttock which is a stage II wound. A lot of unhealthy irritated skin around this I think from moisture prolonged sittingo Incontinence 11/17; the patient's area on the left buttock is just about closed although there is still a lot of unhealthy looking discolored skin around a large area here. I suspect this is chronic moisture, pressure, perhaps incontinence. she has minuscule open areas on her bilateral lower legs. Apparently our intermediary did not get the order for juxta lite stockings therefore she does not have any. 12/1; both leg wounds are healed and she has been wearing bilateral juxta lite stockings for 2 days. [facility] are assisting her with this. The left buttock wound unfortunately has deteriorated 12/15; 2-week follow-up. Her legs remain healed she has the other area which is on her left buttock. This is surrounded circumferentially by chronically irritated skin. Mirror image  on the right side as well. I suspect this is chronic pressure friction and moisture from incontinence. This makes it difficult to get this area to completely close. We have been using silver alginate 12/29 both her legs remain closed and she has bilateral juxta lite stockings. The real problem here is her left buttock she has a small wound in the mid part of a extremely irritated dermatitis. It almost looks bruised in spots. There is superficial loss of epithelium. The actual wound that we have been dealing with does not look so bad. I have been assuming this is some combination of friction pressure wetness from constant urinary incontinence. If there is a dermatologic issue outside of that here I am not seeing it 09/03/2021; patient with a small area on the left buttock. The real problem here is a marked surrounding erythematous dermatitis with loss of surface epithelium. It looks as though she is beginning to develop satellite lesions this may be mostly fungal 1/26; the area on her left buttock is closed however she still has very irritated macerated skin in this area. Some of this appears fungal. I treated this with ketoconazole last time and I think it looks somewhat better although there is still some area present that still looks like a fungal dermatitis. Everything on the right buttock looks a lot better. READMISSION 12/28/2021: The patient returns today with a reopening of a wound on her left buttock. She says it has been present for about 6 months; I am not sure how this is possible since she was seen for similar in January and subsequently discharged from the clinic. Nonetheless, she has some purplish discoloration of her buttock with an open area and some scarring around the perimeter of a shallow stage II ulcer on her buttock. According to the previous clinic notes, there was some question as to whether or not this was due to fungal breakdown of her skin. She currently resides at [facility], in the assisted living  section. She says that she cannot see the site and is unable to care for it herself. 01/11/2022: The wound is a little bit smaller today but has a very friable surface. It is an odd wound. On further inspection, it is almost as if there are dilated vessels and the underlying skin as I can compress the surrounding tissue, it dents in, and then fills back up, as if with fluid or blood. She did have an episode during the week when she had a lot of blood come from the wound, but is not actively bleeding today. 01/25/2022: No significant change to the wound. She continues to have purple discoloration of her buttocks, but it does still blanch. 02/08/2022: The wound was measured at slightly larger today, but to my inspection it appears about the same. It is most unusual and certainly not consistent with a typical pressure ulcer. I think it is potentially related to what ever is causing the purplish discoloration of her buttocks. Remains friable and bleeds easily with minimal manipulation. 02/22/2022: No real difference today. The base is clean without any accumulation of slough or hypertrophic granulation tissue. I still think this may be a dermatological issue such as a venous lake or some variant of telangiectasia. 03/15/2022: The wound is unchanged. It bled fairly profusely when her dressing was removed. She has not yet received an appointment with dermatology. Patient History Information obtained from Patient. Family History Cancer - Siblings, Heart Disease - Mother,Father, Hypertension - Father, No family history of Hereditary Spherocytosis, Kidney Disease, Lung Disease, Seizures, Stroke, Thyroid Problems, Tuberculosis. Social History Never smoker, Marital Status - Single, Alcohol Use - Never, Drug Use - No History, Caffeine Use - Rarely. Medical History Eyes Denies history of Cataracts, Glaucoma, Optic Neuritis Ear/Nose/Mouth/Throat Denies history of Chronic sinus  problems/congestion, Middle ear problems Hematologic/Lymphatic Denies history of Anemia, Hemophilia, Human Immunodeficiency Virus, Lymphedema, Sickle Cell Disease Respiratory Patient has history of Sleep Apnea - does not use Cpap or 02 as ordered Denies history of Aspiration, Asthma, Chronic Obstructive Pulmonary Disease (COPD), Pneumothorax, Tuberculosis Cardiovascular Patient has history of Arrhythmia - A-Fib, Hypertension, Phlebitis Denies history of Angina, Congestive Heart Failure, Coronary Artery Disease, Deep Vein Thrombosis, Hypotension, Myocardial Infarction, Peripheral Arterial Disease, Peripheral Venous Disease, Vasculitis Gastrointestinal Denies history of Cirrhosis , Colitis, Crohnoos, Hepatitis A, Hepatitis B, Hepatitis C Endocrine Denies history of Type I Diabetes, Type II Diabetes Genitourinary Denies history of End Stage Renal Disease Immunological Denies history of Lupus Erythematosus, Raynaudoos, Scleroderma Integumentary (Skin) Denies history of History of Burn Musculoskeletal Denies history of Gout, Rheumatoid Arthritis, Osteoarthritis, Osteomyelitis Neurologic Denies history of Dementia, Neuropathy, Quadriplegia, Paraplegia, Seizure Disorder Oncologic Denies history of Received Chemotherapy, Received Radiation Psychiatric Denies history of Anorexia/bulimia, Confinement Anxiety Medical A Surgical History Notes nd Cardiovascular Hyperlipidemia Endocrine Hypothyroidism Objective Constitutional Hypertensive, asymptomatic. No acute distress.. Vitals Time Taken: 1:45 PM, Temperature: 98.5 F, Pulse: 70 bpm, Respiratory Rate: 18 breaths/min, Blood Pressure: 188/81 mmHg. Respiratory Normal work of breathing on room air.. General Notes: 03/15/2022: There has been no change to the wound. It remains friable and appears to be associated with some sort of venous formation. Integumentary (Hair, Skin) Wound #14 status is Open. Original cause of wound was Pressure  Injury. The date acquired was: 08/23/2021. The wound has been in treatment 11 weeks. The wound is located on the Left Gluteus. The wound measures 0.3cm length x 0.8cm width x 0.5cm depth; 0.188cm^2 area and 0.094cm^3 volume. There is Fat Layer (Subcutaneous Tissue) exposed. There is no tunneling  or undermining noted. There is a medium amount of sanguinous drainage noted. The wound margin is epibole. There is large (67-100%) red, friable granulation within the wound bed. There is no necrotic tissue within the wound bed. Assessment Active Problems ICD-10 Pressure ulcer of left buttock, stage 2 Venous insufficiency (chronic) (peripheral) Essential (primary) hypertension Procedures Wound #14 Pre-procedure diagnosis of Wound #14 is a Pressure Ulcer located on the Left Gluteus . An Chemical Cauterization procedure was performed by Fredirick Maudlin, MD. Post procedure Diagnosis Wound #14: Same as Pre-Procedure Notes: using silver nitrate stick Plan Follow-up Appointments: Return appointment in 3 weeks. - Dr. Celine Ahr RM 1 Monday 8/14 @ 1:15 am Bathing/ Shower/ Hygiene: May shower and wash wound with soap and water. 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 Prim Dressing: KerraCel Ag Gelling Fiber Dressing, 2x2 in (silver alginate) 1 x Per Day/30 Days ary Discharge Instructions: Apply silver alginate to wound bed as instructed Secondary Dressing: ALLEVYN Gentle Border, 4x4 (in/in) 1 x Per Day/30 Days Discharge Instructions: Apply over primary dressing as directed. 03/15/2022: I used silver nitrate to chemically  cauterize the friable tissue. There really is no change to this wound and I am not certain of its exact etiology. We will continue silver alginate and foam border dressing and resend the referral to dermatology. Follow-up in 3 weeks. Electronic Signature(s) Signed: 03/15/2022 2:34:57 PM By: Fredirick Maudlin MD FACS Entered By: Fredirick Maudlin on 03/15/2022 14:34:57 -------------------------------------------------------------------------------- HxROS Details Patient Name: Date of Service: Katrina Amel D. 03/15/2022 1:15 PM Medical Record Number: [id number] Patient Account Number: [id number] Date of Birth/Sex: Treating RN: 1932/05/14 (88 y.o. Katrina [name] Primary Care Provider: [name] Other Clinician: Referring Provider: Treating Provider/Extender: Kathlyn [place] in Treatment: 11 Information Obtained From Patient Eyes Medical History: Negative for: Cataracts; Glaucoma; Optic Neuritis Ear/Nose/Mouth/Throat Medical History: Negative for: Chronic sinus problems/congestion; Middle ear problems Hematologic/Lymphatic Medical History: Negative for: Anemia; Hemophilia; Human Immunodeficiency Virus; Lymphedema; Sickle Cell Disease Respiratory Medical History: Positive for: Sleep Apnea - does not use Cpap or 02 as ordered Negative for: Aspiration; Asthma; Chronic Obstructive Pulmonary Disease (COPD); Pneumothorax; Tuberculosis Cardiovascular Medical History: Positive for: Arrhythmia - A-Fib; Hypertension; Phlebitis Negative for: Angina; Congestive Heart Failure; Coronary Artery Disease; Deep Vein Thrombosis; Hypotension; Myocardial Infarction; Peripheral Arterial Disease; Peripheral Venous Disease; Vasculitis Past Medical History Notes: Hyperlipidemia Gastrointestinal Medical History: Negative for: Cirrhosis ; Colitis; Crohns; Hepatitis A; Hepatitis B; Hepatitis C Endocrine Medical History: Negative for: Type I Diabetes; Type II Diabetes Past  Medical History Notes: Hypothyroidism Genitourinary Medical History: Negative for: End Stage Renal Disease Immunological Medical History: Negative for: Lupus Erythematosus; Raynauds; Scleroderma Integumentary (Skin) Medical History: Negative for: History of Burn Musculoskeletal Medical History: Negative for: Gout; Rheumatoid Arthritis; Osteoarthritis; Osteomyelitis Neurologic Medical History: Negative for: Dementia; Neuropathy; Quadriplegia; Paraplegia; Seizure Disorder Oncologic Medical History: Negative for: Received Chemotherapy; Received Radiation Psychiatric Medical History: Negative for: Anorexia/bulimia; Confinement Anxiety Immunizations Pneumococcal Vaccine: Received Pneumococcal Vaccination: Yes Received Pneumococcal Vaccination On or After 60th Birthday: No Implantable Devices None Family and Social History Cancer: Yes - Siblings; Heart Disease: Yes - Mother,Father; Hereditary Spherocytosis: No; Hypertension: Yes - Father; Kidney Disease:  No; Lung Disease: No; Seizures: No; Stroke: No; Thyroid Problems: No; Tuberculosis: No; Never smoker; Marital Status - Single; Alcohol Use: Never; Drug Use: No History; Caffeine Use: Rarely; Financial Concerns: No; Food, Clothing or Shelter Needs: No; Support System Lacking: No; Transportation Concerns: No Engineer, maintenance) Signed: 03/15/2022 3:41:11 PM By: Fredirick Maudlin MD FACS Signed: 03/15/2022 5:44:10 PM By: Baruch Gouty RN, BSN Entered By: Fredirick Maudlin on 03/15/2022 14:32:39 -------------------------------------------------------------------------------- SuperBill Details Patient Name: Date of Service: Katrina Amel D. 03/15/2022 Medical Record Number: [id number] Patient Account Number: [id number] Date of Birth/Sex: Treating RN: 25-Jun-1932 (88 y.o. Katrina [name] Primary Care Provider: [name] Other Clinician: Referring Provider: Treating Provider/Extender: Kathlyn [place] in Treatment: 11 Diagnosis Coding ICD-10 Codes Code Description [phone] Pressure ulcer of left buttock, stage 2 I87.2 Venous insufficiency (chronic) (peripheral) I10 Essential (primary) hypertension Facility Procedures CPT4 Code: [id number] Description: 10175 - CHEM CAUT GRANULATION TISS ICD-10 Diagnosis Description L89.322 Pressure ulcer of left buttock, stage 2 Modifier: Quantity: 1 Physician Procedures : CPT4 Code Description Modifier [id number] - WC PHYS LEVEL 3 - EST PT ICD-10 Diagnosis Description L89.322 Pressure ulcer of left buttock, stage 2 I87.2 Venous insufficiency (chronic) (peripheral) I10 Essential (primary) hypertension Quantity: 1 : [id number] - WC PHYS CHEM CAUT GRAN TISSUE ICD-10 Diagnosis Description L89.322 Pressure ulcer of left buttock, stage 2 Quantity: 1 Electronic Signature(s) Signed: 03/15/2022 2:35:13 PM By: Fredirick Maudlin MD FACS Entered By: Fredirick Maudlin on 03/15/2022 14:35:12

## 2022-04-05 ENCOUNTER — Encounter (HOSPITAL_BASED_OUTPATIENT_CLINIC_OR_DEPARTMENT_OTHER): Payer: Medicare Other | Admitting: General Surgery

## 2022-04-09 ENCOUNTER — Encounter (HOSPITAL_BASED_OUTPATIENT_CLINIC_OR_DEPARTMENT_OTHER): Payer: Medicare Other | Attending: General Surgery | Admitting: General Surgery

## 2022-04-09 DIAGNOSIS — I10 Essential (primary) hypertension: Secondary | ICD-10-CM | POA: Insufficient documentation

## 2022-04-09 DIAGNOSIS — I872 Venous insufficiency (chronic) (peripheral): Secondary | ICD-10-CM | POA: Insufficient documentation

## 2022-04-09 DIAGNOSIS — L89322 Pressure ulcer of left buttock, stage 2: Secondary | ICD-10-CM | POA: Insufficient documentation

## 2022-04-09 DIAGNOSIS — I890 Lymphedema, not elsewhere classified: Secondary | ICD-10-CM | POA: Diagnosis not present

## 2022-04-09 NOTE — Progress Notes (Addendum)
Katrina, [name] ([id number]) Visit Report for 04/09/2022 Chief Complaint Document Details Patient Name: Date of Service: [name], [name] 04/09/2022 1:15 PM Medical Record Number: [id number] Patient Account Number: [id number] Date of Birth/Sex: Treating RN: 28-Apr-1932 (88 y.o. F) Primary Care Provider: [name] Other Clinician: Referring Provider: Treating Provider/Extender: Kathlyn Sacramento in Treatment: 14 Information Obtained from: Patient Chief Complaint Patient returns to the wound care center for reopened ulcer to: Left buttock Electronic Signature(s) Signed: 04/09/2022 2:34:32 PM By: Fredirick Maudlin MD FACS Entered By: Fredirick Maudlin on 04/09/2022 14:34:32 -------------------------------------------------------------------------------- HPI Details Patient Name: Date of Service: Katrina Amel D. 04/09/2022 1:15 PM Medical Record Number: [id number] Patient Account Number: [id number] Date of Birth/Sex: Treating RN: 02-19-1932 (88 y.o. F) Primary Care Provider: [name] Other Clinician: Referring Provider: Treating Provider/Extender: Kathlyn Sacramento in Treatment: 14 History of Present Illness Location: right lower extremity Quality: Patient reports experiencing a dull pain to affected area(s). Severity: Patient states wound(s) are getting better. Duration: Patient has had the wound for 3 months prior to presenting for treatment Context: The wound would happen gradually Modifying Factors: Patient wound(s)/ulcer(s) are worsening due to :standing for a long while. HPI Description: Past medical history of pulmonary emboli, hypothyroidism, hyperlipidemia, hypertension, sleep apnea, gallstones, ulcerated lower extremities, atrial fibrillation, status post total knee replacement, cholecystectomy, cystoscopy and stent placement for kidney stones. She has never been a smoker. About a year ago she was seen by [name] with an ulcer of her  right lower extremity which she's had for about over 3 months. On 10/23/14 -- Lower extremity venous duplex evaluation. She had no evidence of deep venous thromboses involving both lower extremities. There was evidence of deep vein reflux in the right and left common femoral veins. She also had evidence of great saphenous vein reflux noted only at the level of the saphenofemoral junction. No evidence of greater or small saphenous vein reflux. She will need a consult to the vascular surgeon. Seen by Dr. Mathis Fare office note dated 11/13/2014. He noted that she had good arterial blood flow with palpable DP pulses bilaterally. Based on the patient's history Dr. Scot Dock recommended elevation when at rest of both feet and knees and ankles above heart level in the supine position. Activity such as walking and pool exercises were recommended. She will also have 15-20 mm thigh-high compression stockings daily once the ulcers have completely healed. A prescription was given to her. 11/04/2015 -- the wound on her right lower extremity has completely healed and her edema has gone down significantly. However in her popliteal fossa where she's got a lot of fungal infection there is an area which is now an open wound and this will need attention. 12/02/2015 -- the right lower extremity looks much better than the edema has gone down significantly however she continues to have significant edema on the left side today. 12/09/2015 -- the patient has been doing her dressings as advised and says that overall she feels much better. She has received her juxta light for the left lower extremity READMISSION 06/25/2021 This is a patient who is actually had several previous visits to our clinic in 2014, 2016 and more recently 2017. This was largely because of wounds on her lower legs in the setting of chronic venous insufficiency and secondary lymphedema. Looking at my records she was apparently prescribed  thigh-high compression stockings probably by vein and vascular although she comes back in not having worn any stockings in quite a period of  time. She is now in the assisted living part of [facility]. She thinks her wounds have been there since April. This includes the left lateral, right anterior and right posterior lateral parts of both lower legs. She also has a stage II pressure ulcer on her left glued I think which is happened because of sleeping in a lift chair. She had ABDs and kerlix on her bilateral lower legs. Past medical history reviewed she has hypothyroidism, left buttock pressure ulcer dating back to March of this year, iron deficiency, falls, paroxysmal atrial fibrillation anticoagulated and chronic venous insufficiency ABIs in our clinic were 0.86 on the right and 0.96 on the left 11/10; patient comes into clinic today after being admitted last week with uncontrolled lymphedema, severe venous insufficiency and some superficial wounds on her bilateral lower legs. We put her in 3 layer compression and she comes in today with all the wounds closed and considerable improvement in the condition of her lower legs. The patient is going to need lifelong compression stockings probably juxta lites and we are going to go ahead and order those for her today. Unfortunately the POA here is a niece in [place] were probably going to have to talk with her to arrange proper payment for the juxta lite stockings. She is in the assisted living level of [facility]. She does not feel she can get the stockings on independently I am hopeful that the nurse and aids at the assisted living can help her with this otherwise this may be a problem. She also has an area on her left buttock which is a stage II wound. A lot of unhealthy irritated skin around this I think from moisture prolonged sittingo Incontinence 11/17; the patient's area on the left buttock is just  about closed although there is still a lot of unhealthy looking discolored skin around a large area here. I suspect this is chronic moisture, pressure, perhaps incontinence. she has minuscule open areas on her bilateral lower legs. Apparently our intermediary did not get the order for juxta lite stockings therefore she does not have any. 12/1; both leg wounds are healed and she has been wearing bilateral juxta lite stockings for 2 days. [facility] are [facility] her with this. The left buttock wound unfortunately has deteriorated 12/15; 2-week follow-up. Her legs remain healed she has the other area which is on her left buttock. This is surrounded circumferentially by chronically irritated skin. Mirror image on the right side as well. I suspect this is chronic pressure friction and moisture from incontinence. This makes it difficult to get this area to completely close. We have been using silver alginate 12/29 both her legs remain closed and she has bilateral juxta lite stockings. The real problem here is her left buttock she has a small wound in the mid part of a extremely irritated dermatitis. It almost looks bruised in spots. There is superficial loss of epithelium. The actual wound that we have been dealing with does not look so bad. I have been assuming this is some combination of friction pressure wetness from constant urinary incontinence. If there is a dermatologic issue outside of that here I am not seeing it 09/03/2021; patient with a small area on the left buttock. The real problem here is a marked surrounding erythematous dermatitis with loss of surface epithelium. It looks as though she is beginning to develop satellite lesions this may be mostly fungal 1/26; the area on her left buttock  is closed however she still has very irritated macerated skin in this area. Some of this appears fungal. I treated this with ketoconazole last time and I think it looks somewhat better although  there is still some area present that still looks like a fungal dermatitis. Everything on the right buttock looks a lot better. READMISSION 12/28/2021: The patient returns today with a reopening of a wound on her left buttock. She says it has been present for about 6 months; I am not sure how this is possible since she was seen for similar in January and subsequently discharged from the clinic. Nonetheless, she has some purplish discoloration of her buttock with an open area and some scarring around the perimeter of a shallow stage II ulcer on her buttock. According to the previous clinic notes, there was some question as to whether or not this was due to fungal breakdown of her skin. She currently resides at [facility], in the assisted living section. She says that she cannot see the site and is unable to care for it herself. 01/11/2022: The wound is a little bit smaller today but has a very friable surface. It is an odd wound. On further inspection, it is almost as if there are dilated vessels and the underlying skin as I can compress the surrounding tissue, it dents in, and then fills back up, as if with fluid or blood. She did have an episode during the week when she had a lot of blood come from the wound, but is not actively bleeding today. 01/25/2022: No significant change to the wound. She continues to have purple discoloration of her buttocks, but it does still blanch. 02/08/2022: The wound was measured at slightly larger today, but to my inspection it appears about the same. It is most unusual and certainly not consistent with a typical pressure ulcer. I think it is potentially related to what ever is causing the purplish discoloration of her buttocks. Remains friable and bleeds easily with minimal manipulation. 02/22/2022: No real difference today. The base is clean without any accumulation of slough or hypertrophic granulation tissue. I still think this may be a dermatological issue such as a  venous lake or some variant of telangiectasia. 03/15/2022: The wound is unchanged. It bled fairly profusely when her dressing was removed. She has not yet received an appointment with dermatology. 04/09/2022: The orifice of the wound is smaller but the exposed tissue is still quite friable. She has an appointment with dermatology on August 22. Electronic Signature(s) Signed: 04/09/2022 2:35:05 PM By: Fredirick Maudlin MD FACS Entered By: Fredirick Maudlin on 04/09/2022 14:35:05 -------------------------------------------------------------------------------- Physical Exam Details Patient Name: Date of Service: Katrina Amel D. 04/09/2022 1:15 PM Medical Record Number: [id number] Patient Account Number: [id number] Date of Birth/Sex: Treating RN: [birth year] (88 y.o. F) Primary Care Provider: [name] Other Clinician: Referring Provider: Treating Provider/Extender: Kathlyn Sacramento in Treatment: 14 Constitutional Hypertensive, asymptomatic. . . . No acute distress.Marland Kitchen Respiratory Normal work of breathing on room air.. Notes 04/09/2022: The orifice of the wound is smaller but the exposed tissue is still quite friable. Electronic Signature(s) Signed: 04/09/2022 2:37:40 PM By: Fredirick Maudlin MD FACS Entered By: Fredirick Maudlin on 04/09/2022 14:37:39 -------------------------------------------------------------------------------- Physician Orders Details Patient Name: Date of Service: Katrina Amel D. 04/09/2022 1:15 PM Medical Record Number: [id number] Patient Account Number: [id number] Date of Birth/Sex: Treating RN: 09/15/1931 (88 y.o. [name] Primary Care Provider: [name] Other Clinician: Referring Provider: Treating Provider/Extender: Lasandra Beech  Weeks in Treatment: 14 Verbal / Phone Orders: No Diagnosis Coding ICD-10 Coding Code Description L89.322 Pressure ulcer of left buttock, stage 2 I87.2 Venous insufficiency  (chronic) (peripheral) I10 Essential (primary) hypertension Follow-up Appointments Return appointment in 3 weeks. - Dr. Celine Ahr RM 1 Tuesday 05/04/22 @ 1:15 am Anesthetic Wound #14 Left Gluteus (In clinic) Topical Lidocaine 4% applied to wound bed Bathing/ Shower/ Hygiene May shower and wash wound with soap and water. Off-Loading Turn and reposition every 2 hours - stand for a few minutes at least every hour during the day Wound Treatment Wound #14 - Gluteus Wound Laterality: Left Cleanser: Soap and Water 1 x Per Day/30 Days Discharge Instructions: May shower and wash wound with dial antibacterial soap and water prior to dressing change. Prim Dressing: KerraCel Ag Gelling Fiber Dressing, 2x2 in (silver alginate) 1 x Per Day/30 Days ary Discharge Instructions: Apply silver alginate to wound bed as instructed Secondary Dressing: ALLEVYN Gentle Border, 4x4 (in/in) 1 x Per Day/30 Days Discharge Instructions: Apply over primary dressing as directed. Patient Medications llergies: penicillin, Cephalosporins, codeine A Notifications Medication Indication Start End prior to debridement 04/09/2022 lidocaine DOSE topical 4 % cream - cream topical Electronic Signature(s) Signed: 04/09/2022 4:05:21 PM By: Fredirick Maudlin MD FACS Entered By: Fredirick Maudlin on 04/09/2022 14:37:52 -------------------------------------------------------------------------------- Problem List Details Patient Name: Date of Service: Katrina Amel D. 04/09/2022 1:15 PM Medical Record Number: [id number] Patient Account Number: [id number] Date of Birth/Sex: Treating RN: 1931/11/11 (88 y.o. [name] Primary Care Provider: [name] Other Clinician: Referring Provider: Treating Provider/Extender: Kathlyn Sacramento in Treatment: 14 Active Problems ICD-10 Encounter Code Description Active Date MDM Diagnosis [phone] Pressure ulcer of left buttock, stage 2 12/28/2021 No Yes I87.2  Venous insufficiency (chronic) (peripheral) 12/28/2021 No Yes I10 Essential (primary) hypertension 12/28/2021 No Yes Inactive Problems Resolved Problems Electronic Signature(s) Signed: 04/09/2022 2:31:47 PM By: Fredirick Maudlin MD FACS Entered By: Fredirick Maudlin on 04/09/2022 14:31:47 -------------------------------------------------------------------------------- Progress Note Details Patient Name: Date of Service: Katrina Amel D. 04/09/2022 1:15 PM Medical Record Number: [id number] Patient Account Number: [id number] Date of Birth/Sex: Treating RN: [birth year] (88 y.o. F) Primary Care Provider: [name] Other Clinician: Referring Provider: Treating Provider/Extender: Kathlyn Sacramento in Treatment: 14 Subjective Chief Complaint Information obtained from Patient Patient returns to the wound care center for reopened ulcer to: Left buttock History of Present Illness (HPI) The following HPI elements were documented for the patient's wound: Location: right lower extremity Quality: Patient reports experiencing a dull pain to affected area(s). Severity: Patient states wound(s) are getting better. Duration: Patient has had the wound for 3 months prior to presenting for treatment Context: The wound would happen gradually Modifying Factors: Patient wound(s)/ulcer(s) are worsening due to :standing for a long while. Past medical history of pulmonary emboli, hypothyroidism, hyperlipidemia, hypertension, sleep apnea, gallstones, ulcerated lower extremities, atrial fibrillation, status post total knee replacement, cholecystectomy, cystoscopy and stent placement for kidney stones. She has never been a smoker. About a year ago she was seen by [name] with an ulcer of her right lower extremity which she's had for about over 3 months. On 10/23/14 -- Lower extremity venous duplex evaluation. She had no evidence of deep venous thromboses involving both lower extremities. There  was evidence of deep vein reflux in the right and left common femoral veins. She also had evidence of great saphenous vein reflux noted only at the level of the saphenofemoral junction. No evidence of greater or small saphenous vein reflux. She  will need a consult to the vascular surgeon. Seen by Dr. Mathis Fare office note dated 11/13/2014. He noted that she had good arterial blood flow with palpable DP pulses bilaterally. Based on the patient's history Dr. Scot Dock recommended elevation when at rest of both feet and knees and ankles above heart level in the supine position. Activity such as walking and pool exercises were recommended. She will also have 15-20 mm thigh-high compression stockings daily once the ulcers have completely healed. A prescription was given to her. 11/04/2015 -- the wound on her right lower extremity has completely healed and her edema has gone down significantly. However in her popliteal fossa where she's got a lot of fungal infection there is an area which is now an open wound and this will need attention. 12/02/2015 -- the right lower extremity looks much better than the edema has gone down significantly however she continues to have significant edema on the left side today. 12/09/2015 -- the patient has been doing her dressings as advised and says that overall she feels much better. She has received her juxta light for the left lower extremity READMISSION 06/25/2021 This is a patient who is actually had several previous visits to our clinic in 2014, 2016 and more recently 2017. This was largely because of wounds on her lower legs in the setting of chronic venous insufficiency and secondary lymphedema. Looking at my records she was apparently prescribed thigh-high compression stockings probably by vein and vascular although she comes back in not having worn any stockings in quite a period of time. She is now in the assisted living part of [facility]. She thinks her wounds have been there since April. This includes the left lateral, right anterior and right posterior lateral parts of both lower legs. She also has a stage II pressure ulcer on her left glued I think which is happened because of sleeping in a lift chair. She had ABDs and kerlix on her bilateral lower legs. Past medical history reviewed she has hypothyroidism, left buttock pressure ulcer dating back to March of this year, iron deficiency, falls, paroxysmal atrial fibrillation anticoagulated and chronic venous insufficiency ABIs in our clinic were 0.86 on the right and 0.96 on the left 11/10; patient comes into clinic today after being admitted last week with uncontrolled lymphedema, severe venous insufficiency and some superficial wounds on her bilateral lower legs. We put her in 3 layer compression and she comes in today with all the wounds closed and considerable improvement in the condition of her lower legs. The patient is going to need lifelong compression stockings probably juxta lites and we are going to go ahead and order those for her today. Unfortunately the POA here is a niece in [place] were probably going to have to talk with her to arrange proper payment for the juxta lite stockings. She is in the assisted living level of [facility]. She does not feel she can get the stockings on independently I am hopeful that the nurse and aids at the assisted living can help her with this otherwise this may be a problem. She also has an area on her left buttock which is a stage II wound. A lot of unhealthy irritated skin around this I think from moisture prolonged sittingo Incontinence 11/17; the patient's area on the left buttock is just about closed although there is still a lot of unhealthy looking discolored skin around a large area here. I suspect this is chronic moisture, pressure,  perhaps incontinence. she has minuscule open areas on her bilateral  lower legs. Apparently our intermediary did not get the order for juxta lite stockings therefore she does not have any. 12/1; both leg wounds are healed and she has been wearing bilateral juxta lite stockings for 2 days. [facility] are assisting her with this. The left buttock wound unfortunately has deteriorated 12/15; 2-week follow-up. Her legs remain healed she has the other area which is on her left buttock. This is surrounded circumferentially by chronically irritated skin. Mirror image on the right side as well. I suspect this is chronic pressure friction and moisture from incontinence. This makes it difficult to get this area to completely close. We have been using silver alginate 12/29 both her legs remain closed and she has bilateral juxta lite stockings. The real problem here is her left buttock she has a small wound in the mid part of a extremely irritated dermatitis. It almost looks bruised in spots. There is superficial loss of epithelium. The actual wound that we have been dealing with does not look so bad. I have been assuming this is some combination of friction pressure wetness from constant urinary incontinence. If there is a dermatologic issue outside of that here I am not seeing it 09/03/2021; patient with a small area on the left buttock. The real problem here is a marked surrounding erythematous dermatitis with loss of surface epithelium. It looks as though she is beginning to develop satellite lesions this may be mostly fungal 1/26; the area on her left buttock is closed however she still has very irritated macerated skin in this area. Some of this appears fungal. I treated this with ketoconazole last time and I think it looks somewhat better although there is still some area present that still looks like a fungal dermatitis. Everything on the right buttock looks a lot better. READMISSION 12/28/2021: The patient returns today with a reopening of a wound on her left  buttock. She says it has been present for about 6 months; I am not sure how this is possible since she was seen for similar in January and subsequently discharged from the clinic. Nonetheless, she has some purplish discoloration of her buttock with an open area and some scarring around the perimeter of a shallow stage II ulcer on her buttock. According to the previous clinic notes, there was some question as to whether or not this was due to fungal breakdown of her skin. She currently resides at [facility], in the assisted living section. She says that she cannot see the site and is unable to care for it herself. 01/11/2022: The wound is a little bit smaller today but has a very friable surface. It is an odd wound. On further inspection, it is almost as if there are dilated vessels and the underlying skin as I can compress the surrounding tissue, it dents in, and then fills back up, as if with fluid or blood. She did have an episode during the week when she had a lot of blood come from the wound, but is not actively bleeding today. 01/25/2022: No significant change to the wound. She continues to have purple discoloration of her buttocks, but it does still blanch. 02/08/2022: The wound was measured at slightly larger today, but to my inspection it appears about the same. It is most unusual and certainly not consistent with a typical pressure ulcer. I think it is potentially related to what ever is causing the purplish discoloration of her  buttocks. Remains friable and bleeds easily with minimal manipulation. 02/22/2022: No real difference today. The base is clean without any accumulation of slough or hypertrophic granulation tissue. I still think this may be a dermatological issue such as a venous lake or some variant of telangiectasia. 03/15/2022: The wound is unchanged. It bled fairly profusely when her dressing was removed. She has not yet received an appointment with dermatology. 04/09/2022: The orifice  of the wound is smaller but the exposed tissue is still quite friable. She has an appointment with dermatology on August 22. Patient History Information obtained from Patient. Family History Cancer - Siblings, Heart Disease - Mother,Father, Hypertension - Father, No family history of Hereditary Spherocytosis, Kidney Disease, Lung Disease, Seizures, Stroke, Thyroid Problems, Tuberculosis. Social History Never smoker, Marital Status - Single, Alcohol Use - Never, Drug Use - No History, Caffeine Use - Rarely. Medical History Eyes Denies history of Cataracts, Glaucoma, Optic Neuritis Ear/Nose/Mouth/Throat Denies history of Chronic sinus problems/congestion, Middle ear problems Hematologic/Lymphatic Denies history of Anemia, Hemophilia, Human Immunodeficiency Virus, Lymphedema, Sickle Cell Disease Respiratory Patient has history of Sleep Apnea - does not use Cpap or 02 as ordered Denies history of Aspiration, Asthma, Chronic Obstructive Pulmonary Disease (COPD), Pneumothorax, Tuberculosis Cardiovascular Patient has history of Arrhythmia - A-Fib, Hypertension, Phlebitis Denies history of Angina, Congestive Heart Failure, Coronary Artery Disease, Deep Vein Thrombosis, Hypotension, Myocardial Infarction, Peripheral Arterial Disease, Peripheral Venous Disease, Vasculitis Gastrointestinal Denies history of Cirrhosis , Colitis, Crohnoos, Hepatitis A, Hepatitis B, Hepatitis C Endocrine Denies history of Type I Diabetes, Type II Diabetes Genitourinary Denies history of End Stage Renal Disease Immunological Denies history of Lupus Erythematosus, Raynaudoos, Scleroderma Integumentary (Skin) Denies history of History of Burn Musculoskeletal Denies history of Gout, Rheumatoid Arthritis, Osteoarthritis, Osteomyelitis Neurologic Denies history of Dementia, Neuropathy, Quadriplegia, Paraplegia, Seizure Disorder Oncologic Denies history of Received Chemotherapy, Received  Radiation Psychiatric Denies history of Anorexia/bulimia, Confinement Anxiety Medical A Surgical History Notes nd Cardiovascular Hyperlipidemia Endocrine Hypothyroidism Objective Constitutional Hypertensive, asymptomatic. No acute distress.. Vitals Time Taken: 1:56 PM, Temperature: 97.9 F, Pulse: 71 bpm, Respiratory Rate: 18 breaths/min, Blood Pressure: 161/82 mmHg. Respiratory Normal work of breathing on room air.. General Notes: 04/09/2022: The orifice of the wound is smaller but the exposed tissue is still quite friable. Integumentary (Hair, Skin) Wound #14 status is Open. Original cause of wound was Pressure Injury. The date acquired was: 08/23/2021. The wound has been in treatment 14 weeks. The wound is located on the Left Gluteus. The wound measures 0.2cm length x 0.4cm width x 0.2cm depth; 0.063cm^2 area and 0.013cm^3 volume. There is Fat Layer (Subcutaneous Tissue) exposed. There is no tunneling or undermining noted. There is a small amount of serosanguineous drainage noted. The wound margin is epibole. There is large (67-100%) red, friable granulation within the wound bed. There is a small (1-33%) amount of necrotic tissue within the wound bed including Adherent Slough. Assessment Active Problems ICD-10 Pressure ulcer of left buttock, stage 2 Venous insufficiency (chronic) (peripheral) Essential (primary) hypertension Plan Follow-up Appointments: Return appointment in 3 weeks. - Dr. Celine Ahr RM 1 Tuesday 05/04/22 @ 1:15 am Anesthetic: Wound #14 Left Gluteus: (In clinic) Topical Lidocaine 4% applied to wound bed Bathing/ Shower/ Hygiene: May shower and wash wound with soap and water. Off-Loading: Turn and reposition every 2 hours - stand for a few minutes at least every hour during the day The following medication(s) was prescribed: lidocaine topical 4 % cream cream topical for prior to debridement was prescribed at facility WOUND #14: -  Gluteus Wound Laterality:  Left Cleanser: Soap and Water 1 x Per Day/30 Days Discharge Instructions: May shower and wash wound with dial antibacterial soap and water prior to dressing change. Prim Dressing: KerraCel Ag Gelling Fiber Dressing, 2x2 in (silver alginate) 1 x Per Day/30 Days ary Discharge Instructions: Apply silver alginate to wound bed as instructed Secondary Dressing: ALLEVYN Gentle Border, 4x4 (in/in) 1 x Per Day/30 Days Discharge Instructions: Apply over primary dressing as directed. 04/09/2022: The orifice of the wound is smaller but the exposed tissue is still quite friable. No debridement was necessary today. We will continue to apply silver alginate to the wound with a foam border dressing. I will be very interested to learn what dermatology has to say about the wound. Follow-up in 3 weeks. Electronic Signature(s) Signed: 04/09/2022 2:38:17 PM By: Fredirick Maudlin MD FACS Entered By: Fredirick Maudlin on 04/09/2022 14:38:17 -------------------------------------------------------------------------------- HxROS Details Patient Name: Date of Service: Katrina Amel D. 04/09/2022 1:15 PM Medical Record Number: [id number] Patient Account Number: [id number] Date of Birth/Sex: Treating RN: [birth year] (88 y.o. F) Primary Care Provider: [name] Other Clinician: Referring Provider: Treating Provider/Extender: Kathlyn [place] in Treatment: 14 Information Obtained From Patient Eyes Medical History: Negative for: Cataracts; Glaucoma; Optic Neuritis Ear/Nose/Mouth/Throat Medical History: Negative for: Chronic sinus problems/congestion; Middle ear problems Hematologic/Lymphatic Medical History: Negative for: Anemia; Hemophilia; Human Immunodeficiency Virus; Lymphedema; Sickle Cell Disease Respiratory Medical History: Positive for: Sleep Apnea - does not use Cpap or 02 as ordered Negative for: Aspiration; Asthma; Chronic Obstructive Pulmonary Disease (COPD); Pneumothorax;  Tuberculosis Cardiovascular Medical History: Positive for: Arrhythmia - A-Fib; Hypertension; Phlebitis Negative for: Angina; Congestive Heart Failure; Coronary Artery Disease; Deep Vein Thrombosis; Hypotension; Myocardial Infarction; Peripheral Arterial Disease; Peripheral Venous Disease; Vasculitis Past Medical History Notes: Hyperlipidemia Gastrointestinal Medical History: Negative for: Cirrhosis ; Colitis; Crohns; Hepatitis A; Hepatitis B; Hepatitis C Endocrine Medical History: Negative for: Type I Diabetes; Type II Diabetes Past Medical History Notes: Hypothyroidism Genitourinary Medical History: Negative for: End Stage Renal Disease Immunological Medical History: Negative for: Lupus Erythematosus; Raynauds; Scleroderma Integumentary (Skin) Medical History: Negative for: History of Burn Musculoskeletal Medical History: Negative for: Gout; Rheumatoid Arthritis; Osteoarthritis; Osteomyelitis Neurologic Medical History: Negative for: Dementia; Neuropathy; Quadriplegia; Paraplegia; Seizure Disorder Oncologic Medical History: Negative for: Received Chemotherapy; Received Radiation Psychiatric Medical History: Negative for: Anorexia/bulimia; Confinement Anxiety Immunizations Pneumococcal Vaccine: Received Pneumococcal Vaccination: Yes Received Pneumococcal Vaccination On or After 60th Birthday: No Implantable Devices None Family and Social History Cancer: Yes - Siblings; Heart Disease: Yes - Mother,Father; Hereditary Spherocytosis: No; Hypertension: Yes - Father; Kidney Disease: No; Lung Disease: No; Seizures: No; Stroke: No; Thyroid Problems: No; Tuberculosis: No; Never smoker; Marital Status - Single; Alcohol Use: Never; Drug Use: No History; Caffeine Use: Rarely; Financial Concerns: No; Food, Clothing or Shelter Needs: No; Support System Lacking: No; Transportation Concerns: No Engineer, maintenance) Signed: 04/09/2022 4:05:21 PM By: Fredirick Maudlin MD FACS Signed:  04/09/2022 4:05:21 PM By: Fredirick Maudlin MD FACS Entered By: Fredirick Maudlin on 04/09/2022 14:37:14 -------------------------------------------------------------------------------- [place] Details Patient Name: Date of Service: Katrina Amel D. 04/09/2022 Medical Record Number: [id number] Patient Account Number: [id number] Date of Birth/Sex: Treating RN: 1932/08/14 (88 y.o. F) Primary Care Provider: [name] Other Clinician: Referring Provider: Treating Provider/Extender: Kathlyn [place] in Treatment: 14 Diagnosis Coding ICD-10 Codes Code Description [id number] Pressure ulcer of left buttock, stage 2 I87.2 Venous insufficiency (chronic) (peripheral) I10 Essential (primary) hypertension Facility Procedures CPT4 Code: [id number] Description: 99213 - WOUND CARE VISIT-LEV 3 EST  PT Modifier: Quantity: 1 Physician Procedures : CPT4 Code Description Modifier [id number] 54982 - WC PHYS LEVEL 3 - EST PT ICD-10 Diagnosis Description L89.322 Pressure ulcer of left buttock, stage 2 I87.2 Venous insufficiency (chronic) (peripheral) I10 Essential (primary) hypertension Quantity: 1 Electronic Signature(s) Signed: 04/22/2022 9:00:10 AM By: Deon Pilling RN, BSN Signed: 05/06/2022 10:03:28 AM By: Fredirick Maudlin MD FACS Previous Signature: 04/09/2022 2:38:31 PM Version By: Fredirick Maudlin MD FACS Entered By: Deon Pilling on 04/22/2022 09:00:10

## 2022-04-09 NOTE — Progress Notes (Addendum)
Katrina [name], Katrina [name] ([id number]) Visit Report for 04/09/2022 Arrival Information Details Patient Name: Date of Service: Katrina [name], Katrina [name] 04/09/2022 1:15 PM Medical Record Number: [id number] Patient Account Number: [id number] Date of Birth/Sex: Treating RN: 08/26/1931 (88 y.o. Katrina [name] Primary Care Eswin Worrell: [name] Other Clinician: Referring Ronniesha Seibold: Treating Sheria Rosello/Extender: Kathlyn [place] in Treatment: 14 Visit Information History Since Last Visit Added or deleted any medications: No Patient Arrived: Katrina [name] Any new allergies or adverse reactions: No Arrival Time: 13:55 Had a fall or experienced change in No Accompanied By: self activities of daily living that may affect Transfer Assistance: Manual risk of falls: Patient Identification Verified: Yes Signs or symptoms of abuse/neglect since last visito No Secondary Verification Process Completed: Yes Hospitalized since last visit: No Patient Requires Transmission-Based Precautions: No Implantable device outside of the clinic excluding No Patient Has Alerts: No cellular tissue based products placed in the center since last visit: Has Dressing in Place as Prescribed: Yes Pain Present Now: No Electronic Signature(s) Signed: 04/09/2022 5:36:29 PM By: Baruch Gouty RN, BSN Entered By: Baruch Gouty on 04/09/2022 13:56:26 -------------------------------------------------------------------------------- Clinic Level of Care Assessment Details Patient Name: Date of Service: Katrina Amel D. 04/09/2022 1:15 PM Medical Record Number: [id number] Patient Account Number: [id number] Date of Birth/Sex: Treating RN: Dec 31, 1931 (88 y.o. Katrina [name], Katrina [name] Primary Care Gwenette Wellons: [name] Other Clinician: Referring Kiasha Bellin: Treating Teodoro Jeffreys/Extender: Kathlyn [place] in Treatment: 14 Clinic Level of Care Assessment Items TOOL 4 Quantity Score X- 1 0 Use when only an EandM  is performed on FOLLOW-UP visit ASSESSMENTS - Nursing Assessment / Reassessment X- 1 10 Reassessment of Co-morbidities (includes updates in patient status) X- 1 5 Reassessment of Adherence to Treatment Plan ASSESSMENTS - Wound and Skin A ssessment / Reassessment X - Simple Wound Assessment / Reassessment - one wound 1 5 '[]'  - 0 Complex Wound Assessment / Reassessment - multiple wounds '[]'  - 0 Dermatologic / Skin Assessment (not related to wound area) ASSESSMENTS - Focused Assessment '[]'  - 0 Circumferential Edema Measurements - multi extremities '[]'  - 0 Nutritional Assessment / Counseling / Intervention '[]'  - 0 Lower Extremity Assessment (monofilament, tuning fork, pulses) '[]'  - 0 Peripheral Arterial Disease Assessment (using hand held doppler) ASSESSMENTS - Ostomy and/or Continence Assessment and Care '[]'  - 0 Incontinence Assessment and Management '[]'  - 0 Ostomy Care Assessment and Management (repouching, etc.) PROCESS - Coordination of Care X - Simple Patient / Family Education for ongoing care 1 15 '[]'  - 0 Complex (extensive) Patient / Family Education for ongoing care X- 1 10 Staff obtains Programmer, systems, Records, T Results / Process Orders est '[]'  - 0 Staff telephones HHA, Nursing Homes / Clarify orders / etc '[]'  - 0 Routine Transfer to another Facility (non-emergent condition) '[]'  - 0 Routine Hospital Admission (non-emergent condition) '[]'  - 0 New Admissions / Biomedical engineer / Ordering NPWT Apligraf, etc. , '[]'  - 0 Emergency Hospital Admission (emergent condition) X- 1 10 Simple Discharge Coordination '[]'  - 0 Complex (extensive) Discharge Coordination PROCESS - Special Needs '[]'  - 0 Pediatric / Minor Patient Management '[]'  - 0 Isolation Patient Management '[]'  - 0 Hearing / Language / Visual special needs '[]'  - 0 Assessment of Community assistance (transportation, D/C planning, etc.) '[]'  - 0 Additional assistance / Altered mentation '[]'  - 0 Support Surface(s) Assessment  (bed, cushion, seat, etc.) INTERVENTIONS - Wound Cleansing / Measurement X - Simple Wound Cleansing - one wound 1 5 '[]'  - 0 Complex Wound Cleansing - multiple wounds  X- 1 5 Wound Imaging (photographs - any number of wounds) '[]'  - 0 Wound Tracing (instead of photographs) X- 1 5 Simple Wound Measurement - one wound '[]'  - 0 Complex Wound Measurement - multiple wounds INTERVENTIONS - Wound Dressings X - Small Wound Dressing one or multiple wounds 1 10 '[]'  - 0 Medium Wound Dressing one or multiple wounds '[]'  - 0 Large Wound Dressing one or multiple wounds '[]'  - 0 Application of Medications - topical '[]'  - 0 Application of Medications - injection INTERVENTIONS - Miscellaneous '[]'  - 0 External ear exam '[]'  - 0 Specimen Collection (cultures, biopsies, blood, body fluids, etc.) '[]'  - 0 Specimen(s) / Culture(s) sent or taken to Lab for analysis '[]'  - 0 Patient Transfer (multiple staff / Civil Service fast streamer / Similar devices) '[]'  - 0 Simple Staple / Suture removal (25 or less) '[]'  - 0 Complex Staple / Suture removal (26 or more) '[]'  - 0 Hypo / Hyperglycemic Management (close monitor of Blood Glucose) '[]'  - 0 Ankle / Brachial Index (ABI) - do not check if billed separately X- 1 5 Vital Signs Has the patient been seen at the hospital within the last three years: Yes Total Score: 85 Level Of Care: New/Established - Level 3 Electronic Signature(s) Signed: 04/22/2022 10:51:03 AM By: Deon Pilling RN, BSN Entered By: Deon Pilling on 04/22/2022 08:59:59 -------------------------------------------------------------------------------- Lower Extremity Assessment Details Patient Name: Date of Service: Katrina Amel D. 04/09/2022 1:15 PM Medical Record Number: [id number] Patient Account Number: [id number] Date of Birth/Sex: Treating RN: [birth year] (88 y.o. Katrina [name] Primary Care Xzavion Doswell: [name] Other Clinician: Referring Mekaela Azizi: Treating Georgenia Salim/Extender: Kathlyn [place] in Treatment: 14 Electronic Signature(s) Signed: 04/09/2022 5:36:29 PM By: Baruch Gouty RN, BSN Entered By: Baruch Gouty on 04/09/2022 13:57:08 -------------------------------------------------------------------------------- Multi Wound Chart Details Patient Name: Date of Service: Katrina Amel D. 04/09/2022 1:15 PM Medical Record Number: [id number] Patient Account Number: [id number] Date of Birth/Sex: Treating RN: 06-May-1932 (88 y.o. F) Primary Care Hassell Patras: [name] Other Clinician: Referring Tadeusz Stahl: Treating Akane Tessier/Extender: Kathlyn [place] in Treatment: 14 Vital Signs Height(in): Pulse(bpm): 25 Weight(lbs): Blood Pressure(mmHg): 161/82 Body Mass Index(BMI): Temperature(F): 97.9 Respiratory Rate(breaths/min): 18 Photos: [N/A:N/A] Left Gluteus N/A N/A Wound Location: Pressure Injury N/A N/A Wounding Event: Pressure Ulcer N/A N/A Primary Etiology: Sleep Apnea, Arrhythmia, N/A N/A Comorbid History: Hypertension, Phlebitis 08/23/2021 N/A N/A Date Acquired: 14 N/A N/A Weeks of Treatment: Open N/A N/A Wound Status: No N/A N/A Wound Recurrence: 0.2x0.4x0.2 N/A N/A Measurements L x W x D (cm) 0.063 N/A N/A A (cm) : rea 0.013 N/A N/A Volume (cm) : 95.00% N/A N/A % Reduction in A rea: 94.80% N/A N/A % Reduction in Volume: Category/Stage III N/A N/A Classification: Small N/A N/A Exudate A mount: Serosanguineous N/A N/A Exudate Type: red, brown N/A N/A Exudate Color: Epibole N/A N/A Wound Margin: Large (67-100%) N/A N/A Granulation A mount: Red, Friable N/A N/A Granulation Quality: Small (1-33%) N/A N/A Necrotic A mount: Fat Layer (Subcutaneous Tissue): Yes N/A N/A Exposed Structures: Fascia: No Tendon: No Muscle: No Joint: No Bone: No Small (1-33%) N/A N/A Epithelialization: Treatment Notes Electronic Signature(s) Signed: 04/09/2022 2:34:26 PM By: Fredirick Maudlin MD FACS Entered By: Fredirick Maudlin on 04/09/2022 14:34:26 -------------------------------------------------------------------------------- Multi-Disciplinary Care Plan Details Patient Name: Date of Service: Katrina Amel D. 04/09/2022 1:15 PM Medical Record Number: [id number] Patient Account Number: [id number] Date of Birth/Sex: Treating RN: 01-15-1932 (88 y.o. Katrina [name] Primary Care Coda Mathey: [name] Other Clinician: Referring Kenderick Kobler: Treating  Menucha Dicesare/Extender: Kathlyn [place] in Treatment: [place] reviewed with physician Active Inactive Abuse / Safety / Falls / Self Care Management Nursing Diagnoses: Potential for falls Goals: Patient/caregiver will verbalize/demonstrate measures taken to prevent injury and/or falls Date Initiated: 12/28/2021 Target Resolution Date: 05/07/2022 Goal Status: Active Interventions: Assess fall risk on admission and as needed Assess impairment of mobility on admission and as needed per policy Notes: Nutrition Nursing Diagnoses: Imbalanced nutrition Potential for alteratiion in Nutrition/Potential for imbalanced nutrition Goals: Patient/caregiver agrees to and verbalizes understanding of need to use nutritional supplements and/or vitamins as prescribed Date Initiated: 12/28/2021 Target Resolution Date: 05/07/2022 Goal Status: Active Interventions: Assess patient nutrition upon admission and as needed per policy Provide education on nutrition Treatment Activities: Patient referred to Primary Care Physician for further nutritional evaluation : 12/28/2021 Notes: Pressure Nursing Diagnoses: Knowledge deficit related to causes and risk factors for pressure ulcer development Knowledge deficit related to management of pressures ulcers Potential for impaired tissue integrity related to pressure, friction, moisture, and shear Goals: Patient/caregiver will verbalize understanding of pressure ulcer management Date  Initiated: 12/28/2021 Target Resolution Date: 05/07/2022 Goal Status: Active Interventions: Assess: immobility, friction, shearing, incontinence upon admission and as needed Assess potential for pressure ulcer upon admission and as needed Notes: Wound/Skin Impairment Nursing Diagnoses: Impaired tissue integrity Knowledge deficit related to ulceration/compromised skin integrity Goals: Patient/caregiver will verbalize understanding of skin care regimen Date Initiated: 12/28/2021 Target Resolution Date: 05/07/2022 Goal Status: Active Ulcer/skin breakdown will have a volume reduction of 30% by week 4 Date Initiated: 12/28/2021 Date Inactivated: 01/25/2022 Target Resolution Date: 01/25/2022 Goal Status: Met Ulcer/skin breakdown will have a volume reduction of 50% by week 8 Date Initiated: 01/25/2022 Date Inactivated: 02/22/2022 Target Resolution Date: 02/22/2022 Unmet Reason: atypical wound, derm Goal Status: Unmet consult Interventions: Assess patient/caregiver ability to obtain necessary supplies Assess patient/caregiver ability to perform ulcer/skin care regimen upon admission and as needed Assess ulceration(s) every visit Provide education on ulcer and skin care Treatment Activities: Skin care regimen initiated : 12/28/2021 Topical wound management initiated : 12/28/2021 Notes: Electronic Signature(s) Signed: 04/09/2022 5:36:29 PM By: Baruch Gouty RN, BSN Entered By: Baruch Gouty on 04/09/2022 14:04:10 -------------------------------------------------------------------------------- Pain Assessment Details Patient Name: Date of Service: Katrina Amel D. 04/09/2022 1:15 PM Medical Record Number: [id number] Patient Account Number: [id number] Date of Birth/Sex: Treating RN: 07-20-1932 (88 y.o. Katrina [name] Primary Care Sheral Pfahler: [name] Other Clinician: Referring Keyairra Kolinski: Treating Ngoc Detjen/Extender: Kathlyn [place] in Treatment: 14 Active  Problems Location of Pain Severity and Description of Pain Patient Has Paino No Site Locations Rate the pain. Current Pain Level: 0 Pain Management and Medication Current Pain Management: Electronic Signature(s) Signed: 04/09/2022 5:36:29 PM By: Baruch Gouty RN, BSN Entered By: Baruch Gouty on 04/09/2022 13:57:02 -------------------------------------------------------------------------------- Patient/Caregiver Education Details Patient Name: Date of Service: [place] 8/18/2023andnbsp1:15 PM Medical Record Number: [id number] Patient Account Number: [id number] Date of Birth/Gender: Treating RN: Jun 05, 1932 (88 y.o. Katrina [name] Primary Care Physician: [name] Other Clinician: Referring Physician: Treating Physician/Extender: Kathlyn [place] in Treatment: 14 Education Assessment Education Provided To: Patient Education Topics Provided Pressure: Methods: Explain/Verbal Responses: Reinforcements needed, State content correctly Wound/Skin Impairment: Methods: Explain/Verbal Responses: Reinforcements needed, State content correctly Electronic Signature(s) Signed: 04/09/2022 5:36:29 PM By: Baruch Gouty RN, BSN Entered By: Baruch Gouty on 04/09/2022 14:04:33 -------------------------------------------------------------------------------- Wound Assessment Details Patient Name: Date of Service: Katrina Amel D. 04/09/2022 1:15 PM Medical Record Number: [id number] Patient Account Number: [id number]  Date of Birth/Sex: Treating RN: 10-Feb-1932 (88 y.o. Katrina [name] Primary Care Cristiana Yochim: [name] Other Clinician: Referring Laretha Luepke: Treating Kaniya Trueheart/Extender: Kathlyn [place] in Treatment: 14 Wound Status Wound Number: 14 Primary Etiology: Pressure Ulcer Wound Location: Left Gluteus Wound Status: Open Wounding Event: Pressure Injury Comorbid History: Sleep Apnea, Arrhythmia, Hypertension,  Phlebitis Date Acquired: 08/23/2021 Weeks Of Treatment: 14 Clustered Wound: No Photos Wound Measurements Length: (cm) 0.2 Width: (cm) 0.4 Depth: (cm) 0.2 Area: (cm) 0.063 Volume: (cm) 0.013 % Reduction in Area: 95% % Reduction in Volume: 94.8% Epithelialization: Small (1-33%) Tunneling: No Undermining: No Wound Description Classification: Category/Stage III Wound Margin: Epibole Exudate Amount: Small Exudate Type: Serosanguineous Exudate Color: red, brown Foul Odor After Cleansing: No Slough/Fibrino No Wound Bed Granulation Amount: Large (67-100%) Exposed Structure Granulation Quality: Red, Friable Fascia Exposed: No Necrotic Amount: Small (1-33%) Fat Layer (Subcutaneous Tissue) Exposed: Yes Necrotic Quality: Adherent Slough Tendon Exposed: No Muscle Exposed: No Joint Exposed: No Bone Exposed: No Electronic Signature(s) Signed: 04/09/2022 5:36:29 PM By: Baruch Gouty RN, BSN Entered By: Baruch Gouty on 04/09/2022 14:07:14 -------------------------------------------------------------------------------- Vitals Details Patient Name: Date of Service: Katrina Amel D. 04/09/2022 1:15 PM Medical Record Number: [id number] Patient Account Number: [id number] Date of Birth/Sex: Treating RN: 04/29/1932 (88 y.o. Katrina [name] Primary Care Jeanenne Licea: [name] Other Clinician: Referring Yusra Ravert: Treating Dhruvi Crenshaw/Extender: Kathlyn [place] in Treatment: 14 Vital Signs Time Taken: 13:56 Temperature (F): 97.9 Pulse (bpm): 71 Respiratory Rate (breaths/min): 18 Blood Pressure (mmHg): 161/82 Reference Range: 80 - 120 mg / dl Electronic Signature(s) Signed: 04/09/2022 5:36:29 PM By: Baruch Gouty RN, BSN Entered By: Baruch Gouty on 04/09/2022 13:56:54

## 2022-05-04 ENCOUNTER — Encounter (HOSPITAL_BASED_OUTPATIENT_CLINIC_OR_DEPARTMENT_OTHER): Payer: Medicare Other | Attending: General Surgery | Admitting: General Surgery

## 2022-05-04 DIAGNOSIS — Z86711 Personal history of pulmonary embolism: Secondary | ICD-10-CM | POA: Diagnosis not present

## 2022-05-04 DIAGNOSIS — I872 Venous insufficiency (chronic) (peripheral): Secondary | ICD-10-CM | POA: Diagnosis not present

## 2022-05-04 DIAGNOSIS — E785 Hyperlipidemia, unspecified: Secondary | ICD-10-CM | POA: Diagnosis not present

## 2022-05-04 DIAGNOSIS — E039 Hypothyroidism, unspecified: Secondary | ICD-10-CM | POA: Insufficient documentation

## 2022-05-04 DIAGNOSIS — I480 Paroxysmal atrial fibrillation: Secondary | ICD-10-CM | POA: Diagnosis not present

## 2022-05-04 DIAGNOSIS — L89322 Pressure ulcer of left buttock, stage 2: Secondary | ICD-10-CM | POA: Diagnosis present

## 2022-05-04 DIAGNOSIS — G4730 Sleep apnea, unspecified: Secondary | ICD-10-CM | POA: Insufficient documentation

## 2022-05-04 DIAGNOSIS — I890 Lymphedema, not elsewhere classified: Secondary | ICD-10-CM | POA: Insufficient documentation

## 2022-05-04 DIAGNOSIS — I10 Essential (primary) hypertension: Secondary | ICD-10-CM | POA: Diagnosis not present

## 2022-05-04 NOTE — Progress Notes (Signed)
TIRSA, GAIL ([id number]) Visit Report for 05/04/2022 Arrival Information Details Patient Name: Date of Service: Katrina [name], Katrina D. 05/04/2022 1:15 PM Medical Record Number: [id number] Patient Account Number: [id number] Date of Birth/Sex: Treating RN: 22-Nov-1931 (88 y.o. [name] Primary Care Seibert Keeter: [name] Other Clinician: Referring Arayla Kruschke: Treating Aariya Ferrick/Extender: Kathlyn [place] in Treatment: 18 Visit Information History Since Last Visit Added or deleted any medications: No Patient Arrived: Gilford Rile Any new allergies or adverse reactions: No Arrival Time: 13:56 Had a fall or experienced change in No Accompanied By: self activities of daily living that may affect Transfer Assistance: None risk of falls: Patient Identification Verified: Yes Signs or symptoms of abuse/neglect since last visito No Secondary Verification Process Completed: Yes Hospitalized since last visit: No Patient Requires Transmission-Based Precautions: No Implantable device outside of the clinic excluding No Patient Has Alerts: No cellular tissue based products placed in the center since last visit: Has Dressing in Place as Prescribed: Yes Pain Present Now: No Electronic Signature(s) Signed: 05/04/2022 4:55:43 PM By: Baruch Gouty RN, BSN Entered By: Baruch Gouty on 05/04/2022 13:57:17 -------------------------------------------------------------------------------- Lower Extremity Assessment Details Patient Name: Date of Service: Katrina Amel D. 05/04/2022 1:15 PM Medical Record Number: [id number] Patient Account Number: [id number] Date of Birth/Sex: Treating RN: 1932/07/10 (88 y.o. [name] Primary Care Vash Quezada: [name] Other Clinician: Referring Celines Femia: Treating Wakisha Alberts/Extender: Kathlyn [place] in Treatment: 18 Electronic Signature(s) Signed: 05/04/2022 4:55:43 PM By: Baruch Gouty RN, BSN Entered By:  Baruch Gouty on 05/04/2022 13:58:47 -------------------------------------------------------------------------------- Multi Wound Chart Details Patient Name: Date of Service: Katrina Amel D. 05/04/2022 1:15 PM Medical Record Number: [id number] Patient Account Number: [id number] Date of Birth/Sex: Treating RN: September 10, 1931 (88 y.o. [name] Primary Care Mariusz Jubb: [name] Other Clinician: Referring Derrisha Foos: Treating Lucion Dilger/Extender: Kathlyn [place] in Treatment: 18 Vital Signs Height(in): Pulse(bpm): 43 Weight(lbs): Blood Pressure(mmHg): 129/83 Body Mass Index(BMI): Temperature(F): 98.5 Respiratory Rate(breaths/min): 18 Photos: [N/A:N/A] Left Gluteus N/A N/A Wound Location: Pressure Injury N/A N/A Wounding Event: Pressure Ulcer N/A N/A Primary Etiology: Sleep Apnea, Arrhythmia, N/A N/A Comorbid History: Hypertension, Phlebitis 08/23/2021 N/A N/A Date Acquired: 18 N/A N/A Weeks of Treatment: Open N/A N/A Wound Status: No N/A N/A Wound Recurrence: 0.9x0.6x0.1 N/A N/A Measurements L x W x D (cm) 0.424 N/A N/A A (cm) : rea 0.042 N/A N/A Volume (cm) : 66.30% N/A N/A % Reduction in A rea: 83.30% N/A N/A % Reduction in Volume: Category/Stage III N/A N/A Classification: Small N/A N/A Exudate A mount: Serosanguineous N/A N/A Exudate Type: red, brown N/A N/A Exudate Color: Distinct, outline attached N/A N/A Wound Margin: Large (67-100%) N/A N/A Granulation A mount: Red, Friable N/A N/A Granulation Quality: None Present (0%) N/A N/A Necrotic A mount: Fat Layer (Subcutaneous Tissue): Yes N/A N/A Exposed Structures: Fascia: No Tendon: No Muscle: No Joint: No Bone: No Small (1-33%) N/A N/A Epithelialization: Debridement - Selective/Open Wound N/A N/A Debridement: Pre-procedure Verification/Time Out 14:15 N/A N/A Taken: Lidocaine 4% T opical Solution N/A N/A Pain Control: Slough N/A N/A Tissue  Debrided: Non-Viable Tissue N/A N/A Level: 0.54 N/A N/A Debridement A (sq cm): rea Curette N/A N/A Instrument: Minimum N/A N/A Bleeding: Silver Nitrate N/A N/A Hemostasis A chieved: 5 N/A N/A Procedural Pain: 3 N/A N/A Post Procedural Pain: Procedure was tolerated well N/A N/A Debridement Treatment Response: 0.9x0.6x0.2 N/A N/A Post Debridement Measurements L x W x D (cm) 0.085 N/A N/A Post Debridement Volume: (cm) Category/Stage III N/A N/A Post Debridement  Stage: Debridement N/A N/A Procedures Performed: Treatment Notes Electronic Signature(s) Signed: 05/04/2022 2:26:00 PM By: Fredirick Maudlin MD FACS Signed: 05/04/2022 4:55:43 PM By: Baruch Gouty RN, BSN Entered By: Fredirick Maudlin on 05/04/2022 14:26:00 -------------------------------------------------------------------------------- Multi-Disciplinary Care Plan Details Patient Name: Date of Service: Katrina Amel D. 05/04/2022 1:15 PM Medical Record Number: [id number] Patient Account Number: [id number] Date of Birth/Sex: Treating RN: [birth year] (88 y.o. [name] Primary Care Jaisen Wiltrout: [name] Other Clinician: Referring Orvan Papadakis: Treating Joell Buerger/Extender: Kathlyn [place] in Treatment: [place] reviewed with physician Active Inactive Wound/Skin Impairment Nursing Diagnoses: Impaired tissue integrity Knowledge deficit related to ulceration/compromised skin integrity Goals: Patient/caregiver will verbalize understanding of skin care regimen Date Initiated: 12/28/2021 Target Resolution Date: 06/02/2022 Goal Status: Active Ulcer/skin breakdown will have a volume reduction of 30% by week 4 Date Initiated: 12/28/2021 Date Inactivated: 01/25/2022 Target Resolution Date: 01/25/2022 Goal Status: Met Ulcer/skin breakdown will have a volume reduction of 50% by week 8 Date Initiated: 01/25/2022 Date Inactivated: 02/22/2022 Target Resolution Date:  02/22/2022 Unmet Reason: atypical wound, derm Goal Status: Unmet consult Interventions: Assess patient/caregiver ability to obtain necessary supplies Assess patient/caregiver ability to perform ulcer/skin care regimen upon admission and as needed Assess ulceration(s) every visit Provide education on ulcer and skin care Treatment Activities: Skin care regimen initiated : 12/28/2021 Topical wound management initiated : 12/28/2021 Notes: Electronic Signature(s) Signed: 05/04/2022 4:55:43 PM By: Baruch Gouty RN, BSN Entered By: Baruch Gouty on 05/04/2022 14:08:59 -------------------------------------------------------------------------------- Pain Assessment Details Patient Name: Date of Service: Katrina Amel D. 05/04/2022 1:15 PM Medical Record Number: [id number] Patient Account Number: [id number] Date of Birth/Sex: Treating RN: [birth year] (88 y.o. [name] Primary Care Saraya Tirey: [name] Other Clinician: Referring Eward Rutigliano: Treating Brendyn Mclaren/Extender: Kathlyn [place] in Treatment: 18 Active Problems Location of Pain Severity and Description of Pain Patient Has Paino No Patient Has Paino No Site Locations Rate the pain. Current Pain Level: 0 Pain Management and Medication Current Pain Management: Electronic Signature(s) Signed: 05/04/2022 4:55:43 PM By: Baruch Gouty RN, BSN Entered By: Baruch Gouty on 05/04/2022 13:58:41 -------------------------------------------------------------------------------- Patient/Caregiver Education Details Patient Name: Date of Service: Katrina Amel D. 9/12/2023andnbsp1:15 PM Medical Record Number: [id number] Patient Account Number: [id number] Date of Birth/Gender: Treating RN: [birth year] (88 y.o. [name] Primary Care Physician: [name] Other Clinician: Referring Physician: Treating Physician/Extender: Kathlyn [place] in Treatment: 18 Education  Assessment Education Provided To: Patient Education Topics Provided Wound/Skin Impairment: Methods: Explain/Verbal Responses: Reinforcements needed, State content correctly [employer]) Signed: 05/04/2022 4:55:43 PM By: Baruch Gouty RN, BSN Entered By: Baruch Gouty on 05/04/2022 14:09:18 -------------------------------------------------------------------------------- Wound Assessment Details Patient Name: Date of Service: Katrina Amel D. 05/04/2022 1:15 PM Medical Record Number: [id number] Patient Account Number: [id number] Date of Birth/Sex: Treating RN: 1931/12/12 (88 y.o. [name] Primary Care Korby Ratay: [name] Other Clinician: Referring Dahl Higinbotham: Treating Cartrell Bentsen/Extender: Kathlyn [place] in Treatment: 18 Wound Status Wound Number: 14 Primary Etiology: Pressure Ulcer Wound Location: Left Gluteus Wound Status: Open Wounding Event: Pressure Injury Comorbid History: Sleep Apnea, Arrhythmia, Hypertension, Phlebitis Date Acquired: 08/23/2021 Weeks Of Treatment: 18 Clustered Wound: No Photos Wound Measurements Length: (cm) 0.9 Width: (cm) 0.6 Depth: (cm) 0.1 Area: (cm) 0.424 Volume: (cm) 0.042 % Reduction in Area: 66.3% % Reduction in Volume: 83.3% Epithelialization: Small (1-33%) Tunneling: No Undermining: No Wound Description Classification: Category/Stage III Wound Margin: Distinct, outline attached Exudate Amount: Small Exudate Type: Serosanguineous Exudate Color: red, brown Foul Odor After Cleansing:  No Slough/Fibrino No Wound Bed Granulation Amount: Large (67-100%) Exposed Structure Granulation Quality: Red, Friable Fascia Exposed: No Necrotic Amount: None Present (0%) Fat Layer (Subcutaneous Tissue) Exposed: Yes Tendon Exposed: No Muscle Exposed: No Joint Exposed: No Bone Exposed: No Electronic Signature(s) Signed: 05/04/2022 4:55:43 PM By: Baruch Gouty RN, BSN Entered By: Baruch Gouty  on 05/04/2022 14:07:29 -------------------------------------------------------------------------------- [place] Details Patient Name: Date of Service: Katrina Amel D. 05/04/2022 1:15 PM Medical Record Number: [id number] Patient Account Number: [id number] Date of Birth/Sex: Treating RN: 11-20-1931 (88 y.o. [name] Primary Care Mendel Binsfeld: [name] Other Clinician: Referring Sartaj Hoskin: Treating Rhilee Currin/Extender: Kathlyn [place] in Treatment: 18 Vital Signs Time Taken: 13:57 Temperature (F): 98.5 Pulse (bpm): 67 Respiratory Rate (breaths/min): 18 Blood Pressure (mmHg): 129/83 Reference Range: 80 - 120 mg / dl Electronic Signature(s) Signed: 05/04/2022 4:55:43 PM By: Baruch Gouty RN, BSN Entered By: Baruch Gouty on 05/04/2022 13:58:04

## 2022-05-04 NOTE — Progress Notes (Signed)
Katrina [name], Katrina [name] ([id number]) Visit Report for 05/04/2022 Chief Complaint Document Details Patient Name: Date of Service: Katrina, [name] 05/04/2022 1:15 PM Medical Record Number: [id number] Patient Account Number: [id number] Date of Birth/Sex: Treating RN: 02-Apr-1932 (88 y.o. Katrina [name] Primary Care Provider: Leanna [name] Other Clinician: Referring Provider: Treating Provider/Extender: Katrina [name]: 18 Information Obtained from: Patient Chief Complaint Patient returns to the wound care center for reopened ulcer to: Left buttock Electronic Signature(s) Signed: 05/04/2022 2:26:07 PM By: Katrina Maudlin MD FACS Entered By: Katrina [name] on 05/04/2022 14:26:07 -------------------------------------------------------------------------------- Debridement Details Patient Name: Date of Service: Katrina Amel D. 05/04/2022 1:15 PM Medical Record Number: [id number] Patient Account Number: [id number] Date of Birth/Sex: Treating RN: August 16, 1932 (88 y.o. Katrina [name] Primary Care Provider: Leanna [name] Other Clinician: Referring Provider: Treating Provider/Extender: Katrina [name]: 18 Debridement Performed for Assessment: Wound #14 Left Gluteus Performed By: Physician Katrina Maudlin, MD Debridement Type: Debridement Level of Consciousness (Pre-procedure): Awake and Alert Pre-procedure Verification/Time Out Yes - 14:15 Taken: Start Time: 14:15 Pain Control: Lidocaine 4% T opical Solution T Area Debrided (L x W): otal 0.9 (cm) x 0.6 (cm) = 0.54 (cm) Tissue and other material debrided: Non-Viable, Slough, Slough Level: Non-Viable Tissue Debridement Description: Selective/Open Wound Instrument: Curette Bleeding: Minimum Hemostasis Achieved: Silver Nitrate Procedural Pain: 5 Post Procedural Pain: 3 Response to [name]: Procedure was tolerated well Level of Consciousness (Post- Awake and  Alert procedure): Post Debridement Measurements of Total Wound Length: (cm) 0.9 Stage: Category/Stage III Width: (cm) 0.6 Depth: (cm) 0.2 Volume: (cm) 0.085 Character of Wound/Ulcer Post Debridement: Improved Post Procedure Diagnosis Same as Pre-procedure Notes scribed by Katrina Gouty, RN for Dr. Celine [name] Electronic Signature(s) Signed: 05/04/2022 2:32:18 PM By: Katrina Maudlin MD FACS Signed: 05/04/2022 4:55:43 PM By: Katrina [name] Entered By: Katrina [name] on 05/04/2022 14:22:13 -------------------------------------------------------------------------------- HPI Details Patient Name: Date of Service: Katrina Amel D. 05/04/2022 1:15 PM Medical Record Number: [id number] Patient Account Number: [id number] Date of Birth/Sex: Treating RN: 08-14-1932 (88 y.o. Katrina [name] Primary Care Provider: Leanna [name] Other Clinician: Referring Provider: Treating Provider/Extender: Katrina [name]: 18 History of Present Illness Location: right lower extremity Quality: Patient reports experiencing a dull pain to affected area(s). Severity: Patient states wound(s) are getting better. Duration: Patient has had the wound for 3 months prior to presenting for [name] Context: The wound would happen gradually Modifying Factors: Patient wound(s)/ulcer(s) are worsening due to :standing for a long while. HPI Description: Past medical history of pulmonary emboli, hypothyroidism, hyperlipidemia, hypertension, sleep apnea, gallstones, ulcerated lower extremities, atrial fibrillation, status post total knee replacement, cholecystectomy, cystoscopy and stent placement for kidney stones. She has never been a smoker. About a year ago she was seen by [name] with an ulcer of her right lower extremity which she's had for about over 3 months. On 10/23/14 -- Lower extremity venous duplex evaluation. She had no evidence of deep venous thromboses involving both  lower extremities. There was evidence of deep vein reflux in the right and left common femoral veins. She also had evidence of great saphenous vein reflux noted only at the level of the saphenofemoral junction. No evidence of greater or small saphenous vein reflux. She will need a consult to the vascular surgeon. Seen by Katrina [name] office note dated 11/13/2014. He noted that she had good arterial blood flow with palpable DP pulses bilaterally. Based on the patient's history Katrina [name] recommended elevation  when at rest of both feet and knees and ankles above heart level in the supine position. Activity such as walking and pool exercises were recommended. She will also have 15-20 mm thigh-high compression stockings daily once the ulcers have completely healed. A prescription was given to her. 11/04/2015 -- the wound on her right lower extremity has completely healed and her edema has gone down significantly. However in her popliteal fossa where she's got a lot of fungal infection there is an area which is now an open wound and this will need attention. 12/02/2015 -- the right lower extremity looks much better than the edema has gone down significantly however she continues to have significant edema on the left side today. 12/09/2015 -- the patient has been doing her dressings as advised and says that overall she feels much better. She has received her juxta light for the left lower extremity READMISSION 06/25/2021 This is a patient who is actually had several previous visits to our clinic in 2014, 2016 and more recently 2017. This was largely because of wounds on her lower legs in the setting of chronic venous insufficiency and secondary lymphedema. Looking at my records she was apparently prescribed thigh-high compression stockings probably by vein and vascular although she comes back in not having worn any stockings in quite a period of time. She is [facility] in the [name] She thinks her wounds have been there since April. This includes the left lateral, right anterior and right posterior lateral parts of both lower legs. She also has a stage II pressure ulcer on her left glued I think which is happened because of sleeping in a lift chair. She had ABDs and kerlix on her bilateral lower legs. Past medical history reviewed she has hypothyroidism, left buttock pressure ulcer dating back to March of this year, iron deficiency, falls, paroxysmal atrial fibrillation anticoagulated and chronic venous insufficiency ABIs in our clinic were 0.86 on the right and 0.96 on the left 11/10; patient comes into clinic today after being admitted last week with uncontrolled lymphedema, severe venous insufficiency and some superficial wounds on her bilateral lower legs. We put her in 3 layer compression and she comes in today with all the wounds closed and considerable improvement in the condition of her lower legs. The patient is going to need lifelong compression stockings probably juxta lites and we are going to go ahead and order those for her today. Unfortunately the POA here is a niece in [place] were probably going to have to talk with her to arrange proper payment for the juxta lite stockings. She is in the [facility] living [facility] of [facility]. She does not feel she can get the stockings on independently I am hopeful that the nurse and aids at the assisted living can help her with this otherwise this may be a problem. She also has an area on her left buttock which is a stage II wound. A lot of unhealthy irritated skin around this I think from moisture prolonged sittingo Incontinence 11/17; the patient's area on the left buttock is just about closed although there is still a lot of unhealthy looking discolored skin around a large area here. I suspect this is chronic moisture, pressure, perhaps incontinence. she has minuscule open  areas on her bilateral lower legs. Apparently our intermediary did not get the order for juxta lite stockings therefore she does not have any. 12/1; both leg wounds are healed and she  has been wearing bilateral juxta lite stockings for 2 days. [facility] at [facility] are assisting her with this. The left buttock wound unfortunately has deteriorated 12/15; 2-week follow-up. Her legs remain healed she has the other area which is on her left buttock. This is surrounded circumferentially by chronically irritated skin. Mirror image on the right side as well. I suspect this is chronic pressure friction and moisture from incontinence. This makes it difficult to get this area to completely close. We have been using silver alginate 12/29 both her legs remain closed and she has bilateral juxta lite stockings. The real problem here is her left buttock she has a small wound in the mid part of a extremely irritated dermatitis. It almost looks bruised in spots. There is superficial loss of epithelium. The actual wound that we have been dealing with does not look so bad. I have been assuming this is some combination of friction pressure wetness from constant urinary incontinence. If there is a dermatologic issue outside of that here I am not seeing it 09/03/2021; patient with a small area on the left buttock. The real problem here is a marked surrounding erythematous dermatitis with loss of surface epithelium. It looks as though she is beginning to develop satellite lesions this may be mostly fungal 1/26; the area on her left buttock is closed however she still has very irritated macerated skin in this area. Some of this appears fungal. I treated this with ketoconazole last time and I think it looks somewhat better although there is still some area present that still looks like a fungal dermatitis. Everything on the right buttock looks a lot better. READMISSION 12/28/2021: The patient returns today with a reopening of  a wound on her left buttock. She says it has been present for about 6 months; I am not sure how this is possible since she was seen for similar in January and subsequently discharged from the clinic. Nonetheless, she has some purplish discoloration of her buttock with an open area and some scarring around the perimeter of a shallow stage II ulcer on her buttock. According to the previous clinic notes, there was some question as to whether or not this was due to fungal breakdown of her skin. She currently resides at [facility], in the assisted living section. She says that she cannot see the site and is unable to care for it herself. 01/11/2022: The wound is a little bit smaller today but has a very friable surface. It is an odd wound. On further inspection, it is almost as if there are dilated vessels and the underlying skin as I can compress the surrounding tissue, it dents in, and then fills back up, as if with fluid or blood. She did have an episode during the week when she had a lot of blood come from the wound, but is not actively bleeding today. 01/25/2022: No significant change to the wound. She continues to have purple discoloration of her buttocks, but it does still blanch. 02/08/2022: The wound was measured at slightly larger today, but to my inspection it appears about the same. It is most unusual and certainly not consistent with a typical pressure ulcer. I think it is potentially related to what ever is causing the purplish discoloration of her buttocks. Remains friable and bleeds easily with minimal manipulation. 02/22/2022: No real difference today. The base is clean without any accumulation of slough or hypertrophic granulation tissue. I still think this may be a dermatological issue such as a  venous lake or some variant of telangiectasia. 03/15/2022: The wound is unchanged. It bled fairly profusely when her dressing was removed. She has not yet received an appointment with  dermatology. 04/09/2022: The orifice of the wound is smaller but the exposed tissue is still quite friable. She has an appointment with dermatology on August 22. 05/04/2022: She had a biopsy done by dermatology on August 22. We do not have the results. The patient reports she was told it was "staph" and prescribed clindamycin. She took her last capsule yesterday. T oday, the wound is basically unchanged. The surface is a little bit more flush with the surrounding tissue but it remains quite friable. There is some slough accumulation on the surface. Electronic Signature(s) Signed: 05/04/2022 2:27:06 PM By: Katrina Maudlin MD FACS Entered By: Katrina [name] on 05/04/2022 14:27:06 -------------------------------------------------------------------------------- Physical Exam Details Patient Name: Date of Service: Katrina Amel D. 05/04/2022 1:15 PM Medical Record Number: [id number] Patient Account Number: [id number] Date of Birth/Sex: Treating RN: 1932/01/06 (88 y.o. Katrina [name] Primary Care Provider: Leanna [name] Other Clinician: Referring Provider: Treating Provider/Extender: Katrina [name]: 18 Constitutional . . . . No acute distress.Marland Kitchen Respiratory Normal work of breathing on room air.. Notes 05/04/2022: The wound is basically unchanged. The surface is a little bit more flush with the surrounding tissue but it remains quite friable. There is some slough accumulation on the surface. Electronic Signature(s) Signed: 05/04/2022 2:28:42 PM By: Katrina Maudlin MD FACS Entered By: Katrina [name] on 05/04/2022 14:28:42 -------------------------------------------------------------------------------- Physician Orders Details Patient Name: Date of Service: Katrina Amel D. 05/04/2022 1:15 PM Medical Record Number: [id number] Patient Account Number: [id number] Date of Birth/Sex: Treating RN: Dec 31, 1931 (88 y.o. Katrina [name] Primary Care Provider:  Leanna [name] Other Clinician: Referring Provider: Treating Provider/Extender: Katrina [name]: [phone] Verbal / Phone Orders: No Diagnosis Coding ICD-10 Coding Code Description [id number] Pressure ulcer of left buttock, stage 2 I87.2 Venous insufficiency (chronic) (peripheral) I10 Essential (primary) hypertension Follow-up Appointments Return appointment in 1 month. - Dr. Celine [name] RM 1 Anesthetic Wound #14 Left Gluteus (In clinic) Topical Lidocaine 4% applied to wound bed Bathing/ Shower/ Hygiene May shower and wash wound with soap and water. Off-Loading Turn and reposition every 2 hours - stand for a few minutes at least every hour during the day Wound [name] Wound #14 - Gluteus Wound Laterality: Left Cleanser: Soap and Water 1 x Per Day/30 Days Discharge Instructions: May shower and wash wound with dial antibacterial soap and water prior to dressing change. Prim Dressing: KerraCel Ag Gelling Fiber Dressing, 2x2 in (silver alginate) 1 x Per Day/30 Days ary Discharge Instructions: Apply silver alginate to wound bed as instructed Secondary Dressing: ALLEVYN Gentle Border, 4x4 (in/in) 1 x Per Day/30 Days Discharge Instructions: Apply over primary dressing as directed. Electronic Signature(s) Signed: 05/04/2022 2:32:18 PM By: Katrina Maudlin MD FACS Entered By: Katrina [name] on 05/04/2022 14:28:50 -------------------------------------------------------------------------------- Problem List Details Patient Name: Date of Service: Katrina Amel D. 05/04/2022 1:15 PM Medical Record Number: [id number] Patient Account Number: [id number] Date of Birth/Sex: Treating RN: Jun 09, 1932 (88 y.o. Katrina [name] Primary Care Provider: Leanna [name] Other Clinician: Referring Provider: Treating Provider/Extender: Katrina [name]: 18 Active Problems ICD-10 Encounter Code Description Active Date  MDM Diagnosis [phone] Pressure ulcer of left buttock, stage 2 12/28/2021 No Yes I87.2 Venous insufficiency (chronic) (peripheral) 12/28/2021 No Yes I10 Essential (primary) hypertension 12/28/2021 No Yes Inactive Problems Resolved Problems Electronic Signature(s) Signed:  05/04/2022 2:25:54 PM By: Katrina Maudlin MD FACS Entered By: Katrina [name] on 05/04/2022 14:25:54 -------------------------------------------------------------------------------- Progress Note Details Patient Name: Date of Service: Katrina Amel D. 05/04/2022 1:15 PM Medical Record Number: [id number] Patient Account Number: [id number] Date of Birth/Sex: Treating RN: [birth year] (88 y.o. Katrina [name] Primary Care Provider: Leanna [name] Other Clinician: Referring Provider: Treating Provider/Extender: Katrina [name]: 18 Subjective Chief Complaint Information obtained from Patient Patient returns to the wound care center for reopened ulcer to: Left buttock History of Present Illness (HPI) The following HPI elements were documented for the patient's wound: Location: right lower extremity Quality: Patient reports experiencing a dull pain to affected area(s). Severity: Patient states wound(s) are getting better. Duration: Patient has had the wound for 3 months prior to presenting for [name] Context: The wound would happen gradually Modifying Factors: Patient wound(s)/ulcer(s) are worsening due to :standing for a long while. Past medical history of pulmonary emboli, hypothyroidism, hyperlipidemia, hypertension, sleep apnea, gallstones, ulcerated lower extremities, atrial fibrillation, status post total knee replacement, cholecystectomy, cystoscopy and stent placement for kidney stones. She has never been a smoker. About a year ago she was seen by [name] with an ulcer of her right lower extremity which she's had for about over 3 months. On 10/23/14 -- Lower extremity venous duplex  evaluation. She had no evidence of deep venous thromboses involving both lower extremities. There was evidence of deep vein reflux in the right and left common femoral veins. She also had evidence of great saphenous vein reflux noted only at the level of the saphenofemoral junction. No evidence of greater or small saphenous vein reflux. She will need a consult to the vascular surgeon. Seen by Katrina [name] office note dated 11/13/2014. He noted that she had good arterial blood flow with palpable DP pulses bilaterally. Based on the patient's history Katrina [name] recommended elevation when at rest of both feet and knees and ankles above heart level in the supine position. Activity such as walking and pool exercises were recommended. She will also have 15-20 mm thigh-high compression stockings daily once the ulcers have completely healed. A prescription was given to her. 11/04/2015 -- the wound on her right lower extremity has completely healed and her edema has gone down significantly. However in her popliteal fossa where she's got a lot of fungal infection there is an area which is now an open wound and this will need attention. 12/02/2015 -- the right lower extremity looks much better than the edema has gone down significantly however she continues to have significant edema on the left side today. 12/09/2015 -- the patient has been doing her dressings as advised and says that overall she feels much better. She has received her juxta light for the left lower extremity READMISSION 06/25/2021 This is a patient who is actually had several previous visits to our clinic in 2014, 2016 and more recently 2017. This was largely because of wounds on her lower legs in the setting of chronic venous insufficiency and secondary lymphedema. Looking at my records she was apparently prescribed thigh-high compression stockings probably by vein and vascular although she comes back in not having worn any stockings in quite  a period of time. [name] She thinks her wounds have been there since April. This includes the left lateral, right anterior and right posterior lateral parts of both lower legs. She also has a stage II pressure ulcer on her  left glued I think which is happened because of sleeping in a lift chair. She had ABDs and kerlix on her bilateral lower legs. Past medical history reviewed she has hypothyroidism, left buttock pressure ulcer dating back to March of this year, iron deficiency, falls, paroxysmal atrial fibrillation anticoagulated and chronic venous insufficiency ABIs in our clinic were 0.86 on the right and 0.96 on the left 11/10; patient comes into clinic today after being admitted last week with uncontrolled lymphedema, severe venous insufficiency and some superficial wounds on her bilateral lower legs. We put her in 3 layer compression and she comes in today with all the wounds closed and considerable improvement in the condition of her lower legs. The patient is going to need lifelong compression stockings probably juxta lites and we are going to go ahead and order those for her today. Unfortunately the POA here is a niece in [place] were probably going to have to talk with her to arrange proper payment for the juxta lite stockings. She is in the [facility] of [facility]. She does not feel she can get the stockings on independently I am hopeful that the nurse and aids at the assisted living can help her with this otherwise this may be a problem. She also has an area on her left buttock which is a stage II wound. A lot of unhealthy irritated skin around this I think from moisture prolonged sittingo Incontinence 11/17; the patient's area on the left buttock is just about closed although there is still a lot of unhealthy looking discolored skin around a large area here. I suspect this is  chronic moisture, pressure, perhaps incontinence. she has minuscule open areas on her bilateral lower legs. Apparently our intermediary did not get the order for juxta lite stockings therefore she does not have any. 12/1; both leg wounds are healed and she has been wearing bilateral juxta lite stockings for 2 days. [facility] are assisting her with this. The left buttock wound unfortunately has deteriorated 12/15; 2-week follow-up. Her legs remain healed she has the other area which is on her left buttock. This is surrounded circumferentially by chronically irritated skin. Mirror image on the right side as well. I suspect this is chronic pressure friction and moisture from incontinence. This makes it difficult to get this area to completely close. We have been using silver alginate 12/29 both her legs remain closed and she has bilateral juxta lite stockings. The real problem here is her left buttock she has a small wound in the mid part of a extremely irritated dermatitis. It almost looks bruised in spots. There is superficial loss of epithelium. The actual wound that we have been dealing with does not look so bad. I have been assuming this is some combination of friction pressure wetness from constant urinary incontinence. If there is a dermatologic issue outside of that here I am not seeing it 09/03/2021; patient with a small area on the left buttock. The real problem here is a marked surrounding erythematous dermatitis with loss of surface epithelium. It looks as though she is beginning to develop satellite lesions this may be mostly fungal 1/26; the area on her left buttock is closed however she still has very irritated macerated skin in this area. Some of this appears fungal. I treated this with ketoconazole last time and I think it looks somewhat better although there is still some area present that still looks like a fungal dermatitis. Everything on the right  buttock looks a lot  better. READMISSION 12/28/2021: The patient returns today with a reopening of a wound on her left buttock. She says it has been present for about 6 months; I am not sure how this is possible since she was seen for similar in January and subsequently discharged from the clinic. Nonetheless, she has some purplish discoloration of her buttock with an open area and some scarring around the perimeter of a shallow stage II ulcer on her buttock. According to the previous clinic notes, there was some question as to whether or not this was due to fungal breakdown of her skin. She currently resides at [facility], in the assisted living section. She says that she cannot see the site and is unable to care for it herself. 01/11/2022: The wound is a little bit smaller today but has a very friable surface. It is an odd wound. On further inspection, it is almost as if there are dilated vessels and the underlying skin as I can compress the surrounding tissue, it dents in, and then fills back up, as if with fluid or blood. She did have an episode during the week when she had a lot of blood come from the wound, but is not actively bleeding today. 01/25/2022: No significant change to the wound. She continues to have purple discoloration of her buttocks, but it does still blanch. 02/08/2022: The wound was measured at slightly larger today, but to my inspection it appears about the same. It is most unusual and certainly not consistent with a typical pressure ulcer. I think it is potentially related to what ever is causing the purplish discoloration of her buttocks. Remains friable and bleeds easily with minimal manipulation. 02/22/2022: No real difference today. The base is clean without any accumulation of slough or hypertrophic granulation tissue. I still think this may be a dermatological issue such as a venous lake or some variant of telangiectasia. 03/15/2022: The wound is unchanged. It bled fairly profusely when her  dressing was removed. She has not yet received an appointment with dermatology. 04/09/2022: The orifice of the wound is smaller but the exposed tissue is still quite friable. She has an appointment with dermatology on August 22. 05/04/2022: She had a biopsy done by dermatology on August 22. We do not have the results. The patient reports she was told it was "staph" and prescribed clindamycin. She took her last capsule yesterday. T oday, the wound is basically unchanged. The surface is a little bit more flush with the surrounding tissue but it remains quite friable. There is some slough accumulation on the surface. Patient History Information obtained from Patient. Family History Cancer - Siblings, Heart Disease - Mother,Father, Hypertension - Father, No family history of Hereditary Spherocytosis, Kidney Disease, Lung Disease, Seizures, Stroke, Thyroid Problems, Tuberculosis. Social History Never smoker, Marital Status - Single, Alcohol Use - Never, Drug Use - No History, Caffeine Use - Rarely. Medical History Eyes Denies history of Cataracts, Glaucoma, Optic Neuritis Ear/Nose/Mouth/Throat Denies history of Chronic sinus problems/congestion, Middle ear problems Hematologic/Lymphatic Denies history of Anemia, Hemophilia, Human Immunodeficiency Virus, Lymphedema, Sickle Cell Disease Respiratory Patient has history of Sleep Apnea - does not use Cpap or 02 as ordered Denies history of Aspiration, Asthma, Chronic Obstructive Pulmonary Disease (COPD), Pneumothorax, Tuberculosis Cardiovascular Patient has history of Arrhythmia - A-Fib, Hypertension, Phlebitis Denies history of Angina, Congestive Heart Failure, Coronary Artery Disease, Deep Vein Thrombosis, Hypotension, Myocardial Infarction, Peripheral Arterial Disease, Peripheral Venous Disease, Vasculitis Gastrointestinal Denies history of Cirrhosis , Colitis,  Crohnoos, Hepatitis A, Hepatitis B, Hepatitis C Endocrine Denies history of Type I  Diabetes, Type II Diabetes Genitourinary Denies history of End Stage Renal Disease Immunological Denies history of Lupus Erythematosus, Raynaudoos, Scleroderma Integumentary (Skin) Denies history of History of Burn Musculoskeletal Denies history of Gout, Rheumatoid Arthritis, Osteoarthritis, Osteomyelitis Neurologic Denies history of Dementia, Neuropathy, Quadriplegia, Paraplegia, Seizure Disorder Oncologic Denies history of Received Chemotherapy, Received Radiation Psychiatric Denies history of Anorexia/bulimia, Confinement Anxiety Medical A Surgical History Notes nd Cardiovascular Hyperlipidemia Endocrine Hypothyroidism Objective Constitutional No acute distress.. Vitals Time Taken: 1:57 PM, Temperature: 98.5 F, Pulse: 67 bpm, Respiratory Rate: 18 breaths/min, Blood Pressure: 129/83 mmHg. Respiratory Normal work of breathing on room air.. General Notes: 05/04/2022: The wound is basically unchanged. The surface is a little bit more flush with the surrounding tissue but it remains quite friable. There is some slough accumulation on the surface. Integumentary (Hair, Skin) Wound #14 status is Open. Original cause of wound was Pressure Injury. The date acquired was: 08/23/2021. The wound has been in [name] 18 weeks. The wound is located on the Left Gluteus. The wound measures 0.9cm length x 0.6cm width x 0.1cm depth; 0.424cm^2 area and 0.042cm^3 volume. There is Fat Layer (Subcutaneous Tissue) exposed. There is no tunneling or undermining noted. There is a small amount of serosanguineous drainage noted. The wound margin is distinct with the outline attached to the wound base. There is large (67-100%) red, friable granulation within the wound bed. There is no necrotic tissue within the wound bed. Assessment Active Problems ICD-10 Pressure ulcer of left buttock, stage 2 Venous insufficiency (chronic) (peripheral) Essential (primary) hypertension Procedures Wound  #14 Pre-procedure diagnosis of Wound #14 is a Pressure Ulcer located on the Left Gluteus . There was a Selective/Open Wound Non-Viable Tissue Debridement with a total area of 0.54 sq cm performed by Katrina Maudlin, MD. With the following instrument(s): Curette to remove Non-Viable tissue/material. Material removed includes [facility] after achieving pain control using Lidocaine 4% T opical Solution. No specimens were taken. A time out was conducted at 14:15, prior to the start of the procedure. A Minimum amount of bleeding was controlled with Silver Nitrate. The procedure was tolerated well with a pain level of 5 throughout and a pain level of 3 following the procedure. Post Debridement Measurements: 0.9cm length x 0.6cm width x 0.2cm depth; 0.085cm^3 volume. Post debridement Stage noted as Category/Stage III. Character of Wound/Ulcer Post Debridement is improved. Post procedure Diagnosis Wound #14: Same as Pre-Procedure General Notes: scribed by Katrina Gouty, RN for Dr. Celine [name]. Plan Follow-up Appointments: Return appointment in 1 month. - Dr. Celine [name] RM 1 Anesthetic: Wound #14 Left Gluteus: (In clinic) Topical Lidocaine 4% applied to wound bed Bathing/ Shower/ Hygiene: May shower and wash wound with soap and water. Off-Loading: Turn and reposition every 2 hours - stand for a few minutes at least every hour during the day WOUND #14: - Gluteus Wound Laterality: Left Cleanser: Soap and Water 1 x Per Day/30 Days Discharge Instructions: May shower and wash wound with dial antibacterial soap and water prior to dressing change. Prim Dressing: KerraCel Ag Gelling Fiber Dressing, 2x2 in (silver alginate) 1 x Per Day/30 Days ary Discharge Instructions: Apply silver alginate to wound bed as instructed Secondary Dressing: ALLEVYN Gentle Border, 4x4 (in/in) 1 x Per Day/30 Days Discharge Instructions: Apply over primary dressing as directed. 05/04/2022: The wound is basically unchanged. The surface is a  little bit more flush with the surrounding tissue but it remains quite friable. There is some  slough accumulation on the surface. I used a curette to debride the slough from the wound surface. This resulted in fairly substantial oozing which was controlled with silver nitrate. We will continue to use silver alginate and a foam border dressing. We will try to get the pathology result from dermatology. Follow-up in 4 weeks. Electronic Signature(s) Signed: 05/04/2022 2:29:21 PM By: Katrina Maudlin MD FACS Entered By: Katrina [name] on 05/04/2022 14:29:21 -------------------------------------------------------------------------------- HxROS Details Patient Name: Date of Service: Katrina Amel D. 05/04/2022 1:15 PM Medical Record Number: [id number] Patient Account Number: [id number] Date of Birth/Sex: Treating RN: 1932-05-23 (88 y.o. Katrina [name] Primary Care Provider: Leanna [name] Other Clinician: Referring Provider: Treating Provider/Extender: Katrina [name]: 18 Information Obtained From Patient Eyes Medical History: Negative for: Cataracts; Glaucoma; Optic Neuritis Ear/Nose/Mouth/Throat Medical History: Negative for: Chronic sinus problems/congestion; Middle ear problems Hematologic/Lymphatic Medical History: Negative for: Anemia; Hemophilia; Human Immunodeficiency Virus; Lymphedema; Sickle Cell Disease Respiratory Medical History: Positive for: Sleep Apnea - does not use Cpap or 02 as ordered Negative for: Aspiration; Asthma; Chronic Obstructive Pulmonary Disease (COPD); Pneumothorax; Tuberculosis Cardiovascular Medical History: Positive for: Arrhythmia - A-Fib; Hypertension; Phlebitis Negative for: Angina; Congestive Heart Failure; Coronary Artery Disease; Deep Vein Thrombosis; Hypotension; Myocardial Infarction; Peripheral Arterial Disease; Peripheral Venous Disease; Vasculitis Past Medical History  Notes: Hyperlipidemia Gastrointestinal Medical History: Negative for: Cirrhosis ; Colitis; Crohns; Hepatitis A; Hepatitis B; Hepatitis C Endocrine Medical History: Negative for: Type I Diabetes; Type II Diabetes Past Medical History Notes: Hypothyroidism Genitourinary Medical History: Negative for: End Stage Renal Disease Immunological Medical History: Negative for: Lupus Erythematosus; Raynauds; Scleroderma Integumentary (Skin) Medical History: Negative for: History of Burn Musculoskeletal Medical History: Negative for: Gout; Rheumatoid Arthritis; Osteoarthritis; Osteomyelitis Neurologic Medical History: Negative for: Dementia; Neuropathy; Quadriplegia; Paraplegia; Seizure Disorder Oncologic Medical History: Negative for: Received Chemotherapy; Received Radiation Psychiatric Medical History: Negative for: Anorexia/bulimia; Confinement Anxiety Immunizations Pneumococcal Vaccine: Received Pneumococcal Vaccination: Yes Received Pneumococcal Vaccination On or After 60th Birthday: No Implantable Devices None Family and Social History Cancer: Yes - Siblings; Heart Disease: Yes - Mother,Father; Hereditary Spherocytosis: No; Hypertension: Yes - Father; Kidney Disease: No; Lung Disease: No; Seizures: No; Stroke: No; Thyroid Problems: No; Tuberculosis: No; Never smoker; Marital Status - Single; Alcohol Use: Never; Drug Use: No History; Caffeine Use: Rarely; Financial Concerns: No; Food, Clothing or Shelter Needs: No; Support System Lacking: No; Transportation Concerns: No Engineer, maintenance) Signed: 05/04/2022 2:32:18 PM By: Katrina Maudlin MD FACS Signed: 05/04/2022 4:55:43 PM By: Katrina [name] Entered By: Katrina [name] on 05/04/2022 14:27:12 -------------------------------------------------------------------------------- SuperBill Details Patient Name: Date of Service: Katrina Amel D. 05/04/2022 Medical Record Number: [id number] Patient Account Number:  [id number] Date of Birth/Sex: Treating RN: Oct 23, 1931 (88 y.o. Katrina [name] Primary Care Provider: Leanna [name] Other Clinician: Referring Provider: Treating Provider/Extender: Katrina [name]: 18 Diagnosis Coding ICD-10 Codes Code Description [phone] Pressure ulcer of left buttock, stage 2 I87.2 Venous insufficiency (chronic) (peripheral) I10 Essential (primary) hypertension Facility Procedures CPT4 Code: [id number] Description: 7597 - DEBRIDE WOUND 1ST 20 SQ CM OR < ICD-10 Diagnosis Description L89.322 Pressure ulcer of left buttock, stage 2 Modifier: Quantity: 1 Physician Procedures : CPT4 Code Description Modifier [id number] - WC PHYS LEVEL 3 - EST PT 25 ICD-10 Diagnosis Description L89.322 Pressure ulcer of left buttock, stage 2 I87.2 Venous insufficiency (chronic) (peripheral) I10 Essential (primary) hypertension Quantity: 1 : [id number] - WC PHYS DEBR WO ANESTH 20 SQ CM ICD-10 Diagnosis Description L89.322  Pressure ulcer of left buttock, stage 2 Quantity: 1 Electronic Signature(s) Signed: 05/04/2022 2:29:35 PM By: Katrina Maudlin MD FACS Entered By: Katrina [name] on 05/04/2022 14:29:35

## 2022-06-01 ENCOUNTER — Encounter (HOSPITAL_BASED_OUTPATIENT_CLINIC_OR_DEPARTMENT_OTHER): Payer: Medicare Other | Attending: General Surgery | Admitting: General Surgery

## 2022-06-01 DIAGNOSIS — L89322 Pressure ulcer of left buttock, stage 2: Secondary | ICD-10-CM | POA: Insufficient documentation

## 2022-06-01 DIAGNOSIS — I10 Essential (primary) hypertension: Secondary | ICD-10-CM | POA: Diagnosis not present

## 2022-06-01 DIAGNOSIS — I872 Venous insufficiency (chronic) (peripheral): Secondary | ICD-10-CM | POA: Diagnosis not present

## 2022-06-04 NOTE — Progress Notes (Signed)
[name] ([id number]) [id number] Page 1 of 7 Visit Report for 06/01/2022 Arrival Information Details Patient Name: Date of Service: Katrina [name], Katrina [name] 06/01/2022 1:15 PM Medical Record Number: [id number] Patient Account Number: [id number] Date of Birth/Sex: Treating RN: 11/01/1931 (88 y.o. Iver Nestle, [place] Primary Care Jeevan Kalla: [name] Other Clinician: Referring Shermika Balthaser: Treating Jaleel Allen/Extender: Kathlyn [place] in Treatment: 31 Visit Information History Since Last Visit All ordered tests and consults were completed: Yes Patient Arrived: Ambulatory Added or deleted any medications: No Arrival Time: 13:19 Any new allergies or adverse reactions: No Accompanied By: self Had a fall or experienced change in No Transfer Assistance: None activities of daily living that may affect Patient Requires Transmission-Based Precautions: No risk of falls: Patient Has Alerts: No Signs or symptoms of abuse/neglect since last visito No Hospitalized since last visit: No Implantable device outside of the clinic excluding No cellular tissue based products placed in the center since last visit: Has Dressing in Place as Prescribed: Yes Pain Present Now: Yes Electronic Signature(s) Signed: 06/04/2022 5:15:19 PM By: Blanche East RN Entered By: Blanche East on 06/01/2022 13:28:42 -------------------------------------------------------------------------------- Encounter Discharge Information Details Patient Name: Date of Service: Katrina Amel [name]. 06/01/2022 1:15 PM Medical Record Number: [id number] Patient Account Number: [id number] Date of Birth/Sex: Treating RN: 12/31/1931 (88 y.o. Marta Lamas Primary Care Malacki Mcphearson: [name] Other Clinician: Referring Nathasha Fiorillo: Treating Kysha Muralles/Extender: Kathlyn [place] in Treatment: 22 Encounter Discharge Information Items Discharge Condition: Stable Ambulatory  Status: Ambulatory Discharge Destination: Home Transportation: Other Accompanied By: self Schedule Follow-up Appointment: Yes Clinical Summary of Care: Electronic Signature(s) Signed: 06/04/2022 5:15:19 PM By: Blanche East RN Entered By: Blanche East on 06/01/2022 13:44:55 Ramirez, Katrina [name] ([id number]) [id number] Page 2 of 7 -------------------------------------------------------------------------------- Lower Extremity Assessment Details Patient Name: Date of Service: Katrina [name], Katrina [name] 06/01/2022 1:15 PM Medical Record Number: [id number] Patient Account Number: [id number] Date of Birth/Sex: Treating RN: 01-10-1932 (88 y.o. Marta Lamas Primary Care Lille Karim: [name] Other Clinician: Referring Ema Hebner: Treating Breella Vanostrand/Extender: Kathlyn [place] in Treatment: 22 Electronic Signature(s) Signed: 06/04/2022 5:15:19 PM By: Blanche East RN Entered By: Blanche East on 06/01/2022 13:29:19 -------------------------------------------------------------------------------- Multi Wound Chart Details Patient Name: Date of Service: Katrina Amel [name]. 06/01/2022 1:15 PM Medical Record Number: [id number] Patient Account Number: [id number] Date of Birth/Sex: Treating RN: [birth year] (88 y.o. [name] Primary Care Olman Yono: [name] Other Clinician: Referring Sanii Kukla: Treating Navjot Pilgrim/Extender: Kathlyn [place] in Treatment: 22 Vital Signs Height(in): Pulse(bpm): 72 Weight(lbs): Blood Pressure(mmHg): 125/75 Body Mass Index(BMI): Temperature(F): 98.5 Respiratory Rate(breaths/min): 18 [14:Photos:] [N/A:N/A] Left Gluteus N/A N/A Wound Location: Pressure Injury N/A N/A Wounding Event: Pressure Ulcer N/A N/A Primary Etiology: Sleep Apnea, Arrhythmia, N/A N/A Comorbid History: Hypertension, Phlebitis 08/23/2021 N/A N/A Date Acquired: 22 N/A N/A Weeks of Treatment: Open N/A N/A Wound  Status: No N/A N/A Wound Recurrence: 0.5x0.2x0.2 N/A N/A Measurements L x W x [name] (cm) 0.079 N/A N/A A (cm) : rea 0.016 N/A N/A Volume (cm) : 93.70% N/A N/A % Reduction in A rea: 93.60% N/A N/A % Reduction in Volume: Category/Stage III N/A N/A Classification: Medium N/A N/A Exudate A mount: Sanguinous N/A N/A Exudate Type: red N/A N/A Exudate Color: Distinct, outline attached N/A N/A Wound Margin: Large (67-100%) N/A N/A Granulation A mount: Red, Friable N/A N/A Granulation Quality: None Present (0%) N/A N/A Necrotic A mount: [place], Katrina [name] ([id number]) [id number] Page 3 of 7 Fat Layer (Subcutaneous Tissue): Yes N/A N/A Exposed  Structures: Fascia: No Tendon: No Muscle: No Joint: No Bone: No Small (1-33%) N/A N/A Epithelialization: Dry/Scaly: Yes N/A N/A Periwound Skin Moisture: Atrophie Blanche: No N/A N/A Periwound Skin Color: Cyanosis: No Ecchymosis: No Erythema: No Hemosiderin Staining: No Mottled: No Pallor: No Rubor: No Chemical Cauterization N/A N/A Procedures Performed: Treatment Notes Wound #14 (Gluteus) Wound Laterality: Left Cleanser Soap and Water Discharge Instruction: May shower and wash wound with dial antibacterial soap and water prior to dressing change. Peri-Wound Care Topical Primary Dressing KerraCel Ag Gelling Fiber Dressing, 2x2 in (silver alginate) Discharge Instruction: Apply silver alginate to wound bed as instructed Secondary Dressing ALLEVYN Gentle Border, 4x4 (in/in) Discharge Instruction: Apply over primary dressing as directed. Secured With Compression Wrap Compression Stockings Environmental education officer) Signed: 06/01/2022 1:46:22 PM By: Fredirick Maudlin MD FACS Signed: 06/02/2022 5:37:12 PM By: Baruch Gouty RN, BSN Entered By: Fredirick Maudlin on 06/01/2022 13:46:22 -------------------------------------------------------------------------------- Multi-Disciplinary Care Plan  Details Patient Name: Date of Service: Katrina Amel [name]. 06/01/2022 1:15 PM Medical Record Number: [id number] Patient Account Number: [id number] Date of Birth/Sex: Treating RN: 1931/11/28 (88 y.o. Marta Lamas Primary Care Katrinia Straker: [name] Other Clinician: Referring Tarra Pence: Treating Shazia Mitchener/Extender: Kathlyn [place] in Treatment: [place] reviewed with physician Active Inactive Wound/Skin Impairment Nursing Diagnoses: Impaired tissue integrity [place], Katrina Chamber ([id number]) [id number] Page 4 of 7 Knowledge deficit related to ulceration/compromised skin integrity Goals: Patient/caregiver will verbalize understanding of skin care regimen Date Initiated: 12/28/2021 Target Resolution Date: 06/02/2022 Goal Status: Active Ulcer/skin breakdown will have a volume reduction of 30% by week 4 Date Initiated: 12/28/2021 Date Inactivated: 01/25/2022 Target Resolution Date: 01/25/2022 Goal Status: Met Ulcer/skin breakdown will have a volume reduction of 50% by week 8 Date Initiated: 01/25/2022 Date Inactivated: 02/22/2022 Target Resolution Date: 02/22/2022 Unmet Reason: atypical wound, derm Goal Status: Unmet consult Interventions: Assess patient/caregiver ability to obtain necessary supplies Assess patient/caregiver ability to perform ulcer/skin care regimen upon admission and as needed Assess ulceration(s) every visit Provide education on ulcer and skin care Treatment Activities: Skin care regimen initiated : 12/28/2021 Topical wound management initiated : 12/28/2021 Notes: Electronic Signature(s) Signed: 06/04/2022 5:15:19 PM By: Blanche East RN Entered By: Blanche East on 06/01/2022 13:35:28 -------------------------------------------------------------------------------- Pain Assessment Details Patient Name: Date of Service: Katrina Amel [name]. 06/01/2022 1:15 PM Medical Record Number: [id number] Patient Account Number:  [id number] Date of Birth/Sex: Treating RN: 20-Oct-1931 (88 y.o. Marta Lamas Primary Care Annemarie Sebree: [name] Other Clinician: Referring Analyah Mcconnon: Treating Kamden Stanislaw/Extender: Kathlyn [place] in Treatment: 22 Active Problems Location of Pain Severity and Description of Pain Patient Has Paino Yes Site Locations Rate the pain. Current Pain Level: 3 Pain Management and Medication Current Pain Management: Electronic Signature(s) Crate, Katrina Chamber ([id number]) [id number] Page 5 of 7 Signed: 06/04/2022 5:15:19 PM By: Blanche East RN Entered By: Blanche East on 06/01/2022 13:29:12 -------------------------------------------------------------------------------- Patient/Caregiver Education Details Patient Name: Date of Service: Katrina [name] 10/10/2023andnbsp1:15 PM Medical Record Number: [id number] Patient Account Number: [id number] Date of Birth/Gender: Treating RN: 1931-08-26 (88 y.o. Marta Lamas Primary Care Physician: [name] Other Clinician: Referring Physician: Treating Physician/Extender: Kathlyn [place] in Treatment: 22 Education Assessment Education Provided To: Patient Education Topics Provided Wound/Skin Impairment: Methods: Explain/Verbal Responses: Reinforcements needed, State content correctly [employer]) Signed: 06/04/2022 5:15:19 PM By: Blanche East RN Entered By: Blanche East on 06/01/2022 13:35:45 -------------------------------------------------------------------------------- Wound Assessment Details Patient Name: Date of Service: Katrina Amel [name]. 06/01/2022 1:15 PM Medical Record Number: [id number]  Patient Account Number: [id number] Date of Birth/Sex: Treating RN: Sep 17, 1931 (88 y.o. Marta Lamas Primary Care Tyrice Hewitt: [name] Other Clinician: Referring Bryce Kimble: Treating Cristy Colmenares/Extender: Kathlyn [place] in  Treatment: 22 Wound Status Wound Number: 14 Primary Etiology: Pressure Ulcer Wound Location: Left Gluteus Wound Status: Open Wounding Event: Pressure Injury Comorbid History: Sleep Apnea, Arrhythmia, Hypertension, Phlebitis Date Acquired: 08/23/2021 Weeks Of Treatment: 22 Clustered Wound: No Photos Sestak, Tamani [name] ([id number]) [id number] Page 6 of 7 Wound Measurements Length: (cm) 0.5 Width: (cm) 0.2 Depth: (cm) 0.2 Area: (cm) 0.079 Volume: (cm) 0.016 % Reduction in Area: 93.7% % Reduction in Volume: 93.6% Epithelialization: Small (1-33%) Wound Description Classification: Category/Stage III Wound Margin: Distinct, outline attached Exudate Amount: Medium Exudate Type: Sanguinous Exudate Color: red Foul Odor After Cleansing: No Slough/Fibrino No Wound Bed Granulation Amount: Large (67-100%) Exposed Structure Granulation Quality: Red, Friable Fascia Exposed: No Necrotic Amount: None Present (0%) Fat Layer (Subcutaneous Tissue) Exposed: Yes Tendon Exposed: No Muscle Exposed: No Joint Exposed: No Bone Exposed: No Periwound Skin Texture Texture Color No Abnormalities Noted: No No Abnormalities Noted: No Atrophie Blanche: No Moisture Cyanosis: No No Abnormalities Noted: No Ecchymosis: No Dry / Scaly: Yes Erythema: No Hemosiderin Staining: No Mottled: No Pallor: No Rubor: No Treatment Notes Wound #14 (Gluteus) Wound Laterality: Left Cleanser Soap and Water Discharge Instruction: May shower and wash wound with dial antibacterial soap and water prior to dressing change. Peri-Wound Care Topical Primary Dressing KerraCel Ag Gelling Fiber Dressing, 2x2 in (silver alginate) Discharge Instruction: Apply silver alginate to wound bed as instructed Secondary Dressing ALLEVYN Gentle Border, 4x4 (in/in) Discharge Instruction: Apply over primary dressing as directed. Secured With Compression Wrap Compression Stockings Sport and exercise psychologist) Signed: 06/04/2022 5:15:19 PM By: Blanche East RN Entered By: Blanche East on 06/01/2022 13:34:51 Dini, Katrina [name] ([id number]) [id number] Page 7 of 7 -------------------------------------------------------------------------------- Vitals Details Patient Name: Date of Service: MAKINLEY, [place]CATO 06/01/2022 1:15 PM Medical Record Number: [id number] Patient Account Number: [id number] Date of Birth/Sex: Treating RN: 1932/04/09 (88 y.o. Marta Lamas Primary Care Grae Cannata: [name] Other Clinician: Referring Jonel Sick: Treating Sharon Rubis/Extender: Kathlyn [place] in Treatment: 22 Vital Signs Time Taken: 13:20 Temperature (F): 98.5 Pulse (bpm): 72 Respiratory Rate (breaths/min): 18 Blood Pressure (mmHg): 125/75 Reference Range: 80 - 120 mg / dl Electronic Signature(s) Signed: 06/04/2022 5:15:19 PM By: Blanche East RN Entered By: Blanche East on 06/01/2022 13:29:06

## 2022-06-04 NOTE — Progress Notes (Signed)
SABRIYAH, WILCHER ([id number]) [id number] Page 1 of 10 Visit Report for 06/01/2022 Chief Complaint Document Details Patient Name: Date of Service: Katrina, [name]. 06/01/2022 1:15 PM Medical Record Number: [id number] Patient Account Number: [id number] Date of Birth/Sex: Treating RN: March 28, 1932 (88 y.o. Katrina [name] Primary Care Provider: [name] Other Clinician: Referring Provider: Treating Provider/Extender: Kathlyn Sacramento in Treatment: 22 Information Obtained from: Patient Chief Complaint Patient returns to the wound care center for reopened ulcer to: Left buttock Electronic Signature(s) Signed: 06/01/2022 1:47:00 PM By: Fredirick Maudlin MD FACS Entered By: Fredirick Maudlin on 06/01/2022 13:47:00 -------------------------------------------------------------------------------- HPI Details Patient Name: Date of Service: Katrina Amel [name]. 06/01/2022 1:15 PM Medical Record Number: [id number] Patient Account Number: [id number] Date of Birth/Sex: Treating RN: 1932/01/01 (88 y.o. Katrina [name] Primary Care Provider: [name] Other Clinician: Referring Provider: Treating Provider/Extender: Kathlyn Sacramento in Treatment: 22 History of Present Illness Location: right lower extremity Quality: Patient reports experiencing a dull pain to affected area(s). Severity: Patient states wound(s) are getting better. Duration: Patient has had the wound for 3 months prior to presenting for treatment Context: The wound would happen gradually Modifying Factors: Patient wound(s)/ulcer(s) are worsening due to :standing for a long while. HPI Description: Past medical history of pulmonary emboli, hypothyroidism, hyperlipidemia, hypertension, sleep apnea, gallstones, ulcerated lower extremities, atrial fibrillation, status post total knee replacement, cholecystectomy, cystoscopy and stent placement for kidney  stones. She has never been a smoker. About a year ago she was seen by [name] with an ulcer of her right lower extremity which she's had for about over 3 months. On 10/23/14 -- Lower extremity venous duplex evaluation. She had no evidence of deep venous thromboses involving both lower extremities. There was evidence of deep vein reflux in the right and left common femoral veins. She also had evidence of great saphenous vein reflux noted only at the level of the saphenofemoral junction. No evidence of greater or small saphenous vein reflux. She will need a consult to the vascular surgeon. Seen by Dr. Mathis Fare office note dated 11/13/2014. He noted that she had good arterial blood flow with palpable DP pulses bilaterally. Based on the patient's history Dr. Scot Dock recommended elevation when at rest of both feet and knees and ankles above heart level in the supine position. Activity such as walking and pool exercises were recommended. She will also have 15-20 mm thigh-high compression stockings daily once the ulcers have completely healed. A prescription was given to her. 11/04/2015 -- the wound on her right lower extremity has completely healed and her edema has gone down significantly. However in her popliteal fossa where she's got a lot of fungal infection there is an area which is now an open wound and this will need attention. 12/02/2015 -- the right lower extremity looks much better than the edema has gone down significantly however she continues to have significant edema on the left side today. 12/09/2015 -- the patient has been doing her dressings as advised and says that overall she feels much better. She has received her juxta light for the left lower extremity READMISSION 06/25/2021 This is a patient who is actually had several previous visits to our clinic in 2014, 2016 and more recently 2017. This was largely because of wounds on her lower legs in the setting of chronic venous insufficiency and  secondary lymphedema. Looking at my records she was apparently prescribed thigh-high Naef, Katrina [name] ([id number]) [id number] Page 2 of 10 compression stockings probably by  vein and vascular although she comes back in not having worn any stockings in quite a period of time. She is now in the assisted living part of [facility]. She thinks her wounds have been there since April. This includes the left lateral, right anterior and right posterior lateral parts of both lower legs. She also has a stage II pressure ulcer on her left glued I think which is happened because of sleeping in a lift chair. She had ABDs and kerlix on her bilateral lower legs. Past medical history reviewed she has hypothyroidism, left buttock pressure ulcer dating back to March of this year, iron deficiency, falls, paroxysmal atrial fibrillation anticoagulated and chronic venous insufficiency ABIs in our clinic were 0.86 on the right and 0.96 on the left 11/10; patient comes into clinic today after being admitted last week with uncontrolled lymphedema, severe venous insufficiency and some superficial wounds on her bilateral lower legs. We put her in 3 layer compression and she comes in today with all the wounds closed and considerable improvement in the condition of her lower legs. The patient is going to need lifelong compression stockings probably juxta lites and we are going to go ahead and order those for her today. Unfortunately the POA here is a niece in [place] were probably going to have to talk with her to arrange proper payment for the juxta lite stockings. She is in the assisted living level of [facility]. She does not feel she can get the stockings on independently I am hopeful that the nurse and aids at the assisted living can help her with this otherwise this may be a problem. She also has an area on her left buttock which is a stage II wound. A  lot of unhealthy irritated skin around this I think from moisture prolonged sittingo Incontinence 11/17; the patient's area on the left buttock is just about closed although there is still a lot of unhealthy looking discolored skin around a large area here. I suspect this is chronic moisture, pressure, perhaps incontinence. she has minuscule open areas on her bilateral lower legs. Apparently our intermediary did not get the order for juxta lite stockings therefore she does not have any. 12/1; both leg wounds are healed and she has been wearing bilateral juxta lite stockings for 2 days. [facility] are assisting her with this. The left buttock wound unfortunately has deteriorated 12/15; 2-week follow-up. Her legs remain healed she has the other area which is on her left buttock. This is surrounded circumferentially by chronically irritated skin. Mirror image on the right side as well. I suspect this is chronic pressure friction and moisture from incontinence. This makes it difficult to get this area to completely close. We have been using silver alginate 12/29 both her legs remain closed and she has bilateral juxta lite stockings. The real problem here is her left buttock she has a small wound in the mid part of a extremely irritated dermatitis. It almost looks bruised in spots. There is superficial loss of epithelium. The actual wound that we have been dealing with does not look so bad. I have been assuming this is some combination of friction pressure wetness from constant urinary incontinence. If there is a dermatologic issue outside of that here I am not seeing it 09/03/2021; patient with a small area on the left buttock. The real problem here is a marked surrounding erythematous dermatitis with loss of surface epithelium. It looks as though she  is beginning to develop satellite lesions this may be mostly fungal 1/26; the area on her left buttock is closed however she still has very  irritated macerated skin in this area. Some of this appears fungal. I treated this with ketoconazole last time and I think it looks somewhat better although there is still some area present that still looks like a fungal dermatitis. Everything on the right buttock looks a lot better. READMISSION 12/28/2021: The patient returns today with a reopening of a wound on her left buttock. She says it has been present for about 6 months; I am not sure how this is possible since she was seen for similar in January and subsequently discharged from the clinic. Nonetheless, she has some purplish discoloration of her buttock with an open area and some scarring around the perimeter of a shallow stage II ulcer on her buttock. According to the previous clinic notes, there was some question as to whether or not this was due to fungal breakdown of her skin. She currently resides at [facility], in the assisted living section. She says that she cannot see the site and is unable to care for it herself. 01/11/2022: The wound is a little bit smaller today but has a very friable surface. It is an odd wound. On further inspection, it is almost as if there are dilated vessels and the underlying skin as I can compress the surrounding tissue, it dents in, and then fills back up, as if with fluid or blood. She did have an episode during the week when she had a lot of blood come from the wound, but is not actively bleeding today. 01/25/2022: No significant change to the wound. She continues to have purple discoloration of her buttocks, but it does still blanch. 02/08/2022: The wound was measured at slightly larger today, but to my inspection it appears about the same. It is most unusual and certainly not consistent with a typical pressure ulcer. I think it is potentially related to what ever is causing the purplish discoloration of her buttocks. Remains friable and bleeds easily with minimal manipulation. 02/22/2022: No real difference  today. The base is clean without any accumulation of slough or hypertrophic granulation tissue. I still think this may be a dermatological issue such as a venous lake or some variant of telangiectasia. 03/15/2022: The wound is unchanged. It bled fairly profusely when her dressing was removed. She has not yet received an appointment with dermatology. 04/09/2022: The orifice of the wound is smaller but the exposed tissue is still quite friable. She has an appointment with dermatology on August 22. 05/04/2022: She had a biopsy done by dermatology on August 22. We do not have the results. The patient reports she was told it was "staph" and prescribed clindamycin. She took her last capsule yesterday. T oday, the wound is basically unchanged. The surface is a little bit more flush with the surrounding tissue but it remains quite friable. There is some slough accumulation on the surface. 06/01/2022: The opening in the wound has contracted considerably. The tissues are still extremely friable and bleed with minimal manipulation. Electronic Signature(s) Signed: 06/01/2022 1:47:33 PM By: Fredirick Maudlin MD FACS Entered By: Fredirick Maudlin on 06/01/2022 13:47:33 -------------------------------------------------------------------------------- Chemical Cauterization Details Patient Name: Date of Service: Katrina Amel [name]. 06/01/2022 1:15 PM Medical Record Number: [id number] Patient Account Number: [id number] Date of Birth/Sex: Treating RN: Dec 29, 1931 (88 y.o. Katrina [name] Primary Care Provider: [name] Other Clinician: [name] ([id number]) [id number] Page 3  of 10 Referring Provider: Treating Provider/Extender: Kathlyn Sacramento in Treatment: 22 Procedure Performed for: Wound #14 Left Gluteus Performed By: Physician Fredirick Maudlin, MD Post Procedure Diagnosis Same as Pre-procedure Notes Scribed for Dr. Celine Ahr by Blanche East,  RN Electronic Signature(s) Signed: 06/01/2022 1:54:01 PM By: Fredirick Maudlin MD FACS Signed: 06/04/2022 5:15:19 PM By: Blanche East RN Entered By: Blanche East on 06/01/2022 13:43:20 -------------------------------------------------------------------------------- Physical Exam Details Patient Name: Date of Service: Katrina Amel [name]. 06/01/2022 1:15 PM Medical Record Number: [id number] Patient Account Number: [id number] Date of Birth/Sex: Treating RN: [birth year] (88 y.o. Katrina [name] Primary Care Provider: [name] Other Clinician: Referring Provider: Treating Provider/Extender: Kathlyn Sacramento in Treatment: 22 Constitutional . . . . No acute distress.Marland Kitchen Respiratory Normal work of breathing on room air.. Notes 06/01/2022: The opening in the wound has contracted considerably. The tissues are still extremely friable and bleed with minimal manipulation. Electronic Signature(s) Signed: 06/01/2022 1:47:57 PM By: Fredirick Maudlin MD FACS Entered By: Fredirick Maudlin on 06/01/2022 13:47:57 -------------------------------------------------------------------------------- Physician Orders Details Patient Name: Date of Service: Katrina Amel [name]. 06/01/2022 1:15 PM Medical Record Number: [id number] Patient Account Number: [id number] Date of Birth/Sex: Treating RN: 12-Apr-1932 (88 y.o. Katrina [name] Primary Care Provider: [name] Other Clinician: Referring Provider: Treating Provider/Extender: Kathlyn Sacramento in Treatment: 68 Verbal / Phone Orders: No Diagnosis Coding ICD-10 Coding Code Description [phone] Pressure ulcer of left buttock, stage 2 I87.2 Venous insufficiency (chronic) (peripheral) I10 Essential (primary) hypertension Navis, Katrina [name] ([id number]) [id number] Page 4 of 10 Follow-up Appointments Return appointment in 1 month. - Dr. Celine Ahr RM 1 Anesthetic Wound #14 Left Gluteus (In  clinic) Topical Lidocaine 4% applied to wound bed Bathing/ Shower/ Hygiene May shower and wash wound with soap and water. Off-Loading Turn and reposition every 2 hours - stand for a few minutes at least every hour during the day Wound Treatment Wound #14 - Gluteus Wound Laterality: Left Cleanser: Soap and Water 1 x Per Day/30 Days Discharge Instructions: May shower and wash wound with dial antibacterial soap and water prior to dressing change. Prim Dressing: KerraCel Ag Gelling Fiber Dressing, 2x2 in (silver alginate) 1 x Per Day/30 Days ary Discharge Instructions: Apply silver alginate to wound bed as instructed Secondary Dressing: ALLEVYN Gentle Border, 4x4 (in/in) 1 x Per Day/30 Days Discharge Instructions: Apply over primary dressing as directed. Electronic Signature(s) Signed: 06/01/2022 1:54:01 PM By: Fredirick Maudlin MD FACS Entered By: Fredirick Maudlin on 06/01/2022 13:51:23 -------------------------------------------------------------------------------- Problem List Details Patient Name: Date of Service: Katrina Amel [name]. 06/01/2022 1:15 PM Medical Record Number: [id number] Patient Account Number: [id number] Date of Birth/Sex: Treating RN: May 04, 1932 (88 y.o. Katrina [name] Primary Care Provider: [name] Other Clinician: Referring Provider: Treating Provider/Extender: Kathlyn Sacramento in Treatment: 22 Active Problems ICD-10 Encounter Code Description Active Date MDM Diagnosis [phone] Pressure ulcer of left buttock, stage 2 12/28/2021 No Yes I87.2 Venous insufficiency (chronic) (peripheral) 12/28/2021 No Yes I10 Essential (primary) hypertension 12/28/2021 No Yes Inactive Problems Resolved Problems Electronic Signature(s) Signed: 06/01/2022 1:46:16 PM By: Fredirick Maudlin MD FACS Walder, Darryll Capers [name] ([id number]) [id number] Page 5 of 10 Entered By: Fredirick Maudlin on 06/01/2022  13:46:16 -------------------------------------------------------------------------------- Progress Note Details Patient Name: Date of Service: Katrina [name], Katrina [name] 06/01/2022 1:15 PM Medical Record Number: [id number] Patient Account Number: [id number] Date of Birth/Sex: Treating RN: 12-08-1931 (88 y.o. Katrina [name] Primary Care Provider: [name] Other Clinician: Referring Provider: Treating Provider/Extender: Katrina [name]  Weeks in Treatment: 22 Subjective Chief Complaint Information obtained from Patient Patient returns to the wound care center for reopened ulcer to: Left buttock History of Present Illness (HPI) The following HPI elements were documented for the patient's wound: Location: right lower extremity Quality: Patient reports experiencing a dull pain to affected area(s). Severity: Patient states wound(s) are getting better. Duration: Patient has had the wound for 3 months prior to presenting for treatment Context: The wound would happen gradually Modifying Factors: Patient wound(s)/ulcer(s) are worsening due to :standing for a long while. Past medical history of pulmonary emboli, hypothyroidism, hyperlipidemia, hypertension, sleep apnea, gallstones, ulcerated lower extremities, atrial fibrillation, status post total knee replacement, cholecystectomy, cystoscopy and stent placement for kidney stones. She has never been a smoker. About a year ago she was seen by [name] with an ulcer of her right lower extremity which she's had for about over 3 months. On 10/23/14 -- Lower extremity venous duplex evaluation. She had no evidence of deep venous thromboses involving both lower extremities. There was evidence of deep vein reflux in the right and left common femoral veins. She also had evidence of great saphenous vein reflux noted only at the level of the saphenofemoral junction. No evidence of greater or small saphenous vein reflux. She will need a consult to  the vascular surgeon. Seen by Dr. Mathis Fare office note dated 11/13/2014. He noted that she had good arterial blood flow with palpable DP pulses bilaterally. Based on the patient's history Dr. Scot Dock recommended elevation when at rest of both feet and knees and ankles above heart level in the supine position. Activity such as walking and pool exercises were recommended. She will also have 15-20 mm thigh-high compression stockings daily once the ulcers have completely healed. A prescription was given to her. 11/04/2015 -- the wound on her right lower extremity has completely healed and her edema has gone down significantly. However in her popliteal fossa where she's got a lot of fungal infection there is an area which is now an open wound and this will need attention. 12/02/2015 -- the right lower extremity looks much better than the edema has gone down significantly however she continues to have significant edema on the left side today. 12/09/2015 -- the patient has been doing her dressings as advised and says that overall she feels much better. She has received her juxta light for the left lower extremity READMISSION 06/25/2021 This is a patient who is actually had several previous visits to our clinic in 2014, 2016 and more recently 2017. This was largely because of wounds on her lower legs in the setting of chronic venous insufficiency and secondary lymphedema. Looking at my records she was apparently prescribed thigh-high compression stockings probably by vein and vascular although she comes back in not having worn any stockings in quite a period of time. She is now in the assisted living part of [facility]. She thinks her wounds have been there since April. This includes the left lateral, right anterior and right posterior lateral parts of both lower legs. She also has a stage II pressure ulcer on her left glued I think which is happened because of sleeping in a  lift chair. She had ABDs and kerlix on her bilateral lower legs. Past medical history reviewed she has hypothyroidism, left buttock pressure ulcer dating back to March of this year, iron deficiency, falls, paroxysmal atrial fibrillation anticoagulated and chronic venous insufficiency ABIs in our clinic were 0.86 on the right and 0.96 on  the left 11/10; patient comes into clinic today after being admitted last week with uncontrolled lymphedema, severe venous insufficiency and some superficial wounds on her bilateral lower legs. We put her in 3 layer compression and she comes in today with all the wounds closed and considerable improvement in the condition of her lower legs. The patient is going to need lifelong compression stockings probably juxta lites and we are going to go ahead and order those for her today. Unfortunately the POA here is a niece in [place] were probably going to have to talk with her to arrange proper payment for the juxta lite stockings. She is in the assisted living level of [facility]. She does not feel she can get the stockings on independently I am hopeful that the nurse and aids at the assisted living can help her with this otherwise this may be a problem. She also has an area on her left buttock which is a stage II wound. A lot of unhealthy irritated skin around this I think from moisture prolonged sittingo Incontinence 11/17; the patient's area on the left buttock is just about closed although there is still a lot of unhealthy looking discolored skin around a large area here. I suspect this is chronic moisture, pressure, perhaps incontinence. she has minuscule open areas on her bilateral lower legs. Apparently our intermediary did not get the order for juxta lite stockings therefore she does not have any. 12/1; both leg wounds are healed and she has been wearing bilateral juxta lite stockings for 2 days. [facility] are assisting her with this.  The left buttock wound unfortunately has deteriorated 12/15; 2-week follow-up. Her legs remain healed she has the other area which is on her left buttock. This is surrounded circumferentially by chronically irritated skin. Mirror image on the right side as well. I suspect this is chronic pressure friction and moisture from incontinence. This makes it difficult to get this area to completely close. We have been using silver alginate Katrina [name], Katrina [name] ([id number]) [id number] Page 6 of 10 12/29 both her legs remain closed and she has bilateral juxta lite stockings. The real problem here is her left buttock she has a small wound in the mid part of a extremely irritated dermatitis. It almost looks bruised in spots. There is superficial loss of epithelium. The actual wound that we have been dealing with does not look so bad. I have been assuming this is some combination of friction pressure wetness from constant urinary incontinence. If there is a dermatologic issue outside of that here I am not seeing it 09/03/2021; patient with a small area on the left buttock. The real problem here is a marked surrounding erythematous dermatitis with loss of surface epithelium. It looks as though she is beginning to develop satellite lesions this may be mostly fungal 1/26; the area on her left buttock is closed however she still has very irritated macerated skin in this area. Some of this appears fungal. I treated this with ketoconazole last time and I think it looks somewhat better although there is still some area present that still looks like a fungal dermatitis. Everything on the right buttock looks a lot better. READMISSION 12/28/2021: The patient returns today with a reopening of a wound on her left buttock. She says it has been present for about 6 months; I am not sure how this is possible since she was seen for similar in January and subsequently discharged from the clinic. Nonetheless, she  has some purplish discoloration of her buttock with an open area and some scarring around the perimeter of a shallow stage II ulcer on her buttock. According to the previous clinic notes, there was some question as to whether or not this was due to fungal breakdown of her skin. She currently resides at [facility], in the assisted living section. She says that she cannot see the site and is unable to care for it herself. 01/11/2022: The wound is a little bit smaller today but has a very friable surface. It is an odd wound. On further inspection, it is almost as if there are dilated vessels and the underlying skin as I can compress the surrounding tissue, it dents in, and then fills back up, as if with fluid or blood. She did have an episode during the week when she had a lot of blood come from the wound, but is not actively bleeding today. 01/25/2022: No significant change to the wound. She continues to have purple discoloration of her buttocks, but it does still blanch. 02/08/2022: The wound was measured at slightly larger today, but to my inspection it appears about the same. It is most unusual and certainly not consistent with a typical pressure ulcer. I think it is potentially related to what ever is causing the purplish discoloration of her buttocks. Remains friable and bleeds easily with minimal manipulation. 02/22/2022: No real difference today. The base is clean without any accumulation of slough or hypertrophic granulation tissue. I still think this may be a dermatological issue such as a venous lake or some variant of telangiectasia. 03/15/2022: The wound is unchanged. It bled fairly profusely when her dressing was removed. She has not yet received an appointment with dermatology. 04/09/2022: The orifice of the wound is smaller but the exposed tissue is still quite friable. She has an appointment with dermatology on August 22. 05/04/2022: She had a biopsy done by dermatology on August 22. We do not  have the results. The patient reports she was told it was "staph" and prescribed clindamycin. She took her last capsule yesterday. T oday, the wound is basically unchanged. The surface is a little bit more flush with the surrounding tissue but it remains quite friable. There is some slough accumulation on the surface. 06/01/2022: The opening in the wound has contracted considerably. The tissues are still extremely friable and bleed with minimal manipulation. Patient History Information obtained from Patient. Family History Cancer - Siblings, Heart Disease - Mother,Father, Hypertension - Father, No family history of Hereditary Spherocytosis, Kidney Disease, Lung Disease, Seizures, Stroke, Thyroid Problems, Tuberculosis. Social History Never smoker, Marital Status - Single, Alcohol Use - Never, Drug Use - No History, Caffeine Use - Rarely. Medical History Eyes Denies history of Cataracts, Glaucoma, Optic Neuritis Ear/Nose/Mouth/Throat Denies history of Chronic sinus problems/congestion, Middle ear problems Hematologic/Lymphatic Denies history of Anemia, Hemophilia, Human Immunodeficiency Virus, Lymphedema, Sickle Cell Disease Respiratory Patient has history of Sleep Apnea - does not use Cpap or 02 as ordered Denies history of Aspiration, Asthma, Chronic Obstructive Pulmonary Disease (COPD), Pneumothorax, Tuberculosis Cardiovascular Patient has history of Arrhythmia - A-Fib, Hypertension, Phlebitis Denies history of Angina, Congestive Heart Failure, Coronary Artery Disease, Deep Vein Thrombosis, Hypotension, Myocardial Infarction, Peripheral Arterial Disease, Peripheral Venous Disease, Vasculitis Gastrointestinal Denies history of Cirrhosis , Colitis, Crohnoos, Hepatitis A, Hepatitis B, Hepatitis C Endocrine Denies history of Type I Diabetes, Type II Diabetes Genitourinary Denies history of End Stage Renal Disease Immunological Denies history of Lupus Erythematosus, Raynaudoos,  Scleroderma Integumentary (Skin)  Denies history of History of Burn Musculoskeletal Denies history of Gout, Rheumatoid Arthritis, Osteoarthritis, Osteomyelitis Neurologic Denies history of Dementia, Neuropathy, Quadriplegia, Paraplegia, Seizure Disorder Oncologic Denies history of Received Chemotherapy, Received Radiation Psychiatric Denies history of Anorexia/bulimia, Confinement Anxiety Medical A Surgical History Notes nd Cardiovascular Hyperlipidemia Endocrine Hypothyroidism Katrina [name], Katrina [name] ([id number]) [id number] Page 7 of 10 Objective Constitutional No acute distress.. Vitals Time Taken: 1:20 PM, Temperature: 98.5 F, Pulse: 72 bpm, Respiratory Rate: 18 breaths/min, Blood Pressure: 125/75 mmHg. Respiratory Normal work of breathing on room air.. General Notes: 06/01/2022: The opening in the wound has contracted considerably. The tissues are still extremely friable and bleed with minimal manipulation. Integumentary (Hair, Skin) Wound #14 status is Open. Original cause of wound was Pressure Injury. The date acquired was: 08/23/2021. The wound has been in treatment 22 weeks. The wound is located on the Left Gluteus. The wound measures 0.5cm length x 0.2cm width x 0.2cm depth; 0.079cm^2 area and 0.016cm^3 volume. There is Fat Layer (Subcutaneous Tissue) exposed. There is a medium amount of sanguinous drainage noted. The wound margin is distinct with the outline attached to the wound base. There is large (67-100%) red, friable granulation within the wound bed. There is no necrotic tissue within the wound bed. The periwound skin appearance exhibited: Dry/Scaly. The periwound skin appearance did not exhibit: Atrophie Blanche, Cyanosis, Ecchymosis, Hemosiderin Staining, Mottled, Pallor, Rubor, Erythema. Assessment Active Problems ICD-10 Pressure ulcer of left buttock, stage 2 Venous insufficiency (chronic) (peripheral) Essential (primary)  hypertension Procedures Wound #14 Pre-procedure diagnosis of Wound #14 is a Pressure Ulcer located on the Left Gluteus . An Chemical Cauterization procedure was performed by Fredirick Maudlin, MD. Post procedure Diagnosis Wound #14: Same as Pre-Procedure Notes: Scribed for Dr. Celine Ahr by Blanche East, RN Plan Follow-up Appointments: Return appointment in 1 month. - Dr. Celine Ahr RM 1 Anesthetic: Wound #14 Left Gluteus: (In clinic) Topical Lidocaine 4% applied to wound bed Bathing/ Shower/ Hygiene: May shower and wash wound with soap and water. Off-Loading: Turn and reposition every 2 hours - stand for a few minutes at least every hour during the day WOUND #14: - Gluteus Wound Laterality: Left Cleanser: Soap and Water 1 x Per Day/30 Days Discharge Instructions: May shower and wash wound with dial antibacterial soap and water prior to dressing change. Prim Dressing: KerraCel Ag Gelling Fiber Dressing, 2x2 in (silver alginate) 1 x Per Day/30 Days ary Discharge Instructions: Apply silver alginate to wound bed as instructed Secondary Dressing: ALLEVYN Gentle Border, 4x4 (in/in) 1 x Per Day/30 Days Discharge Instructions: Apply over primary dressing as directed. 06/01/2022: The opening in the wound has contracted considerably. The tissues are still extremely friable and bleed with minimal manipulation. I used silver nitrate to chemically cauterize the friable granulation tissue in the orifice of the wound. Continue silver alginate and offloading. Follow-up in 1 month. Katrina [name], Katrina [name] ([id number]) [id number] Page 8 of 10 Electronic Signature(s) Signed: 06/02/2022 10:31:13 AM By: Fredirick Maudlin MD FACS Previous Signature: 06/01/2022 1:51:55 PM Version By: Fredirick Maudlin MD FACS Entered By: Fredirick Maudlin on 06/02/2022 10:31:12 -------------------------------------------------------------------------------- HxROS Details Patient Name: Date of Service: Katrina Amel  [name]. 06/01/2022 1:15 PM Medical Record Number: [id number] Patient Account Number: [id number] Date of Birth/Sex: Treating RN: 14-Mar-1932 (88 y.o. Katrina [name] Primary Care Provider: [name] Other Clinician: Referring Provider: Treating Provider/Extender: Kathlyn Sacramento in Treatment: 71 Information Obtained From Patient Eyes Medical History: Negative for: Cataracts; Glaucoma; Optic Neuritis Ear/Nose/Mouth/Throat Medical History: Negative for: Chronic sinus problems/congestion; Middle  ear problems Hematologic/Lymphatic Medical History: Negative for: Anemia; Hemophilia; Human Immunodeficiency Virus; Lymphedema; Sickle Cell Disease Respiratory Medical History: Positive for: Sleep Apnea - does not use Cpap or 02 as ordered Negative for: Aspiration; Asthma; Chronic Obstructive Pulmonary Disease (COPD); Pneumothorax; Tuberculosis Cardiovascular Medical History: Positive for: Arrhythmia - A-Fib; Hypertension; Phlebitis Negative for: Angina; Congestive Heart Failure; Coronary Artery Disease; Deep Vein Thrombosis; Hypotension; Myocardial Infarction; Peripheral Arterial Disease; Peripheral Venous Disease; Vasculitis Past Medical History Notes: Hyperlipidemia Gastrointestinal Medical History: Negative for: Cirrhosis ; Colitis; Crohns; Hepatitis A; Hepatitis B; Hepatitis C Endocrine Medical History: Negative for: Type I Diabetes; Type II Diabetes Past Medical History Notes: Hypothyroidism Genitourinary Medical History: Negative for: End Stage Renal Disease Immunological Medical History: Negative for: Lupus Erythematosus; Raynauds; Scleroderma Katrina [name], Katrina [name] ([id number]) [id number] Page 9 of 10 Integumentary (Skin) Medical History: Negative for: History of Burn Musculoskeletal Medical History: Negative for: Gout; Rheumatoid Arthritis; Osteoarthritis; Osteomyelitis Neurologic Medical History: Negative for: Dementia;  Neuropathy; Quadriplegia; Paraplegia; Seizure Disorder Oncologic Medical History: Negative for: Received Chemotherapy; Received Radiation Psychiatric Medical History: Negative for: Anorexia/bulimia; Confinement Anxiety Immunizations Pneumococcal Vaccine: Received Pneumococcal Vaccination: Yes Received Pneumococcal Vaccination On or After 60th Birthday: No Implantable Devices None Family and Social History Cancer: Yes - Siblings; Heart Disease: Yes - Mother,Father; Hereditary Spherocytosis: No; Hypertension: Yes - Father; Kidney Disease: No; Lung Disease: No; Seizures: No; Stroke: No; Thyroid Problems: No; Tuberculosis: No; Never smoker; Marital Status - Single; Alcohol Use: Never; Drug Use: No History; Caffeine Use: Rarely; Financial Concerns: No; Food, Clothing or Shelter Needs: No; Support System Lacking: No; Transportation Concerns: No Engineer, maintenance) Signed: 06/01/2022 1:54:01 PM By: Fredirick Maudlin MD FACS Signed: 06/02/2022 5:37:12 PM By: Baruch Gouty RN, BSN Entered By: Fredirick Maudlin on 06/01/2022 13:47:38 -------------------------------------------------------------------------------- [place] Details Patient Name: Date of Service: Katrina Amel [name]. 06/01/2022 Medical Record Number: [id number] Patient Account Number: [id number] Date of Birth/Sex: Treating RN: 04-29-1932 (88 y.o. Katrina [name] Primary Care Provider: [name] Other Clinician: Referring Provider: Treating Provider/Extender: Kathlyn [place] in Treatment: 22 Diagnosis Coding ICD-10 Codes Code Description [phone] Pressure ulcer of left buttock, stage 2 I87.2 Venous insufficiency (chronic) (peripheral) I10 Essential (primary) hypertension Facility Procedures : [place] CPT4 Code: [id number] A [name] ([id number]) L Description: [place] GRANULATION TISS ICD-10 Diagnosis Description [id number] 89.322 Pressure ulcer of left buttock, stage  2 Modifier: [id number] Quantity: 1 Page 10 of 10 Physician Procedures : CPT4 Code Description Modifier [id number] - WC PHYS LEVEL 3 - EST PT ICD-10 Diagnosis Description L89.322 Pressure ulcer of left buttock, stage 2 I87.2 Venous insufficiency (chronic) (peripheral) I10 Essential (primary) hypertension Quantity: 1 : [id number] - WC PHYS CHEM CAUT GRAN TISSUE ICD-10 Diagnosis Description L89.322 Pressure ulcer of left buttock, stage 2 Quantity: 1 Electronic Signature(s) Signed: 06/01/2022 1:52:12 PM By: Fredirick Maudlin MD FACS Entered By: Fredirick Maudlin on 06/01/2022 13:52:12

## 2022-07-02 ENCOUNTER — Encounter (HOSPITAL_BASED_OUTPATIENT_CLINIC_OR_DEPARTMENT_OTHER): Payer: Medicare Other | Attending: General Surgery | Admitting: General Surgery

## 2022-07-02 DIAGNOSIS — I872 Venous insufficiency (chronic) (peripheral): Secondary | ICD-10-CM | POA: Diagnosis not present

## 2022-07-02 DIAGNOSIS — L89322 Pressure ulcer of left buttock, stage 2: Secondary | ICD-10-CM | POA: Insufficient documentation

## 2022-07-02 DIAGNOSIS — I10 Essential (primary) hypertension: Secondary | ICD-10-CM | POA: Insufficient documentation

## 2022-07-05 NOTE — Progress Notes (Signed)
TOIYA, MORRISH ([id number]) [id number] Page 1 of 10 Visit Report for 07/02/2022 Chief Complaint Document Details Patient Name: Date of Service: Katrina [name], Katrina [name]. 07/02/2022 1:15 PM Medical Record Number: [id number] Patient Account Number: [id number] Date of Birth/Sex: Treating RN: 02/16/1932 (88 y.o. F) Primary Care Provider: [name] Other Clinician: Referring Provider: Treating Provider/Extender: Kathlyn Sacramento in Treatment: 26 Information Obtained from: Patient Chief Complaint Patient returns to the wound care center for reopened ulcer to: Left buttock Electronic Signature(s) Signed: 07/02/2022 2:08:36 PM By: Fredirick Maudlin MD FACS Entered By: Fredirick Maudlin on 07/02/2022 14:08:36 -------------------------------------------------------------------------------- HPI Details Patient Name: Date of Service: Katrina Amel [name]. 07/02/2022 1:15 PM Medical Record Number: [id number] Patient Account Number: [id number] Date of Birth/Sex: Treating RN: Dec 21, 1931 (88 y.o. F) Primary Care Provider: [name] Other Clinician: Referring Provider: Treating Provider/Extender: Kathlyn Sacramento in Treatment: 26 History of Present Illness Location: right lower extremity Quality: Patient reports experiencing a dull pain to affected area(s). Severity: Patient states wound(s) are getting better. Duration: Patient has had the wound for 3 months prior to presenting for treatment Context: The wound would happen gradually Modifying Factors: Patient wound(s)/ulcer(s) are worsening due to :standing for a long while. HPI Description: Past medical history of pulmonary emboli, hypothyroidism, hyperlipidemia, hypertension, sleep apnea, gallstones, ulcerated lower extremities, atrial fibrillation, status post total knee replacement, cholecystectomy, cystoscopy and stent placement for kidney stones. She has never been a  smoker. About a year ago she was seen by [name] with an ulcer of her right lower extremity which she's had for about over 3 months. On 10/23/14 -- Lower extremity venous duplex evaluation. She had no evidence of deep venous thromboses involving both lower extremities. There was evidence of deep vein reflux in the right and left common femoral veins. She also had evidence of great saphenous vein reflux noted only at the level of the saphenofemoral junction. No evidence of greater or small saphenous vein reflux. She will need a consult to the vascular surgeon. Seen by Dr. Mathis Fare office note dated 11/13/2014. He noted that she had good arterial blood flow with palpable DP pulses bilaterally. Based on the patient's history Dr. Scot Dock recommended elevation when at rest of both feet and knees and ankles above heart level in the supine position. Activity such as walking and pool exercises were recommended. She will also have 15-20 mm thigh-high compression stockings daily once the ulcers have completely healed. A prescription was given to her. 11/04/2015 -- the wound on her right lower extremity has completely healed and her edema has gone down significantly. However in her popliteal fossa where she's got a lot of fungal infection there is an area which is now an open wound and this will need attention. 12/02/2015 -- the right lower extremity looks much better than the edema has gone down significantly however she continues to have significant edema on the left side today. 12/09/2015 -- the patient has been doing her dressings as advised and says that overall she feels much better. She has received her juxta light for the left lower extremity READMISSION 06/25/2021 This is a patient who is actually had several previous visits to our clinic in 2014, 2016 and more recently 2017. This was largely because of wounds on her lower legs in the setting of chronic venous insufficiency and secondary lymphedema. Looking  at my records she was apparently prescribed thigh-high Ballow, Malachy Chamber ([id number]) [id number] Page 2 of 10 compression stockings probably by vein and vascular although  she comes back in not having worn any stockings in quite a period of time. She is now in the assisted living part of [facility]. She thinks her wounds have been there since April. This includes the left lateral, right anterior and right posterior lateral parts of both lower legs. She also has a stage II pressure ulcer on her left glued I think which is happened because of sleeping in a lift chair. She had ABDs and kerlix on her bilateral lower legs. Past medical history reviewed she has hypothyroidism, left buttock pressure ulcer dating back to March of this year, iron deficiency, falls, paroxysmal atrial fibrillation anticoagulated and chronic venous insufficiency ABIs in our clinic were 0.86 on the right and 0.96 on the left 11/10; patient comes into clinic today after being admitted last week with uncontrolled lymphedema, severe venous insufficiency and some superficial wounds on her bilateral lower legs. We put her in 3 layer compression and she comes in today with all the wounds closed and considerable improvement in the condition of her lower legs. The patient is going to need lifelong compression stockings probably juxta lites and we are going to go ahead and order those for her today. Unfortunately the POA here is a niece in [place] were probably going to have to talk with her to arrange proper payment for the juxta lite stockings. She is in the assisted living level of [facility]. She does not feel she can get the stockings on independently I am hopeful that the nurse and aids at the assisted living can help her with this otherwise this may be a problem. She also has an area on her left buttock which is a stage II wound. A lot of unhealthy irritated skin  around this I think from moisture prolonged sittingo Incontinence 11/17; the patient's area on the left buttock is just about closed although there is still a lot of unhealthy looking discolored skin around a large area here. I suspect this is chronic moisture, pressure, perhaps incontinence. she has minuscule open areas on her bilateral lower legs. Apparently our intermediary did not get the order for juxta lite stockings therefore she does not have any. 12/1; both leg wounds are healed and she has been wearing bilateral juxta lite stockings for 2 days. [facility] are assisting her with this. The left buttock wound unfortunately has deteriorated 12/15; 2-week follow-up. Her legs remain healed she has the other area which is on her left buttock. This is surrounded circumferentially by chronically irritated skin. Mirror image on the right side as well. I suspect this is chronic pressure friction and moisture from incontinence. This makes it difficult to get this area to completely close. We have been using silver alginate 12/29 both her legs remain closed and she has bilateral juxta lite stockings. The real problem here is her left buttock she has a small wound in the mid part of a extremely irritated dermatitis. It almost looks bruised in spots. There is superficial loss of epithelium. The actual wound that we have been dealing with does not look so bad. I have been assuming this is some combination of friction pressure wetness from constant urinary incontinence. If there is a dermatologic issue outside of that here I am not seeing it 09/03/2021; patient with a small area on the left buttock. The real problem here is a marked surrounding erythematous dermatitis with loss of surface epithelium. It looks as though she is beginning to develop  satellite lesions this may be mostly fungal 1/26; the area on her left buttock is closed however she still has very irritated macerated skin in this  area. Some of this appears fungal. I treated this with ketoconazole last time and I think it looks somewhat better although there is still some area present that still looks like a fungal dermatitis. Everything on the right buttock looks a lot better. READMISSION 12/28/2021: The patient returns today with a reopening of a wound on her left buttock. She says it has been present for about 6 months; I am not sure how this is possible since she was seen for similar in January and subsequently discharged from the clinic. Nonetheless, she has some purplish discoloration of her buttock with an open area and some scarring around the perimeter of a shallow stage II ulcer on her buttock. According to the previous clinic notes, there was some question as to whether or not this was due to fungal breakdown of her skin. She currently resides at [facility], in the assisted living section. She says that she cannot see the site and is unable to care for it herself. 01/11/2022: The wound is a little bit smaller today but has a very friable surface. It is an odd wound. On further inspection, it is almost as if there are dilated vessels and the underlying skin as I can compress the surrounding tissue, it dents in, and then fills back up, as if with fluid or blood. She did have an episode during the week when she had a lot of blood come from the wound, but is not actively bleeding today. 01/25/2022: No significant change to the wound. She continues to have purple discoloration of her buttocks, but it does still blanch. 02/08/2022: The wound was measured at slightly larger today, but to my inspection it appears about the same. It is most unusual and certainly not consistent with a typical pressure ulcer. I think it is potentially related to what ever is causing the purplish discoloration of her buttocks. Remains friable and bleeds easily with minimal manipulation. 02/22/2022: No real difference today. The base is clean without  any accumulation of slough or hypertrophic granulation tissue. I still think this may be a dermatological issue such as a venous lake or some variant of telangiectasia. 03/15/2022: The wound is unchanged. It bled fairly profusely when her dressing was removed. She has not yet received an appointment with dermatology. 04/09/2022: The orifice of the wound is smaller but the exposed tissue is still quite friable. She has an appointment with dermatology on August 22. 05/04/2022: She had a biopsy done by dermatology on August 22. We do not have the results. The patient reports she was told it was "staph" and prescribed clindamycin. She took her last capsule yesterday. T oday, the wound is basically unchanged. The surface is a little bit more flush with the surrounding tissue but it remains quite friable. There is some slough accumulation on the surface. 06/01/2022: The opening in the wound has contracted considerably. The tissues are still extremely friable and bleed with minimal manipulation. 07/02/2022: No real change to the wound, but the open area is less friable. Electronic Signature(s) Signed: 07/02/2022 2:09:44 PM By: Fredirick Maudlin MD FACS Entered By: Fredirick Maudlin on 07/02/2022 14:09:44 -------------------------------------------------------------------------------- Chemical Cauterization Details Patient Name: Date of Service: Katrina Amel [name]. 07/02/2022 1:15 PM Medical Record Number: [id number] Patient Account Number: [id number] Date of Birth/Sex: Treating RN: [birth year] (88 y.o. [address] [name] ([id number])  [id number].pdf Page 3 of 10 Primary Care Provider: [name] Other Clinician: Referring Provider: Treating Provider/Extender: Kathlyn Sacramento in Treatment: 26 Procedure Performed for: Wound #14 Left Gluteus Performed By: Physician Fredirick Maudlin, MD Post Procedure Diagnosis Same as Pre-procedure Notes using  silver nitrate stick Electronic Signature(s) Signed: 07/02/2022 4:34:03 PM By: Fredirick Maudlin MD FACS Signed: 07/05/2022 12:51:09 PM By: Baruch Gouty RN, BSN Entered By: Baruch Gouty on 07/02/2022 13:34:32 -------------------------------------------------------------------------------- Physical Exam Details Patient Name: Date of Service: Katrina Amel [name]. 07/02/2022 1:15 PM Medical Record Number: [id number] Patient Account Number: [id number] Date of Birth/Sex: Treating RN: 1932-05-05 (88 y.o. F) Primary Care Provider: [name] Other Clinician: Referring Provider: Treating Provider/Extender: Kathlyn Sacramento in Treatment: 26 Constitutional Hypertensive, asymptomatic. . . . No acute distress.Marland Kitchen Respiratory Normal work of breathing on room air.. Notes 07/02/2022: The open surface is less friable, but otherwise no real change. Electronic Signature(s) Signed: 07/02/2022 2:10:24 PM By: Fredirick Maudlin MD FACS Entered By: Fredirick Maudlin on 07/02/2022 14:10:24 -------------------------------------------------------------------------------- Physician Orders Details Patient Name: Date of Service: Katrina Amel [name]. 07/02/2022 1:15 PM Medical Record Number: [id number] Patient Account Number: [id number] Date of Birth/Sex: Treating RN: 06/10/1932 (87 y.o. [name] Primary Care Provider: [name] Other Clinician: Referring Provider: Treating Provider/Extender: Kathlyn Sacramento in Treatment: 26 Verbal / Phone Orders: No Diagnosis Coding ICD-10 Coding Code Description [phone] Pressure ulcer of left buttock, stage 2 I87.2 Venous insufficiency (chronic) (peripheral) Sol, Malachy Chamber ([id number]) [id number] Page 4 of 10 I10 Essential (primary) hypertension Follow-up Appointments Return appointment in 1 month. - Dr. Celine Ahr RM 1 Anesthetic Wound #14 Left Gluteus (In clinic) Topical Lidocaine 4%  applied to wound bed Bathing/ Shower/ Hygiene May shower and wash wound with soap and water. Off-Loading Turn and reposition every 2 hours - stand for a few minutes at least every hour during the day Wound Treatment Wound #14 - Gluteus Wound Laterality: Left Cleanser: Soap and Water 1 x Per Day/30 Days Discharge Instructions: May shower and wash wound with dial antibacterial soap and water prior to dressing change. Prim Dressing: KerraCel Ag Gelling Fiber Dressing, 2x2 in (silver alginate) 1 x Per Day/30 Days ary Discharge Instructions: Apply silver alginate to wound bed as instructed Secondary Dressing: ALLEVYN Gentle Border, 4x4 (in/in) 1 x Per Day/30 Days Discharge Instructions: Apply over primary dressing as directed. Electronic Signature(s) Signed: 07/02/2022 4:34:03 PM By: Fredirick Maudlin MD FACS Entered By: Fredirick Maudlin on 07/02/2022 14:11:23 -------------------------------------------------------------------------------- Problem List Details Patient Name: Date of Service: Katrina Amel [name]. 07/02/2022 1:15 PM Medical Record Number: [id number] Patient Account Number: [id number] Date of Birth/Sex: Treating RN: 1932-06-15 (88 y.o. [name] Primary Care Provider: [name] Other Clinician: Referring Provider: Treating Provider/Extender: Kathlyn Sacramento in Treatment: 26 Active Problems ICD-10 Encounter Code Description Active Date MDM Diagnosis [phone] Pressure ulcer of left buttock, stage 2 12/28/2021 No Yes I87.2 Venous insufficiency (chronic) (peripheral) 12/28/2021 No Yes I10 Essential (primary) hypertension 12/28/2021 No Yes Inactive Problems Resolved Problems Electronic Signature(s) Signed: 07/02/2022 2:07:01 PM By: Fredirick Maudlin MD FACS Katrina [name], Katrina [name] [phone] PM By: Fredirick Maudlin MD FACS [phone].pdf Page 5 of 10 Signed: 07/02/2022 Entered By: Fredirick Maudlin on 07/02/2022  14:07:00 -------------------------------------------------------------------------------- Progress Note Details Patient Name: Date of Service: Katrina [name], Katrina [name] 07/02/2022 1:15 PM Medical Record Number: [id number] Patient Account Number: [id number] Date of Birth/Sex: Treating RN: [birth year] (88 y.o. F) Primary Care Provider: [name] Other Clinician: Referring Provider: Treating Provider/Extender:  Kathlyn Sacramento in Treatment: 26 Subjective Chief Complaint Information obtained from Patient Patient returns to the wound care center for reopened ulcer to: Left buttock History of Present Illness (HPI) The following HPI elements were documented for the patient's wound: Location: right lower extremity Quality: Patient reports experiencing a dull pain to affected area(s). Severity: Patient states wound(s) are getting better. Duration: Patient has had the wound for 3 months prior to presenting for treatment Context: The wound would happen gradually Modifying Factors: Patient wound(s)/ulcer(s) are worsening due to :standing for a long while. Past medical history of pulmonary emboli, hypothyroidism, hyperlipidemia, hypertension, sleep apnea, gallstones, ulcerated lower extremities, atrial fibrillation, status post total knee replacement, cholecystectomy, cystoscopy and stent placement for kidney stones. She has never been a smoker. About a year ago she was seen by [name] with an ulcer of her right lower extremity which she's had for about over 3 months. On 10/23/14 -- Lower extremity venous duplex evaluation. She had no evidence of deep venous thromboses involving both lower extremities. There was evidence of deep vein reflux in the right and left common femoral veins. She also had evidence of great saphenous vein reflux noted only at the level of the saphenofemoral junction. No evidence of greater or small saphenous vein reflux. She will need a consult to the vascular  surgeon. Seen by Dr. Mathis Fare office note dated 11/13/2014. He noted that she had good arterial blood flow with palpable DP pulses bilaterally. Based on the patient's history Dr. Scot Dock recommended elevation when at rest of both feet and knees and ankles above heart level in the supine position. Activity such as walking and pool exercises were recommended. She will also have 15-20 mm thigh-high compression stockings daily once the ulcers have completely healed. A prescription was given to her. 11/04/2015 -- the wound on her right lower extremity has completely healed and her edema has gone down significantly. However in her popliteal fossa where she's got a lot of fungal infection there is an area which is now an open wound and this will need attention. 12/02/2015 -- the right lower extremity looks much better than the edema has gone down significantly however she continues to have significant edema on the left side today. 12/09/2015 -- the patient has been doing her dressings as advised and says that overall she feels much better. She has received her juxta light for the left lower extremity READMISSION 06/25/2021 This is a patient who is actually had several previous visits to our clinic in 2014, 2016 and more recently 2017. This was largely because of wounds on her lower legs in the setting of chronic venous insufficiency and secondary lymphedema. Looking at my records she was apparently prescribed thigh-high compression stockings probably by vein and vascular although she comes back in not having worn any stockings in quite a period of time. She is now in the assisted living part of [facility]. She thinks her wounds have been there since April. This includes the left lateral, right anterior and right posterior lateral parts of both lower legs. She also has a stage II pressure ulcer on her left glued I think which is happened because of sleeping in a lift chair. She  had ABDs and kerlix on her bilateral lower legs. Past medical history reviewed she has hypothyroidism, left buttock pressure ulcer dating back to March of this year, iron deficiency, falls, paroxysmal atrial fibrillation anticoagulated and chronic venous insufficiency ABIs in our clinic were 0.86 on the  right and 0.96 on the left 11/10; patient comes into clinic today after being admitted last week with uncontrolled lymphedema, severe venous insufficiency and some superficial wounds on her bilateral lower legs. We put her in 3 layer compression and she comes in today with all the wounds closed and considerable improvement in the condition of her lower legs. The patient is going to need lifelong compression stockings probably juxta lites and we are going to go ahead and order those for her today. Unfortunately the POA here is a niece in [place] were probably going to have to talk with her to arrange proper payment for the juxta lite stockings. She is in the assisted living level of [facility]. She does not feel she can get the stockings on independently I am hopeful that the nurse and aids at the assisted living can help her with this otherwise this may be a problem. She also has an area on her left buttock which is a stage II wound. A lot of unhealthy irritated skin around this I think from moisture prolonged sittingo Incontinence 11/17; the patient's area on the left buttock is just about closed although there is still a lot of unhealthy looking discolored skin around a large area here. I suspect this is chronic moisture, pressure, perhaps incontinence. she has minuscule open areas on her bilateral lower legs. Apparently our intermediary did not get the order for juxta lite stockings therefore she does not have any. 12/1; both leg wounds are healed and she has been wearing bilateral juxta lite stockings for 2 days. [facility] are assisting her with this. The  left buttock wound unfortunately has deteriorated 12/15; 2-week follow-up. Her legs remain healed she has the other area which is on her left buttock. This is surrounded circumferentially by chronically irritated skin. Mirror image on the right side as well. I suspect this is chronic pressure friction and moisture from incontinence. This makes it difficult to get this area to [place], [name] ([id number]) [id number] Page 6 of 10 completely close. We have been using silver alginate 12/29 both her legs remain closed and she has bilateral juxta lite stockings. The real problem here is her left buttock she has a small wound in the mid part of a extremely irritated dermatitis. It almost looks bruised in spots. There is superficial loss of epithelium. The actual wound that we have been dealing with does not look so bad. I have been assuming this is some combination of friction pressure wetness from constant urinary incontinence. If there is a dermatologic issue outside of that here I am not seeing it 09/03/2021; patient with a small area on the left buttock. The real problem here is a marked surrounding erythematous dermatitis with loss of surface epithelium. It looks as though she is beginning to develop satellite lesions this may be mostly fungal 1/26; the area on her left buttock is closed however she still has very irritated macerated skin in this area. Some of this appears fungal. I treated this with ketoconazole last time and I think it looks somewhat better although there is still some area present that still looks like a fungal dermatitis. Everything on the right buttock looks a lot better. READMISSION 12/28/2021: The patient returns today with a reopening of a wound on her left buttock. She says it has been present for about 6 months; I am not sure how this is possible since she was seen for similar in January and subsequently discharged from the  clinic. Nonetheless, she has  some purplish discoloration of her buttock with an open area and some scarring around the perimeter of a shallow stage II ulcer on her buttock. According to the previous clinic notes, there was some question as to whether or not this was due to fungal breakdown of her skin. She currently resides at [facility], in the assisted living section. She says that she cannot see the site and is unable to care for it herself. 01/11/2022: The wound is a little bit smaller today but has a very friable surface. It is an odd wound. On further inspection, it is almost as if there are dilated vessels and the underlying skin as I can compress the surrounding tissue, it dents in, and then fills back up, as if with fluid or blood. She did have an episode during the week when she had a lot of blood come from the wound, but is not actively bleeding today. 01/25/2022: No significant change to the wound. She continues to have purple discoloration of her buttocks, but it does still blanch. 02/08/2022: The wound was measured at slightly larger today, but to my inspection it appears about the same. It is most unusual and certainly not consistent with a typical pressure ulcer. I think it is potentially related to what ever is causing the purplish discoloration of her buttocks. Remains friable and bleeds easily with minimal manipulation. 02/22/2022: No real difference today. The base is clean without any accumulation of slough or hypertrophic granulation tissue. I still think this may be a dermatological issue such as a venous lake or some variant of telangiectasia. 03/15/2022: The wound is unchanged. It bled fairly profusely when her dressing was removed. She has not yet received an appointment with dermatology. 04/09/2022: The orifice of the wound is smaller but the exposed tissue is still quite friable. She has an appointment with dermatology on August 22. 05/04/2022: She had a biopsy done by dermatology on August 22. We do not have  the results. The patient reports she was told it was "staph" and prescribed clindamycin. She took her last capsule yesterday. T oday, the wound is basically unchanged. The surface is a little bit more flush with the surrounding tissue but it remains quite friable. There is some slough accumulation on the surface. 06/01/2022: The opening in the wound has contracted considerably. The tissues are still extremely friable and bleed with minimal manipulation. 07/02/2022: No real change to the wound, but the open area is less friable. Patient History Information obtained from Patient. Family History Cancer - Siblings, Heart Disease - Mother,Father, Hypertension - Father, No family history of Hereditary Spherocytosis, Kidney Disease, Lung Disease, Seizures, Stroke, Thyroid Problems, Tuberculosis. Social History Never smoker, Marital Status - Single, Alcohol Use - Never, Drug Use - No History, Caffeine Use - Rarely. Medical History Eyes Denies history of Cataracts, Glaucoma, Optic Neuritis Ear/Nose/Mouth/Throat Denies history of Chronic sinus problems/congestion, Middle ear problems Hematologic/Lymphatic Denies history of Anemia, Hemophilia, Human Immunodeficiency Virus, Lymphedema, Sickle Cell Disease Respiratory Patient has history of Sleep Apnea - does not use Cpap or 02 as ordered Denies history of Aspiration, Asthma, Chronic Obstructive Pulmonary Disease (COPD), Pneumothorax, Tuberculosis Cardiovascular Patient has history of Arrhythmia - A-Fib, Hypertension, Phlebitis Denies history of Angina, Congestive Heart Failure, Coronary Artery Disease, Deep Vein Thrombosis, Hypotension, Myocardial Infarction, Peripheral Arterial Disease, Peripheral Venous Disease, Vasculitis Gastrointestinal Denies history of Cirrhosis , Colitis, Crohnoos, Hepatitis A, Hepatitis B, Hepatitis C Endocrine Denies history of Type I Diabetes, Type II Diabetes Genitourinary  Denies history of End Stage Renal  Disease Immunological Denies history of Lupus Erythematosus, Raynaudoos, Scleroderma Integumentary (Skin) Denies history of History of Burn Musculoskeletal Denies history of Gout, Rheumatoid Arthritis, Osteoarthritis, Osteomyelitis Neurologic Denies history of Dementia, Neuropathy, Quadriplegia, Paraplegia, Seizure Disorder Oncologic Denies history of Received Chemotherapy, Received Radiation Psychiatric Denies history of Anorexia/bulimia, Confinement Anxiety Medical A Surgical History Notes nd Cardiovascular Hyperlipidemia Endocrine Gude, Katrina [name] ([id number]) [id number] Page 7 of 10 Hypothyroidism Objective Constitutional Hypertensive, asymptomatic. No acute distress.. Vitals Time Taken: 1:07 AM, Temperature: 98.8 F, Pulse: 62 bpm, Respiratory Rate: 20 breaths/min, Blood Pressure: 157/79 mmHg. Respiratory Normal work of breathing on room air.. General Notes: 07/02/2022: The open surface is less friable, but otherwise no real change. Integumentary (Hair, Skin) Wound #14 status is Open. Original cause of wound was Pressure Injury. The date acquired was: 08/23/2021. The wound has been in treatment 26 weeks. The wound is located on the Left Gluteus. The wound measures 0.2cm length x 0.6cm width x 0.3cm depth; 0.094cm^2 area and 0.028cm^3 volume. There is Fat Layer (Subcutaneous Tissue) exposed. There is no tunneling or undermining noted. There is a medium amount of sanguinous drainage noted. The wound margin is epibole. There is large (67-100%) red, friable granulation within the wound bed. There is no necrotic tissue within the wound bed. The periwound skin appearance had no abnormalities noted for texture. The periwound skin appearance had no abnormalities noted for moisture. The periwound skin appearance exhibited: Ecchymosis. The periwound skin appearance did not exhibit: Atrophie Blanche, Cyanosis, Hemosiderin Staining, Mottled, Pallor, Rubor,  Erythema. Periwound temperature was noted as No Abnormality. Assessment Active Problems ICD-10 Pressure ulcer of left buttock, stage 2 Venous insufficiency (chronic) (peripheral) Essential (primary) hypertension Procedures Wound #14 Pre-procedure diagnosis of Wound #14 is a Pressure Ulcer located on the Left Gluteus . An Chemical Cauterization procedure was performed by Fredirick Maudlin, MD. Post procedure Diagnosis Wound #14: Same as Pre-Procedure Notes: using silver nitrate stick Plan Follow-up Appointments: Return appointment in 1 month. - Dr. Celine Ahr RM 1 Anesthetic: Wound #14 Left Gluteus: (In clinic) Topical Lidocaine 4% applied to wound bed Bathing/ Shower/ Hygiene: May shower and wash wound with soap and water. Off-Loading: Turn and reposition every 2 hours - stand for a few minutes at least every hour during the day WOUND #14: - Gluteus Wound Laterality: Left Cleanser: Soap and Water 1 x Per Day/30 Days Discharge Instructions: May shower and wash wound with dial antibacterial soap and water prior to dressing change. Prim Dressing: KerraCel Ag Gelling Fiber Dressing, 2x2 in (silver alginate) 1 x Per Day/30 Days ary Discharge Instructions: Apply silver alginate to wound bed as instructed Secondary Dressing: ALLEVYN Gentle Border, 4x4 (in/in) 1 x Per Day/30 Days Discharge Instructions: Apply over primary dressing as directed. 07/02/2022: There has really been no change to this wound. I am still not sure what exactly we are dealing with here. In all honesty, it looks like a varicose vein Pennebaker, Trevor [name] ([id number]) [id number] Page 8 of 10 on the skin of her buttock that has a small ulceration. I chemically cauterized the surface with silver nitrate today. We will continue to apply silver alginate. She will follow-up in 1 month. Electronic Signature(s) Signed: 07/02/2022 2:12:46 PM By: Fredirick Maudlin MD FACS Entered By: Fredirick Maudlin on  07/02/2022 14:12:46 -------------------------------------------------------------------------------- HxROS Details Patient Name: Date of Service: Katrina Amel [name]. 07/02/2022 1:15 PM Medical Record Number: [id number] Patient Account Number: [id number] Date of Birth/Sex: Treating RN: 1932/07/24 (88 y.o. F) Primary Care Provider:  [name] Other Clinician: Referring Provider: Treating Provider/Extender: Kathlyn Sacramento in Treatment: 26 Information Obtained From Patient Eyes Medical History: Negative for: Cataracts; Glaucoma; Optic Neuritis Ear/Nose/Mouth/Throat Medical History: Negative for: Chronic sinus problems/congestion; Middle ear problems Hematologic/Lymphatic Medical History: Negative for: Anemia; Hemophilia; Human Immunodeficiency Virus; Lymphedema; Sickle Cell Disease Respiratory Medical History: Positive for: Sleep Apnea - does not use Cpap or 02 as ordered Negative for: Aspiration; Asthma; Chronic Obstructive Pulmonary Disease (COPD); Pneumothorax; Tuberculosis Cardiovascular Medical History: Positive for: Arrhythmia - A-Fib; Hypertension; Phlebitis Negative for: Angina; Congestive Heart Failure; Coronary Artery Disease; Deep Vein Thrombosis; Hypotension; Myocardial Infarction; Peripheral Arterial Disease; Peripheral Venous Disease; Vasculitis Past Medical History Notes: Hyperlipidemia Gastrointestinal Medical History: Negative for: Cirrhosis ; Colitis; Crohns; Hepatitis A; Hepatitis B; Hepatitis C Endocrine Medical History: Negative for: Type I Diabetes; Type II Diabetes Past Medical History Notes: Hypothyroidism Genitourinary Medical History: Negative for: End Stage Renal Disease Immunological Medical HistoryKORRINA, ZERN [name] ([id number]) [id number] Page 9 of 10 Negative for: Lupus Erythematosus; Raynauds; Scleroderma Integumentary (Skin) Medical History: Negative for: History of  Burn Musculoskeletal Medical History: Negative for: Gout; Rheumatoid Arthritis; Osteoarthritis; Osteomyelitis Neurologic Medical History: Negative for: Dementia; Neuropathy; Quadriplegia; Paraplegia; Seizure Disorder Oncologic Medical History: Negative for: Received Chemotherapy; Received Radiation Psychiatric Medical History: Negative for: Anorexia/bulimia; Confinement Anxiety Immunizations Pneumococcal Vaccine: Received Pneumococcal Vaccination: Yes Received Pneumococcal Vaccination On or After 60th Birthday: No Implantable Devices None Family and Social History Cancer: Yes - Siblings; Heart Disease: Yes - Mother,Father; Hereditary Spherocytosis: No; Hypertension: Yes - Father; Kidney Disease: No; Lung Disease: No; Seizures: No; Stroke: No; Thyroid Problems: No; Tuberculosis: No; Never smoker; Marital Status - Single; Alcohol Use: Never; Drug Use: No History; Caffeine Use: Rarely; Financial Concerns: No; Food, Clothing or Shelter Needs: No; Support System Lacking: No; Transportation Concerns: No Electronic Signature(s) Signed: 07/02/2022 4:34:03 PM By: Fredirick Maudlin MD FACS Entered By: Fredirick Maudlin on 07/02/2022 14:09:50 -------------------------------------------------------------------------------- SuperBill Details Patient Name: Date of Service: Katrina Amel [name]. 07/02/2022 Medical Record Number: [id number] Patient Account Number: [id number] Date of Birth/Sex: Treating RN: 10-05-1931 (88 y.o. [name] Primary Care Provider: [name] Other Clinician: Referring Provider: Treating Provider/Extender: Kathlyn Sacramento in Treatment: 26 Diagnosis Coding ICD-10 Codes Code Description [phone] Pressure ulcer of left buttock, stage 2 I87.2 Venous insufficiency (chronic) (peripheral) I10 Essential (primary) hypertension Facility Procedures : [place] CPT4 Code: [id number] A [name] ([id number]) IC L Description: 92924 - CHEM CAUT GRANULATION  TISS [phone] [name]-10 Diagnosis Description 89.322 Pressure ulcer of left buttock, stage 2 Modifier: [id number] Quantity: 1 f Page 10 of 10 Physician Procedures : CPT4 Code Description Modifier [phone] 91916 - WC PHYS LEVEL 3 - EST PT 25 ICD-10 Diagnosis Description L89.322 Pressure ulcer of left buttock, stage 2 I87.2 Venous insufficiency (chronic) (peripheral) I10 Essential (primary) hypertension Quantity: 1 : [id number] 17250 - WC PHYS CHEM CAUT GRAN TISSUE ICD-10 Diagnosis Description L89.322 Pressure ulcer of left buttock, stage 2 Quantity: 1 Electronic Signature(s) Signed: 07/02/2022 2:15:44 PM By: Fredirick Maudlin MD FACS Entered By: Fredirick Maudlin on 07/02/2022 14:15:43

## 2022-07-05 NOTE — Progress Notes (Signed)
Katrina [name] ([id number]) [id number] Page 1 of 7 Visit Report for 07/02/2022 Arrival Information Details Patient Name: Date of Service: Katrina [name], Katrina [name]. 07/02/2022 1:15 PM Medical Record Number: [id number] Patient Account Number: [id number] Date of Birth/Sex: Treating RN: 09-03-1931 (88 y.o. F) Primary Care Heraclio Seidman: [name] Other Clinician: Referring Hydia Copelin: Treating Esme Durkin/Extender: Kathlyn Sacramento in Treatment: 26 Visit Information History Since Last Visit All ordered tests and consults were completed: No Patient Arrived: Gilford Rile Added or deleted any medications: No Arrival Time: 13:07 Any new allergies or adverse reactions: No Accompanied By: self Had a fall or experienced change in No Transfer Assistance: None activities of daily living that may affect Patient Identification Verified: Yes risk of falls: Secondary Verification Process Completed: Yes Signs or symptoms of abuse/neglect since last visito No Patient Requires Transmission-Based Precautions: No Hospitalized since last visit: No Patient Has Alerts: No Implantable device outside of the clinic excluding No cellular tissue based products placed in the center since last visit: Pain Present Now: No Electronic Signature(s) Signed: 07/02/2022 1:35:59 PM By: Worthy Rancher Entered By: Worthy Rancher on 07/02/2022 13:07:42 -------------------------------------------------------------------------------- Encounter Discharge Information Details Patient Name: Date of Service: Katrina [name]. 07/02/2022 1:15 PM Medical Record Number: [id number] Patient Account Number: [id number] Date of Birth/Sex: Treating RN: 1931-11-10 (88 y.o. [name] Primary Care Katharin Schneider: [name] Other Clinician: Referring Kaylynn Chamblin: Treating Keilyn Nadal/Extender: Kathlyn Sacramento in Treatment: 26 Encounter Discharge Information Items Discharge Condition:  Stable Ambulatory Status: Walker Discharge Destination: Home Transportation: Other Accompanied By: self Schedule Follow-up Appointment: Yes Clinical Summary of Care: Patient Declined Notes ALF facility transportation Electronic Signature(s) Signed: 07/05/2022 12:51:09 PM By: Baruch Gouty RN, BSN Entered By: Baruch Gouty on 07/02/2022 13:44:17 Milnes, Katrina [name] ([id number]) [id number] Page 2 of 7 -------------------------------------------------------------------------------- Lower Extremity Assessment Details Patient Name: Date of Service: Katrina [name], Katrina [name] 07/02/2022 1:15 PM Medical Record Number: [id number] Patient Account Number: [id number] Date of Birth/Sex: Treating RN: 06/29/1932 (88 y.o. [name] Primary Care Danyella Mcginty: [name] Other Clinician: Referring Raven Harmes: Treating Briceida Rasberry/Extender: Kathlyn Sacramento in Treatment: 26 Electronic Signature(s) Signed: 07/05/2022 12:51:09 PM By: Baruch Gouty RN, BSN Entered By: Baruch Gouty on 07/02/2022 13:23:44 -------------------------------------------------------------------------------- Multi Wound Chart Details Patient Name: Date of Service: Katrina [name]. 07/02/2022 1:15 PM Medical Record Number: [id number] Patient Account Number: [id number] Date of Birth/Sex: Treating RN: October 09, 1931 (88 y.o. F) Primary Care Reyana Leisey: [name] Other Clinician: Referring Regis Hinton: Treating Evoleht Hovatter/Extender: Kathlyn Sacramento in Treatment: 26 Vital Signs Height(in): Pulse(bpm): 24 Weight(lbs): Blood Pressure(mmHg): 157/79 Body Mass Index(BMI): Temperature(F): 98.8 Respiratory Rate(breaths/min): 20 [14:Photos:] [N/A:N/A] Left Gluteus N/A N/A Wound Location: Pressure Injury N/A N/A Wounding Event: Pressure Ulcer N/A N/A Primary Etiology: Sleep Apnea, Arrhythmia, N/A N/A Comorbid History: Hypertension, Phlebitis 08/23/2021 N/A  N/A Date Acquired: 102 N/A N/A Weeks of Treatment: Open N/A N/A Wound Status: No N/A N/A Wound Recurrence: 0.2x0.6x0.3 N/A N/A Measurements L x W x [name] (cm) 0.094 N/A N/A A (cm) : rea 0.028 N/A N/A Volume (cm) : 92.50% N/A N/A % Reduction in A rea: 88.80% N/A N/A % Reduction in Volume: Category/Stage III N/A N/A Classification: Medium N/A N/A Exudate A mount: Sanguinous N/A N/A Exudate Type: red N/A N/A Exudate Color: Epibole N/A N/A Wound Margin: Large (67-100%) N/A N/A Granulation A mount: Red, Friable N/A N/A Granulation Quality: None Present (0%) N/A N/A Necrotic A mount: Caradonna, Katrina [name] ([id number]) [id number] Page 3 of 7 Fat Layer (Subcutaneous Tissue): Yes  N/A N/A Exposed Structures: Fascia: No Tendon: No Muscle: No Joint: No Bone: No None N/A N/A Epithelialization: No Abnormalities Noted N/A N/A Periwound Skin Texture: Dry/Scaly: Yes N/A N/A Periwound Skin Moisture: Ecchymosis: Yes N/A N/A Periwound Skin Color: Atrophie Blanche: No Cyanosis: No Erythema: No Hemosiderin Staining: No Mottled: No Pallor: No Rubor: No No Abnormality N/A N/A Temperature: Chemical Cauterization N/A N/A Procedures Performed: Treatment Notes Wound #14 (Gluteus) Wound Laterality: Left Cleanser Soap and Water Discharge Instruction: May shower and wash wound with dial antibacterial soap and water prior to dressing change. Peri-Wound Care Topical Primary Dressing KerraCel Ag Gelling Fiber Dressing, 2x2 in (silver alginate) Discharge Instruction: Apply silver alginate to wound bed as instructed Secondary Dressing ALLEVYN Gentle Border, 4x4 (in/in) Discharge Instruction: Apply over primary dressing as directed. Secured With Compression Wrap Compression Stockings Environmental education officer) Signed: 07/02/2022 2:07:58 PM By: Fredirick Maudlin MD FACS Entered By: Fredirick Maudlin on 07/02/2022  14:07:58 -------------------------------------------------------------------------------- Multi-Disciplinary Care Plan Details Patient Name: Date of Service: Katrina [name]. 07/02/2022 1:15 PM Medical Record Number: [id number] Patient Account Number: [id number] Date of Birth/Sex: Treating RN: 1931-12-24 (88 y.o. [name] Primary Care Provider: [name] Other Clinician: Referring Provider: Treating Provider/Extender: Kathlyn [place] in Treatment: [place] reviewed with physician Active Inactive Wound/Skin Impairment Nursing DiagnosesARNETT, GALINDEZ ([id number]) [id number] Page 4 of 7 Impaired tissue integrity Knowledge deficit related to ulceration/compromised skin integrity Goals: Patient/caregiver will verbalize understanding of skin care regimen Date Initiated: 12/28/2021 Target Resolution Date: 07/30/2022 Goal Status: Active Ulcer/skin breakdown will have a volume reduction of 30% by week 4 Date Initiated: 12/28/2021 Date Inactivated: 01/25/2022 Target Resolution Date: 01/25/2022 Goal Status: Met Ulcer/skin breakdown will have a volume reduction of 50% by week 8 Date Initiated: 01/25/2022 Date Inactivated: 02/22/2022 Target Resolution Date: 02/22/2022 Unmet Reason: atypical wound, derm Goal Status: Unmet consult Interventions: Assess patient/caregiver ability to obtain necessary supplies Assess patient/caregiver ability to perform ulcer/skin care regimen upon admission and as needed Assess ulceration(s) every visit Provide education on ulcer and skin care Treatment Activities: Skin care regimen initiated : 12/28/2021 Topical wound management initiated : 12/28/2021 Notes: Electronic Signature(s) Signed: 07/05/2022 12:51:09 PM By: Baruch Gouty RN, BSN Entered By: Baruch Gouty on 07/02/2022 13:25:24 -------------------------------------------------------------------------------- Pain Assessment  Details Patient Name: Date of Service: Katrina [name]. 07/02/2022 1:15 PM Medical Record Number: [id number] Patient Account Number: [id number] Date of Birth/Sex: Treating RN: 1932-01-08 (88 y.o. F) Primary Care Provider: [name] Other Clinician: Referring Provider: Treating Provider/Extender: Kathlyn Sacramento in Treatment: 26 Active Problems Location of Pain Severity and Description of Pain Patient Has Paino No Site Locations Rate the pain. Current Pain Level: 0 Pain Management and Medication Current Pain Management: Bagot, Katrina [name] ([id number]) [id number] Page 5 of 7 Electronic Signature(s) Signed: 07/05/2022 12:51:09 PM By: Baruch Gouty RN, BSN Entered By: Baruch Gouty on 07/02/2022 13:23:36 -------------------------------------------------------------------------------- Patient/Caregiver Education Details Patient Name: Date of Service: Katrina [name]. 11/10/2023andnbsp1:15 PM Medical Record Number: [id number] Patient Account Number: [id number] Date of Birth/Gender: Treating RN: 15-Sep-1931 (88 y.o. [name] Primary Care Physician: [name] Other Clinician: Referring Physician: Treating Physician/Extender: Kathlyn [place] in Treatment: 26 Education Assessment Education Provided To: Patient Education Topics Provided Pressure: Methods: Explain/Verbal Responses: Reinforcements needed, State content correctly Wound/Skin Impairment: Methods: Explain/Verbal Responses: Reinforcements needed, State content correctly Electronic Signature(s) Signed: 07/05/2022 12:51:09 PM By: Baruch Gouty RN, BSN Entered By: Baruch Gouty on 07/02/2022 13:25:46 -------------------------------------------------------------------------------- Wound Assessment Details  Patient Name: Date of Service: Katrina [name], Katrina [name] 07/02/2022 1:15 PM Medical Record Number: [id number] Patient Account Number:  [id number] Date of Birth/Sex: Treating RN: 09/29/1931 (88 y.o. F) Primary Care Provider: [name] Other Clinician: Referring Provider: Treating Provider/Extender: Kathlyn [place] in Treatment: 26 Wound Status Wound Number: 14 Primary Etiology: Pressure Ulcer Wound Location: Left Gluteus Wound Status: Open Wounding Event: Pressure Injury Comorbid History: Sleep Apnea, Arrhythmia, Hypertension, Phlebitis Date Acquired: 08/23/2021 Weeks Of Treatment: 26 Clustered Wound: No Photos Hupfer, Katrina [name] ([id number]) [id number] Page 6 of 7 Wound Measurements Length: (cm) 0.2 Width: (cm) 0.6 Depth: (cm) 0.3 Area: (cm) 0.094 Volume: (cm) 0.028 % Reduction in Area: 92.5% % Reduction in Volume: 88.8% Epithelialization: None Tunneling: No Undermining: No Wound Description Classification: Category/Stage III Wound Margin: Epibole Exudate Amount: Medium Exudate Type: Sanguinous Exudate Color: red Foul Odor After Cleansing: No Slough/Fibrino No Wound Bed Granulation Amount: Large (67-100%) Exposed Structure Granulation Quality: Red, Friable Fascia Exposed: No Necrotic Amount: None Present (0%) Fat Layer (Subcutaneous Tissue) Exposed: Yes Tendon Exposed: No Muscle Exposed: No Joint Exposed: No Bone Exposed: No Periwound Skin Texture Texture Color No Abnormalities Noted: Yes No Abnormalities Noted: No Atrophie Blanche: No Moisture Cyanosis: No No Abnormalities Noted: Yes Ecchymosis: Yes Erythema: No Hemosiderin Staining: No Mottled: No Pallor: No Rubor: No Temperature / Pain Temperature: No Abnormality Treatment Notes Wound #14 (Gluteus) Wound Laterality: Left Cleanser Soap and Water Discharge Instruction: May shower and wash wound with dial antibacterial soap and water prior to dressing change. Peri-Wound Care Topical Primary Dressing KerraCel Ag Gelling Fiber Dressing, 2x2 in (silver alginate) Discharge  Instruction: Apply silver alginate to wound bed as instructed Secondary Dressing ALLEVYN Gentle Border, 4x4 (in/in) Discharge Instruction: Apply over primary dressing as directed. Secured With Compression Wrap Compression Stockings Dungan, Katrina [name] ([id number]) [id number] Page 7 of 7 Add-Ons Electronic Signature(s) Signed: 07/05/2022 12:51:09 PM By: Baruch Gouty RN, BSN Entered By: Baruch Gouty on 07/02/2022 13:24:49 -------------------------------------------------------------------------------- Vitals Details Patient Name: Date of Service: Katrina [name]. 07/02/2022 1:15 PM Medical Record Number: [id number] Patient Account Number: [id number] Date of Birth/Sex: Treating RN: Dec 03, 1931 (88 y.o. F) Primary Care Provider: [name] Other Clinician: Referring Provider: Treating Provider/Extender: Kathlyn [place] in Treatment: 26 Vital Signs Time Taken: 01:07 Temperature (F): 98.8 Pulse (bpm): 62 Respiratory Rate (breaths/min): 20 Blood Pressure (mmHg): 157/79 Reference Range: 80 - 120 mg / dl Electronic Signature(s) Signed: 07/02/2022 1:35:59 PM By: Worthy Rancher Entered By: Worthy Rancher on 07/02/2022 13:11:16

## 2022-07-30 ENCOUNTER — Encounter (HOSPITAL_BASED_OUTPATIENT_CLINIC_OR_DEPARTMENT_OTHER): Payer: Medicare Other | Attending: General Surgery | Admitting: General Surgery

## 2022-07-30 DIAGNOSIS — I10 Essential (primary) hypertension: Secondary | ICD-10-CM | POA: Insufficient documentation

## 2022-07-30 DIAGNOSIS — I480 Paroxysmal atrial fibrillation: Secondary | ICD-10-CM | POA: Diagnosis not present

## 2022-07-30 DIAGNOSIS — Z8249 Family history of ischemic heart disease and other diseases of the circulatory system: Secondary | ICD-10-CM | POA: Insufficient documentation

## 2022-07-30 DIAGNOSIS — I872 Venous insufficiency (chronic) (peripheral): Secondary | ICD-10-CM | POA: Insufficient documentation

## 2022-07-30 DIAGNOSIS — Z7901 Long term (current) use of anticoagulants: Secondary | ICD-10-CM | POA: Diagnosis not present

## 2022-07-30 DIAGNOSIS — L89322 Pressure ulcer of left buttock, stage 2: Secondary | ICD-10-CM | POA: Insufficient documentation

## 2022-07-30 DIAGNOSIS — E039 Hypothyroidism, unspecified: Secondary | ICD-10-CM | POA: Diagnosis not present

## 2022-07-31 NOTE — Progress Notes (Signed)
GRACY, EHLY [name] ([id number]) [id number] Page 1 of 9 Visit Report for 07/30/2022 Chief Complaint Document Details Patient Name: Date of Service: Katrina [name], Katrina [name]. 07/30/2022 1:15 PM Medical Record Number: [id number] Patient Account Number: [id number] Date of Birth/Sex: Treating RN: [birth year] (88 y.o. F) Primary Care Provider: [name] Other Clinician: Referring Provider: Treating Provider/Extender: Kathlyn Sacramento in Treatment: 30 Information Obtained from: Patient Chief Complaint Patient returns to the wound care center for reopened ulcer to: Left buttock Electronic Signature(s) Signed: 07/30/2022 1:45:35 PM By: Fredirick Maudlin MD FACS Entered By: Fredirick Maudlin on 07/30/2022 13:45:35 -------------------------------------------------------------------------------- HPI Details Patient Name: Date of Service: Katrina Amel [name]. 07/30/2022 1:15 PM Medical Record Number: [id number] Patient Account Number: [id number] Date of Birth/Sex: Treating RN: March 07, 1932 (88 y.o. F) Primary Care Provider: [name] Other Clinician: Referring Provider: Treating Provider/Extender: Kathlyn Sacramento in Treatment: 30 History of Present Illness Location: right lower extremity Quality: Patient reports experiencing a dull pain to affected area(s). Severity: Patient states wound(s) are getting better. Duration: Patient has had the wound for 3 months prior to presenting for treatment Context: The wound would happen gradually Modifying Factors: Patient wound(s)/ulcer(s) are worsening due to :standing for a long while. HPI Description: Past medical history of pulmonary emboli, hypothyroidism, hyperlipidemia, hypertension, sleep apnea, gallstones, ulcerated lower extremities, atrial fibrillation, status post total knee replacement, cholecystectomy, cystoscopy and stent placement for kidney stones. She has never been a smoker. About  a year ago she was seen by [name] with an ulcer of her right lower extremity which she's had for about over 3 months. On 10/23/14 -- Lower extremity venous duplex evaluation. She had no evidence of deep venous thromboses involving both lower extremities. There was evidence of deep vein reflux in the right and left common femoral veins. She also had evidence of great saphenous vein reflux noted only at the level of the saphenofemoral junction. No evidence of greater or small saphenous vein reflux. She will need a consult to the vascular surgeon. Seen by Dr. Mathis Fare office note dated 11/13/2014. He noted that she had good arterial blood flow with palpable DP pulses bilaterally. Based on the patient's history Dr. Scot Dock recommended elevation when at rest of both feet and knees and ankles above heart level in the supine position. Activity such as walking and pool exercises were recommended. She will also have 15-20 mm thigh-high compression stockings daily once the ulcers have completely healed. A prescription was given to her. 11/04/2015 -- the wound on her right lower extremity has completely healed and her edema has gone down significantly. However in her popliteal fossa where she's got a lot of fungal infection there is an area which is now an open wound and this will need attention. 12/02/2015 -- the right lower extremity looks much better than the edema has gone down significantly however she continues to have significant edema on the left side today. 12/09/2015 -- the patient has been doing her dressings as advised and says that overall she feels much better. She has received her juxta light for the left lower extremity READMISSION 06/25/2021 This is a patient who is actually had several previous visits to our clinic in 2014, 2016 and more recently 2017. This was largely because of wounds on her lower legs in the setting of chronic venous insufficiency and secondary lymphedema. Looking at my records  she was apparently prescribed thigh-high [place], [facility] [name] ([id number]) [id number] Page 2 of 9 compression stockings probably by vein and vascular although  she comes back in not having worn any stockings in quite a period of time. She is now in the assisted living part of [facility]. She thinks her wounds have been there since April. This includes the left lateral, right anterior and right posterior lateral parts of both lower legs. She also has a stage II pressure ulcer on her left glued I think which is happened because of sleeping in a lift chair. She had ABDs and kerlix on her bilateral lower legs. Past medical history reviewed she has hypothyroidism, left buttock pressure ulcer dating back to March of this year, iron deficiency, falls, paroxysmal atrial fibrillation anticoagulated and chronic venous insufficiency ABIs in our clinic were 0.86 on the right and 0.96 on the left 11/10; patient comes into clinic today after being admitted last week with uncontrolled lymphedema, severe venous insufficiency and some superficial wounds on her bilateral lower legs. We put her in 3 layer compression and she comes in today with all the wounds closed and considerable improvement in the condition of her lower legs. The patient is going to need lifelong compression stockings probably juxta lites and we are going to go ahead and order those for her today. Unfortunately the POA here is a niece in [place] were probably going to have to talk with her to arrange proper payment for the juxta lite stockings. She is in the assisted living level of [facility]. She does not feel she can get the stockings on independently I am hopeful that the nurse and aids at the assisted living can help her with this otherwise this may be a problem. She also has an area on her left buttock which is a stage II wound. A lot of unhealthy irritated skin around this I  think from moisture prolonged sittingo Incontinence 11/17; the patient's area on the left buttock is just about closed although there is still a lot of unhealthy looking discolored skin around a large area here. I suspect this is chronic moisture, pressure, perhaps incontinence. she has minuscule open areas on her bilateral lower legs. Apparently our intermediary did not get the order for juxta lite stockings therefore she does not have any. 12/1; both leg wounds are healed and she has been wearing bilateral juxta lite stockings for 2 days. [facility] are assisting her with this. The left buttock wound unfortunately has deteriorated 12/15; 2-week follow-up. Her legs remain healed she has the other area which is on her left buttock. This is surrounded circumferentially by chronically irritated skin. Mirror image on the right side as well. I suspect this is chronic pressure friction and moisture from incontinence. This makes it difficult to get this area to completely close. We have been using silver alginate 12/29 both her legs remain closed and she has bilateral juxta lite stockings. The real problem here is her left buttock she has a small wound in the mid part of a extremely irritated dermatitis. It almost looks bruised in spots. There is superficial loss of epithelium. The actual wound that we have been dealing with does not look so bad. I have been assuming this is some combination of friction pressure wetness from constant urinary incontinence. If there is a dermatologic issue outside of that here I am not seeing it 09/03/2021; patient with a small area on the left buttock. The real problem here is a marked surrounding erythematous dermatitis with loss of surface epithelium. It looks as though she is beginning to develop  satellite lesions this may be mostly fungal 1/26; the area on her left buttock is closed however she still has very irritated macerated skin in this area. Some of  this appears fungal. I treated this with ketoconazole last time and I think it looks somewhat better although there is still some area present that still looks like a fungal dermatitis. Everything on the right buttock looks a lot better. READMISSION 12/28/2021: The patient returns today with a reopening of a wound on her left buttock. She says it has been present for about 6 months; I am not sure how this is possible since she was seen for similar in January and subsequently discharged from the clinic. Nonetheless, she has some purplish discoloration of her buttock with an open area and some scarring around the perimeter of a shallow stage II ulcer on her buttock. According to the previous clinic notes, there was some question as to whether or not this was due to fungal breakdown of her skin. She currently resides at [facility], in the assisted living section. She says that she cannot see the site and is unable to care for it herself. 01/11/2022: The wound is a little bit smaller today but has a very friable surface. It is an odd wound. On further inspection, it is almost as if there are dilated vessels and the underlying skin as I can compress the surrounding tissue, it dents in, and then fills back up, as if with fluid or blood. She did have an episode during the week when she had a lot of blood come from the wound, but is not actively bleeding today. 01/25/2022: No significant change to the wound. She continues to have purple discoloration of her buttocks, but it does still blanch. 02/08/2022: The wound was measured at slightly larger today, but to my inspection it appears about the same. It is most unusual and certainly not consistent with a typical pressure ulcer. I think it is potentially related to what ever is causing the purplish discoloration of her buttocks. Remains friable and bleeds easily with minimal manipulation. 02/22/2022: No real difference today. The base is clean without any  accumulation of slough or hypertrophic granulation tissue. I still think this may be a dermatological issue such as a venous lake or some variant of telangiectasia. 03/15/2022: The wound is unchanged. It bled fairly profusely when her dressing was removed. She has not yet received an appointment with dermatology. 04/09/2022: The orifice of the wound is smaller but the exposed tissue is still quite friable. She has an appointment with dermatology on August 22. 05/04/2022: She had a biopsy done by dermatology on August 22. We do not have the results. The patient reports she was told it was "staph" and prescribed clindamycin. She took her last capsule yesterday. T oday, the wound is basically unchanged. The surface is a little bit more flush with the surrounding tissue but it remains quite friable. There is some slough accumulation on the surface. 06/01/2022: The opening in the wound has contracted considerably. The tissues are still extremely friable and bleed with minimal manipulation. 07/02/2022: No real change to the wound, but the open area is less friable. 07/30/2022: The open portion of the wound is quite a bit smaller and the tissue is less friable. The patient reports that she has not been experiencing much bleeding at all at home. Electronic Signature(s) Signed: 07/30/2022 1:46:08 PM By: Fredirick Maudlin MD FACS Entered By: Fredirick Maudlin on 07/30/2022 13:46:08 Physical Exam Details -------------------------------------------------------------------------------- Katrina [name], Katrina Capers  [name] ([id number]) [id number] Page 3 of 9 Patient Name: Date of Service: Katrina [name], Katrina [name] 07/30/2022 1:15 PM Medical Record Number: [id number] Patient Account Number: [id number] Date of Birth/Sex: Treating RN: [birth year] (87 y.o. F) Primary Care Provider: [name] Other Clinician: Referring Provider: Treating Provider/Extender: Kathlyn Sacramento in Treatment:  22 Constitutional She is hypertensive, but asymptomatic.. . . . No acute distress. Respiratory Normal work of breathing on room air. Notes 07/30/2022: The open portion of the wound is quite a bit smaller and the tissue is less friable. Electronic Signature(s) Signed: 07/30/2022 1:56:08 PM By: Fredirick Maudlin MD FACS Entered By: Fredirick Maudlin on 07/30/2022 13:56:08 -------------------------------------------------------------------------------- Physician Orders Details Patient Name: Date of Service: Katrina Amel [name]. 07/30/2022 1:15 PM Medical Record Number: [id number] Patient Account Number: [id number] Date of Birth/Sex: Treating RN: 09-28-1931 (88 y.o. [place] Brown Primary Care Provider: [name] Other Clinician: Referring Provider: Treating Provider/Extender: Kathlyn Sacramento in Treatment: 33 Verbal / Phone Orders: No Diagnosis Coding ICD-10 Coding Code Description [phone] Pressure ulcer of left buttock, stage 2 I87.2 Venous insufficiency (chronic) (peripheral) I10 Essential (primary) hypertension Follow-up Appointments Return appointment in 1 month. - Dr. Celine Ahr RM 1 Anesthetic Wound #14 Left Gluteus (In clinic) Topical Lidocaine 4% applied to wound bed Bathing/ Shower/ Hygiene May shower and wash wound with soap and water. Off-Loading Turn and reposition every 2 hours - stand for a few minutes at least every hour during the day Wound Treatment Wound #14 - Gluteus Wound Laterality: Left Cleanser: Soap and Water 1 x Per Day/30 Days Discharge Instructions: May shower and wash wound with dial antibacterial soap and water prior to dressing change. Prim Dressing: KerraCel Ag Gelling Fiber Dressing, 2x2 in (silver alginate) 1 x Per Day/30 Days ary Discharge Instructions: Apply silver alginate to wound bed as instructed Secondary Dressing: ALLEVYN Gentle Border, 4x4 (in/in) 1 x Per Day/30 Days Discharge Instructions: Apply over primary  dressing as directed. Katrina [name], Katrina [name] ([id number]) [id number] Page 4 of 9 Electronic Signature(s) Signed: 07/30/2022 2:01:18 PM By: Fredirick Maudlin MD FACS Entered By: Fredirick Maudlin on 07/30/2022 13:58:58 -------------------------------------------------------------------------------- Problem List Details Patient Name: Date of Service: Katrina Amel [name]. 07/30/2022 1:15 PM Medical Record Number: [id number] Patient Account Number: [id number] Date of Birth/Sex: Treating RN: 1931-08-28 (88 y.o. F) Primary Care Provider: [name] Other Clinician: Referring Provider: Treating Provider/Extender: Kathlyn Sacramento in Treatment: 30 Active Problems ICD-10 Encounter Code Description Active Date MDM Diagnosis [phone] Pressure ulcer of left buttock, stage 2 12/28/2021 No Yes I87.2 Venous insufficiency (chronic) (peripheral) 12/28/2021 No Yes I10 Essential (primary) hypertension 12/28/2021 No Yes Inactive Problems Resolved Problems Electronic Signature(s) Signed: 07/30/2022 1:45:23 PM By: Fredirick Maudlin MD FACS Entered By: Fredirick Maudlin on 07/30/2022 13:45:23 -------------------------------------------------------------------------------- Progress Note Details Patient Name: Date of Service: Katrina Amel [name]. 07/30/2022 1:15 PM Medical Record Number: [id number] Patient Account Number: [id number] Date of Birth/Sex: Treating RN: 1932/08/09 (88 y.o. F) Primary Care Provider: [name] Other Clinician: Referring Provider: Treating Provider/Extender: Kathlyn Sacramento in Treatment: 30 Subjective Chief Complaint Information obtained from Patient Patient returns to the wound care center for reopened ulcer to: Left buttock History of Present Illness (HPI) The following HPI elements were documented for the patient's wound: Location: right lower extremity Quality: Patient reports experiencing a dull pain to affected  area(s). Katrina [name], Katrina [name] ([id number]) [id number] Page 5 of 9 Severity: Patient states wound(s) are getting better. Duration: Patient has had the wound for 3 months  prior to presenting for treatment Context: The wound would happen gradually Modifying Factors: Patient wound(s)/ulcer(s) are worsening due to :standing for a long while. Past medical history of pulmonary emboli, hypothyroidism, hyperlipidemia, hypertension, sleep apnea, gallstones, ulcerated lower extremities, atrial fibrillation, status post total knee replacement, cholecystectomy, cystoscopy and stent placement for kidney stones. She has never been a smoker. About a year ago she was seen by [name] with an ulcer of her right lower extremity which she's had for about over 3 months. On 10/23/14 -- Lower extremity venous duplex evaluation. She had no evidence of deep venous thromboses involving both lower extremities. There was evidence of deep vein reflux in the right and left common femoral veins. She also had evidence of great saphenous vein reflux noted only at the level of the saphenofemoral junction. No evidence of greater or small saphenous vein reflux. She will need a consult to the vascular surgeon. Seen by Dr. Mathis Fare office note dated 11/13/2014. He noted that she had good arterial blood flow with palpable DP pulses bilaterally. Based on the patient's history Dr. Scot Dock recommended elevation when at rest of both feet and knees and ankles above heart level in the supine position. Activity such as walking and pool exercises were recommended. She will also have 15-20 mm thigh-high compression stockings daily once the ulcers have completely healed. A prescription was given to her. 11/04/2015 -- the wound on her right lower extremity has completely healed and her edema has gone down significantly. However in her popliteal fossa where she's got a lot of fungal infection there is an area which is now an open wound  and this will need attention. 12/02/2015 -- the right lower extremity looks much better than the edema has gone down significantly however she continues to have significant edema on the left side today. 12/09/2015 -- the patient has been doing her dressings as advised and says that overall she feels much better. She has received her juxta light for the left lower extremity READMISSION 06/25/2021 This is a patient who is actually had several previous visits to our clinic in 2014, 2016 and more recently 2017. This was largely because of wounds on her lower legs in the setting of chronic venous insufficiency and secondary lymphedema. Looking at my records she was apparently prescribed thigh-high compression stockings probably by vein and vascular although she comes back in not having worn any stockings in quite a period of time. She is now in the assisted living part of [facility]. She thinks her wounds have been there since April. This includes the left lateral, right anterior and right posterior lateral parts of both lower legs. She also has a stage II pressure ulcer on her left glued I think which is happened because of sleeping in a lift chair. She had ABDs and kerlix on her bilateral lower legs. Past medical history reviewed she has hypothyroidism, left buttock pressure ulcer dating back to March of this year, iron deficiency, falls, paroxysmal atrial fibrillation anticoagulated and chronic venous insufficiency ABIs in our clinic were 0.86 on the right and 0.96 on the left 11/10; patient comes into clinic today after being admitted last week with uncontrolled lymphedema, severe venous insufficiency and some superficial wounds on her bilateral lower legs. We put her in 3 layer compression and she comes in today with all the wounds closed and considerable improvement in the condition of her lower legs. The patient is going to need lifelong compression stockings probably  juxta lites and we  are going to go ahead and order those for her today. Unfortunately the POA here is a niece in [place] were probably going to have to talk with her to arrange proper payment for the juxta lite stockings. She is in the assisted living level of [facility]. She does not feel she can get the stockings on independently I am hopeful that the nurse and aids at the assisted living can help her with this otherwise this may be a problem. She also has an area on her left buttock which is a stage II wound. A lot of unhealthy irritated skin around this I think from moisture prolonged sittingo Incontinence 11/17; the patient's area on the left buttock is just about closed although there is still a lot of unhealthy looking discolored skin around a large area here. I suspect this is chronic moisture, pressure, perhaps incontinence. she has minuscule open areas on her bilateral lower legs. Apparently our intermediary did not get the order for juxta lite stockings therefore she does not have any. 12/1; both leg wounds are healed and she has been wearing bilateral juxta lite stockings for 2 days. [facility] are assisting her with this. The left buttock wound unfortunately has deteriorated 12/15; 2-week follow-up. Her legs remain healed she has the other area which is on her left buttock. This is surrounded circumferentially by chronically irritated skin. Mirror image on the right side as well. I suspect this is chronic pressure friction and moisture from incontinence. This makes it difficult to get this area to completely close. We have been using silver alginate 12/29 both her legs remain closed and she has bilateral juxta lite stockings. The real problem here is her left buttock she has a small wound in the mid part of a extremely irritated dermatitis. It almost looks bruised in spots. There is superficial loss of epithelium. The actual wound that we have been dealing  with does not look so bad. I have been assuming this is some combination of friction pressure wetness from constant urinary incontinence. If there is a dermatologic issue outside of that here I am not seeing it 09/03/2021; patient with a small area on the left buttock. The real problem here is a marked surrounding erythematous dermatitis with loss of surface epithelium. It looks as though she is beginning to develop satellite lesions this may be mostly fungal 1/26; the area on her left buttock is closed however she still has very irritated macerated skin in this area. Some of this appears fungal. I treated this with ketoconazole last time and I think it looks somewhat better although there is still some area present that still looks like a fungal dermatitis. Everything on the right buttock looks a lot better. READMISSION 12/28/2021: The patient returns today with a reopening of a wound on her left buttock. She says it has been present for about 6 months; I am not sure how this is possible since she was seen for similar in January and subsequently discharged from the clinic. Nonetheless, she has some purplish discoloration of her buttock with an open area and some scarring around the perimeter of a shallow stage II ulcer on her buttock. According to the previous clinic notes, there was some question as to whether or not this was due to fungal breakdown of her skin. She currently resides at [facility], in the assisted living section. She says that she cannot see the site and is unable to care for it herself. 01/11/2022: The  wound is a little bit smaller today but has a very friable surface. It is an odd wound. On further inspection, it is almost as if there are dilated vessels and the underlying skin as I can compress the surrounding tissue, it dents in, and then fills back up, as if with fluid or blood. She did have an episode during the week when she had a lot of blood come from the wound, but is not  actively bleeding today. 01/25/2022: No significant change to the wound. She continues to have purple discoloration of her buttocks, but it does still blanch. 02/08/2022: The wound was measured at slightly larger today, but to my inspection it appears about the same. It is most unusual and certainly not consistent with a typical pressure ulcer. I think it is potentially related to what ever is causing the purplish discoloration of her buttocks. Remains friable and bleeds easily with minimal manipulation. 02/22/2022: No real difference today. The base is clean without any accumulation of slough or hypertrophic granulation tissue. I still think this may be a dermatological issue such as a venous lake or some variant of telangiectasia. 03/15/2022: The wound is unchanged. It bled fairly profusely when her dressing was removed. She has not yet received an appointment with dermatology. 04/09/2022: The orifice of the wound is smaller but the exposed tissue is still quite friable. She has an appointment with dermatology on August 22. Katrina [name], Katrina [name] ([id number]) [id number] Page 6 of 9 05/04/2022: She had a biopsy done by dermatology on August 22. We do not have the results. The patient reports she was told it was "staph" and prescribed clindamycin. She took her last capsule yesterday. T oday, the wound is basically unchanged. The surface is a little bit more flush with the surrounding tissue but it remains quite friable. There is some slough accumulation on the surface. 06/01/2022: The opening in the wound has contracted considerably. The tissues are still extremely friable and bleed with minimal manipulation. 07/02/2022: No real change to the wound, but the open area is less friable. 07/30/2022: The open portion of the wound is quite a bit smaller and the tissue is less friable. The patient reports that she has not been experiencing much bleeding at all at home. Patient History Information  obtained from Patient. Family History Cancer - Siblings, Heart Disease - Mother,Father, Hypertension - Father, No family history of Hereditary Spherocytosis, Kidney Disease, Lung Disease, Seizures, Stroke, Thyroid Problems, Tuberculosis. Social History Never smoker, Marital Status - Single, Alcohol Use - Never, Drug Use - No History, Caffeine Use - Rarely. Medical History Eyes Denies history of Cataracts, Glaucoma, Optic Neuritis Ear/Nose/Mouth/Throat Denies history of Chronic sinus problems/congestion, Middle ear problems Hematologic/Lymphatic Denies history of Anemia, Hemophilia, Human Immunodeficiency Virus, Lymphedema, Sickle Cell Disease Respiratory Patient has history of Sleep Apnea - does not use Cpap or 02 as ordered Denies history of Aspiration, Asthma, Chronic Obstructive Pulmonary Disease (COPD), Pneumothorax, Tuberculosis Cardiovascular Patient has history of Arrhythmia - A-Fib, Hypertension, Phlebitis Denies history of Angina, Congestive Heart Failure, Coronary Artery Disease, Deep Vein Thrombosis, Hypotension, Myocardial Infarction, Peripheral Arterial Disease, Peripheral Venous Disease, Vasculitis Gastrointestinal Denies history of Cirrhosis , Colitis, Crohnoos, Hepatitis A, Hepatitis B, Hepatitis C Endocrine Denies history of Type I Diabetes, Type II Diabetes Genitourinary Denies history of End Stage Renal Disease Immunological Denies history of Lupus Erythematosus, Raynaudoos, Scleroderma Integumentary (Skin) Denies history of History of Burn Musculoskeletal Denies history of Gout, Rheumatoid Arthritis, Osteoarthritis, Osteomyelitis Neurologic Denies history of Dementia, Neuropathy, Quadriplegia, Paraplegia,  Seizure Disorder Oncologic Denies history of Received Chemotherapy, Received Radiation Psychiatric Denies history of Anorexia/bulimia, Confinement Anxiety Medical A Surgical History  Notes nd Cardiovascular Hyperlipidemia Endocrine Hypothyroidism Objective Constitutional She is hypertensive, but asymptomatic.Marland Kitchen No acute distress. Vitals Time Taken: 1:25 PM, Height: 61 in, Weight: 195 lbs, BMI: 36.8, Temperature: 98.7 F, Pulse: 64 bpm, Respiratory Rate: 18 breaths/min, Blood Pressure: 153/64 mmHg. Respiratory Normal work of breathing on room air. General Notes: 07/30/2022: The open portion of the wound is quite a bit smaller and the tissue is less friable. Integumentary (Hair, Skin) Wound #14 status is Open. Original cause of wound was Pressure Injury. The date acquired was: 08/23/2021. The wound has been in treatment 30 weeks. The wound is located on the Left Gluteus. The wound measures 0.2cm length x 0.4cm width x 0.2cm depth; 0.063cm^2 area and 0.013cm^3 volume. There is Fat Layer (Subcutaneous Tissue) exposed. There is no tunneling or undermining noted. There is a medium amount of sanguinous drainage noted. The wound margin Przybysz, Katrina [name] ([id number]) [id number] Page 7 of 9 is epibole. There is large (67-100%) red, friable granulation within the wound bed. There is a small (1-33%) amount of necrotic tissue within the wound bed including Adherent Slough. The periwound skin appearance had no abnormalities noted for moisture. The periwound skin appearance exhibited: Scarring. The periwound skin appearance did not exhibit: Callus, Crepitus, Excoriation, Induration, Rash, Atrophie Blanche, Cyanosis, Ecchymosis, Hemosiderin Staining, Mottled, Pallor, Rubor, Erythema. Periwound temperature was noted as No Abnormality. Assessment Active Problems ICD-10 Pressure ulcer of left buttock, stage 2 Venous insufficiency (chronic) (peripheral) Essential (primary) hypertension Plan Follow-up Appointments: Return appointment in 1 month. - Dr. Celine Ahr RM 1 Anesthetic: Wound #14 Left Gluteus: (In clinic) Topical Lidocaine 4% applied to wound bed Bathing/  Shower/ Hygiene: May shower and wash wound with soap and water. Off-Loading: Turn and reposition every 2 hours - stand for a few minutes at least every hour during the day WOUND #14: - Gluteus Wound Laterality: Left Cleanser: Soap and Water 1 x Per Day/30 Days Discharge Instructions: May shower and wash wound with dial antibacterial soap and water prior to dressing change. Prim Dressing: KerraCel Ag Gelling Fiber Dressing, 2x2 in (silver alginate) 1 x Per Day/30 Days ary Discharge Instructions: Apply silver alginate to wound bed as instructed Secondary Dressing: ALLEVYN Gentle Border, 4x4 (in/in) 1 x Per Day/30 Days Discharge Instructions: Apply over primary dressing as directed. 07/30/2022: The open portion of the wound is quite a bit smaller and the tissue is less friable. No debridement was necessary today. We will continue silver alginate and a foam border dressing. Follow-up in 1 month. Electronic Signature(s) Signed: 07/30/2022 2:00:23 PM By: Fredirick Maudlin MD FACS Entered By: Fredirick Maudlin on 07/30/2022 14:00:22 -------------------------------------------------------------------------------- HxROS Details Patient Name: Date of Service: Katrina Amel [name]. 07/30/2022 1:15 PM Medical Record Number: [id number] Patient Account Number: [id number] Date of Birth/Sex: Treating RN: 27-Jan-1932 (88 y.o. F) Primary Care Provider: [name] Other Clinician: Referring Provider: Treating Provider/Extender: Kathlyn Sacramento in Treatment: 5 Information Obtained From Patient Eyes Medical History: Negative for: Cataracts; Glaucoma; Optic Neuritis Ear/Nose/Mouth/Throat Katrina [name], Katrina [name] ([id number]) [id number] Page 8 of 9 Medical History: Negative for: Chronic sinus problems/congestion; Middle ear problems Hematologic/Lymphatic Medical History: Negative for: Anemia; Hemophilia; Human Immunodeficiency Virus; Lymphedema; Sickle Cell  Disease Respiratory Medical History: Positive for: Sleep Apnea - does not use Cpap or 02 as ordered Negative for: Aspiration; Asthma; Chronic Obstructive Pulmonary Disease (COPD); Pneumothorax; Tuberculosis Cardiovascular Medical History: Positive for: Arrhythmia -  A-Fib; Hypertension; Phlebitis Negative for: Angina; Congestive Heart Failure; Coronary Artery Disease; Deep Vein Thrombosis; Hypotension; Myocardial Infarction; Peripheral Arterial Disease; Peripheral Venous Disease; Vasculitis Past Medical History Notes: Hyperlipidemia Gastrointestinal Medical History: Negative for: Cirrhosis ; Colitis; Crohns; Hepatitis A; Hepatitis B; Hepatitis C Endocrine Medical History: Negative for: Type I Diabetes; Type II Diabetes Past Medical History Notes: Hypothyroidism Genitourinary Medical History: Negative for: End Stage Renal Disease Immunological Medical History: Negative for: Lupus Erythematosus; Raynauds; Scleroderma Integumentary (Skin) Medical History: Negative for: History of Burn Musculoskeletal Medical History: Negative for: Gout; Rheumatoid Arthritis; Osteoarthritis; Osteomyelitis Neurologic Medical History: Negative for: Dementia; Neuropathy; Quadriplegia; Paraplegia; Seizure Disorder Oncologic Medical History: Negative for: Received Chemotherapy; Received Radiation Psychiatric Medical History: Negative for: Anorexia/bulimia; Confinement Anxiety Immunizations Pneumococcal Vaccine: Received Pneumococcal Vaccination: Yes Received Pneumococcal Vaccination On or After 60th Birthday: No Implantable Devices None Katrina [name], Katrina [name] ([id number]) [id number] Page 9 of 9 Family and Social History Cancer: Yes - Siblings; Heart Disease: Yes - Mother,Father; Hereditary Spherocytosis: No; Hypertension: Yes - Father; Kidney Disease: No; Lung Disease: No; Seizures: No; Stroke: No; Thyroid Problems: No; Tuberculosis: No; Never smoker; Marital Status - Single;  Alcohol Use: Never; Drug Use: No History; Caffeine Use: Rarely; Financial Concerns: No; Food, Clothing or Shelter Needs: No; Support System Lacking: No; Transportation Concerns: No Electronic Signature(s) Signed: 07/30/2022 2:01:18 PM By: Fredirick Maudlin MD FACS Entered By: Fredirick Maudlin on 07/30/2022 13:49:21 -------------------------------------------------------------------------------- SuperBill Details Patient Name: Date of Service: Katrina Amel [name]. 07/30/2022 Medical Record Number: [id number] Patient Account Number: [id number] Date of Birth/Sex: Treating RN: 1931-09-23 (88 y.o. F) Primary Care Provider: [name] Other Clinician: Referring Provider: Treating Provider/Extender: Kathlyn Sacramento in Treatment: 30 Diagnosis Coding ICD-10 Codes Code Description [id number] Pressure ulcer of left buttock, stage 2 I87.2 Venous insufficiency (chronic) (peripheral) I10 Essential (primary) hypertension Facility Procedures : CPT4 Code: [id number] Description: 99213 - WOUND CARE VISIT-LEV 3 EST PT Modifier: Quantity: 1 Physician Procedures : CPT4 Code Description Modifier [id number] - WC PHYS LEVEL 3 - EST PT ICD-10 Diagnosis Description L89.322 Pressure ulcer of left buttock, stage 2 I87.2 Venous insufficiency (chronic) (peripheral) I10 Essential (primary) hypertension Quantity: 1 Electronic Signature(s) Signed: 07/30/2022 2:27:02 PM By: Dellie Catholic RN Signed: 07/30/2022 2:30:25 PM By: Fredirick Maudlin MD FACS Previous Signature: 07/30/2022 2:00:38 PM Version By: Fredirick Maudlin MD FACS Entered By: Dellie Catholic on 07/30/2022 14:21:27

## 2022-07-31 NOTE — Progress Notes (Addendum)
Katrina [name] ([id number]) [id number] Page 1 of 9 Visit Report for 07/30/2022 Arrival Information Details Patient Name: Date of Service: Katrina [name], Katrina [name]. 07/30/2022 1:15 PM Medical Record Number: [id number] Patient Account Number: [id number] Date of Birth/Sex: Treating RN: June 08, 1932 (88 y.o. Katrina [name] Primary Care Sandra Brents: [name] Other Clinician: Referring Jarika Robben: Treating Markeem Noreen/Extender: Kathlyn [place] in Treatment: 66 Visit Information History Since Last Visit Added or deleted any medications: No Patient Arrived: Katrina [name] Any new allergies or adverse reactions: No Arrival Time: 13:25 Had a fall or experienced change in No Accompanied By: self activities of daily living that may affect Transfer Assistance: None risk of falls: Patient Identification Verified: Yes Signs or symptoms of abuse/neglect since last visito No Patient Requires Transmission-Based Precautions: No Hospitalized since last visit: No Patient Has Alerts: No Implantable device outside of the clinic excluding No cellular tissue based products placed in the center since last visit: Has Dressing in Place as Prescribed: Yes Pain Present Now: No Electronic Signature(s) Signed: 07/30/2022 2:27:02 PM By: Dellie Catholic RN Entered By: Dellie Catholic on 07/30/2022 13:25:56 -------------------------------------------------------------------------------- Clinic Level of Care Assessment Details Patient Name: Date of Service: Katrina [name] 07/30/2022 1:15 PM Medical Record Number: [id number] Patient Account Number: [id number] Date of Birth/Sex: Treating RN: 11-07-1931 (88 y.o. Katrina [name] Primary Care Dovid Bartko: [name] Other Clinician: Referring Flemon Kelty: Treating Keimani Laufer/Extender: Kathlyn [place] in Treatment: 30 Clinic Level of Care Assessment Items TOOL 4 Quantity Score X- 1 0 Use when only an EandM is  performed on FOLLOW-UP visit ASSESSMENTS - Nursing Assessment / Reassessment X- 1 10 Reassessment of Co-morbidities (includes updates in patient status) X- 1 5 Reassessment of Adherence to Treatment Plan ASSESSMENTS - Wound and Skin A ssessment / Reassessment X - Simple Wound Assessment / Reassessment - one wound 1 5 _0  - 0 Complex Wound Assessment / Reassessment - multiple wounds _1  - 0 Dermatologic / Skin Assessment (not related to wound area) ASSESSMENTS - Focused Assessment _2  - 0 Circumferential Edema Measurements - multi extremities _3  - 0 Nutritional Assessment / Counseling / Intervention [name], Katrina Chamber ([id number]) [id number] Page 2 of 9 _4  - 0 Lower Extremity Assessment (monofilament, tuning fork, pulses) _5  - 0 Peripheral Arterial Disease Assessment (using hand held doppler) ASSESSMENTS - Ostomy and/or Continence Assessment and Care _6  - 0 Incontinence Assessment and Management _7  - 0 Ostomy Care Assessment and Management (repouching, etc.) PROCESS - Coordination of Care X - Simple Patient / Family Education for ongoing care 1 15 _8  - 0 Complex (extensive) Patient / Family Education for ongoing care X- 1 10 Staff obtains Programmer, systems, Records, T Results / Process Orders est X- 1 10 Staff telephones HHA, Nursing Homes / Clarify orders / etc _9  - 0 Routine Transfer to another Facility (non-emergent condition) _10  - 0 Routine Hospital Admission (non-emergent condition) _11  - 0 New Admissions / Biomedical engineer / Ordering NPWT Apligraf, etc. , _12  - 0 Emergency Hospital Admission (emergent condition) X- 1 10 Simple Discharge Coordination _13  - 0 Complex (extensive) Discharge Coordination PROCESS - Special Needs _14  - 0 Pediatric / Minor Patient Management _15  - 0 Isolation Patient Management _16  - 0 Hearing / Language / Visual special needs _17  - 0 Assessment of Community assistance (transportation, [name]/C planning, etc.) _18  -  0 Additional assistance / Altered mentation _19  - 0 Support Surface(s) Assessment (bed, cushion, seat, etc.) INTERVENTIONS - Wound Cleansing / Measurement X - Simple Wound Cleansing - one wound 1 5 _20  -  0 Complex Wound Cleansing - multiple wounds X- 1 5 Wound Imaging (photographs - any number of wounds) _0  - 0 Wound Tracing (instead of photographs) X- 1 5 Simple Wound Measurement - one wound _1  - 0 Complex Wound Measurement - multiple wounds INTERVENTIONS - Wound Dressings _2  - 0 Small Wound Dressing one or multiple wounds _3  - 0 Medium Wound Dressing one or multiple wounds _4  - 0 Large Wound Dressing one or multiple wounds <QBHALPFXTKWIOXBD>_5<\/HGDJMEQASTMHDQQI>_2  - 0 Application of Medications - topical <LNLGXQJJHERDEYCX>_4<\/GYJEHUDJSHFWYOVZ>_8  - 0 Application of Medications - injection INTERVENTIONS - Miscellaneous _7  - 0 External ear exam _8  - 0 Specimen Collection (cultures, biopsies, blood, body fluids, etc.) _9  - 0 Specimen(s) / Culture(s) sent or taken to Lab for analysis _10  - 0 Patient Transfer (multiple staff / Civil Service fast streamer / Similar devices) _11  - 0 Simple Staple / Suture removal (25 or less) _12  - 0 Complex Staple / Suture removal (26 or more) _13  - 0 Hypo / Hyperglycemic Management (close monitor of Blood Glucose) Bayly, Katrina [name] ([id number]) [id number] Page 3 of 9 _14  - 0 Ankle / Brachial Index (ABI) - do not check if billed separately X- 1 5 Vital Signs Has the patient been seen at the hospital within the last three years: Yes Total Score: 85 Level Of Care: New/Established - Level 3 Electronic Signature(s) Signed: 07/30/2022 2:27:02 PM By: Dellie Catholic RN Entered By: Dellie Catholic on 07/30/2022 14:21:37 -------------------------------------------------------------------------------- Complex / Palliative Patient Assessment Details Patient Name: Date of Service: Katrina Amel [name]. 07/30/2022 1:15 PM Medical Record Number: [id number] Patient Account Number: [id number] Date of Birth/Sex: Treating RN: November 30, 1931  (88 y.o. [name] Primary Care Shelton Soler: [name] Other Clinician: Referring Lindee Leason: Treating Jacinda Kanady/Extender: Kathlyn [place] in Treatment: 30 Complex Wound Management Criteria Patient has remarkable or complex co-morbidities requiring medications or treatments that extend wound healing times. Examples: Diabetes mellitus with chronic renal failure or end stage renal disease requiring dialysis Advanced or poorly controlled rheumatoid arthritis Diabetes mellitus and end stage chronic obstructive pulmonary disease Active cancer with current chemo- or radiation therapy HTN, A. Fib, phlebitis, sleep apnea, hypothroidism, obesity Palliative Wound Management Criteria Care Approach Wound Care Plan: Complex Wound Management Electronic Signature(s) Signed: 08/20/2022 8:38:27 PM By: Deon Pilling RN, BSN Signed: 08/30/2022 2:25:03 PM By: Fredirick Maudlin MD FACS Entered By: Deon Pilling on 08/20/2022 20:38:26 -------------------------------------------------------------------------------- Encounter Discharge Information Details Patient Name: Date of Service: Katrina Amel [name]. 07/30/2022 1:15 PM Medical Record Number: [id number] Patient Account Number: [id number] Date of Birth/Sex: Treating RN: [birth year] (88 y.o. Katrina [name] Primary Care Fortune Torosian: [name] Other Clinician: Referring Margi Edmundson: Treating Mylynn Dinh/Extender: Kathlyn [place] in Treatment: 30 Encounter Discharge Information Items Discharge Condition: Stable Ambulatory Status: Walker Discharge Destination: Home Transportation: Private Auto Accompanied By: self Schedule Follow-up Appointment: Yes Clinical Summary of Care: Patient Declined Electronic Signature(s) Signed: 07/30/2022 2:27:02 PM By: Dellie Catholic RN Tobie Poet, Katrina [name] ([id number]) [id number] Page 4 of 9 Signed: 07/30/2022 2:27:02 PM By: Dellie Catholic RN Entered By:  Dellie Catholic on 07/30/2022 14:22:10 -------------------------------------------------------------------------------- Lower Extremity Assessment Details Patient Name: Date of Service: Katrina [name], Katrina [name] 07/30/2022 1:15 PM Medical Record Number: [id number] Patient Account Number: [id number] Date of Birth/Sex: Treating RN: 1931-11-12 (88 y.o. Katrina [name] Primary Care Raylee Adamec: [name] Other Clinician: Referring Cayenne Breault: Treating Chett Taniguchi/Extender: Kathlyn [place] in Treatment: 30 Electronic Signature(s) Signed: 07/30/2022 2:27:02 PM By: Dellie Catholic RN Entered By: Dellie Catholic on 07/30/2022 13:26:53 -------------------------------------------------------------------------------- Multi Wound Chart  Details Patient Name: Date of Service: Katrina [name], Katrina [name] 07/30/2022 1:15 PM Medical Record Number: [id number] Patient Account Number: [id number] Date of Birth/Sex: Treating RN: 19-Oct-1931 (88 y.o. F) Primary Care Meeyah Ovitt: [name] Other Clinician: Referring Graeme Menees: Treating Abid Bolla/Extender: Kathlyn [place] in Treatment: 30 Vital Signs Height(in): 61 Pulse(bpm): 64 Weight(lbs): 195 Blood Pressure(mmHg): 153/64 Body Mass Index(BMI): 36.8 Temperature(F): 98.7 Respiratory Rate(breaths/min): 18 [14:Photos:] [N/A:N/A] Left Gluteus N/A N/A Wound Location: Pressure Injury N/A N/A Wounding Event: Pressure Ulcer N/A N/A Primary Etiology: Sleep Apnea, Arrhythmia, N/A N/A Comorbid History: Hypertension, Phlebitis 08/23/2021 N/A N/A Date Acquired: 30 N/A N/A Weeks of Treatment: Open N/A N/A Wound Status: No N/A N/A Wound Recurrence: 0.2x0.4x0.2 N/A N/A Measurements L x W x [name] (cm) 0.063 N/A N/A A (cm) : rea 0.013 N/A N/A Volume (cm) : 95.00% N/A N/A % Reduction in A rea: 94.80% N/A N/A % Reduction in Volume: Category/Stage III N/A N/A Classification: Medium N/A N/A Exudate A mount: Sanguinous N/A  N/A Exudate Type: Schlabach, Katrina [name] ([id number]) [id number] Page 5 of 9 red N/A N/A Exudate Color: Epibole N/A N/A Wound Margin: Large (67-100%) N/A N/A Granulation Amount: Red, Friable N/A N/A Granulation Quality: Small (1-33%) N/A N/A Necrotic Amount: Fat Layer (Subcutaneous Tissue): Yes N/A N/A Exposed Structures: Fascia: No Tendon: No Muscle: No Joint: No Bone: No None N/A N/A Epithelialization: Scarring: Yes N/A N/A Periwound Skin Texture: Excoriation: No Induration: No Callus: No Crepitus: No Rash: No Maceration: No N/A N/A Periwound Skin Moisture: Dry/Scaly: No Atrophie Blanche: No N/A N/A Periwound Skin Color: Cyanosis: No Ecchymosis: No Erythema: No Hemosiderin Staining: No Mottled: No Pallor: No Rubor: No No Abnormality N/A N/A Temperature: Treatment Notes Electronic Signature(s) Signed: 07/30/2022 1:45:30 PM By: Fredirick Maudlin MD FACS Entered By: Fredirick Maudlin on 07/30/2022 13:45:29 -------------------------------------------------------------------------------- Multi-Disciplinary Care Plan Details Patient Name: Date of Service: Katrina Amel [name]. 07/30/2022 1:15 PM Medical Record Number: [id number] Patient Account Number: [id number] Date of Birth/Sex: Treating RN: [birth year] (88 y.o. Katrina [name] Primary Care Brance Dartt: [name] Other Clinician: Referring Vianney Kopecky: Treating Tyreesha Maharaj/Extender: Kathlyn [place] in Treatment: [place] reviewed with physician Active Inactive Wound/Skin Impairment Nursing Diagnoses: Impaired tissue integrity Knowledge deficit related to ulceration/compromised skin integrity Goals: Patient/caregiver will verbalize understanding of skin care regimen Date Initiated: 12/28/2021 Target Resolution Date: 10/21/2022 Goal Status: Active Ulcer/skin breakdown will have a volume reduction of 30% by week 4 Date Initiated: 12/28/2021 Date  Inactivated: 01/25/2022 Target Resolution Date: 01/25/2022 Goal Status: Met Ulcer/skin breakdown will have a volume reduction of 50% by week 8 Date Initiated: 01/25/2022 Date Inactivated: 02/22/2022 Target Resolution Date: 02/22/2022 Unmet Reason: atypical wound, derm Goal Status: Unmet consult Interventions: Assess patient/caregiver ability to obtain necessary supplies Plaugher, Katrina [name] ([id number]) [id number] Page 6 of 9 Assess patient/caregiver ability to perform ulcer/skin care regimen upon admission and as needed Assess ulceration(s) every visit Provide education on ulcer and skin care Treatment Activities: Skin care regimen initiated : 12/28/2021 Topical wound management initiated : 12/28/2021 Notes: Electronic Signature(s) Signed: 07/30/2022 2:27:02 PM By: Dellie Catholic RN Entered By: Dellie Catholic on 07/30/2022 13:41:42 -------------------------------------------------------------------------------- Pain Assessment Details Patient Name: Date of Service: Katrina Amel [name]. 07/30/2022 1:15 PM Medical Record Number: [id number] Patient Account Number: [id number] Date of Birth/Sex: Treating RN: June 17, 1932 (88 y.o. Katrina [name] Primary Care Axcel Horsch: [name] Other Clinician: Referring Ruari Duggan: Treating Tobey Schmelzle/Extender: Kathlyn [place] in Treatment: 30 Active Problems Location of Pain Severity and Description of Pain Patient Has  Paino No Site Locations Pain Management and Medication Current Pain Management: Electronic Signature(s) Signed: 07/30/2022 2:27:02 PM By: Dellie Catholic RN Entered By: Dellie Catholic on 07/30/2022 13:26:44 -------------------------------------------------------------------------------- Patient/Caregiver Education Details Patient Name: Date of Service: Katrina Amel [name]. 12/8/2023andnbsp1:15 PM Medical Record Number: [id number] Patient Account Number: [id number] Ruesch, Katrina [name] ([id number])  [id number] Page 7 of 9 Date of Birth/Gender: Treating RN: [birth year] (88 y.o. Katrina [name] Primary Care Physician: [name] Other Clinician: Referring Physician: Treating Physician/Extender: Kathlyn [place] in Treatment: 30 Education Assessment Education Provided To: Patient Education Topics Provided Wound/Skin Impairment: Methods: Explain/Verbal Responses: Return demonstration correctly Electronic Signature(s) Signed: 07/30/2022 2:27:02 PM By: Dellie Catholic RN Entered By: Dellie Catholic on 07/30/2022 14:19:40 -------------------------------------------------------------------------------- Wound Assessment Details Patient Name: Date of Service: Katrina Amel [name]. 07/30/2022 1:15 PM Medical Record Number: [id number] Patient Account Number: [id number] Date of Birth/Sex: Treating RN: [birth year] (88 y.o. Katrina [name] Primary Care Nazariah Cadet: [name] Other Clinician: Referring Brittney Caraway: Treating Sanders Manninen/Extender: Kathlyn [place] in Treatment: 30 Wound Status Wound Number: 14 Primary Etiology: Pressure Ulcer Wound Location: Left Gluteus Wound Status: Open Wounding Event: Pressure Injury Comorbid History: Sleep Apnea, Arrhythmia, Hypertension, Phlebitis Date Acquired: 08/23/2021 Weeks Of Treatment: 30 Clustered Wound: No Photos Wound Measurements Length: (cm) 0.2 Width: (cm) 0.4 Depth: (cm) 0.2 Area: (cm) 0.063 Volume: (cm) 0.013 % Reduction in Area: 95% % Reduction in Volume: 94.8% Epithelialization: None Tunneling: No Undermining: No Wound Description Classification: Category/Stage III Wound Margin: Epibole Exudate Amount: Medium Exudate Type: Sanguinous Exudate Color: red Wahlert, Jamoni [name] ([id number]) Foul Odor After Cleansing: No Slough/Fibrino No [id number] Page 8 of 9 Wound Bed Granulation Amount: Large (67-100%) Exposed  Structure Granulation Quality: Red, Friable Fascia Exposed: No Necrotic Amount: Small (1-33%) Fat Layer (Subcutaneous Tissue) Exposed: Yes Necrotic Quality: Adherent Slough Tendon Exposed: No Muscle Exposed: No Joint Exposed: No Bone Exposed: No Periwound Skin Texture Texture Color No Abnormalities Noted: No No Abnormalities Noted: No Callus: No Atrophie Blanche: No Crepitus: No Cyanosis: No Excoriation: No Ecchymosis: No Induration: No Erythema: No Rash: No Hemosiderin Staining: No Scarring: Yes Mottled: No Pallor: No Moisture Rubor: No No Abnormalities Noted: Yes Temperature / Pain Temperature: No Abnormality Treatment Notes Wound #14 (Gluteus) Wound Laterality: Left Cleanser Soap and Water Discharge Instruction: May shower and wash wound with dial antibacterial soap and water prior to dressing change. Peri-Wound Care Topical Primary Dressing KerraCel Ag Gelling Fiber Dressing, 2x2 in (silver alginate) Discharge Instruction: Apply silver alginate to wound bed as instructed Secondary Dressing ALLEVYN Gentle Border, 4x4 (in/in) Discharge Instruction: Apply over primary dressing as directed. Secured With Compression Wrap Compression Stockings Add-Ons Electronic Signature(s) Signed: 07/30/2022 2:27:02 PM By: Dellie Catholic RN Entered By: Dellie Catholic on 07/30/2022 13:35:53 -------------------------------------------------------------------------------- Vitals Details Patient Name: Date of Service: Katrina Amel [name]. 07/30/2022 1:15 PM Medical Record Number: [id number] Patient Account Number: [id number] Date of Birth/Sex: Treating RN: Oct 25, 1931 (88 y.o. Katrina [name] Primary Care Dawan Farney: [name] Other Clinician: Referring Italia Wolfert: Treating Yaritzi Craun/Extender: Kathlyn [place] in Treatment: 30 Vital Signs Time Taken: 13:25 Temperature (F): 98.7 Klatt, Katrina [name] ([id number]) [id number] Page 9 of  9 Height (in): 61 Pulse (bpm): 64 Weight (lbs): 195 Respiratory Rate (breaths/min): 18 Body Mass Index (BMI): 36.8 Blood Pressure (mmHg): 153/64 Reference Range: 80 - 120 mg / dl Electronic Signature(s) Signed: 07/30/2022 2:27:02 PM By: Dellie Catholic RN Entered By: Dellie Catholic on 07/30/2022 13:26:37

## 2022-09-10 ENCOUNTER — Encounter (HOSPITAL_BASED_OUTPATIENT_CLINIC_OR_DEPARTMENT_OTHER): Payer: Medicare Other | Attending: General Surgery | Admitting: General Surgery

## 2022-09-10 DIAGNOSIS — Z96659 Presence of unspecified artificial knee joint: Secondary | ICD-10-CM | POA: Diagnosis not present

## 2022-09-10 DIAGNOSIS — I480 Paroxysmal atrial fibrillation: Secondary | ICD-10-CM | POA: Insufficient documentation

## 2022-09-10 DIAGNOSIS — I10 Essential (primary) hypertension: Secondary | ICD-10-CM | POA: Diagnosis not present

## 2022-09-10 DIAGNOSIS — G4730 Sleep apnea, unspecified: Secondary | ICD-10-CM | POA: Diagnosis not present

## 2022-09-10 DIAGNOSIS — I872 Venous insufficiency (chronic) (peripheral): Secondary | ICD-10-CM | POA: Diagnosis not present

## 2022-09-10 DIAGNOSIS — Z86711 Personal history of pulmonary embolism: Secondary | ICD-10-CM | POA: Diagnosis not present

## 2022-09-10 DIAGNOSIS — Z7901 Long term (current) use of anticoagulants: Secondary | ICD-10-CM | POA: Diagnosis not present

## 2022-09-10 DIAGNOSIS — L89322 Pressure ulcer of left buttock, stage 2: Secondary | ICD-10-CM | POA: Insufficient documentation

## 2022-09-10 NOTE — Progress Notes (Signed)
Katrina [name], Katrina [name] ([id number]) [id number] Page 1 of 6 Visit Report for 09/10/2022 Arrival Information Details Patient Name: Date of Service: Katrina [name]. 09/10/2022 1:15 PM Medical Record Number: [id number] Patient Account Number: [id number] Date of Birth/Sex: Treating RN: Jul 11, 1932 (89 y.o. America Brown Primary Care Kataryna Mcquilkin: [name] Other Clinician: Referring Jeslynn Hollander: Treating Rhythm Gubbels/Extender: Kathlyn [place] in Treatment: 36 Visit Information History Since Last Visit Added or deleted any medications: No Patient Arrived: Gilford Rile Any new allergies or adverse reactions: No Arrival Time: 13:31 Had a fall or experienced change in No Accompanied By: self activities of daily living that may affect Transfer Assistance: Manual risk of falls: Patient Requires Transmission-Based Precautions: No Signs or symptoms of abuse/neglect since last visito No Patient Has Alerts: No Hospitalized since last visit: No Implantable device outside of the clinic excluding No cellular tissue based products placed in the center since last visit: Pain Present Now: No Electronic Signature(s) Signed: 09/10/2022 4:15:50 PM By: Dellie Catholic RN Entered By: Dellie Catholic on 09/10/2022 13:31:35 -------------------------------------------------------------------------------- Encounter Discharge Information Details Patient Name: Date of Service: Katrina [name]. 09/10/2022 1:15 PM Medical Record Number: [id number] Patient Account Number: [id number] Date of Birth/Sex: Treating RN: [birth year] (89 y.o. America Brown Primary Care Calvin Jablonowski: [name] Other Clinician: Referring Ashaki Frosch: Treating Addisynn Vassell/Extender: Kathlyn Sacramento in Treatment: 36 Encounter Discharge Information Items Discharge Condition: Stable Ambulatory Status: Walker Discharge Destination: Other (Note Required) Telephoned: No Orders Sent:  Yes Transportation: Private Auto Accompanied By: self Schedule Follow-up Appointment: Yes Clinical Summary of Care: Patient Declined Notes Pt. is from [place]. Electronic Signature(s) Signed: 09/10/2022 4:15:50 PM By: Dellie Catholic RN Entered By: Dellie Catholic on 09/10/2022 14:33:31 Prestia, Katrina [name] ([id number]) [id number] Page 2 of 6 -------------------------------------------------------------------------------- Lower Extremity Assessment Details Patient Name: Date of Service: Katrina [name], Katrina [name] 09/10/2022 1:15 PM Medical Record Number: [id number] Patient Account Number: [id number] Date of Birth/Sex: Treating RN: 1932/02/28 (89 y.o. America Brown Primary Care Aviyanna Colbaugh: [name] Other Clinician: Referring Meredyth Hornung: Treating Virgilene Stryker/Extender: Kathlyn Sacramento in Treatment: 36 Electronic Signature(s) Signed: 09/10/2022 4:15:50 PM By: Dellie Catholic RN Entered By: Dellie Catholic on 09/10/2022 13:36:14 -------------------------------------------------------------------------------- Multi Wound Chart Details Patient Name: Date of Service: Katrina [name]. 09/10/2022 1:15 PM Medical Record Number: [id number] Patient Account Number: [id number] Date of Birth/Sex: Treating RN: Sep 08, 1931 (89 y.o. F) Primary Care Denaisha Swango: [name] Other Clinician: Referring Keyon Liller: Treating Rahul Malinak/Extender: Kathlyn Sacramento in Treatment: 36 Vital Signs Height(in): 61 Pulse(bpm): 70 Weight(lbs): 195 Blood Pressure(mmHg): 157/84 Body Mass Index(BMI): 36.8 Temperature(F): 97.8 Respiratory Rate(breaths/min): 18 [14:Photos:] [N/A:N/A] Left Gluteus N/A N/A Wound Location: Pressure Injury N/A N/A Wounding Event: Pressure Ulcer N/A N/A Primary Etiology: Sleep Apnea, Arrhythmia, N/A N/A Comorbid History: Hypertension, Phlebitis 08/23/2021 N/A N/A Date Acquired: 23 N/A N/A Weeks of Treatment: Open N/A  N/A Wound Status: No N/A N/A Wound Recurrence: 0.4x0.5x0.2 N/A N/A Measurements L x W x [name] (cm) 0.157 N/A N/A A (cm) : rea 0.031 N/A N/A Volume (cm) : 87.50% N/A N/A % Reduction in A rea: 87.60% N/A N/A % Reduction in Volume: Category/Stage III N/A N/A Classification: Medium N/A N/A Exudate A mount: Sanguinous N/A N/A Exudate Type: red N/A N/A Exudate Color: Epibole N/A N/A Wound Margin: Large (67-100%) N/A N/A Granulation A mount: Red, Friable N/A N/A Granulation Quality: Small (1-33%) N/A N/A Necrotic A mount: Douglas, Katrina [name] ([id number]) [id number] Page 3 of 6 Fat Layer (Subcutaneous Tissue): Yes N/A N/A Exposed Structures:  Fascia: No Tendon: No Muscle: No Joint: No Bone: No None N/A N/A Epithelialization: Scarring: Yes N/A N/A Periwound Skin Texture: Excoriation: No Induration: No Callus: No Crepitus: No Rash: No Maceration: No N/A N/A Periwound Skin Moisture: Dry/Scaly: No Atrophie Blanche: No N/A N/A Periwound Skin Color: Cyanosis: No Ecchymosis: No Erythema: No Hemosiderin Staining: No Mottled: No Pallor: No Rubor: No No Abnormality N/A N/A Temperature: Chemical Cauterization N/A N/A Procedures Performed: Treatment Notes Electronic Signature(s) Signed: 09/10/2022 2:28:06 PM By: Fredirick Maudlin MD FACS Entered By: Fredirick Maudlin on 09/10/2022 14:28:06 -------------------------------------------------------------------------------- Multi-Disciplinary Care Plan Details Patient Name: Date of Service: Katrina [name]. 09/10/2022 1:15 PM Medical Record Number: [id number] Patient Account Number: [id number] Date of Birth/Sex: Treating RN: January 27, 1932 (89 y.o. America Brown Primary Care Annina Piotrowski: [name] Other Clinician: Referring Ronen Bromwell: Treating Craigory Toste/Extender: Kathlyn [place] in Treatment: [place] reviewed with physician Active Inactive Electronic  Signature(s) Signed: 09/10/2022 4:15:50 PM By: Dellie Catholic RN Entered By: Dellie Catholic on 09/10/2022 15:59:44 -------------------------------------------------------------------------------- Pain Assessment Details Patient Name: Date of Service: Katrina [name]. 09/10/2022 1:15 PM Medical Record Number: [id number] Patient Account Number: [id number] Date of Birth/Sex: Treating RN: Apr 28, 1932 (89 y.o. America Brown Primary Care Ashyr Hedgepath: [name] Other Clinician: Referring Kable Haywood: Treating Addalynne Golding/Extender: Kathlyn [place] in Treatment: [address] [place], Katrina [name] ([id number]) [id number] Page 4 of 6 Location of Pain Severity and Description of Pain Patient Has Paino No Site Locations Pain Management and Medication Current Pain Management: Electronic Signature(s) Signed: 09/10/2022 4:15:50 PM By: Dellie Catholic RN Entered By: Dellie Catholic on 09/10/2022 13:36:06 -------------------------------------------------------------------------------- Patient/Caregiver Education Details Patient Name: Date of Service: Katrina [name]. 1/19/2024andnbsp1:15 PM Medical Record Number: [id number] Patient Account Number: [id number] Date of Birth/Gender: Treating RN: [birth year] (89 y.o. America Brown Primary Care Physician: [name] Other Clinician: Referring Physician: Treating Physician/Extender: Kathlyn Sacramento in Treatment: 36 Education Assessment Education Provided To: Patient Education Topics Provided Wound Debridement: Methods: Explain/Verbal Responses: Reinforcements needed, State content correctly Wound/Skin Impairment: Methods: Explain/Verbal Responses: Reinforcements needed, State content correctly Electronic Signature(s) Signed: 09/10/2022 4:15:50 PM By: Dellie Catholic RN Entered By: Dellie Catholic on 09/10/2022 13:47:59 [place], Katrina [name] ([id number])  [id number] Page 5 of 6 -------------------------------------------------------------------------------- Wound Assessment Details Patient Name: Date of Service: Katrina, [name] 09/10/2022 1:15 PM Medical Record Number: [id number] Patient Account Number: [id number] Date of Birth/Sex: Treating RN: 1932-01-30 (89 y.o. America Brown Primary Care Laylani Pudwill: [name] Other Clinician: Referring Raylynne Cubbage: Treating Adeoluwa Silvers/Extender: Kathlyn Sacramento in Treatment: 36 Wound Status Wound Number: 14 Primary Etiology: Pressure Ulcer Wound Location: Left Gluteus Wound Status: Healed - Epithelialized Wounding Event: Pressure Injury Comorbid History: Sleep Apnea, Arrhythmia, Hypertension, Phlebitis Date Acquired: 08/23/2021 Weeks Of Treatment: 36 Clustered Wound: No Photos Wound Measurements Length: (cm) Width: (cm) Depth: (cm) Area: (cm) Volume: (cm) 0 % Reduction in Area: 100% 0 % Reduction in Volume: 100% 0 Epithelialization: None 0 Tunneling: No 0 Undermining: No Wound Description Classification: Category/Stage III Wound Margin: Epibole Exudate Amount: None Present Foul Odor After Cleansing: No Slough/Fibrino No Wound Bed Granulation Amount: None Present (0%) Exposed Structure Necrotic Amount: None Present (0%) Fascia Exposed: No Fat Layer (Subcutaneous Tissue) Exposed: No Tendon Exposed: No Muscle Exposed: No Joint Exposed: No Bone Exposed: No Periwound Skin Texture Texture Color No Abnormalities Noted: Yes No Abnormalities Noted: Yes Moisture Temperature / Pain No Abnormalities Noted: Yes Temperature: No Abnormality Treatment Notes Wound #14 (Gluteus) Wound Laterality: Left  Cleanser Soap and Water Discharge Instruction: May shower and wash wound with dial antibacterial soap and water prior to dressing change. Peri-Wound Care Spiker, Malachy Chamber ([id number]) [id number] Page 6 of  6 Topical Primary Dressing KerraCel Ag Gelling Fiber Dressing, 2x2 in (silver alginate) Discharge Instruction: Apply silver alginate to wound bed as instructed Secondary Dressing ALLEVYN Gentle Border, 4x4 (in/in) Discharge Instruction: Apply over primary dressing as directed. Secured With Compression Wrap Compression Stockings Environmental education officer) Signed: 09/10/2022 4:15:50 PM By: Dellie Catholic RN Entered By: Dellie Catholic on 09/10/2022 16:01:16 -------------------------------------------------------------------------------- Vitals Details Patient Name: Date of Service: Katrina [name]. 09/10/2022 1:15 PM Medical Record Number: [id number] Patient Account Number: [id number] Date of Birth/Sex: Treating RN: 19-Oct-1931 (89 y.o. America Brown Primary Care Surafel Hilleary: [name] Other Clinician: Referring Berenice Oehlert: Treating Talmage Teaster/Extender: Kathlyn [place] in Treatment: 36 Vital Signs Time Taken: 13:34 Temperature (F): 97.8 Height (in): 61 Pulse (bpm): 70 Weight (lbs): 195 Respiratory Rate (breaths/min): 18 Body Mass Index (BMI): 36.8 Blood Pressure (mmHg): 157/84 Reference Range: 80 - 120 mg / dl Electronic Signature(s) Signed: 09/10/2022 4:15:50 PM By: Dellie Catholic RN Entered By: Dellie Catholic on 09/10/2022 13:35:58

## 2022-09-10 NOTE — Progress Notes (Signed)
Marten, Katrina [name] ([id number]) [id number] Page 1 of 10 Visit Report for 09/10/2022 Chief Complaint Document Details Patient Name: Date of Service: Katrina [name], Katrina [name]. 09/10/2022 1:15 PM Medical Record Number: [id number] Patient Account Number: [id number] Date of Birth/Sex: Treating RN: 03/10/1932 (89 y.o. F) Primary Care Provider: [name] Other Clinician: Referring Provider: Treating Provider/Extender: Kathlyn Sacramento in Treatment: 36 Information Obtained from: Patient Chief Complaint Patient returns to the wound care center for reopened ulcer to: Left buttock Electronic Signature(s) Signed: 09/10/2022 2:28:12 PM By: Fredirick Maudlin MD FACS Entered By: Fredirick Maudlin on 09/10/2022 14:28:12 -------------------------------------------------------------------------------- HPI Details Patient Name: Date of Service: Katrina Amel [name]. 09/10/2022 1:15 PM Medical Record Number: [id number] Patient Account Number: [id number] Date of Birth/Sex: Treating RN: 11/14/1931 (89 y.o. F) Primary Care Provider: [name] Other Clinician: Referring Provider: Treating Provider/Extender: Kathlyn Sacramento in Treatment: 36 History of Present Illness Location: right lower extremity Quality: Patient reports experiencing a dull pain to affected area(s). Severity: Patient states wound(s) are getting better. Duration: Patient has had the wound for 3 months prior to presenting for treatment Context: The wound would happen gradually Modifying Factors: Patient wound(s)/ulcer(s) are worsening due to :standing for a long while. HPI Description: Past medical history of pulmonary emboli, hypothyroidism, hyperlipidemia, hypertension, sleep apnea, gallstones, ulcerated lower extremities, atrial fibrillation, status post total knee replacement, cholecystectomy, cystoscopy and stent placement for kidney stones. She has never been a  smoker. About a year ago she was seen by [name] with an ulcer of her right lower extremity which she's had for about over 3 months. On 10/23/14 -- Lower extremity venous duplex evaluation. She had no evidence of deep venous thromboses involving both lower extremities. There was evidence of deep vein reflux in the right and left common femoral veins. She also had evidence of great saphenous vein reflux noted only at the level of the saphenofemoral junction. No evidence of greater or small saphenous vein reflux. She will need a consult to the vascular surgeon. Seen by Dr. Mathis Fare office note dated 11/13/2014. He noted that she had good arterial blood flow with palpable DP pulses bilaterally. Based on the patient's history Dr. Scot Dock recommended elevation when at rest of both feet and knees and ankles above heart level in the supine position. Activity such as walking and pool exercises were recommended. She will also have 15-20 mm thigh-high compression stockings daily once the ulcers have completely healed. A prescription was given to her. 11/04/2015 -- the wound on her right lower extremity has completely healed and her edema has gone down significantly. However in her popliteal fossa where she's got a lot of fungal infection there is an area which is now an open wound and this will need attention. 12/02/2015 -- the right lower extremity looks much better than the edema has gone down significantly however she continues to have significant edema on the left side today. 12/09/2015 -- the patient has been doing her dressings as advised and says that overall she feels much better. She has received her juxta light for the left lower extremity READMISSION 06/25/2021 This is a patient who is actually had several previous visits to our clinic in 2014, 2016 and more recently 2017. This was largely because of wounds on her lower legs in the setting of chronic venous insufficiency and secondary lymphedema. Looking  at my records she was apparently prescribed thigh-high Oblinger, [name] ([id number]) [id number] Page 2 of 10 compression stockings probably by vein and vascular although  she comes back in not having worn any stockings in quite a period of time. She is now in the assisted living part of [facility]. She thinks her wounds have been there since April. This includes the left lateral, right anterior and right posterior lateral parts of both lower legs. She also has a stage II pressure ulcer on her left glued I think which is happened because of sleeping in a lift chair. She had ABDs and kerlix on her bilateral lower legs. Past medical history reviewed she has hypothyroidism, left buttock pressure ulcer dating back to March of this year, iron deficiency, falls, paroxysmal atrial fibrillation anticoagulated and chronic venous insufficiency ABIs in our clinic were 0.86 on the right and 0.96 on the left 11/10; patient comes into clinic today after being admitted last week with uncontrolled lymphedema, severe venous insufficiency and some superficial wounds on her bilateral lower legs. We put her in 3 layer compression and she comes in today with all the wounds closed and considerable improvement in the condition of her lower legs. The patient is going to need lifelong compression stockings probably juxta lites and we are going to go ahead and order those for her today. Unfortunately the POA here is a niece in [place] were probably going to have to talk with her to arrange proper payment for the juxta lite stockings. She is in the assisted living level of [facility]. She does not feel she can get the stockings on independently I am hopeful that the nurse and aids at the assisted living can help her with this otherwise this may be a problem. She also has an area on her left buttock which is a stage II wound. A lot of unhealthy irritated skin  around this I think from moisture prolonged sittingo Incontinence 11/17; the patient's area on the left buttock is just about closed although there is still a lot of unhealthy looking discolored skin around a large area here. I suspect this is chronic moisture, pressure, perhaps incontinence. she has minuscule open areas on her bilateral lower legs. Apparently our intermediary did not get the order for juxta lite stockings therefore she does not have any. 12/1; both leg wounds are healed and she has been wearing bilateral juxta lite stockings for 2 days. [facility] are assisting her with this. The left buttock wound unfortunately has deteriorated 12/15; 2-week follow-up. Her legs remain healed she has the other area which is on her left buttock. This is surrounded circumferentially by chronically irritated skin. Mirror image on the right side as well. I suspect this is chronic pressure friction and moisture from incontinence. This makes it difficult to get this area to completely close. We have been using silver alginate 12/29 both her legs remain closed and she has bilateral juxta lite stockings. The real problem here is her left buttock she has a small wound in the mid part of a extremely irritated dermatitis. It almost looks bruised in spots. There is superficial loss of epithelium. The actual wound that we have been dealing with does not look so bad. I have been assuming this is some combination of friction pressure wetness from constant urinary incontinence. If there is a dermatologic issue outside of that here I am not seeing it 09/03/2021; patient with a small area on the left buttock. The real problem here is a marked surrounding erythematous dermatitis with loss of surface epithelium. It looks as though she is beginning to develop  satellite lesions this may be mostly fungal 1/26; the area on her left buttock is closed however she still has very irritated macerated skin in this  area. Some of this appears fungal. I treated this with ketoconazole last time and I think it looks somewhat better although there is still some area present that still looks like a fungal dermatitis. Everything on the right buttock looks a lot better. READMISSION 12/28/2021: The patient returns today with a reopening of a wound on her left buttock. She says it has been present for about 6 months; I am not sure how this is possible since she was seen for similar in January and subsequently discharged from the clinic. Nonetheless, she has some purplish discoloration of her buttock with an open area and some scarring around the perimeter of a shallow stage II ulcer on her buttock. According to the previous clinic notes, there was some question as to whether or not this was due to fungal breakdown of her skin. She currently resides at [facility], in the assisted living section. She says that she cannot see the site and is unable to care for it herself. 01/11/2022: The wound is a little bit smaller today but has a very friable surface. It is an odd wound. On further inspection, it is almost as if there are dilated vessels and the underlying skin as I can compress the surrounding tissue, it dents in, and then fills back up, as if with fluid or blood. She did have an episode during the week when she had a lot of blood come from the wound, but is not actively bleeding today. 01/25/2022: No significant change to the wound. She continues to have purple discoloration of her buttocks, but it does still blanch. 02/08/2022: The wound was measured at slightly larger today, but to my inspection it appears about the same. It is most unusual and certainly not consistent with a typical pressure ulcer. I think it is potentially related to what ever is causing the purplish discoloration of her buttocks. Remains friable and bleeds easily with minimal manipulation. 02/22/2022: No real difference today. The base is clean without  any accumulation of slough or hypertrophic granulation tissue. I still think this may be a dermatological issue such as a venous lake or some variant of telangiectasia. 03/15/2022: The wound is unchanged. It bled fairly profusely when her dressing was removed. She has not yet received an appointment with dermatology. 04/09/2022: The orifice of the wound is smaller but the exposed tissue is still quite friable. She has an appointment with dermatology on August 22. 05/04/2022: She had a biopsy done by dermatology on August 22. We do not have the results. The patient reports she was told it was "staph" and prescribed clindamycin. She took her last capsule yesterday. T oday, the wound is basically unchanged. The surface is a little bit more flush with the surrounding tissue but it remains quite friable. There is some slough accumulation on the surface. 06/01/2022: The opening in the wound has contracted considerably. The tissues are still extremely friable and bleed with minimal manipulation. 07/02/2022: No real change to the wound, but the open area is less friable. 07/30/2022: The open portion of the wound is quite a bit smaller and the tissue is less friable. The patient reports that she has not been experiencing much bleeding at all at home. 09/10/2022: On intake, the patient's wound began bleeding profusely. The entirety of the visit involved obtaining hemostasis. This was ultimately achieved with a combination  of Surgifoam, silver nitrate chemical cauterization, and Quick Clot. Electronic Signature(s) Signed: 09/10/2022 2:34:31 PM By: Fredirick Maudlin MD FACS Entered By: Fredirick Maudlin on 09/10/2022 14:34:31 Katrina [name], Katrina [name] ([id number]) [id number] Page 3 of 10 -------------------------------------------------------------------------------- Chemical Cauterization Details Patient Name: Date of Service: Katrina [name], Katrina [name] 09/10/2022 1:15 PM Medical Record Number:  [id number] Patient Account Number: [id number] Date of Birth/Sex: Treating RN: Jan 31, 1932 (89 y.o. America Brown Primary Care Provider: [name] Other Clinician: Referring Provider: Treating Provider/Extender: Kathlyn Sacramento in Treatment: 36 Procedure Performed for: Wound #14 Left Gluteus Performed By: Physician Fredirick Maudlin, MD Post Procedure Diagnosis Same as Pre-procedure Notes Silver Nitrate and Quick Clot used. Electronic Signature(s) Signed: 09/10/2022 2:27:15 PM By: Fredirick Maudlin MD FACS Signed: 09/10/2022 4:15:50 PM By: Dellie Catholic RN Entered By: Dellie Catholic on 09/10/2022 14:16:05 -------------------------------------------------------------------------------- Physical Exam Details Patient Name: Date of Service: Katrina Amel [name]. 09/10/2022 1:15 PM Medical Record Number: [id number] Patient Account Number: [id number] Date of Birth/Sex: Treating RN: 08-22-1932 (89 y.o. F) Primary Care Provider: [name] Other Clinician: Referring Provider: Treating Provider/Extender: Kathlyn Sacramento in Treatment: 36 Constitutional Hypertensive, asymptomatic. . . . no acute distress. Respiratory Normal work of breathing on room air. Notes 09/10/2022: Once hemostasis was achieved, there has really been no change to the wound. Electronic Signature(s) Signed: 09/10/2022 2:36:24 PM By: Fredirick Maudlin MD FACS Entered By: Fredirick Maudlin on 09/10/2022 14:36:24 -------------------------------------------------------------------------------- Physician Orders Details Patient Name: Date of Service: Katrina Amel [name]. 09/10/2022 1:15 PM Medical Record Number: [id number] Patient Account Number: [id number] Date of Birth/Sex: Treating RN: 10-27-1931 (89 y.o. America Brown Primary Care Provider: [name] Other Clinician: Referring Provider: Treating Provider/Extender: Kathlyn Sacramento in  Treatment: 64 Verbal / Phone Orders: No Diagnosis Coding Jaquith, Malachy Chamber ([id number]) [id number] Page 4 of 10 Follow-up Appointments Return appointment in 1 month. - Dr. Celine Ahr RM 1 Anesthetic Wound #14 Left Gluteus (In clinic) Topical Lidocaine 4% applied to wound bed Bathing/ Shower/ Hygiene May shower and wash wound with soap and water. Off-Loading Turn and reposition every 2 hours - stand for a few minutes at least every hour during the day Wound Treatment Wound #14 - Gluteus Wound Laterality: Left Cleanser: Soap and Water 1 x Per Day/30 Days Discharge Instructions: May shower and wash wound with dial antibacterial soap and water prior to dressing change. Prim Dressing: KerraCel Ag Gelling Fiber Dressing, 2x2 in (silver alginate) 1 x Per Day/30 Days ary Discharge Instructions: Apply silver alginate to wound bed as instructed Secondary Dressing: ALLEVYN Gentle Border, 4x4 (in/in) 1 x Per Day/30 Days Discharge Instructions: Apply over primary dressing as directed. Electronic Signature(s) Signed: 09/10/2022 2:42:53 PM By: Fredirick Maudlin MD FACS Previous Signature: 09/10/2022 2:27:15 PM Version By: Fredirick Maudlin MD FACS Entered By: Fredirick Maudlin on 09/10/2022 14:36:37 -------------------------------------------------------------------------------- Problem List Details Patient Name: Date of Service: Katrina Amel [name]. 09/10/2022 1:15 PM Medical Record Number: [id number] Patient Account Number: [id number] Date of Birth/Sex: Treating RN: Oct 26, 1931 (89 y.o. F) Primary Care Provider: [name] Other Clinician: Referring Provider: Treating Provider/Extender: Kathlyn Sacramento in Treatment: 36 Active Problems ICD-10 Encounter Code Description Active Date MDM Diagnosis [phone] Pressure ulcer of left buttock, stage 2 12/28/2021 No Yes I87.2 Venous insufficiency (chronic) (peripheral) 12/28/2021 No Yes I10 Essential (primary)  hypertension 12/28/2021 No Yes Inactive Problems Resolved Problems Electronic Signature(s) Signed: 09/10/2022 2:27:57 PM By: Fredirick Maudlin MD FACS Entered By: Fredirick Maudlin on 09/10/2022 14:27:56 Lastra, Katrina [name] ([id number])  [id number] Page 5 of 10 -------------------------------------------------------------------------------- Progress Note Details Patient Name: Date of Service: TESSIA, KASSIN 09/10/2022 1:15 PM Medical Record Number: [id number] Patient Account Number: [id number] Date of Birth/Sex: Treating RN: February 26, 1932 (89 y.o. F) Primary Care Provider: [name] Other Clinician: Referring Provider: Treating Provider/Extender: Kathlyn Sacramento in Treatment: 36 Subjective Chief Complaint Information obtained from Patient Patient returns to the wound care center for reopened ulcer to: Left buttock History of Present Illness (HPI) The following HPI elements were documented for the patient's wound: Location: right lower extremity Quality: Patient reports experiencing a dull pain to affected area(s). Severity: Patient states wound(s) are getting better. Duration: Patient has had the wound for 3 months prior to presenting for treatment Context: The wound would happen gradually Modifying Factors: Patient wound(s)/ulcer(s) are worsening due to :standing for a long while. Past medical history of pulmonary emboli, hypothyroidism, hyperlipidemia, hypertension, sleep apnea, gallstones, ulcerated lower extremities, atrial fibrillation, status post total knee replacement, cholecystectomy, cystoscopy and stent placement for kidney stones. She has never been a smoker. About a year ago she was seen by [name] with an ulcer of her right lower extremity which she's had for about over 3 months. On 10/23/14 -- Lower extremity venous duplex evaluation. She had no evidence of deep venous thromboses involving both lower extremities. There was evidence  of deep vein reflux in the right and left common femoral veins. She also had evidence of great saphenous vein reflux noted only at the level of the saphenofemoral junction. No evidence of greater or small saphenous vein reflux. She will need a consult to the vascular surgeon. Seen by Dr. Mathis Fare office note dated 11/13/2014. He noted that she had good arterial blood flow with palpable DP pulses bilaterally. Based on the patient's history Dr. Scot Dock recommended elevation when at rest of both feet and knees and ankles above heart level in the supine position. Activity such as walking and pool exercises were recommended. She will also have 15-20 mm thigh-high compression stockings daily once the ulcers have completely healed. A prescription was given to her. 11/04/2015 -- the wound on her right lower extremity has completely healed and her edema has gone down significantly. However in her popliteal fossa where she's got a lot of fungal infection there is an area which is now an open wound and this will need attention. 12/02/2015 -- the right lower extremity looks much better than the edema has gone down significantly however she continues to have significant edema on the left side today. 12/09/2015 -- the patient has been doing her dressings as advised and says that overall she feels much better. She has received her juxta light for the left lower extremity READMISSION 06/25/2021 This is a patient who is actually had several previous visits to our clinic in 2014, 2016 and more recently 2017. This was largely because of wounds on her lower legs in the setting of chronic venous insufficiency and secondary lymphedema. Looking at my records she was apparently prescribed thigh-high compression stockings probably by vein and vascular although she comes back in not having worn any stockings in quite a period of time. She is now in the assisted living part of [facility]. She  thinks her wounds have been there since April. This includes the left lateral, right anterior and right posterior lateral parts of both lower legs. She also has a stage II pressure ulcer on her left glued I think which is happened because of sleeping in a  lift chair. She had ABDs and kerlix on her bilateral lower legs. Past medical history reviewed she has hypothyroidism, left buttock pressure ulcer dating back to March of this year, iron deficiency, falls, paroxysmal atrial fibrillation anticoagulated and chronic venous insufficiency ABIs in our clinic were 0.86 on the right and 0.96 on the left 11/10; patient comes into clinic today after being admitted last week with uncontrolled lymphedema, severe venous insufficiency and some superficial wounds on her bilateral lower legs. We put her in 3 layer compression and she comes in today with all the wounds closed and considerable improvement in the condition of her lower legs. The patient is going to need lifelong compression stockings probably juxta lites and we are going to go ahead and order those for her today. Unfortunately the POA here is a niece in [place] were probably going to have to talk with her to arrange proper payment for the juxta lite stockings. She is in the assisted living level of [facility]. She does not feel she can get the stockings on independently I am hopeful that the nurse and aids at the assisted living can help her with this otherwise this may be a problem. She also has an area on her left buttock which is a stage II wound. A lot of unhealthy irritated skin around this I think from moisture prolonged sittingo Incontinence 11/17; the patient's area on the left buttock is just about closed although there is still a lot of unhealthy looking discolored skin around a large area here. I suspect this is chronic moisture, pressure, perhaps incontinence. she has minuscule open areas on her bilateral lower legs.  Apparently our intermediary did not get the order for juxta lite stockings therefore she does not have any. 12/1; both leg wounds are healed and she has been wearing bilateral juxta lite stockings for 2 days. [facility] are assisting her with this. The left buttock wound unfortunately has deteriorated 12/15; 2-week follow-up. Her legs remain healed she has the other area which is on her left buttock. This is surrounded circumferentially by chronically irritated skin. Mirror image on the right side as well. I suspect this is chronic pressure friction and moisture from incontinence. This makes it difficult to get this area to completely close. We have been using silver alginate 12/29 both her legs remain closed and she has bilateral juxta lite stockings. Katrina [name], Katrina [name] ([id number]) [id number] Page 6 of 10 The real problem here is her left buttock she has a small wound in the mid part of a extremely irritated dermatitis. It almost looks bruised in spots. There is superficial loss of epithelium. The actual wound that we have been dealing with does not look so bad. I have been assuming this is some combination of friction pressure wetness from constant urinary incontinence. If there is a dermatologic issue outside of that here I am not seeing it 09/03/2021; patient with a small area on the left buttock. The real problem here is a marked surrounding erythematous dermatitis with loss of surface epithelium. It looks as though she is beginning to develop satellite lesions this may be mostly fungal 1/26; the area on her left buttock is closed however she still has very irritated macerated skin in this area. Some of this appears fungal. I treated this with ketoconazole last time and I think it looks somewhat better although there is still some area present that still looks like a fungal dermatitis. Everything on the right buttock looks a  lot better. READMISSION 12/28/2021:  The patient returns today with a reopening of a wound on her left buttock. She says it has been present for about 6 months; I am not sure how this is possible since she was seen for similar in January and subsequently discharged from the clinic. Nonetheless, she has some purplish discoloration of her buttock with an open area and some scarring around the perimeter of a shallow stage II ulcer on her buttock. According to the previous clinic notes, there was some question as to whether or not this was due to fungal breakdown of her skin. She currently resides at [facility], in the assisted living section. She says that she cannot see the site and is unable to care for it herself. 01/11/2022: The wound is a little bit smaller today but has a very friable surface. It is an odd wound. On further inspection, it is almost as if there are dilated vessels and the underlying skin as I can compress the surrounding tissue, it dents in, and then fills back up, as if with fluid or blood. She did have an episode during the week when she had a lot of blood come from the wound, but is not actively bleeding today. 01/25/2022: No significant change to the wound. She continues to have purple discoloration of her buttocks, but it does still blanch. 02/08/2022: The wound was measured at slightly larger today, but to my inspection it appears about the same. It is most unusual and certainly not consistent with a typical pressure ulcer. I think it is potentially related to what ever is causing the purplish discoloration of her buttocks. Remains friable and bleeds easily with minimal manipulation. 02/22/2022: No real difference today. The base is clean without any accumulation of slough or hypertrophic granulation tissue. I still think this may be a dermatological issue such as a venous lake or some variant of telangiectasia. 03/15/2022: The wound is unchanged. It bled fairly profusely when her dressing was removed. She has not yet  received an appointment with dermatology. 04/09/2022: The orifice of the wound is smaller but the exposed tissue is still quite friable. She has an appointment with dermatology on August 22. 05/04/2022: She had a biopsy done by dermatology on August 22. We do not have the results. The patient reports she was told it was "staph" and prescribed clindamycin. She took her last capsule yesterday. T oday, the wound is basically unchanged. The surface is a little bit more flush with the surrounding tissue but it remains quite friable. There is some slough accumulation on the surface. 06/01/2022: The opening in the wound has contracted considerably. The tissues are still extremely friable and bleed with minimal manipulation. 07/02/2022: No real change to the wound, but the open area is less friable. 07/30/2022: The open portion of the wound is quite a bit smaller and the tissue is less friable. The patient reports that she has not been experiencing much bleeding at all at home. 09/10/2022: On intake, the patient's wound began bleeding profusely. The entirety of the visit involved obtaining hemostasis. This was ultimately achieved with a combination of Surgifoam, silver nitrate chemical cauterization, and Quick Clot. Patient History Information obtained from Patient. Family History Cancer - Siblings, Heart Disease - Mother,Father, Hypertension - Father, No family history of Hereditary Spherocytosis, Kidney Disease, Lung Disease, Seizures, Stroke, Thyroid Problems, Tuberculosis. Social History Never smoker, Marital Status - Single, Alcohol Use - Never, Drug Use - No History, Caffeine Use - Rarely. Medical History Eyes Denies  history of Cataracts, Glaucoma, Optic Neuritis Ear/Nose/Mouth/Throat Denies history of Chronic sinus problems/congestion, Middle ear problems Hematologic/Lymphatic Denies history of Anemia, Hemophilia, Human Immunodeficiency Virus, Lymphedema, Sickle Cell Disease Respiratory Patient  has history of Sleep Apnea - does not use Cpap or 02 as ordered Denies history of Aspiration, Asthma, Chronic Obstructive Pulmonary Disease (COPD), Pneumothorax, Tuberculosis Cardiovascular Patient has history of Arrhythmia - A-Fib, Hypertension, Phlebitis Denies history of Angina, Congestive Heart Failure, Coronary Artery Disease, Deep Vein Thrombosis, Hypotension, Myocardial Infarction, Peripheral Arterial Disease, Peripheral Venous Disease, Vasculitis Gastrointestinal Denies history of Cirrhosis , Colitis, Crohnoos, Hepatitis A, Hepatitis B, Hepatitis C Endocrine Denies history of Type I Diabetes, Type II Diabetes Genitourinary Denies history of End Stage Renal Disease Immunological Denies history of Lupus Erythematosus, Raynaudoos, Scleroderma Integumentary (Skin) Denies history of History of Burn Musculoskeletal Denies history of Gout, Rheumatoid Arthritis, Osteoarthritis, Osteomyelitis Neurologic Denies history of Dementia, Neuropathy, Quadriplegia, Paraplegia, Seizure Disorder Oncologic Denies history of Received Chemotherapy, Received Radiation Psychiatric Denies history of Anorexia/bulimia, Confinement Anxiety [place], Elverda [name] ([id number]) [id number] Page 7 of 10 Medical A Surgical History Notes nd Cardiovascular Hyperlipidemia Endocrine Hypothyroidism Objective Constitutional Hypertensive, asymptomatic. no acute distress. Vitals Time Taken: 1:34 PM, Height: 61 in, Weight: 195 lbs, BMI: 36.8, Temperature: 97.8 F, Pulse: 70 bpm, Respiratory Rate: 18 breaths/min, Blood Pressure: 157/84 mmHg. Respiratory Normal work of breathing on room air. General Notes: 09/10/2022: Once hemostasis was achieved, there has really been no change to the wound. Integumentary (Hair, Skin) Wound #14 status is Open. Original cause of wound was Pressure Injury. The date acquired was: 08/23/2021. The wound has been in treatment 36 weeks. The wound is located on the  Left Gluteus. The wound measures 0.4cm length x 0.5cm width x 0.2cm depth; 0.157cm^2 area and 0.031cm^3 volume. There is Fat Layer (Subcutaneous Tissue) exposed. There is no tunneling or undermining noted. There is a medium amount of sanguinous drainage noted. The wound margin is epibole. There is large (67-100%) red, friable granulation within the wound bed. There is a small (1-33%) amount of necrotic tissue within the wound bed including Adherent Slough. The periwound skin appearance had no abnormalities noted for moisture. The periwound skin appearance exhibited: Scarring. The periwound skin appearance did not exhibit: Callus, Crepitus, Excoriation, Induration, Rash, Atrophie Blanche, Cyanosis, Ecchymosis, Hemosiderin Staining, Mottled, Pallor, Rubor, Erythema. Periwound temperature was noted as No Abnormality. Assessment Active Problems ICD-10 Pressure ulcer of left buttock, stage 2 Venous insufficiency (chronic) (peripheral) Essential (primary) hypertension Procedures Wound #14 Pre-procedure diagnosis of Wound #14 is a Pressure Ulcer located on the Left Gluteus . An Chemical Cauterization procedure was performed by Fredirick Maudlin, MD. Post procedure Diagnosis Wound #14: Same as Pre-Procedure Notes: Silver Nitrate and Quick Clot used. Plan Follow-up Appointments: Return appointment in 1 month. - Dr. Celine Ahr RM 1 Anesthetic: Wound #14 Left Gluteus: (In clinic) Topical Lidocaine 4% applied to wound bed Bathing/ Shower/ Hygiene: May shower and wash wound with soap and water. Off-Loading: Turn and reposition every 2 hours - stand for a few minutes at least every hour during the day WOUND #14: - Gluteus Wound Laterality: Left Cleanser: Soap and Water 1 x Per Day/30 Days Discharge Instructions: May shower and wash wound with dial antibacterial soap and water prior to dressing change. Prim Dressing: KerraCel Ag Gelling Fiber Dressing, 2x2 in (silver alginate) 1 x Per Day/30  Days ary Discharge Instructions: Apply silver alginate to wound bed as instructed Secondary Dressing: ALLEVYN Gentle Border, 4x4 (in/in) 1 x Per Day/30 Days Discharge Instructions: Apply over  primary dressing as directed. Katrina [name], Katrina [name] ([id number]) [id number] Page 8 of 10 09/10/2022: On intake, the patient's wound was bleeding profusely. I used silver nitrate to chemically cauterize the friable bleeding tissue. Additional hemostatic agents were used to obtain complete cessation of blood flow. These included Surgifoam, quick clot, and direct pressure. I do not believe this wound actually represents a pressure ulcer as was initially billed. The visual appearance of the site is that of a dilated cutaneous vein that periodically erodes and bleeds. I am not sure how much utility is being gained by continued management in the wound care center and the patient agrees. She will follow-up as needed. Electronic Signature(s) Signed: 09/10/2022 2:41:53 PM By: Fredirick Maudlin MD FACS Entered By: Fredirick Maudlin on 09/10/2022 14:41:52 -------------------------------------------------------------------------------- HxROS Details Patient Name: Date of Service: Katrina Amel [name]. 09/10/2022 1:15 PM Medical Record Number: [id number] Patient Account Number: [id number] Date of Birth/Sex: Treating RN: 02-24-1932 (89 y.o. F) Primary Care Provider: [name] Other Clinician: Referring Provider: Treating Provider/Extender: Kathlyn Sacramento in Treatment: 36 Information Obtained From Patient Eyes Medical History: Negative for: Cataracts; Glaucoma; Optic Neuritis Ear/Nose/Mouth/Throat Medical History: Negative for: Chronic sinus problems/congestion; Middle ear problems Hematologic/Lymphatic Medical History: Negative for: Anemia; Hemophilia; Human Immunodeficiency Virus; Lymphedema; Sickle Cell Disease Respiratory Medical History: Positive for: Sleep Apnea  - does not use Cpap or 02 as ordered Negative for: Aspiration; Asthma; Chronic Obstructive Pulmonary Disease (COPD); Pneumothorax; Tuberculosis Cardiovascular Medical History: Positive for: Arrhythmia - A-Fib; Hypertension; Phlebitis Negative for: Angina; Congestive Heart Failure; Coronary Artery Disease; Deep Vein Thrombosis; Hypotension; Myocardial Infarction; Peripheral Arterial Disease; Peripheral Venous Disease; Vasculitis Past Medical History Notes: Hyperlipidemia Gastrointestinal Medical History: Negative for: Cirrhosis ; Colitis; Crohns; Hepatitis A; Hepatitis B; Hepatitis C Endocrine Medical History: Negative for: Type I Diabetes; Type II Diabetes Past Medical History Notes: Hypothyroidism Genitourinary Medical History: Negative for: End Stage Renal Disease Katrina [name], Katrina [name] ([id number]) [id number] Page 9 of 10 Immunological Medical History: Negative for: Lupus Erythematosus; Raynauds; Scleroderma Integumentary (Skin) Medical History: Negative for: History of Burn Musculoskeletal Medical History: Negative for: Gout; Rheumatoid Arthritis; Osteoarthritis; Osteomyelitis Neurologic Medical History: Negative for: Dementia; Neuropathy; Quadriplegia; Paraplegia; Seizure Disorder Oncologic Medical History: Negative for: Received Chemotherapy; Received Radiation Psychiatric Medical History: Negative for: Anorexia/bulimia; Confinement Anxiety Immunizations Pneumococcal Vaccine: Received Pneumococcal Vaccination: Yes Received Pneumococcal Vaccination On or After 60th Birthday: No Implantable Devices None Family and Social History Cancer: Yes - Siblings; Heart Disease: Yes - Mother,Father; Hereditary Spherocytosis: No; Hypertension: Yes - Father; Kidney Disease: No; Lung Disease: No; Seizures: No; Stroke: No; Thyroid Problems: No; Tuberculosis: No; Never smoker; Marital Status - Single; Alcohol Use: Never; Drug Use: No History; Caffeine Use: Rarely;  Financial Concerns: No; Food, Clothing or Shelter Needs: No; Support System Lacking: No; Transportation Concerns: No Electronic Signature(s) Signed: 09/10/2022 2:42:53 PM By: Fredirick Maudlin MD FACS Entered By: Fredirick Maudlin on 09/10/2022 14:35:39 -------------------------------------------------------------------------------- SuperBill Details Patient Name: Date of Service: Katrina Amel [name]. 09/10/2022 Medical Record Number: [id number] Patient Account Number: [id number] Date of Birth/Sex: Treating RN: Feb 13, 1932 (89 y.o. [place] Brown Primary Care Provider: [name] Other Clinician: Referring Provider: Treating Provider/Extender: Kathlyn Sacramento in Treatment: 36 Diagnosis Coding ICD-10 Codes Code Description [phone] Pressure ulcer of left buttock, stage 2 I87.2 Venous insufficiency (chronic) (peripheral) I10 Essential (primary) hypertension Zarazua, Serayah [name] ([id number]) [id number] Page 10 of 10 Facility Procedures : CPT4 Code: [id number] Description: 41962 - CHEM CAUT GRANULATION TISS ICD-10 Diagnosis Description L89.322 Pressure ulcer of left buttock,  stage 2 Modifier: Quantity: 1 Physician Procedures : CPT4 Code Description Modifier [id number] - WC PHYS LEVEL 3 - EST PT ICD-10 Diagnosis Description L89.322 Pressure ulcer of left buttock, stage 2 I87.2 Venous insufficiency (chronic) (peripheral) I10 Essential (primary) hypertension Quantity: 1 : [id number] - WC PHYS CHEM CAUT GRAN TISSUE ICD-10 Diagnosis Description L89.322 Pressure ulcer of left buttock, stage 2 Quantity: 1 Electronic Signature(s) Signed: 09/10/2022 2:42:16 PM By: Fredirick Maudlin MD FACS Entered By: Fredirick Maudlin on 09/10/2022 14:42:15

## 2022-09-27 IMAGING — CT CT ABD-PELV W/O CM
2 of 4 series · 16 of 46 positions shown, 18 images · non-contrast
Comparison: CT 04/01/2011

CLINICAL DATA: Abdominal pain, acute, nonlocalized. Vomiting over
the last 2 days.



[Series 2: axial st · axial · 0.88mm/px · z∈[-476,-76]mm · 13 of 92 slices shown, 15 images]
[im 6/92  soft-tissue]
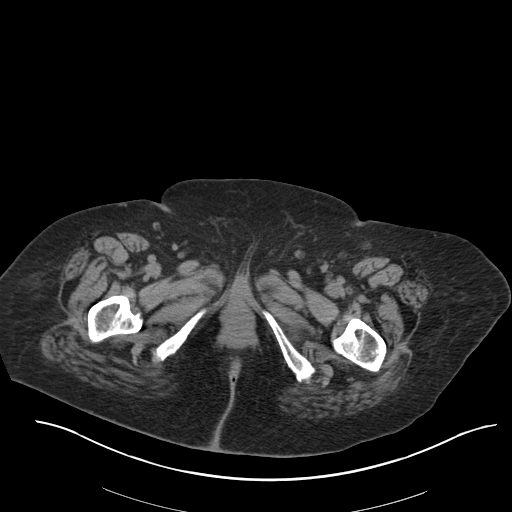
[im 6/92  bone]
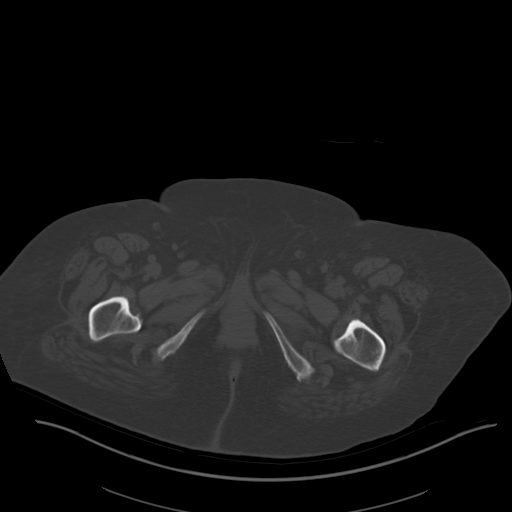
[im 11/92  soft-tissue]
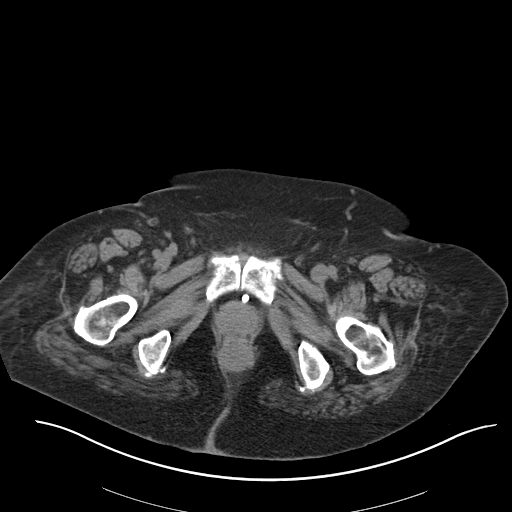
[im 22/92  soft-tissue]
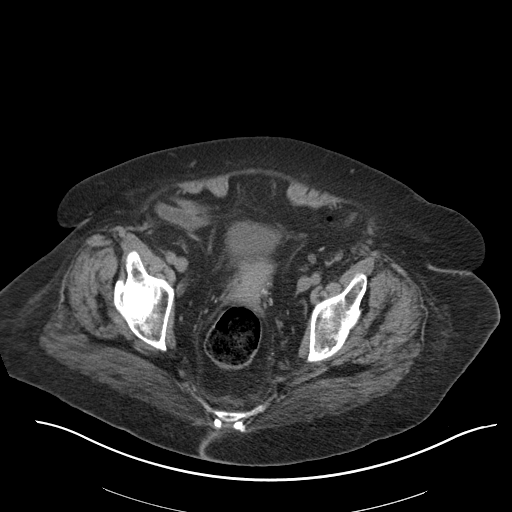
[im 27/92  soft-tissue]
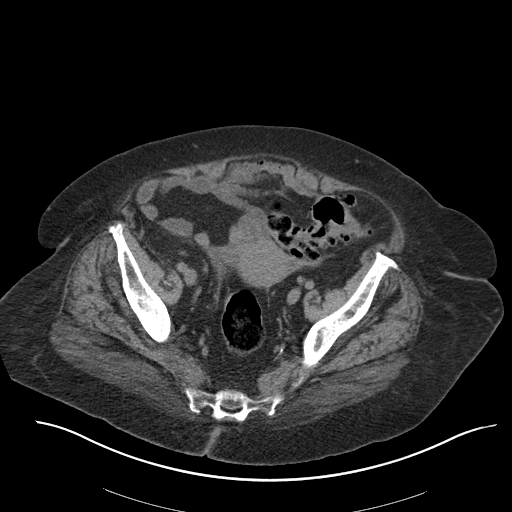
[im 33/92  soft-tissue]
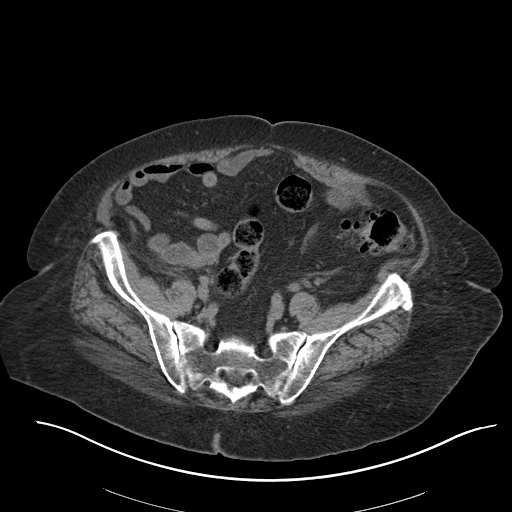
[im 38/92  soft-tissue]
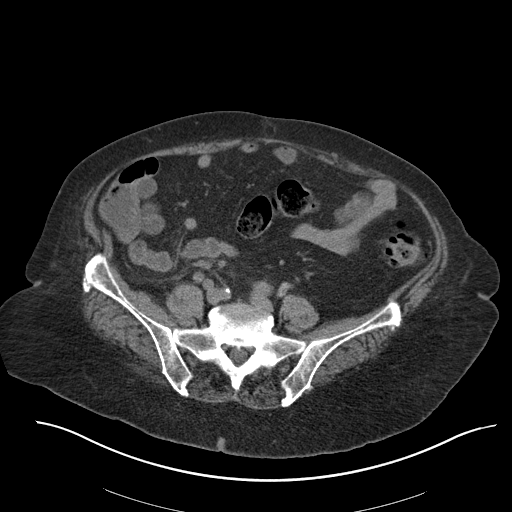
[im 49/92  soft-tissue]
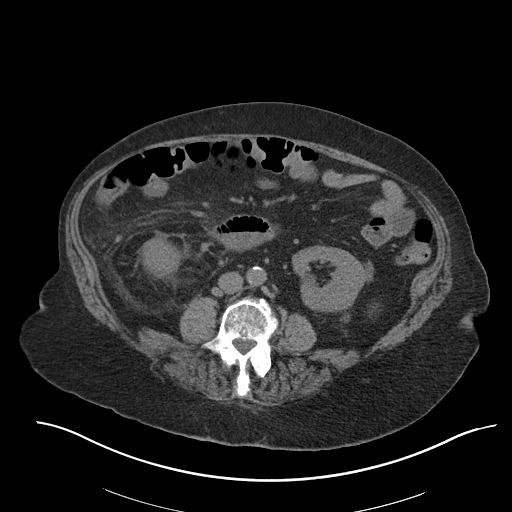
[im 54/92  soft-tissue]
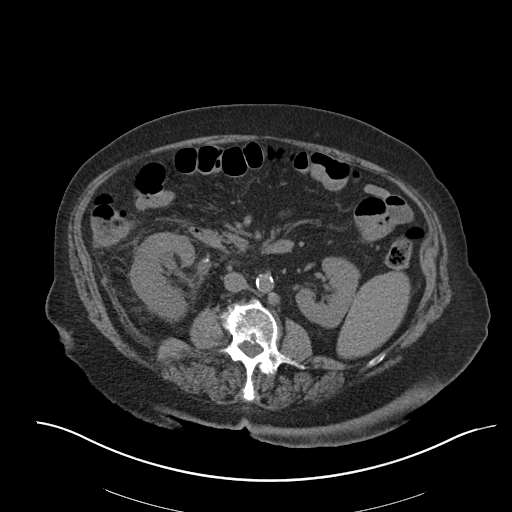
[im 59/92  soft-tissue]
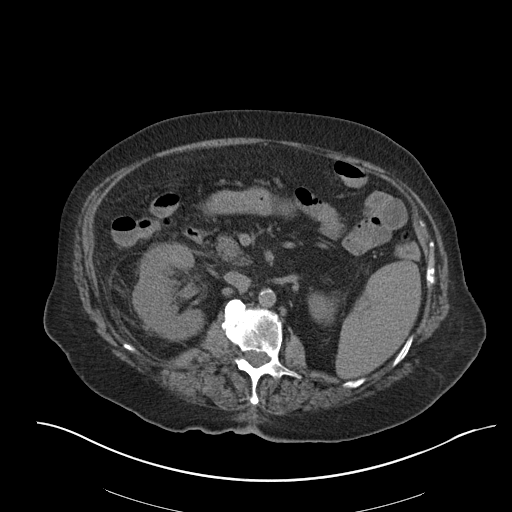
[im 59/92  bone]
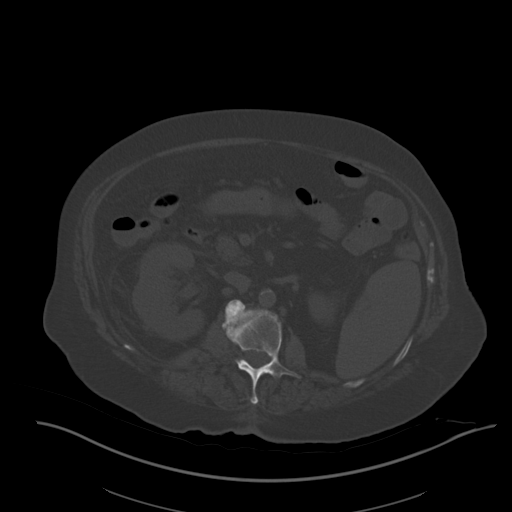
[im 65/92  soft-tissue]
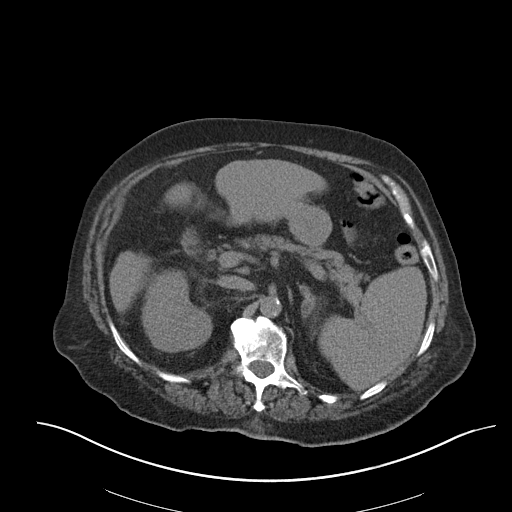
[im 70/92  soft-tissue]
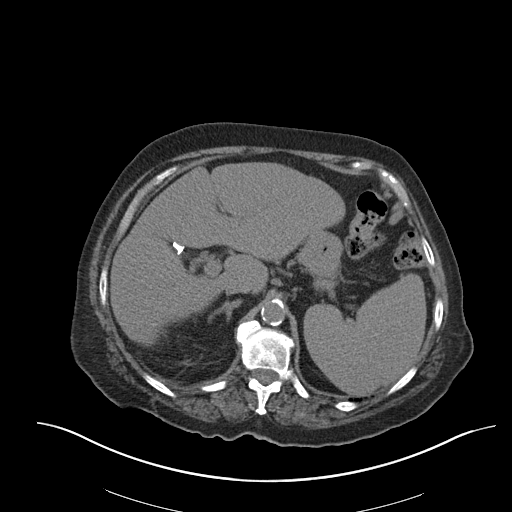
[im 81/92  soft-tissue]
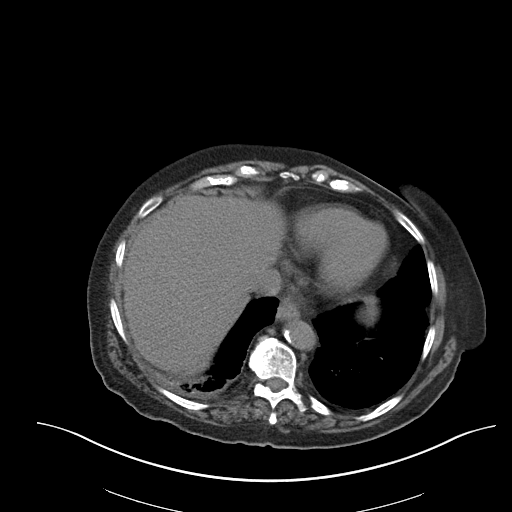
[im 86/92  soft-tissue]
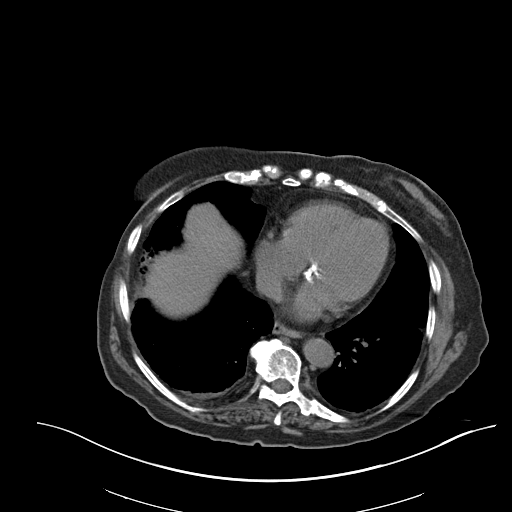

[Series 5: coronal st · coronal · 0.89mm/px · 3 of 151 slices shown]
[im 51/151  soft-tissue]
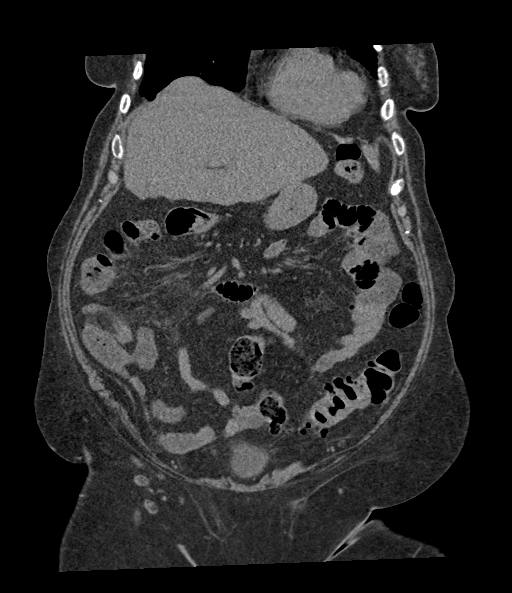
[im 67/151  soft-tissue]
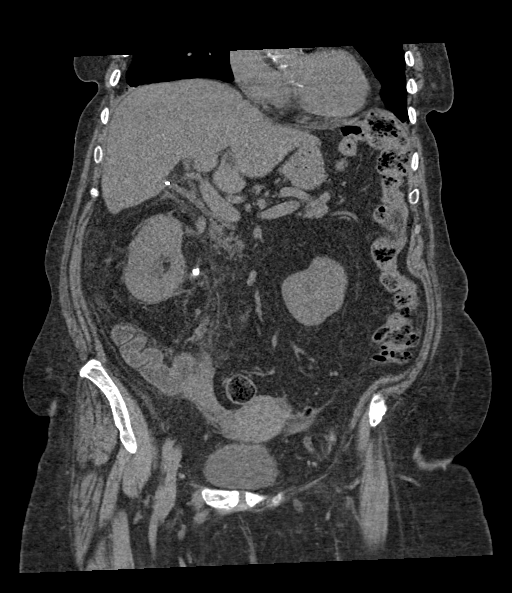
[im 84/151  soft-tissue]
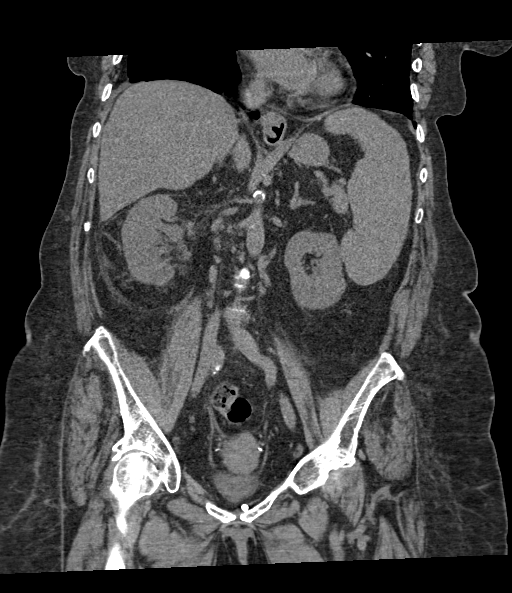

[16 of 46 positions shown; findings below may reference images not displayed]

FINDINGS: Lower chest: Scarring and bronchiectasis in both lower lobes. There
is some dependent atelectasis, right more than left. Small hiatal
hernia is noted.

Hepatobiliary: Cirrhosis of the liver with relative enlargement of
the left lobe and slight lobular surface. Previous cholecystectomy.
Focal calcification in the left lobe slightly more prominent than
was seen in 0210. Liver cysts in the right and left lobe are smaller
than were seen in 0210.

Pancreas: Negative

Spleen: Mild splenomegaly.  No focal lesion.

Adrenals/Urinary Tract: Adrenal glands are normal. The left kidney
is normal. There is hydroureteronephrosis on the right due 2 an 8 mm
stone at the UPJ. Retroperitoneal edema consistent with pyelo sinus
extravasation. No stone seen distal to that. No stone in the
bladder.

Stomach/Bowel: Small hiatal hernia as noted above. Small bowel is
normal. There is diverticulosis of the left colon but no evidence of
diverticulitis.

Vascular/Lymphatic: Aortic atherosclerosis. No aneurysm. IVC is
normal. No adenopathy.

Reproductive: No pelvic mass.

Other: No free fluid or air.

Musculoskeletal: Chronic spinal degenerative changes.
IMPRESSION: Hydroureteronephrosis on the right because of an 8 mm stone at the
right UPJ. Retroperitoneal edema consistent with pyelo sinus
extravasation.

Cirrhosis of the liver, newly demonstrated since the prior study of
0210. Splenomegaly consistent with portal venous hypertension.

Scarring and bronchiectasis in the lower lungs with dependent
atelectasis right more than left.

## 2023-02-03 ENCOUNTER — Encounter: Payer: Self-pay | Admitting: Nurse Practitioner

## 2023-02-04 NOTE — Progress Notes (Signed)
This encounter was created in error - please disregard.

## 2023-09-15 ENCOUNTER — Emergency Department (HOSPITAL_COMMUNITY): Payer: Medicare Other

## 2023-09-15 ENCOUNTER — Inpatient Hospital Stay (HOSPITAL_COMMUNITY)
Admission: EM | Admit: 2023-09-15 | Discharge: 2023-09-20 | DRG: 242 | Disposition: A | Discharge status: Home / Self Care | Payer: Medicare Other | Source: Skilled Nursing Facility | Attending: Family Medicine | Admitting: Family Medicine

## 2023-09-15 ENCOUNTER — Other Ambulatory Visit: Payer: Self-pay

## 2023-09-15 DIAGNOSIS — I471 Supraventricular tachycardia, unspecified: Secondary | ICD-10-CM | POA: Diagnosis present

## 2023-09-15 DIAGNOSIS — R296 Repeated falls: Principal | ICD-10-CM

## 2023-09-15 DIAGNOSIS — Z960 Presence of urogenital implants: Secondary | ICD-10-CM | POA: Diagnosis present

## 2023-09-15 DIAGNOSIS — I129 Hypertensive chronic kidney disease with stage 1 through stage 4 chronic kidney disease, or unspecified chronic kidney disease: Secondary | ICD-10-CM | POA: Diagnosis present

## 2023-09-15 DIAGNOSIS — Z79890 Hormone replacement therapy (postmenopausal): Secondary | ICD-10-CM

## 2023-09-15 DIAGNOSIS — L89316 Pressure-induced deep tissue damage of right buttock: Secondary | ICD-10-CM | POA: Diagnosis present

## 2023-09-15 DIAGNOSIS — Z9049 Acquired absence of other specified parts of digestive tract: Secondary | ICD-10-CM

## 2023-09-15 DIAGNOSIS — Z1152 Encounter for screening for COVID-19: Secondary | ICD-10-CM

## 2023-09-15 DIAGNOSIS — Z881 Allergy status to other antibiotic agents status: Secondary | ICD-10-CM | POA: Diagnosis not present

## 2023-09-15 DIAGNOSIS — N1831 Chronic kidney disease, stage 3a: Secondary | ICD-10-CM | POA: Diagnosis present

## 2023-09-15 DIAGNOSIS — Z86718 Personal history of other venous thrombosis and embolism: Secondary | ICD-10-CM

## 2023-09-15 DIAGNOSIS — I442 Atrioventricular block, complete: Principal | ICD-10-CM | POA: Diagnosis present

## 2023-09-15 DIAGNOSIS — Z7901 Long term (current) use of anticoagulants: Secondary | ICD-10-CM | POA: Diagnosis not present

## 2023-09-15 DIAGNOSIS — R001 Bradycardia, unspecified: Secondary | ICD-10-CM | POA: Diagnosis not present

## 2023-09-15 DIAGNOSIS — Z880 Allergy status to penicillin: Secondary | ICD-10-CM | POA: Diagnosis not present

## 2023-09-15 DIAGNOSIS — E039 Hypothyroidism, unspecified: Secondary | ICD-10-CM | POA: Diagnosis present

## 2023-09-15 DIAGNOSIS — G9341 Metabolic encephalopathy: Secondary | ICD-10-CM | POA: Diagnosis present

## 2023-09-15 DIAGNOSIS — Z885 Allergy status to narcotic agent status: Secondary | ICD-10-CM | POA: Diagnosis not present

## 2023-09-15 DIAGNOSIS — Z85828 Personal history of other malignant neoplasm of skin: Secondary | ICD-10-CM

## 2023-09-15 DIAGNOSIS — I441 Atrioventricular block, second degree: Secondary | ICD-10-CM

## 2023-09-15 DIAGNOSIS — N3281 Overactive bladder: Secondary | ICD-10-CM | POA: Diagnosis present

## 2023-09-15 DIAGNOSIS — D696 Thrombocytopenia, unspecified: Secondary | ICD-10-CM | POA: Diagnosis present

## 2023-09-15 DIAGNOSIS — G4730 Sleep apnea, unspecified: Secondary | ICD-10-CM | POA: Diagnosis present

## 2023-09-15 DIAGNOSIS — Z79899 Other long term (current) drug therapy: Secondary | ICD-10-CM | POA: Diagnosis not present

## 2023-09-15 DIAGNOSIS — K7460 Unspecified cirrhosis of liver: Secondary | ICD-10-CM | POA: Diagnosis present

## 2023-09-15 DIAGNOSIS — Z8249 Family history of ischemic heart disease and other diseases of the circulatory system: Secondary | ICD-10-CM | POA: Diagnosis not present

## 2023-09-15 DIAGNOSIS — Z96653 Presence of artificial knee joint, bilateral: Secondary | ICD-10-CM | POA: Diagnosis present

## 2023-09-15 DIAGNOSIS — Z83438 Family history of other disorder of lipoprotein metabolism and other lipidemia: Secondary | ICD-10-CM

## 2023-09-15 DIAGNOSIS — E1122 Type 2 diabetes mellitus with diabetic chronic kidney disease: Secondary | ICD-10-CM | POA: Diagnosis present

## 2023-09-15 DIAGNOSIS — I480 Paroxysmal atrial fibrillation: Secondary | ICD-10-CM | POA: Diagnosis present

## 2023-09-15 DIAGNOSIS — Z86711 Personal history of pulmonary embolism: Secondary | ICD-10-CM | POA: Diagnosis not present

## 2023-09-15 DIAGNOSIS — Z87442 Personal history of urinary calculi: Secondary | ICD-10-CM

## 2023-09-15 DIAGNOSIS — R4182 Altered mental status, unspecified: Secondary | ICD-10-CM

## 2023-09-15 LAB — CBC WITH DIFFERENTIAL/PLATELET
Abs Immature Granulocytes: 0.03 10*3/uL (ref 0.00–0.07)
Basophils Absolute: 0 10*3/uL (ref 0.0–0.1)
Basophils Relative: 0 %
Eosinophils Absolute: 0 10*3/uL (ref 0.0–0.5)
Eosinophils Relative: 0 %
HCT: 38.8 % (ref 36.0–46.0)
Hemoglobin: 12.9 g/dL (ref 12.0–15.0)
Immature Granulocytes: 0 %
Lymphocytes Relative: 11 %
Lymphs Abs: 0.9 10*3/uL (ref 0.7–4.0)
MCH: 29.6 pg (ref 26.0–34.0)
MCHC: 33.2 g/dL (ref 30.0–36.0)
MCV: 89 fL (ref 80.0–100.0)
Monocytes Absolute: 0.2 10*3/uL (ref 0.1–1.0)
Monocytes Relative: 3 %
Neutro Abs: 7.2 10*3/uL (ref 1.7–7.7)
Neutrophils Relative %: 86 %
Platelets: 185 10*3/uL (ref 150–400)
RBC: 4.36 MIL/uL (ref 3.87–5.11)
RDW: 14.3 % (ref 11.5–15.5)
WBC: 8.4 10*3/uL (ref 4.0–10.5)
nRBC: 0 % (ref 0.0–0.2)

## 2023-09-15 LAB — COMPREHENSIVE METABOLIC PANEL
ALT: 17 U/L (ref 0–44)
AST: 27 U/L (ref 15–41)
Albumin: 3.6 g/dL (ref 3.5–5.0)
Alkaline Phosphatase: 83 U/L (ref 38–126)
Anion gap: 10 (ref 5–15)
BUN: 20 mg/dL (ref 8–23)
CO2: 21 mmol/L — ABNORMAL LOW (ref 22–32)
Calcium: 9.2 mg/dL (ref 8.9–10.3)
Chloride: 106 mmol/L (ref 98–111)
Creatinine, Ser: 1.09 mg/dL — ABNORMAL HIGH (ref 0.44–1.00)
GFR, Estimated: 49 mL/min — ABNORMAL LOW (ref >60)
Glucose, Bld: 136 mg/dL — ABNORMAL HIGH (ref 70–99)
Potassium: 4.6 mmol/L (ref 3.5–5.1)
Sodium: 137 mmol/L (ref 135–145)
Total Bilirubin: 0.8 mg/dL (ref 0.0–1.2)
Total Protein: 8 g/dL (ref 6.5–8.1)

## 2023-09-15 LAB — LACTIC ACID, PLASMA: Lactic Acid, Venous: 1.4 mmol/L (ref 0.5–1.9)

## 2023-09-15 LAB — LIPASE, BLOOD: Lipase: 40 U/L (ref 11–51)

## 2023-09-15 LAB — RESP PANEL BY RT-PCR (RSV, FLU A&B, COVID)  RVPGX2
Influenza A by PCR: NEGATIVE
Influenza B by PCR: NEGATIVE
Resp Syncytial Virus by PCR: NEGATIVE
SARS Coronavirus 2 by RT PCR: NEGATIVE

## 2023-09-15 LAB — AMMONIA: Ammonia: <10 umol/L (ref 9–35)

## 2023-09-15 LAB — DIGOXIN LEVEL: Digoxin Level: 1 ng/mL (ref 0.8–2.0)

## 2023-09-15 LAB — TSH: TSH: 0.59 u[IU]/mL (ref 0.350–4.500)

## 2023-09-15 MED ORDER — PROSIGHT PO TABS
1.0000 | ORAL_TABLET | Freq: Every day | ORAL | Status: DC
  Administered 2023-09-16 – 2023-09-20 (×5): 1 via ORAL
  Filled 2023-09-15 (×5): qty 1

## 2023-09-15 MED ORDER — POLYVINYL ALCOHOL 1.4 % OP SOLN
1.0000 [drp] | Freq: Four times a day (QID) | OPHTHALMIC | Status: DC | PRN
  Filled 2023-09-15: qty 15

## 2023-09-15 MED ORDER — SODIUM CHLORIDE 0.9% FLUSH
3.0000 mL | Freq: Two times a day (BID) | INTRAVENOUS | Status: DC
  Administered 2023-09-15 – 2023-09-20 (×9): 3 mL via INTRAVENOUS

## 2023-09-15 MED ORDER — ACETAMINOPHEN 650 MG RE SUPP: 650.0000 mg | Freq: Four times a day (QID) | RECTAL | Status: DC | PRN

## 2023-09-15 MED ORDER — LACTATED RINGERS IV SOLN: INTRAVENOUS | Status: AC

## 2023-09-15 MED ORDER — POTASSIUM CHLORIDE CRYS ER 10 MEQ PO TBCR
10.0000 meq | EXTENDED_RELEASE_TABLET | Freq: Every day | ORAL | Status: DC
  Administered 2023-09-16 – 2023-09-20 (×5): 10 meq via ORAL
  Filled 2023-09-15 (×5): qty 1

## 2023-09-15 MED ORDER — LEVOTHYROXINE SODIUM 25 MCG PO TABS
125.0000 ug | ORAL_TABLET | Freq: Every day | ORAL | Status: DC
  Administered 2023-09-17 – 2023-09-20 (×4): 125 ug via ORAL
  Filled 2023-09-15 (×4): qty 1

## 2023-09-15 MED ORDER — POLYETHYLENE GLYCOL 3350 17 G PO PACK
17.0000 g | PACK | Freq: Every day | ORAL | Status: DC | PRN
Start: 2023-09-15 — End: 2023-09-21

## 2023-09-15 MED ORDER — KETOCONAZOLE 2 % EX CREA
1.0000 | TOPICAL_CREAM | Freq: Every day | CUTANEOUS | Status: DC
  Administered 2023-09-16 – 2023-09-20 (×5): 1 via TOPICAL
  Filled 2023-09-15 (×2): qty 15

## 2023-09-15 MED ORDER — ACETAMINOPHEN 325 MG PO TABS: 650.0000 mg | ORAL_TABLET | Freq: Four times a day (QID) | ORAL | Status: DC | PRN

## 2023-09-15 MED ORDER — APIXABAN 2.5 MG PO TABS
2.5000 mg | ORAL_TABLET | Freq: Two times a day (BID) | ORAL | Status: DC
  Administered 2023-09-15: 2.5 mg via ORAL
  Filled 2023-09-15: qty 1

## 2023-09-15 MED ORDER — ENOXAPARIN SODIUM 40 MG/0.4ML IJ SOSY
40.0000 mg | PREFILLED_SYRINGE | INTRAMUSCULAR | Status: DC
  Administered 2023-09-16 – 2023-09-18 (×3): 40 mg via SUBCUTANEOUS
  Filled 2023-09-15 (×3): qty 0.4

## 2023-09-15 MED ORDER — ACETAMINOPHEN 325 MG PO TABS
650.0000 mg | ORAL_TABLET | Freq: Four times a day (QID) | ORAL | Status: DC | PRN
  Administered 2023-09-20: 650 mg via ORAL
  Filled 2023-09-15: qty 2

## 2023-09-15 MED ORDER — MIDODRINE HCL 5 MG PO TABS
10.0000 mg | ORAL_TABLET | Freq: Three times a day (TID) | ORAL | Status: DC
  Administered 2023-09-15 – 2023-09-16 (×2): 10 mg via ORAL
  Filled 2023-09-15 (×2): qty 2

## 2023-09-15 NOTE — ED Notes (Signed)
EDP present at [facility]. Pt alert, NAD, calm, interactive, cooperative. Generally weak. Moved from broken stretcher to working Doctor, general practice.

## 2023-09-15 NOTE — ED Triage Notes (Signed)
Pt BIB [facility] from [facility] due to headache that started last night.  Pt did not come to eat this morning.  Pt lethargic.  Staff endorse vomiting at the facility.  None with EMS.   VS BP 120palpated, HR 40's, CBG 163

## 2023-09-15 NOTE — ED Provider Notes (Signed)
[place] EMERGENCY DEPARTMENT AT [facility] Provider Note   CSN: [id number] Arrival date & time: 09/15/23  1250     History  No chief complaint on file.   Katrina [name] is a 89 y.o. female.  The history is provided by the patient and medical records. No language interpreter was used.  Illness Location:  Altered mental status, headache, nausea vomiting. Severity:  Moderate Onset quality:  Gradual Duration:  1 day Timing:  Constant Progression:  Waxing and waning Chronicity:  New Associated symptoms: fatigue, headaches, nausea and vomiting   Associated symptoms: no abdominal pain, no chest pain, no congestion, no cough, no diarrhea, no fever, no loss of consciousness, no rash, no shortness of breath and no wheezing        Home Medications Prior to Admission medications   Medication Sig Start Date End Date Taking? Authorizing Provider  acetaminophen (TYLENOL) 325 MG tablet Take 650 mg by mouth every 6 (six) hours as needed for mild pain.    [provider]  Amino Acids-Protein Hydrolys (PRO-STAT) LIQD Take 30 mLs by mouth in the morning and at bedtime.    [provider]  apixaban (ELIQUIS) 2.5 MG TABS tablet Take 1 tablet by mouth 2 (two) times daily.    [provider]  ciprofloxacin (CIPRO) 250 MG tablet Take 250 mg by mouth. 10/17/21   [provider]  digoxin (LANOXIN) 0.125 MG tablet Take 0.25 mg by mouth daily. Take 2 tablets (0.25 mg) daily 09/08/21   [provider]  furosemide (LASIX) 20 MG tablet Take 20 mg by mouth daily. 02/04/21   [provider]  ketoconazole (NIZORAL) 2 % cream Apply 1 application topically daily. 09/18/21   [provider]  levothyroxine (SYNTHROID) 125 MCG tablet Take 125 mcg by mouth daily.    [provider]  metoprolol succinate (TOPROL-XL) 50 MG 24 hr tablet Take 50 mg by mouth daily. 09/05/21   [provider]  Multiple Vitamins-Minerals (MULTIVITAMIN  WITH MINERALS) tablet Take 1 tablet by mouth daily.    [provider]  potassium chloride (MICRO-K) 10 MEQ CR capsule Take 10 mEq by mouth daily. 09/24/21   [provider]      Allergies    Codeine, Penicillins, and Cephalosporins    Review of Systems   Review of Systems  Constitutional:  Positive for chills and fatigue. Negative for diaphoresis and fever.  HENT:  Negative for congestion.   Eyes:  Negative for visual disturbance.  Respiratory:  Negative for cough, chest tightness, shortness of breath, wheezing and stridor.   Cardiovascular:  Negative for chest pain, palpitations and leg swelling.  Gastrointestinal:  Positive for nausea and vomiting. Negative for abdominal pain, constipation and diarrhea.  Genitourinary:  Negative for decreased urine volume, dysuria and frequency.  Musculoskeletal:  Negative for back pain, neck pain and neck stiffness.  Skin:  Negative for rash and wound.  Neurological:  Positive for headaches. Negative for dizziness, seizures, loss of consciousness, syncope, facial asymmetry and light-headedness.  Psychiatric/Behavioral:  Negative for agitation and confusion.   All other systems reviewed and are negative.   Physical Exam Updated Vital Signs BP (!) 142/87 (BP Location: Right Arm)   Pulse (!) 42   Temp 98.7 F (37.1 C) (Oral)   Resp 16   Ht 5\' 1"  (1.549 m)   Wt 81.6 kg   LMP 01/22/2011   SpO2 100%   BMI 34.01 kg/m  Physical Exam Vitals and nursing note reviewed.  Constitutional:      General: She is not in acute distress.    Appearance: She is well-developed. She is not ill-appearing, toxic-appearing or diaphoretic.  HENT:     Head: Normocephalic and atraumatic.     Nose: No congestion or rhinorrhea.     Mouth/Throat:     Mouth: Mucous membranes are moist.     Pharynx: No oropharyngeal exudate or posterior oropharyngeal erythema.  Eyes:     Conjunctiva/sclera: Conjunctivae normal.  Cardiovascular:     Rate and Rhythm:  Regular rhythm. Bradycardia present.     Pulses: Normal pulses.     Heart sounds: No murmur heard. Pulmonary:     Effort: Pulmonary effort is normal. No respiratory distress.     Breath sounds: Normal breath sounds. No wheezing, rhonchi or rales.  Chest:     Chest wall: No tenderness.  Abdominal:     General: Abdomen is flat. There is no distension.     Palpations: Abdomen is soft.     Tenderness: There is no abdominal tenderness. There is no guarding or rebound.  Musculoskeletal:        General: No swelling or tenderness.     Cervical back: Neck supple. No tenderness.     Right lower leg: No edema.     Left lower leg: No edema.  Skin:    General: Skin is warm and dry.     Capillary Refill: Capillary refill takes less than 2 seconds.     Findings: No erythema or rash.  Neurological:     General: No focal deficit present.     Mental Status: She is alert.     Cranial Nerves: No facial asymmetry.     Sensory: No sensory deficit.     Motor: No weakness or abnormal muscle tone.     Comments: Patient is somnolent but arousable answers questions appropriately.  Psychiatric:        Mood and Affect: Mood normal.     ED Results / Procedures / Treatments   Labs (all labs ordered are listed, but only abnormal results are displayed) Labs Reviewed  COMPREHENSIVE METABOLIC PANEL - Abnormal; Notable for the following components:      Result Value   CO2 21 (*)    Glucose, Bld 136 (*)    Creatinine, Ser 1.09 (*)    GFR, Estimated 49 (*)    All other components within normal limits  RESP PANEL BY RT-PCR (RSV, FLU A&B, COVID)  [id number]  CBC WITH DIFFERENTIAL/PLATELET  LACTIC ACID, PLASMA  TSH  DIGOXIN LEVEL  LIPASE, BLOOD  AMMONIA  LACTIC ACID, PLASMA  URINALYSIS, W/ REFLEX TO CULTURE (INFECTION SUSPECTED)    EKG EKG Interpretation Date/Time:  Thursday September 15 2023 14:07:22 EST Ventricular Rate:  40 PR Interval:  287 QRS Duration:  142 QT Interval:  575 QTC  Calculation: 469 R Axis:   82  Text Interpretation: Sinus bradycardia Prolonged PR interval Right bundle branch block when compared to prior, slower rate. No STEMI Confirmed by Theda Belfast ([id number]) on 09/15/2023 3:09:14 PM  Radiology CT HEAD WO CONTRAST ( ) Result Date: 09/15/2023 CLINICAL DATA:  Altered mental status. EXAM: CT HEAD WITHOUT CONTRAST TECHNIQUE: Contiguous axial images were obtained from the base of the skull through the vertex without intravenous contrast. RADIATION DOSE REDUCTION: This exam was performed according to the departmental dose-optimization program which includes automated exposure control, adjustment of the mA and/or kV according to patient size and/or use of iterative reconstruction technique. COMPARISON:  CT  head dated May 10, 2021. FINDINGS: Brain: No evidence of acute infarction, hemorrhage, hydrocephalus, extra-axial collection or mass lesion/mass effect. Stable atrophy and moderate chronic microvascular ischemic changes. Vascular: Atherosclerotic vascular calcification of the carotid siphons. No hyperdense vessel. Skull: Normal. Negative for fracture or focal lesion. Sinuses/Orbits: Complete opacification the right maxillary sinus. Other: None. IMPRESSION: 1. No acute intracranial abnormality. 2. Stable atrophy and moderate chronic microvascular ischemic changes. 3. Right maxillary sinusitis. Electronically Signed   By: Obie Dredge M.D.   On: 09/15/2023 15:51   DG Chest Portable 1 View Result Date: 09/15/2023 CLINICAL DATA:  Altered mental status.  Chills. EXAM: PORTABLE CHEST 1 VIEW COMPARISON:  09/26/2021. FINDINGS: Low lung volume. Apparent increased interstitial markings are likely secondary to low lung volume. No frank pulmonary edema. Bilateral lung fields are otherwise clear. There is apparent blunting of bilateral lateral costophrenic angles, which may be due to overlying soft tissue versus subtle pleural effusion/pleural thickening. Consider lateral  radiograph versus ultrasound, if differentiation is desired. Stable cardio-mediastinal silhouette. No acute osseous abnormalities. The soft tissues are within normal limits. IMPRESSION: *Low lung volume. Apparent blunting of bilateral lateral costophrenic angles, as discussed above. Electronically Signed   By: Jules Schick M.D.   On: 09/15/2023 14:20    Procedures Procedures    Medications Ordered in ED Medications - No data to display  ED Course/ Medical Decision Making/ A&P                                 Medical Decision Making Amount and/or Complexity of Data Reviewed Labs: ordered. Radiology: ordered.    Katrina [name] is a 89 y.o. female with a past medical history significant for hypertension, hypothyroidism, paroxysmal atrial fibrillation on Eliquis therapy, cirrhosis of liver, previous pulmonary emboli, previous DVT, gallstones status postcholecystectomy, vertigo, and previous metabolic encephalopathy [facility], vomiting, headache, and altered mental status.  According to EMS, patient this morning was difficult to arouse and has been very somnolent compared to her baseline.  She did not come down for breakfast and was found to have dried vomit all over herself.  Patient is able to answer questions when she is woken up and is denying significant abdominal pain.  Denies chest pain.  Denies shortness of breath.  Reports she is not having nausea or vomiting now but agrees she did earlier.  She is still reporting headache but denies neck pain or neck stiffness.  She does report some chills.  She denies any congestion or cough.  Denies any urinary changes.  Denies new leg pain or leg swelling.  Reports she just feels tired and chills.  EMS reports that there have been viral illnesses going through the community.  On exam, lungs were clear.  Chest nontender.  Abdomen nontender.  Bowel sounds appreciated.  Patient has intact sensation  and strength in all extremities.  Symmetric smile.  Clear speech.  Pupils symmetric and reactive with normal extraocular movements.  Patient is somnolent but easily woken up to discuss symptoms.  EMS reported her glucose was in the 160s.  Given the patient's chills and altered mental status will get workup to look for occult infection.  Will get chest x-ray, urinalysis, and check her for viral illness.  Will get screening labs.  We will get a CT of the head given the headache and nausea and vomiting.  Given the history of encephalopathy  will check liver function and pneumonia.  Will check a TSH.  Will get a digoxin level as she is bradycardic.  Anticipate reassessment after workup to determine disposition.  Given her lack of any neck pain or neck stiffness or fever, have low suspicion for meningitis at this time.  Do not feel she needs an LP currently.  Anticipate reassessment after workup to determine disposition.  Patient's CT head returned without acute traumatic injury or evidence of acute stroke.  Negative for COVID/flu/RSV.  CBC and CMP grossly unremarkable.  Digoxin level 1.0.  Lipase not elevated.  Ammonia elevated.  TSH normal.  Lactic acid normal.  Patient waiting for results of urinalysis given the urinary symptoms and her concern for possible UTI.  This may be the cause of the patient's altered mental status.  Care transferred oncoming team to wait for results of urinalysis and reassessment.  If patient is still altered, she may need admission.         Final Clinical Impression(s) / ED Diagnoses Final diagnoses:  Falls  Altered mental status, unspecified altered mental status type     Clinical Impression: 1. Falls   2. Altered mental status, unspecified altered mental status type     Disposition: Admit  This note was prepared with assistance of Dragon voice recognition software. Occasional wrong-word or sound-a-like substitutions may have occurred due to the inherent  limitations of voice recognition software.      Isaias Dowson, Canary Brim, MD 09/15/23 [phone]

## 2023-09-15 NOTE — Consult Note (Signed)
Referring Physician: Arby Barrette, MD/Hersh Maryjean Ka, MD  Katrina [name] is an 89 y.o. female.                       Chief Complaint: Bradycardia  HPI: 89 years old white female with PMH of atrial fibrillation, DVT, HTN, Hypothyroidism, h/o Pulmonary embolism has altered mental status at area NH. She was found to be bradycardic and was sent to ER for work-up. EKG in ER shows sinus rhyth with 2nd degree AV block type 1 with HR in 30-40's.  Patient takes Eliquis, Levothyroxine, metoprolol and digoxin.  Her Digoxin level is normal at 1.0 ng.  Her TSH, CBC and CMET are mostly unremarkable. CXR was unremarkable. CT head was negative for acute intracranial abnormality and stable atrophy of brain + Right maxillary sinusitis.  Past Medical History:  Diagnosis Date   Atrial fibrillation (HCC)    DVT (deep venous thrombosis) (HCC)    Elevated lipids    Gallstone    Hypertension    Hypothyroidism    Nephrolithiasis    Overactive bladder    Pulmonary emboli (HCC)    while on HRT   SCC (squamous cell carcinoma)    of left leg   Sleep apnea    on C-pap   Vertigo       Past Surgical History:  Procedure Laterality Date   CHOLECYSTECTOMY     Dr. Freida Busman   CYSTOSCOPY W/ URETERAL STENT PLACEMENT Right 09/26/2021   Procedure: CYSTOSCOPY WITH RETROGRADE PYELOGRAM/URETERAL STENT PLACEMENT;  Surgeon: Jerilee Field, MD;  Location: [facility];  Service: Urology;  Laterality: Right;   CYSTOSCOPY WITH STENT PLACEMENT  9/12   [email or web address]   CYSTOSCOPY/URETEROSCOPY/HOLMIUM LASER/STENT PLACEMENT Right 10/20/2021   Procedure: CYSTOSCOPY RIGHT URETEROSCOPY/HOLMIUM LASER/STENT EXCHANGE;  Surgeon: Bjorn Pippin, MD;  Location: [facility];  Service: Urology;  Laterality: Right;   HYSTEROSCOPY WITH D & C  7/09   PMP with endometrial polyp   REPLACEMENT TOTAL KNEE Bilateral 1998, 2001       TONSILLECTOMY AND ADENOIDECTOMY      Family History  Problem Relation Age of Onset   Hypertension Father    Heart disease  Father    Hypertension Mother    Hyperlipidemia Mother    Heart attack Mother    Prostate cancer Brother    Social History:  reports that she has never smoked. She has never used smokeless tobacco. She reports that she does not drink alcohol and does not use drugs.  Allergies:  Allergies  Allergen Reactions   Codeine     Other reaction(s): nausea   Penicillins     Mother and brother have allergy, patient just does not take this Did PCN reaction causing immediate rash, facial/tongue/throat swelling, SOB or lightheadedness with hypotension: unknown Did PCN reaction causing severe rash involving mucus membranes or skin necrosis:unknown Did PCN reaction that required hospitalization : unknown Did PCN reaction occurring within the last 10 years: unknown Pt never actually took med. Did okay with ampicillin   Cephalosporins Rash    Dermatitis to keflex in 2012- Has tolerated several courses since that time-  Most recently 2021. Notably patient has stasis dermatitis that developed around the same time.     (Not in a hospital admission)   Results for orders placed or performed during the hospital encounter of 09/15/23 (from the past 48 hours)  Resp panel by RT-PCR (RSV, Flu A&B, Covid) Anterior Nasal Swab     Status: None   Collection  Time: 09/15/23  1:00 PM   Specimen: Anterior Nasal Swab  Result Value Ref Range   SARS Coronavirus 2 by RT PCR NEGATIVE NEGATIVE   Influenza A by PCR NEGATIVE NEGATIVE   Influenza B by PCR NEGATIVE NEGATIVE    Comment: (NOTE) The Xpert Xpress SARS-CoV-2/FLU/RSV plus assay is intended as an aid in the diagnosis of influenza from Nasopharyngeal swab specimens and should not be used as a sole basis for treatment. Nasal washings and aspirates are unacceptable for Xpert Xpress SARS-CoV-2/FLU/RSV testing.  Fact Sheet for Patients: BloggerCourse.com  Fact Sheet for Healthcare  Providers: SeriousBroker.it  This test is not yet approved or cleared by the [place] FDA and has been authorized for detection and/or diagnosis of SARS-CoV-2 by FDA under an Emergency Use Authorization (EUA). This EUA will remain in effect (meaning this test can be used) for the duration of the COVID-19 declaration under Section 564(b)(1) of the Act, 21 U.S.C. section 360bbb-3(b)(1), unless the authorization is terminated or revoked.     Resp Syncytial Virus by PCR NEGATIVE NEGATIVE    Comment: (NOTE) Fact Sheet for Patients: BloggerCourse.com  Fact Sheet for Healthcare Providers: SeriousBroker.it  This test is not yet approved or cleared by the [place] FDA and has been authorized for detection and/or diagnosis of SARS-CoV-2 by FDA under an Emergency Use Authorization (EUA). This EUA will remain in effect (meaning this test can be used) for the duration of the COVID-19 declaration under Section 564(b)(1) of the Act, 21 U.S.C. section 360bbb-3(b)(1), unless the authorization is terminated or revoked.  Performed at [facility], [address], [place], [place] [address]   CBC with Differential     Status: None   Collection Time: 09/15/23  1:15 PM  Result Value Ref Range   WBC 8.4 4.0 - 10.5 K/uL   RBC 4.36 3.87 - 5.11 MIL/uL   Hemoglobin 12.9 12.0 - 15.0 g/dL   HCT 60.4 54.0 - 98.1 %   MCV 89.0 80.0 - 100.0 fL   MCH 29.6 26.0 - 34.0 pg   MCHC 33.2 30.0 - 36.0 g/dL   RDW 19.1 47.8 - 29.5 %   Platelets 185 150 - 400 K/uL   nRBC 0.0 0.0 - 0.2 %   Neutrophils Relative % 86 %   Neutro Abs 7.2 1.7 - 7.7 K/uL   Lymphocytes Relative 11 %   Lymphs Abs 0.9 0.7 - 4.0 K/uL   Monocytes Relative 3 %   Monocytes Absolute 0.2 0.1 - 1.0 K/uL   Eosinophils Relative 0 %   Eosinophils Absolute 0.0 0.0 - 0.5 K/uL   Basophils Relative 0 %   Basophils Absolute 0.0 0.0 - 0.1 K/uL   Immature  Granulocytes 0 %   Abs Immature Granulocytes 0.03 0.00 - 0.07 K/uL    Comment: Performed at [facility], [address], [place], [place] [address]  Lactic acid, plasma     Status: None   Collection Time: 09/15/23  1:15 PM  Result Value Ref Range   Lactic Acid, Venous 1.4 0.5 - 1.9 mmol/L    Comment: Performed at [facility], [address], [place], [place] [address]  TSH     Status: None   Collection Time: 09/15/23  1:15 PM  Result Value Ref Range   TSH 0.590 0.350 - 4.500 uIU/mL    Comment: Performed by a 3rd Generation assay with a functional sensitivity of <=0.01 uIU/mL. Performed at [facility], [address], [place], [place] [address]   Digoxin level     Status: None   Collection Time: 09/15/23  1:15 PM  Result Value Ref Range   Digoxin Level 1.0 0.8 - 2.0 ng/mL    Comment: Performed at [facility], [address] [place], [place] [address]  Comprehensive metabolic panel     Status: Abnormal   Collection Time: 09/15/23  1:30 PM  Result Value Ref Range   Sodium 137 135 - 145 mmol/L   Potassium 4.6 3.5 - 5.1 mmol/L   Chloride 106 98 - 111 mmol/L   CO2 21 (L) 22 - 32 mmol/L   Glucose, Bld 136 (H) 70 - 99 mg/dL    Comment: Glucose reference range applies only to samples taken after fasting for at least 8 hours.   BUN 20 8 - 23 mg/dL   Creatinine, Ser 8.29 (H) 0.44 - 1.00 mg/dL   Calcium 9.2 8.9 - 56.2 mg/dL   Total Protein 8.0 6.5 - 8.1 g/dL   Albumin 3.6 3.5 - 5.0 g/dL   AST 27 15 - 41 U/L   ALT 17 0 - 44 U/L   Alkaline Phosphatase 83 38 - 126 U/L   Total Bilirubin 0.8 0.0 - 1.2 mg/dL   GFR, Estimated 49 (L) >60 mL/min    Comment: (NOTE) Calculated using the CKD-EPI Creatinine Equation (2021)    Anion gap 10 5 - 15    Comment: Performed at [facility], [address] [place], [place] [address]  Lipase, blood     Status: None   Collection Time: 09/15/23  1:30 PM  Result Value Ref Range   Lipase 40 11 - 51 U/L    Comment:  Performed at [facility], [address] [place], [place] [address]  Ammonia     Status: None   Collection Time: 09/15/23  3:15 PM  Result Value Ref Range   Ammonia <10 9 - 35 umol/L    Comment: Performed at [facility], [address] [place], [place] [address]   CT HEAD WO CONTRAST ( ) Result Date: 09/15/2023 CLINICAL DATA:  Altered mental status. EXAM: CT HEAD WITHOUT CONTRAST TECHNIQUE: Contiguous axial images were obtained from the base of the skull through the vertex without intravenous contrast. RADIATION DOSE REDUCTION: This exam was performed according to the departmental dose-optimization program which includes automated exposure control, adjustment of the mA and/or kV according to patient size and/or use of iterative reconstruction technique. COMPARISON:  CT head dated May 10, 2021. FINDINGS: Brain: No evidence of acute infarction, hemorrhage, hydrocephalus, extra-axial collection or mass lesion/mass effect. Stable atrophy and moderate chronic microvascular ischemic changes. Vascular: Atherosclerotic vascular calcification of the carotid siphons. No hyperdense vessel. Skull: Normal. Negative for fracture or focal lesion. Sinuses/Orbits: Complete opacification the right maxillary sinus. Other: None. IMPRESSION: 1. No acute intracranial abnormality. 2. Stable atrophy and moderate chronic microvascular ischemic changes. 3. Right maxillary sinusitis. Electronically Signed   By: Obie Dredge M.D.   On: 09/15/2023 15:51   DG Chest Portable 1 View Result Date: 09/15/2023 CLINICAL DATA:  Altered mental status.  Chills. EXAM: PORTABLE CHEST 1 VIEW COMPARISON:  09/26/2021. FINDINGS: Low lung volume. Apparent increased interstitial markings are likely secondary to low lung volume. No frank pulmonary edema. Bilateral lung fields are otherwise clear. There is apparent blunting of bilateral lateral costophrenic angles, which may be due to overlying soft tissue versus subtle pleural  effusion/pleural thickening. Consider lateral radiograph versus ultrasound, if  differentiation is desired. Stable cardio-mediastinal silhouette. No acute osseous abnormalities. The soft tissues are within normal limits. IMPRESSION: *Low lung volume. Apparent blunting of bilateral lateral costophrenic angles, as discussed above. Electronically Signed   By: Jules Schick M.D.   On: 09/15/2023 14:20    Review Of Systems Constitutional: No fever, chills, weight loss or gain. Eyes: No vision change, wears glasses. No discharge or pain. Ears: No hearing loss, No tinnitus. Respiratory: No asthma, COPD, pneumonias. No shortness of breath. No hemoptysis. Cardiovascular: Positive chest pain, palpitation, leg edema. Gastrointestinal: No nausea, vomiting, diarrhea, constipation. No GI bleed. No hepatitis. Genitourinary: No dysuria, hematuria, kidney stone. No incontinance. Neurological: No headache, stroke, seizures.  Psychiatry: No psych facility admission for anxiety, depression, suicide. No detox. Skin: No rash. Musculoskeletal: Positive joint pain, fibromyalgia. No neck pain, back pain. Lymphadenopathy: No lymphadenopathy. Hematology: No anemia or easy bruising.   Blood pressure (!) 127/112, pulse (!) 37, temperature 97.7 F (36.5 C), temperature source Oral, resp. rate 14, height 5\' 1"  (1.549 m), weight 81.6 kg, last menstrual period 01/22/2011, SpO2 98%. Body mass index is 34.01 kg/m. General appearance: awake, cooperative, appears stated age and no distress Head: Normocephalic, atraumatic. Eyes: Blue eyes, pink conjunctiva, corneas clear.  Neck: No adenopathy, no carotid bruit, no JVD, supple, symmetrical, trachea midline and thyroid not enlarged. Resp: Clear to auscultation bilaterally. Cardio: Regular rate and rhythm, S1, S2 normal, II/VI systolic murmur, no click, rub or gallop GI: Soft, non-tender; bowel sounds normal; no organomegaly. Extremities: 1 + lower leg edema, no cyanosis or  clubbing. Venous stasis discoloration and possible fungal infection. Skin: Warm and dry.  Neurologic: Alert and oriented X 1, normal strength. Normal coordination.  Assessment/Plan Sinus rhythm with 2nd degree AV block, type 1 Altered mental status  HTN Hypothyroidism Type 2 DM H/O DVT H/O Pulmonary embolism H/O atrial fibrillation  Plan: Discontinue digoxin and hold metoprolol for now. May use Losartan, amlodipine or hydralazine for BP control.  Time spent: Review of old records, Lab, x-rays, EKG, other cardiac tests, examination, discussion with patient over 70 minutes.  Ricki Rodriguez, MD  09/15/2023, 8:56 PM

## 2023-09-15 NOTE — Assessment & Plan Note (Signed)
Patient has prior known paroxysmal atrial fibrillation.  Patient is currently in sinus rhythm with AV nodal block.  I will hold patient's apixaban as it is possible patient may potentially need an in his invasive workup.

## 2023-09-15 NOTE — ED Provider Notes (Signed)
Somnolence and vomited earlier, dysuria. F/U labs.  Physical Exam  BP (!) 155/47   Pulse (!) 33   Temp 97.7 F (36.5 C) (Oral)   Resp 10   Ht 5\' 1"  (1.549 m)   Wt 81.6 kg   LMP 01/22/2011   SpO2 98%   BMI 34.01 kg/m   Physical Exam  Procedures  Procedures  ED Course / MDM    Medical Decision Making Amount and/or Complexity of Data Reviewed Labs: ordered. Radiology: ordered.  Risk Decision regarding hospitalization.   No significant acute findings on lab work.  Evaluation of monitor however shows frequent and mostly sustained bradycardia in the 30s.  Patient remains easily arousable and conversant with heart rate in the 30s.  She is not experiencing chest pain or dyspnea.  Blood pressures are hypertensive.  Patient does take beta-blocker.  At this time I have suspicion for bradycardia as etiology of somnolence and weakness.  Will admit for monitoring and further evaluation.       Arby Barrette, MD 09/18/23 1041

## 2023-09-15 NOTE — ED Notes (Signed)
Dark green on ice re-sent

## 2023-09-15 NOTE — ED Notes (Signed)
To CT, alert, NAD, calm, interactive.

## 2023-09-15 NOTE — Assessment & Plan Note (Signed)
In the setting of metoprolol use at home which will be held.  TSH within normal limits.  Echo is pending.  I believe this is symptomatic given that the patient's diastolic blood pressure is below 60 mmHg on several occasions.  I suspect patient is having mild metabolic encephalopathy because of this. Patient also takes digoxin. Which will need to be held. I will treat the tpaetin with midodrine, to get the Diastolic bp closer to . Transfer to progressive.

## 2023-09-15 NOTE — ED Notes (Signed)
EKG in progress

## 2023-09-15 NOTE — ED Notes (Signed)
Difficulty with IV and labs. IV established, blood sent. Xray into room.

## 2023-09-15 NOTE — H&P (Signed)
History and Physical    Patient: Katrina [name] ZOX:[id number] DOB: 05/13/1932 DOA: 09/15/2023 DOS: the patient was seen and examined on 09/15/2023 PCP: Mast, Man X, NP  Patient coming from:  "friends home"  Chief Complaint: No chief complaint on file.  HPI: Katrina [name] is a 89 y.o. female with medical history significant of PAF on Eliquis, history of DVT and PE, history of renal stones, of hypertension. I did not find a documented dementia diagnosis. However, apteint is not aware of why she is at [facility] today.  Per report, patient has had headache since last evening, Pt did not come to eat this morning. Pt lethargic. Staff endorse vomiting at the facility. None with EMS.   No report of fever, diarrhea, abd pain, rash on skin. Patinet is feeling pretty good at thsi time.  Work up in ER - CT head non actionable. Teleemtry monitoring shows - second Degree AV block. Patient takes metoprolol at home.  BP has been non actionable in the ER. Diastolic BP as low as 36.  Review of Systems: unable to review all systems due to the inability of the patient to answer questions. Past Medical History:  Diagnosis Date   Atrial fibrillation (HCC)    DVT (deep venous thrombosis) (HCC)    Elevated lipids    Gallstone    Hypertension    Hypothyroidism    Nephrolithiasis    Overactive bladder    Pulmonary emboli (HCC)    while on HRT   SCC (squamous cell carcinoma)    of left leg   Sleep apnea    on C-pap   Vertigo    Past Surgical History:  Procedure Laterality Date   CHOLECYSTECTOMY     Dr. Freida Busman   CYSTOSCOPY W/ URETERAL STENT PLACEMENT Right 09/26/2021   Procedure: CYSTOSCOPY WITH RETROGRADE PYELOGRAM/URETERAL STENT PLACEMENT;  Surgeon: Jerilee Field, MD;  Location: [facility];  Service: Urology;  Laterality: Right;   CYSTOSCOPY WITH STENT PLACEMENT  9/12   nephrolithaisis   CYSTOSCOPY/URETEROSCOPY/HOLMIUM LASER/STENT PLACEMENT Right 10/20/2021   Procedure: CYSTOSCOPY RIGHT URETEROSCOPY/HOLMIUM  LASER/STENT EXCHANGE;  Surgeon: Bjorn Pippin, MD;  Location: [facility];  Service: Urology;  Laterality: Right;   HYSTEROSCOPY WITH D & C  7/09   PMP with endometrial polyp   REPLACEMENT TOTAL KNEE Bilateral 1998, 2001       TONSILLECTOMY AND ADENOIDECTOMY     Social History:  reports that she has never smoked. She has never used smokeless tobacco. She reports that she does not drink alcohol and does not use drugs.  Allergies  Allergen Reactions   Codeine     Other reaction(s): nausea   Penicillins     Mother and brother have allergy, patient just does not take this Did PCN reaction causing immediate rash, facial/tongue/throat swelling, SOB or lightheadedness with hypotension: unknown Did PCN reaction causing severe rash involving mucus membranes or skin necrosis:unknown Did PCN reaction that required hospitalization : unknown Did PCN reaction occurring within the last 10 years: unknown Pt never actually took med. Did okay with ampicillin   Cephalosporins Rash    Dermatitis to keflex in 2012- Has tolerated several courses since that time-  Most recently 2021. Notably patient has stasis dermatitis that developed around the same time.     Family History  Problem Relation Age of Onset   Hypertension Father    Heart disease Father    Hypertension Mother    Hyperlipidemia Mother    Heart attack Mother    Prostate  cancer Brother     Prior to Admission medications   Medication Sig Start Date End Date Taking? Authorizing Provider  acetaminophen (TYLENOL) 325 MG tablet Take 650 mg by mouth every 6 (six) hours as needed for mild pain.   Yes [provider]  apixaban (ELIQUIS) 2.5 MG TABS tablet Take 1 tablet by mouth 2 (two) times daily.   Yes [provider]  ketoconazole (NIZORAL) 2 % cream Apply 1 application topically daily. 09/18/21  Yes [provider]  levothyroxine (SYNTHROID) 125 MCG tablet Take 125 mcg by mouth daily.   Yes [provider]   metoprolol succinate (TOPROL-XL) 50 MG 24 hr tablet Take 50 mg by mouth daily. 09/05/21  Yes [provider]  Multiple Vitamins-Minerals (PRESERVISION AREDS 2 PO) Take 1 tablet by mouth daily.   Yes [provider]  polyvinyl alcohol (LIQUIFILM TEARS) 1.4 % ophthalmic solution Place 1 drop into both eyes 4 (four) times daily as needed for dry eyes.   Yes [provider]  potassium chloride (MICRO-K) 10 MEQ CR capsule Take 10 mEq by mouth daily. 09/24/21  Yes [provider]  Prenatal 28-0.8 MG TABS Take 1 tablet by mouth daily.   Yes [provider]    Physical Exam: Vitals:   09/15/23 1840 09/15/23 1845 09/15/23 1900 09/15/23 2114  BP: (!) 147/50 (!) 127/112  (!) 145/36  Pulse: (!) 38 (!) 38 (!) 37 (!) 37  Resp: 16 17 14 16   Temp:    97.7 F (36.5 C)  TempSrc:    Oral  SpO2: 99% 99% 98% 95%  Weight:      Height:       General: Patient is alert and awake appears to be in no distress.  However is not able to advise why she is in the hospital.  She does know where she is at which is [facility] in [place].  Unable to give me date or time of the day. Respiratory exam: Bilateral intravesicular Cardiovascular exam S1-S2 normal, bradycardia, systolic murmur is noted Abdomen soft nontender Extremities warm without edema without any focal deficit. Data Reviewed:  Labs on Admission:  Results for orders placed or performed during the hospital encounter of 09/15/23 (from the past 24 hours)  Resp panel by RT-PCR (RSV, Flu A&B, Covid) Anterior Nasal Swab     Status: None   Collection Time: 09/15/23  1:00 PM   Specimen: Anterior Nasal Swab  Result Value Ref Range   SARS Coronavirus 2 by RT PCR NEGATIVE NEGATIVE   Influenza A by PCR NEGATIVE NEGATIVE   Influenza B by PCR NEGATIVE NEGATIVE   Resp Syncytial Virus by PCR NEGATIVE NEGATIVE  CBC with Differential     Status: None   Collection Time: 09/15/23  1:15 PM  Result Value Ref Range   WBC 8.4  4.0 - 10.5 K/uL   RBC 4.36 3.87 - 5.11 MIL/uL   Hemoglobin 12.9 12.0 - 15.0 g/dL   HCT 40.9 81.1 - 91.4 %   MCV 89.0 80.0 - 100.0 fL   MCH 29.6 26.0 - 34.0 pg   MCHC 33.2 30.0 - 36.0 g/dL   RDW 78.2 95.6 - 21.3 %   Platelets 185 150 - 400 K/uL   nRBC 0.0 0.0 - 0.2 %   Neutrophils Relative % 86 %   Neutro Abs 7.2 1.7 - 7.7 K/uL   Lymphocytes Relative 11 %   Lymphs Abs 0.9 0.7 - 4.0 K/uL   Monocytes Relative 3 %  Monocytes Absolute 0.2 0.1 - 1.0 K/uL   Eosinophils Relative 0 %   Eosinophils Absolute 0.0 0.0 - 0.5 K/uL   Basophils Relative 0 %   Basophils Absolute 0.0 0.0 - 0.1 K/uL   Immature Granulocytes 0 %   Abs Immature Granulocytes 0.03 0.00 - 0.07 K/uL  Lactic acid, plasma     Status: None   Collection Time: 09/15/23  1:15 PM  Result Value Ref Range   Lactic Acid, Venous 1.4 0.5 - 1.9 mmol/L  TSH     Status: None   Collection Time: 09/15/23  1:15 PM  Result Value Ref Range   TSH 0.590 0.350 - 4.500 uIU/mL  Digoxin level     Status: None   Collection Time: 09/15/23  1:15 PM  Result Value Ref Range   Digoxin Level 1.0 0.8 - 2.0 ng/mL  Comprehensive metabolic panel     Status: Abnormal   Collection Time: 09/15/23  1:30 PM  Result Value Ref Range   Sodium 137 135 - 145 mmol/L   Potassium 4.6 3.5 - 5.1 mmol/L   Chloride 106 98 - 111 mmol/L   CO2 21 (L) 22 - 32 mmol/L   Glucose, Bld 136 (H) 70 - 99 mg/dL   BUN 20 8 - 23 mg/dL   Creatinine, Ser 3.08 (H) 0.44 - 1.00 mg/dL   Calcium 9.2 8.9 - 65.7 mg/dL   Total Protein 8.0 6.5 - 8.1 g/dL   Albumin 3.6 3.5 - 5.0 g/dL   AST 27 15 - 41 U/L   ALT 17 0 - 44 U/L   Alkaline Phosphatase 83 38 - 126 U/L   Total Bilirubin 0.8 0.0 - 1.2 mg/dL   GFR, Estimated 49 (L) >60 mL/min   Anion gap 10 5 - 15  Lipase, blood     Status: None   Collection Time: 09/15/23  1:30 PM  Result Value Ref Range   Lipase 40 11 - 51 U/L  Ammonia     Status: None   Collection Time: 09/15/23  3:15 PM  Result Value Ref Range   Ammonia <10 9 - 35  umol/L   Basic Metabolic Panel: Recent Labs  Lab 09/15/23 1330  NA 137  K 4.6  CL 106  CO2 21*  GLUCOSE 136*  BUN 20  CREATININE 1.09*  CALCIUM 9.2   Liver Function Tests: Recent Labs  Lab 09/15/23 1330  AST 27  ALT 17  ALKPHOS 83  BILITOT 0.8  PROT 8.0  ALBUMIN 3.6   Recent Labs  Lab 09/15/23 1330  LIPASE 40   Recent Labs  Lab 09/15/23 1515  AMMONIA <10   CBC: Recent Labs  Lab 09/15/23 1315  WBC 8.4  NEUTROABS 7.2  HGB 12.9  HCT 38.8  MCV 89.0  PLT 185   Cardiac Enzymes: No results for input(s): "CKTOTAL", "CKMB", "CKMBINDEX", "TROPONINIHS" in the last 168 hours.  BNP (last 3 results) No results for input(s): "PROBNP" in the last 8760 hours. CBG: No results for input(s): "GLUCAP" in the last 168 hours.  Radiological Exams on Admission:  CT HEAD WO CONTRAST ( ) Result Date: 09/15/2023 CLINICAL DATA:  Altered mental status. EXAM: CT HEAD WITHOUT CONTRAST TECHNIQUE: Contiguous axial images were obtained from the base of the skull through the vertex without intravenous contrast. RADIATION DOSE REDUCTION: This exam was performed according to the departmental dose-optimization program which includes automated exposure control, adjustment of the mA and/or kV according to patient size and/or use of iterative reconstruction technique. COMPARISON:  CT head dated May 10, 2021. FINDINGS: Brain: No evidence of acute infarction, hemorrhage, hydrocephalus, extra-axial collection or mass lesion/mass effect. Stable atrophy and moderate chronic microvascular ischemic changes. Vascular: Atherosclerotic vascular calcification of the carotid siphons. No hyperdense vessel. Skull: Normal. Negative for fracture or focal lesion. Sinuses/Orbits: Complete opacification the right maxillary sinus. Other: None. IMPRESSION: 1. No acute intracranial abnormality. 2. Stable atrophy and moderate chronic microvascular ischemic changes. 3. Right maxillary sinusitis. Electronically  Signed   By: Obie Dredge M.D.   On: 09/15/2023 15:51   DG Chest Portable 1 View Result Date: 09/15/2023 CLINICAL DATA:  Altered mental status.  Chills. EXAM: PORTABLE CHEST 1 VIEW COMPARISON:  09/26/2021. FINDINGS: Low lung volume. Apparent increased interstitial markings are likely secondary to low lung volume. No frank pulmonary edema. Bilateral lung fields are otherwise clear. There is apparent blunting of bilateral lateral costophrenic angles, which may be due to overlying soft tissue versus subtle pleural effusion/pleural thickening. Consider lateral radiograph versus ultrasound, if differentiation is desired. Stable cardio-mediastinal silhouette. No acute osseous abnormalities. The soft tissues are within normal limits. IMPRESSION: *Low lung volume. Apparent blunting of bilateral lateral costophrenic angles, as discussed above. Electronically Signed   By: Jules Schick M.D.   On: 09/15/2023 14:20     EKG: Independently reviewed. Sinus brady with 2:1 second degree AV nodal block.  No intake/output data recorded. No intake/output data recorded.   Assessment and Plan: Second degree AV block In the setting of metoprolol use at home which will be held.  TSH within normal limits.  Echo is pending.  I believe this is symptomatic given that the patient's diastolic blood pressure is below 60 mmHg on several occasions.  I suspect patient is having mild metabolic encephalopathy because of this. Patient also takes digoxin. Which will need to be held. I will treat the tpaetin with midodrine, to get the Diastolic bp closer to . Transfer to progressive.   Supraventricular tachycardia (HCC) Patient has prior known paroxysmal atrial fibrillation.  Patient is currently in sinus rhythm with AV nodal block.  I will hold patient's apixaban as it is possible patient may potentially need an in his invasive workup.      Advance Care Planning:   Code Status: Prior full code.  Consults: cardiology  consult in chart. Appreciated.  Family Communication: called sister in law at [phone]: left voicemail with call back instructions.  Severity of Illness: The appropriate patient status for this patient is INPATIENT. Inpatient status is judged to be reasonable and necessary in order to provide the required intensity of service to ensure the patient's safety. The patient's presenting symptoms, physical exam findings, and initial radiographic and laboratory data in the context of their chronic comorbidities is felt to place them at high risk for further clinical deterioration. Furthermore, it is not anticipated that the patient will be medically stable for discharge from the hospital within 2 midnights of admission.   * I certify that at the point of admission it is my clinical judgment that the patient will require inpatient hospital care spanning beyond 2 midnights from the point of admission due to high intensity of service, high risk for further deterioration and high frequency of surveillance required.*  Author: Nolberto Hanlon, MD 09/15/2023 9:42 PM  For on call review [email or web address].

## 2023-09-16 ENCOUNTER — Inpatient Hospital Stay (HOSPITAL_COMMUNITY): Payer: Medicare Other

## 2023-09-16 ENCOUNTER — Encounter (HOSPITAL_COMMUNITY)
Admission: EM | Disposition: A | Discharge status: Home / Self Care | Payer: Self-pay | Source: Skilled Nursing Facility | Attending: Internal Medicine

## 2023-09-16 DIAGNOSIS — R001 Bradycardia, unspecified: Secondary | ICD-10-CM

## 2023-09-16 HISTORY — PX: TEMPORARY PACEMAKER: CATH118268

## 2023-09-16 LAB — BASIC METABOLIC PANEL
Anion gap: 11 (ref 5–15)
BUN: 25 mg/dL — ABNORMAL HIGH (ref 8–23)
CO2: 21 mmol/L — ABNORMAL LOW (ref 22–32)
Calcium: 8.9 mg/dL (ref 8.9–10.3)
Chloride: 107 mmol/L (ref 98–111)
Creatinine, Ser: 1.15 mg/dL — ABNORMAL HIGH (ref 0.44–1.00)
GFR, Estimated: 46 mL/min — ABNORMAL LOW (ref >60)
Glucose, Bld: 86 mg/dL (ref 70–99)
Potassium: 3.8 mmol/L (ref 3.5–5.1)
Sodium: 139 mmol/L (ref 135–145)

## 2023-09-16 LAB — CBC
HCT: 39.1 % (ref 36.0–46.0)
Hemoglobin: 12.7 g/dL (ref 12.0–15.0)
MCH: 29.1 pg (ref 26.0–34.0)
MCHC: 32.5 g/dL (ref 30.0–36.0)
MCV: 89.7 fL (ref 80.0–100.0)
Platelets: 135 10*3/uL — ABNORMAL LOW (ref 150–400)
RBC: 4.36 MIL/uL (ref 3.87–5.11)
RDW: 14.5 % (ref 11.5–15.5)
WBC: 6.9 10*3/uL (ref 4.0–10.5)
nRBC: 0 % (ref 0.0–0.2)

## 2023-09-16 LAB — ECHOCARDIOGRAM COMPLETE
AR max vel: 1.49 cm2
AV Area VTI: 1.54 cm2
AV Area mean vel: 1.59 cm2
AV Mean grad: 9 mm[Hg]
AV Peak grad: 19.3 mm[Hg]
Ao pk vel: 2.2 m/s
Area-P 1/2: 2.36 cm2
Height: 61 in
S' Lateral: 3.4 cm
Weight: 2880 [oz_av]

## 2023-09-16 LAB — PROTIME-INR
INR: 1.5 — ABNORMAL HIGH (ref 0.8–1.2)
Prothrombin Time: 18.5 s — ABNORMAL HIGH (ref 11.4–15.2)

## 2023-09-16 LAB — URINALYSIS, ROUTINE W REFLEX MICROSCOPIC
Bilirubin Urine: NEGATIVE
Glucose, UA: NEGATIVE mg/dL
Hgb urine dipstick: NEGATIVE
Ketones, ur: NEGATIVE mg/dL
Leukocytes,Ua: NEGATIVE
Nitrite: NEGATIVE
Protein, ur: 30 mg/dL — AB
Specific Gravity, Urine: 1.024 (ref 1.005–1.030)
pH: 5 (ref 5.0–8.0)

## 2023-09-16 LAB — VITAMIN B12: Vitamin B-12: 415 pg/mL (ref 180–914)

## 2023-09-16 LAB — LIPID PANEL
Cholesterol: 163 mg/dL (ref 0–200)
HDL: 39 mg/dL — ABNORMAL LOW (ref >40)
LDL Cholesterol: 96 mg/dL (ref 0–99)
Total CHOL/HDL Ratio: 4.2 {ratio}
Triglycerides: 139 mg/dL (ref <150)
VLDL: 28 mg/dL (ref 0–40)

## 2023-09-16 LAB — MAGNESIUM: Magnesium: 2.1 mg/dL (ref 1.7–2.4)

## 2023-09-16 LAB — MRSA NEXT GEN BY PCR, NASAL: MRSA by PCR Next Gen: NOT DETECTED

## 2023-09-16 LAB — APTT: aPTT: 36 s (ref 24–36)

## 2023-09-16 SURGERY — TEMPORARY PACEMAKER
Anesthesia: LOCAL

## 2023-09-16 MED ORDER — ORAL CARE MOUTH RINSE
15.0000 mL | OROMUCOSAL | Status: DC
  Administered 2023-09-16 – 2023-09-20 (×14): 15 mL via OROMUCOSAL

## 2023-09-16 MED ORDER — SODIUM CHLORIDE 0.9 % IV SOLN: INTRAVENOUS | Status: AC

## 2023-09-16 MED ORDER — CHLORHEXIDINE GLUCONATE CLOTH 2 % EX PADS
6.0000 | MEDICATED_PAD | Freq: Every day | CUTANEOUS | Status: DC
  Administered 2023-09-16 – 2023-09-20 (×5): 6 via TOPICAL

## 2023-09-16 MED ORDER — ATROPINE SULFATE 1 MG/10ML IJ SOSY
PREFILLED_SYRINGE | INTRAMUSCULAR | Status: AC
  Administered 2023-09-16: 0.5 mg via INTRAVENOUS
  Filled 2023-09-16: qty 10

## 2023-09-16 MED ORDER — GLUCAGON HCL RDNA (DIAGNOSTIC) 1 MG IJ SOLR: 5.0000 mg | Freq: Once | INTRAVENOUS | Status: DC

## 2023-09-16 MED ORDER — ATROPINE SULFATE 1 MG/10ML IJ SOSY
1.0000 mg | PREFILLED_SYRINGE | Freq: Once | INTRAMUSCULAR | Status: AC
  Administered 2023-09-16: 1 mg via INTRAVENOUS
  Filled 2023-09-16: qty 10

## 2023-09-16 MED ORDER — LIDOCAINE HCL (PF) 1 % IJ SOLN
INTRAMUSCULAR | Status: DC | PRN
  Administered 2023-09-16 (×2): 10 mL

## 2023-09-16 MED ORDER — ATROPINE SULFATE 1 MG/10ML IJ SOSY: 0.5000 mg | PREFILLED_SYRINGE | Freq: Once | INTRAMUSCULAR | Status: AC

## 2023-09-16 MED ORDER — ORAL CARE MOUTH RINSE: 15.0000 mL | OROMUCOSAL | Status: DC | PRN

## 2023-09-16 MED ORDER — HYDRALAZINE HCL 10 MG PO TABS
10.0000 mg | ORAL_TABLET | Freq: Two times a day (BID) | ORAL | Status: DC
  Administered 2023-09-16 (×2): 10 mg via ORAL
  Filled 2023-09-16 (×2): qty 1

## 2023-09-16 MED ORDER — HYDRALAZINE HCL 25 MG PO TABS
25.0000 mg | ORAL_TABLET | Freq: Three times a day (TID) | ORAL | Status: DC
  Administered 2023-09-17 – 2023-09-20 (×11): 25 mg via ORAL
  Filled 2023-09-16 (×11): qty 1

## 2023-09-16 MED ORDER — ATROPINE SULFATE 1 MG/10ML IJ SOSY
PREFILLED_SYRINGE | INTRAMUSCULAR | Status: AC
  Filled 2023-09-16: qty 10

## 2023-09-16 MED ORDER — ATROPINE SULFATE 1 MG/10ML IJ SOSY
0.5000 mg | PREFILLED_SYRINGE | Freq: Once | INTRAMUSCULAR | Status: AC
Start: 2023-09-16 — End: 2023-09-16
  Administered 2023-09-16: 0.5 mg via INTRAVENOUS

## 2023-09-16 MED ORDER — GLUCAGON HCL RDNA (DIAGNOSTIC) 1 MG IJ SOLR: 5.0000 mg/h | INTRAVENOUS | Status: DC

## 2023-09-16 MED ORDER — HEPARIN (PORCINE) IN NACL 1000-0.9 UT/500ML-% IV SOLN
INTRAVENOUS | Status: DC | PRN
  Administered 2023-09-16: 500 mL

## 2023-09-16 MED ORDER — LIDOCAINE HCL (PF) 1 % IJ SOLN
INTRAMUSCULAR | Status: AC
  Filled 2023-09-16: qty 30

## 2023-09-16 MED ORDER — CALCIUM GLUCONATE-NACL 1-0.675 GM/50ML-% IV SOLN
1.0000 g | Freq: Once | INTRAVENOUS | Status: AC
  Administered 2023-09-16: 1000 mg via INTRAVENOUS
  Filled 2023-09-16: qty 50

## 2023-09-16 SURGICAL SUPPLY — 10 items
CABLE ADAPT PACING TEMP 12FT (ADAPTER) IMPLANT
CATH S G BIP PACING (CATHETERS) IMPLANT
PINNACLE LONG 6F 25CM (SHEATH) ×1
SHEATH INTRO PINNACLE 6F 25CM (SHEATH) IMPLANT
SHEATH PINNACLE 6F 10CM (SHEATH) IMPLANT
SHEATH PROBE COVER 6X72 (BAG) IMPLANT
SHIELD CATH-GARD CONTAMINATION (MISCELLANEOUS) IMPLANT
WIRE EMERALD 3MM-J .035X150CM (WIRE) IMPLANT
WIRE MICRO SET SILHO 5FR 7 (SHEATH) IMPLANT
WIRE MICROINTRODUCER 60CM (WIRE) IMPLANT

## 2023-09-16 NOTE — ED Notes (Signed)
Pt HR maintains at 28-29 bpm. Dr.Kadakia made aware. This RN spoke to pharmacist as well. Pt remains alert and oriented. Not in any distress. Will continue to monitor.

## 2023-09-16 NOTE — ED Notes (Signed)
MD Charmayne Sheer alerted to patients lowered heart rate and concerning rhythm. Multiple EKG's sent to MD. Pharmacy and RN attempting to contact new hospital-ist provider over concerns of ordered medication and continuing concern over pt EKG status

## 2023-09-16 NOTE — ED Notes (Signed)
Pt HR observed dropping to 26-29 bpm. Dr.Kumar notified at this time who states he is going to contact cardiology team. Pt denies any chest pain, shortness of breath or any discomfort. Waiting for new orders. Cardiac monitoring in place. Will continue to monitor.

## 2023-09-16 NOTE — Consult Note (Addendum)
Ref: Mast, Man X, NP   Subjective:  Awake. No chest pain or dizziness. HR in 28 to 32/min. Post 0.5 mg. Atropine x 2 HR in 40's. 2nd degree AV block continues. BP 170-180/50-60 mm Hg.  Objective:  Vital Signs in the last 24 hours: Temp:  [97.6 F (36.4 C)-98.7 F (37.1 C)] 98 F (36.7 C) (01/24 1003) Pulse Rate:  [29-66] 40 (01/24 0945) Cardiac Rhythm: Sinus bradycardia (01/23 2157) Resp:  [9-25] 17 (01/24 0945) BP: (98-194)/(34-113) 185/56 (01/24 0945) SpO2:  [91 %-100 %] 100 % (01/24 0945) Weight:  [81.6 kg] 81.6 kg (01/23 1255)  Physical Exam: BP Readings from Last 1 Encounters:  09/16/23 (!) 185/56     Wt Readings from Last 1 Encounters:  09/15/23 81.6 kg    Weight change:  Body mass index is 34.01 kg/m. HEENT: [place]/AT, Eyes-Blue, Conjunctiva-Pink, Sclera-Non-icteric Neck: No JVD, No bruit, Trachea midline. Lungs:  Clear, Bilateral. Cardiac:  Bradycardic, Regular rhythm, normal S1 and S2, no S3. II/VI systolic murmur. Abdomen:  Soft, non-tender. BS present. Extremities:  No edema present. No cyanosis. No clubbing. CNS: AxOx3, Cranial nerves grossly intact, moves all 4 extremities.  Skin: Warm and dry.   Intake/Output from previous day: 01/23 0701 - 01/24 0700 In: 1050 [I.V.:1000; IV Piggyback:50] Out: -     Lab Results: BMET    Component Value Date/Time   NA 139 09/16/2023 0533   NA 137 09/15/2023 1330   NA 135 10/20/2021 0800   NA 137 10/06/2021 0000   K 3.8 09/16/2023 0533   K 4.6 09/15/2023 1330   K 4.9 10/20/2021 0800   CL 107 09/16/2023 0533   CL 106 09/15/2023 1330   CL 104 10/20/2021 0800   CO2 21 (L) 09/16/2023 0533   CO2 21 (L) 09/15/2023 1330   CO2 24 10/20/2021 0800   GLUCOSE 86 09/16/2023 0533   GLUCOSE 136 (H) 09/15/2023 1330   GLUCOSE 135 (H) 10/20/2021 0800   BUN 25 (H) 09/16/2023 0533   BUN 20 09/15/2023 1330   BUN 31 (H) 10/20/2021 0800   BUN 18 10/06/2021 0000   CREATININE 1.15 (H) 09/16/2023 0533   CREATININE 1.09 (H)  09/15/2023 1330   CREATININE 1.17 (H) 10/20/2021 0800   CALCIUM 8.9 09/16/2023 0533   CALCIUM 9.2 09/15/2023 1330   CALCIUM 9.1 10/20/2021 0800   GFRNONAA 46 (L) 09/16/2023 0533   GFRNONAA 49 (L) 09/15/2023 1330   GFRNONAA 45 (L) 10/20/2021 0800   GFRAA >60 03/11/2020 0819   GFRAA >60 03/09/2020 1750   GFRAA >60 06/10/2016 1023   CBC    Component Value Date/Time   WBC 6.9 09/16/2023 0533   RBC 4.36 09/16/2023 0533   HGB 12.7 09/16/2023 0533   HCT 39.1 09/16/2023 0533   PLT 135 (L) 09/16/2023 0533   MCV 89.7 09/16/2023 0533   MCH 29.1 09/16/2023 0533   MCHC 32.5 09/16/2023 0533   RDW 14.5 09/16/2023 0533   LYMPHSABS 0.9 09/15/2023 1315   MONOABS 0.2 09/15/2023 1315   EOSABS 0.0 09/15/2023 1315   BASOSABS 0.0 09/15/2023 1315   HEPATIC Function Panel Recent Labs    09/15/23 1330  PROT 8.0  ALBUMIN 3.6  AST 27  ALT 17  ALKPHOS 83   HEMOGLOBIN A1C No results found for: "MPG" CARDIAC ENZYMES Lab Results  Component Value Date   CKTOTAL 242 (H) 05/10/2021   TROPONINI <0.03 06/10/2016   TROPONINI <0.03 08/27/2015   BNP No results for input(s): "PROBNP" in the last 8760 hours.  TSH Recent Labs    09/15/23 1315  TSH 0.590   CHOLESTEROL No results for input(s): "CHOL" in the last 8760 hours.  Scheduled Meds:  enoxaparin (LOVENOX) injection  40 mg Subcutaneous Q24H   ketoconazole  1 Application Topical Daily   levothyroxine  125 mcg Oral Q0600   midodrine  10 mg Oral TID WC   multivitamin  1 tablet Oral Daily   potassium chloride  10 mEq Oral Daily   sodium chloride flush  3 mL Intravenous Q12H   Continuous Infusions: PRN Meds:.acetaminophen **OR** acetaminophen, polyethylene glycol, polyvinyl alcohol  Assessment/Plan: Sinus rhythm with 2nd degree AV block, type 1 Altered mental status  HTN Hypothyroidism Type 2 DM H/O DVT H/O Pulmonary embolism H/O atrial fibrillation  Plan: HR improves with atropine use. Consider temporary pacemaker followed by  permanent pacemaker if needed.   LOS: 1 day   Time spent including chart review, lab review, examination, discussion with patient/Nurse : 30 min   Orpah Cobb  MD  09/16/2023, 10:14 AM

## 2023-09-16 NOTE — Progress Notes (Signed)
   09/16/23 1320  Assess: MEWS Score  Temp 97.6 F (36.4 C)  BP (!) 178/43  MAP (mmHg) 81  Pulse Rate (!) 35  ECG Heart Rate (!) 34  Resp 18  Level of Consciousness Alert  SpO2 97 %  O2 Device Nasal Cannula  O2 Flow Rate (L/min) 2 L/min  Assess: MEWS Score  MEWS Temp 0  MEWS Systolic 0  MEWS Pulse 2  MEWS RR 0  MEWS LOC 0  MEWS Score 2  MEWS Score Color Yellow  Assess: if the MEWS score is Yellow or Red  Were vital signs accurate and taken at a resting state? Yes  Does the patient meet 2 or more of the SIRS criteria? No  MEWS guidelines implemented  Yes, yellow  Treat  MEWS Interventions Considered administering scheduled or prn medications/treatments as ordered  Take Vital Signs  Increase Vital Sign Frequency  Yellow: Q2hr x1, continue Q4hrs until patient remains green for 12hrs  Escalate  MEWS: Escalate Yellow: Discuss with charge nurse and consider notifying provider and/or RRT  Notify: Charge Nurse/RN  Name of Charge Nurse/RN Notified Elijio Miles  Provider Notification  Provider Name/Title Dr. Algie Coffer  Date Provider Notified 09/16/23  Time Provider Notified 1340  Method of Notification Face-to-face  Notification Reason Other (Comment);Change in status  Provider response At bedside  Date of Provider Response 09/16/23  Time of Provider Response 1340  Assess: SIRS CRITERIA  SIRS Temperature  0  SIRS Respirations  0  SIRS Pulse 0  SIRS WBC 0  SIRS Score Sum  0

## 2023-09-16 NOTE — Progress Notes (Addendum)
Received from Cath lab. Patient has a necklace on gold in color with diamonds and a ruby around her neck. Noted to have a timex gold watch in color on right wrist as well.

## 2023-09-16 NOTE — Progress Notes (Signed)
Echocardiogram 2D Echocardiogram has been performed.  Lucendia Herrlich 09/16/2023, 11:26 AM

## 2023-09-16 NOTE — Progress Notes (Signed)
[facility] Progress Note  Patient: Katrina [name]    YQI:[id number]  DOA: 09/15/2023     Date of Service: the patient was seen and examined on 09/16/2023  No chief complaint on file.  Brief hospital course: Katrina [name] is a 89 y.o. female with PMH of PAF on Eliquis, h/o DVT and PE, history of renal stones, Hypertension, as reviewed from EMR, patient is not aware why she is at [facility], possible due to underlying dementia. As per report patient had headache since last evening and patient was lethargic in the morning did not come to eat and staff reported that patient had vomiting at the facility so patient was brought in to the ED via EMS.  ED workup- CT head non actionable. Teleemtry monitoring shows - second Degree AV block. Patient takes metoprolol at home.  Cardiology was consulted.  [facility] consulted for admission and further management as below.  Assessment and Plan:  Second-degree AV block, type I, sinus rhythm Presented with altered mental status and headache Dig level 1.0, TSH and electrolytes within normal range Cardiology consulted, patient received multiple doses of atropine Discontinue digoxin and hold metoprolol for now Started hydralazine 25 mg p.o. 3 times daily with holding parameters May use ACE inhibitors and Imdur Monitor BP and titrate medications accordingly Follow cardiology for further recommendation 1/24 s/p temporary Central Venous Pacemaker insertion as per EP cardiology, maintain pacing rate at 80 to suppress ectopy Patient will be monitored for mental status changes before finalizing plan for emergent pacemaker. Patient was on Eliquis 2.5 mg p.o. twice daily at home which has been held, follow cardiology when to resume.  Hypothyroid, continue Synthroid  Body mass index is 37.28 kg/m.  Interventions:  Diet: Cardiac diet DVT Prophylaxis: Subcutaneous Lovenox   Advance goals of care discussion: Full code  Family Communication: family was not present  at bedside, at the time of interview.  The pt provided permission to discuss medical plan with the family. Opportunity was given to ask question and all questions were answered satisfactorily.   Disposition:  Pt is from SNF, admitted with 2nd degree AV block , still has CHB, which precludes a safe discharge. Discharge to SNF, when stable and cleared by cardiology.  Subjective: No significant events overnight, patient was resting comfortably in the ED, denied any chest pain or repressing.  Patient stated that she is not comfortable in this bed and was asking when she will get the room upstairs.  Denied any other complaints.  Physical Exam: General: NAD, lying comfortably Appear in no distress, affect appropriate Eyes: PERRLA ENT: Oral Mucosa Clear, moist  Neck: no JVD,  Cardiovascular: S1 and S2 Present, no Murmur, bradycardic Respiratory: good respiratory effort, Bilateral Air entry equal and Decreased, no Crackles, no wheezes Abdomen: Bowel Sound present, Soft and no tenderness,  Skin: no rashes Extremities: no Pedal edema, no calf tenderness Neurologic: without any new focal findings Gait not checked due to patient safety concerns  Vitals:   09/16/23 1730 09/16/23 1735 09/16/23 1740 09/16/23 1745  BP: (!) 84/67 (!) 143/69  (!) 125/113  Pulse: 60 80 80 83  Resp: 20 (!) 22 19 (!) 22  Temp:   98 F (36.7 C)   TempSrc:   Axillary   SpO2: 100% 96% 97% 94%  Weight:      Height:        Intake/Output Summary (Last 24 hours) at 09/16/2023 1825 Last data filed at 09/16/2023 1800 Gross per 24 hour  Intake 1050  ml  Output 360 ml  Net 690 ml   Filed Weights   09/15/23 1255 09/16/23 1320  Weight: 81.6 kg 89.5 kg    Data Reviewed: I have personally reviewed and interpreted daily labs, tele strips, imagings as discussed above. I reviewed all nursing notes, pharmacy notes, vitals, pertinent old records I have discussed plan of care as described above with RN and  patient/family.  CBC: Recent Labs  Lab 09/15/23 1315 09/16/23 0533  WBC 8.4 6.9  NEUTROABS 7.2  --   HGB 12.9 12.7  HCT 38.8 39.1  MCV 89.0 89.7  PLT 185 135*   Basic Metabolic Panel: Recent Labs  Lab 09/15/23 1330 09/16/23 0034 09/16/23 0533  NA 137  --  139  K 4.6  --  3.8  CL 106  --  107  CO2 21*  --  21*  GLUCOSE 136*  --  86  BUN 20  --  25*  CREATININE 1.09*  --  1.15*  CALCIUM 9.2  --  8.9  MG  --  2.1  --     Studies: CARDIAC CATHETERIZATION Result Date: 09/16/2023   Anticipated discharge date to be determined.   No indication for antiplatelet therapy at this time . EP consulted to consider permanent pacemaker if AV block do not recover.Marland Kitchen   ECHOCARDIOGRAM COMPLETE Result Date: 09/16/2023    ECHOCARDIOGRAM REPORT   Patient Name:   Katrina [name] Raz Date of Exam: 09/16/2023 Medical Rec #:  [id number]   Height:       61.0 in Accession #:    [id number]  Weight:       180.0 lb Date of Birth:  12-Oct-1931   BSA:          1.806 m Patient Age:    89 years    BP:           146/108 mmHg Patient Gender: F           HR:           31 bpm. Exam Location:  Inpatient Procedure: 2D Echo, Cardiac Doppler and Color Doppler Indications:     Atrial Fibrillation I48.91, Abnormal ECG R94.31  History:         Patient has no prior history of Echocardiogram examinations.                  Arrythmias:Atrial Fibrillation and Tachycardia; Risk                  Factors:Hypertension and Sleep Apnea.  Sonographer:     Lucendia Herrlich RCS Referring Phys:  [id number] [facility] GOEL Diagnosing Phys: Orpah Cobb MD IMPRESSIONS  1. Left ventricular ejection fraction, by estimation, is 55 to 60%. The left ventricle has normal function. The left ventricle has no regional wall motion abnormalities. There is mild concentric left ventricular hypertrophy. Left ventricular diastolic parameters are consistent with Grade I diastolic dysfunction (impaired relaxation).  2. Right ventricular systolic function is normal. The right  ventricular size is normal.  3. Left atrial size was mildly dilated.  4. The mitral valve is degenerative. Mild mitral valve regurgitation. Moderate mitral annular calcification.  5. The aortic valve is tricuspid. There is moderate calcification of the aortic valve. There is mild thickening of the aortic valve. Aortic valve regurgitation is not visualized. Mild aortic valve stenosis.  6. There is mild (Grade II) atheroma plaque involving the aortic root and ascending aorta.  7. The inferior vena cava is normal in size with <  50% respiratory variability, suggesting right atrial pressure of 8 mmHg. FINDINGS  Left Ventricle: Left ventricular ejection fraction, by estimation, is 55 to 60%. The left ventricle has normal function. The left ventricle has no regional wall motion abnormalities. The left ventricular internal cavity size was normal in size. There is  mild concentric left ventricular hypertrophy. Left ventricular diastolic parameters are consistent with Grade I diastolic dysfunction (impaired relaxation). Right Ventricle: The right ventricular size is normal. No increase in right ventricular wall thickness. Right ventricular systolic function is normal. Left Atrium: Left atrial size was mildly dilated. Right Atrium: Right atrial size was normal in size. Pericardium: There is no evidence of pericardial effusion. Mitral Valve: The mitral valve is degenerative in appearance. Moderate mitral annular calcification. Mild mitral valve regurgitation. Tricuspid Valve: The tricuspid valve is normal in structure. Tricuspid valve regurgitation is mild. Aortic Valve: The aortic valve is tricuspid. There is moderate calcification of the aortic valve. There is mild thickening of the aortic valve. There is mild aortic valve annular calcification. Aortic valve regurgitation is not visualized. Mild aortic stenosis is present. Aortic valve mean gradient measures 9.0 mmHg. Aortic valve peak gradient measures 19.3 mmHg. Aortic valve  area, by VTI measures 1.54 cm. Pulmonic Valve: The pulmonic valve was normal in structure. Pulmonic valve regurgitation is not visualized. Aorta: The aortic root is normal in size and structure. There is mild (Grade II) atheroma plaque involving the aortic root and ascending aorta. Venous: The inferior vena cava is normal in size with less than 50% respiratory variability, suggesting right atrial pressure of 8 mmHg. IAS/Shunts: The atrial septum is grossly normal.  LEFT VENTRICLE PLAX 2D LVIDd:         4.70 cm   Diastology LVIDs:         3.40 cm   LV e' medial:    4.87 cm/s LV PW:         1.20 cm   LV E/e' medial:  25.5 LV IVS:        0.50 cm   LV e' lateral:   5.92 cm/s LVOT diam:     1.90 cm   LV E/e' lateral: 20.9 LV SV:         94 LV SV Index:   52 LVOT Area:     2.84 cm  RIGHT VENTRICLE             IVC RV S prime:     10.50 cm/s  IVC diam: 1.80 cm TAPSE (M-mode): 2.3 cm LEFT ATRIUM             Index        RIGHT ATRIUM           Index LA diam:        4.30 cm 2.38 cm/m   RA Area:     15.00 cm LA Vol (A2C):   51.0 ml 28.24 ml/m  RA Volume:   38.20 ml  21.15 ml/m LA Vol (A4C):   36.9 ml 20.43 ml/m LA Biplane Vol: 43.4 ml 24.03 ml/m  AORTIC VALVE AV Area (Vmax):    1.49 cm AV Area (Vmean):   1.59 cm AV Area (VTI):     1.54 cm AV Vmax:           219.50 cm/s AV Vmean:          137.500 cm/s AV VTI:            0.610 m AV Peak Grad:  19.3 mmHg AV Mean Grad:      9.0 mmHg LVOT Vmax:         115.00 cm/s LVOT Vmean:        77.050 cm/s LVOT VTI:          0.331 m LVOT/AV VTI ratio: 0.54  AORTA Ao Root diam: 2.80 cm Ao Asc diam:  3.40 cm MITRAL VALVE                TRICUSPID VALVE MV Area (PHT): 2.36 cm     TV Peak grad:   16.8 mmHg MV Decel Time: 322 msec     TV Vmax:        2.05 m/s MV E velocity: 124.00 cm/s  TR Peak grad:   19.2 mmHg MV A velocity: 136.00 cm/s  TR Vmax:        219.00 cm/s MV E/A ratio:  0.91                             SHUNTS                             Systemic VTI:  0.33 m                              Systemic Diam: 1.90 cm Orpah Cobb MD Electronically signed by Orpah Cobb MD Signature Date/Time: 09/16/2023/1:19:22 PM    Final     Scheduled Meds:  atropine       enoxaparin (LOVENOX) injection  40 mg Subcutaneous Q24H   hydrALAZINE  10 mg Oral BID   ketoconazole  1 Application Topical Daily   levothyroxine  125 mcg Oral Q0600   multivitamin  1 tablet Oral Daily   potassium chloride  10 mEq Oral Daily   sodium chloride flush  3 mL Intravenous Q12H   Continuous Infusions: PRN Meds: acetaminophen **OR** acetaminophen, atropine, polyethylene glycol, polyvinyl alcohol  Time spent: 35 minutes  Author: Gillis Santa. MD [facility] 09/16/2023 6:25 PM  To reach On-call, see care teams to locate the attending and reach out to them via [email or web address]. If 7PM-7AM, please contact night-coverage If you still have difficulty reaching the attending provider, please page the [facility] (Director on Call) for [facility] on amion for assistance.

## 2023-09-16 NOTE — Progress Notes (Signed)
[facility] night cross cover note:  I discussed patient's case and treatment options briefly with our inpatient pharmacist, Ivery Quale, [facility], regarding this patient who was admitted earlier in the evening with bradycardia. Per these discussions will trial a dose of 1 g of Calcium gluconate iv and hold off on glucagon drip for now.  Cardiology has been formally consulted and continues to follow.    Newton Pigg, DO Hospitalist

## 2023-09-16 NOTE — Progress Notes (Signed)
Dr. Lucianne Muss notified that patient continuously trying to get OOB even after being redirected multiple times. States she knows she is in the hospital and she knows that something is in her groin, however she can get up and she will get up if she wants to. Order received to put patient in restraints if we need to to protect patient. Floor mats placed on floor. Safety sitter monitor at bedside as well.

## 2023-09-16 NOTE — Progress Notes (Signed)
Pt heart drop to 19-21 bpm. Dr. Algie Coffer notified and is at bedside.

## 2023-09-16 NOTE — Consult Note (Signed)
Cardiology Consultation   Patient ID: Katrina [name] MRN: [id number]; DOB: 1932/08/17  Admit date: 09/15/2023 Date of Consult: 09/16/2023  PCP:  Mast, Man X, NP   [facility] Providers Cardiologist:  None    Patient Profile:   Katrina [name] is a 89 y.o. female with a hx of DVT/PE,  AFib, HTNwho is being seen 09/16/2023 for the evaluation of advanced heart block at the request of Dr. Algie Coffer.  History of Present Illness:   Katrina [name] was brought from her facility 09/15/23 with AMS, initial ER note describes her as somnolent but arousable answers questions appropriately  Discussed prior hx of metabolic encephalopathy, no noted/documented dementia, EMS called by her ALF for nausea, vomiting, headache, and altered mental status >> when she didn't arrive for breakfast found dried vomit all over herself   Cardiology consulted  Noted to be on metoprolol and dig (level 1.0) both held with plans to monitor Today HRs began dipping into 20's require/get atropine and with no improvement > temp wire   In the ER c/o HA, no ongoing N/V, + chills CT head negative for acute findings Labs largely unremarkable Lactic acid 1.4 TSH 0.590  Pt is now s/p temp wire seen in the holding area She is awake but not interactive Dr. Algie Coffer also bedside Unknown what her baseline is   Past Medical History:  Diagnosis Date   Atrial fibrillation (HCC)    DVT (deep venous thrombosis) (HCC)    Elevated lipids    Gallstone    Hypertension    Hypothyroidism    Nephrolithiasis    Overactive bladder    Pulmonary emboli (HCC)    while on HRT   SCC (squamous cell carcinoma)    of left leg   Sleep apnea    on C-pap   Vertigo     Past Surgical History:  Procedure Laterality Date   CHOLECYSTECTOMY     Dr. Freida Busman   CYSTOSCOPY W/ URETERAL STENT PLACEMENT Right 09/26/2021   Procedure: CYSTOSCOPY WITH RETROGRADE PYELOGRAM/URETERAL STENT PLACEMENT;  Surgeon: Jerilee Field, MD;  Location: [facility];   Service: Urology;  Laterality: Right;   CYSTOSCOPY WITH STENT PLACEMENT  9/12   [email or web address]   CYSTOSCOPY/URETEROSCOPY/HOLMIUM LASER/STENT PLACEMENT Right 10/20/2021   Procedure: CYSTOSCOPY RIGHT URETEROSCOPY/HOLMIUM LASER/STENT EXCHANGE;  Surgeon: Bjorn Pippin, MD;  Location: [facility];  Service: Urology;  Laterality: Right;   HYSTEROSCOPY WITH D & C  7/09   PMP with endometrial polyp   REPLACEMENT TOTAL KNEE Bilateral 1998, 2001       TONSILLECTOMY AND ADENOIDECTOMY       Home Medications:  Prior to Admission medications   Medication Sig Start Date End Date Taking? Authorizing Provider  acetaminophen (TYLENOL) 325 MG tablet Take 650 mg by mouth every 6 (six) hours as needed for mild pain.   Yes [provider]  apixaban (ELIQUIS) 2.5 MG TABS tablet Take 1 tablet by mouth 2 (two) times daily.   Yes [provider]  ketoconazole (NIZORAL) 2 % cream Apply 1 application topically daily. 09/18/21  Yes [provider]  levothyroxine (SYNTHROID) 125 MCG tablet Take 125 mcg by mouth daily.   Yes [provider]  metoprolol succinate (TOPROL-XL) 50 MG 24 hr tablet Take 50 mg by mouth daily. 09/05/21  Yes [provider]  Multiple Vitamins-Minerals (PRESERVISION AREDS 2 PO) Take 1 tablet by mouth daily.   Yes [provider]  polyvinyl alcohol (LIQUIFILM TEARS) 1.4 % ophthalmic solution Place 1 drop  into both eyes 4 (four) times daily as needed for dry eyes.   Yes [provider]  potassium chloride (MICRO-K) 10 MEQ CR capsule Take 10 mEq by mouth daily. 09/24/21  Yes [provider]  Prenatal 28-0.8 MG TABS Take 1 tablet by mouth daily.   Yes [provider]    Inpatient Medications: Scheduled Meds:  atropine       [MAR Hold] enoxaparin (LOVENOX) injection  40 mg Subcutaneous Q24H   [MAR Hold] hydrALAZINE  10 mg Oral BID   [MAR Hold] ketoconazole  1 Application Topical Daily   [MAR Hold] levothyroxine  125 mcg Oral  Q0600   [MAR Hold] multivitamin  1 tablet Oral Daily   [MAR Hold] potassium chloride  10 mEq Oral Daily   [MAR Hold] sodium chloride flush  3 mL Intravenous Q12H   Continuous Infusions:  PRN Meds: [MAR Hold] acetaminophen **OR** [MAR Hold] acetaminophen, atropine, Heparin (Porcine) in NaCl, lidocaine (PF), [MAR Hold] polyethylene glycol, [MAR Hold] polyvinyl alcohol  Allergies:    Allergies  Allergen Reactions   Codeine     Other reaction(s): nausea   Penicillins     Mother and brother have allergy, patient just does not take this Did PCN reaction causing immediate rash, facial/tongue/throat swelling, SOB or lightheadedness with hypotension: unknown Did PCN reaction causing severe rash involving mucus membranes or skin necrosis:unknown Did PCN reaction that required hospitalization : unknown Did PCN reaction occurring within the last 10 years: unknown Pt never actually took med. Did okay with ampicillin   Cephalosporins Rash    Dermatitis to keflex in 2012- Has tolerated several courses since that time-  Most recently 2021. Notably patient has stasis dermatitis that developed around the same time.     Social History:   Social History   Socioeconomic History   Marital status: Single    Spouse name: Not on file   Number of children: Not on file   Years of education: Not on file   Highest education level: Not on file  Occupational History   Not on file  Tobacco Use   Smoking status: Never   Smokeless tobacco: Never  Vaping Use   Vaping status: Never Used  Substance and Sexual Activity   Alcohol use: No    Alcohol/week: 0.0 standard drinks of alcohol   Drug use: No   Sexual activity: Not Currently    Partners: Male    Birth control/protection: Post-menopausal  Other Topics Concern   Not on file  Social History Narrative   Not on file   Social Drivers of Health   Financial Resource Strain: Not on file  Food Insecurity: Patient Unable To Answer (09/16/2023)   Hunger  Vital Sign    Worried About Running Out of Food in the Last Year: Patient unable to answer    Ran Out of Food in the Last Year: Patient unable to answer  Transportation Needs: Patient Unable To Answer (09/16/2023)   PRAPARE - Transportation    Lack of Transportation (Medical): Patient unable to answer    Lack of Transportation (Non-Medical): Patient unable to answer  Physical Activity: Not on file  Stress: Not on file  Social Connections: Patient Unable To Answer (09/16/2023)   Social Connection and Isolation Panel [NHANES]    Frequency of Communication with Friends and Family: Patient unable to answer    Frequency of Social Gatherings with Friends and Family: Patient unable to answer    Attends Religious Services: Patient unable to answer  Active Member of Clubs or Organizations: Patient unable to answer    Attends Club or Organization Meetings: Patient unable to answer    Marital Status: Patient unable to answer  Intimate Partner Violence: Patient Unable To Answer (09/16/2023)   Humiliation, Afraid, Rape, and Kick questionnaire    Fear of Current or Ex-Partner: Patient unable to answer    Emotionally Abused: Patient unable to answer    Physically Abused: Patient unable to answer    Sexually Abused: Patient unable to answer    Family History:   Family History  Problem Relation Age of Onset   Hypertension Father    Heart disease Father    Hypertension Mother    Hyperlipidemia Mother    Heart attack Mother    Prostate cancer Brother      ROS:  Please see the history of present illness.  All other ROS reviewed and negative.     Physical Exam/Data:   Vitals:   09/16/23 1553 09/16/23 1558 09/16/23 1603 09/16/23 1608  BP: (!) 193/60 (!) 176/46 (!) 169/52 (!) 167/46  Pulse: (!) 0 (!) 38 (!) 32 (!) 31  Resp: 19   20  Temp:      TempSrc:      SpO2: 97% 98% 99% 98%  Weight:      Height:        Intake/Output Summary (Last 24 hours) at 09/16/2023 1710 Last data filed at  09/16/2023 1328 Gross per 24 hour  Intake 1050 ml  Output 150 ml  Net 900 ml      09/16/2023    1:20 PM 09/15/2023   12:55 PM 02/03/2023    8:43 AM  Last 3 Weights  Weight (lbs) 197 lb 5 oz 180 lb 195 lb 3.2 oz  Weight (kg) 89.5 kg 81.647 kg 88.542 kg     Body mass index is 37.28 kg/m.  General:  Well nourished, does not interact with [name] HEENT: normal Neck: no JVD Vascular: No carotid bruits Cardiac:  RRR; no murmurs, gallops or rubs Lungs:  CTA b/l, no wheezing, rhonchi or rales  Abd: soft, nontender Ext: no edema Musculoskeletal:  No deformities Skin: warm and dry  Neuro:  unable to assess, pt awake but not interactive Psych:  unable to assess  EKG:  The EKG was personally reviewed and demonstrates:    4 EKGs: 2:1 AVblock, RBBB  40bpm 34bpm,  31bpm 32bpm  Old: 2021: AFib 124bpm, RBBB   Telemetry:  Telemetry was personally reviewed and demonstrates:   V paced > post temp wire  Relevant CV Studies:  09/16/23: TTE  1. Left ventricular ejection fraction, by estimation, is 55 to 60%. The  left ventricle has normal function. The left ventricle has no regional  wall motion abnormalities. There is mild concentric left ventricular  hypertrophy. Left ventricular diastolic  parameters are consistent with Grade I diastolic dysfunction (impaired  relaxation).   2. Right ventricular systolic function is normal. The right ventricular  size is normal.   3. Left atrial size was mildly dilated.   4. The mitral valve is degenerative. Mild mitral valve regurgitation.  Moderate mitral annular calcification.   5. The aortic valve is tricuspid. There is moderate calcification of the  aortic valve. There is mild thickening of the aortic valve. Aortic valve  regurgitation is not visualized. Mild aortic valve stenosis.   6. There is mild (Grade II) atheroma plaque involving the aortic root and  ascending aorta.   7. The inferior vena cava is  normal in size with <50% respiratory   variability, suggesting right atrial pressure of 8 mmHg.    Laboratory Data:  High Sensitivity Troponin:  No results for input(s): "TROPONINIHS" in the last 720 hours.   Chemistry Recent Labs  Lab 09/15/23 1330 09/16/23 0034 09/16/23 0533  NA 137  --  139  K 4.6  --  3.8  CL 106  --  107  CO2 21*  --  21*  GLUCOSE 136*  --  86  BUN 20  --  25*  CREATININE 1.09*  --  1.15*  CALCIUM 9.2  --  8.9  MG  --  2.1  --   GFRNONAA 49*  --  46*  ANIONGAP 10  --  11    Recent Labs  Lab 09/15/23 1330  PROT 8.0  ALBUMIN 3.6  AST 27  ALT 17  ALKPHOS 83  BILITOT 0.8   Lipids  Recent Labs  Lab 09/16/23 0930  CHOL 163  TRIG 139  HDL 39*  LDLCALC 96  CHOLHDL 4.2    Hematology Recent Labs  Lab 09/15/23 1315 09/16/23 0533  WBC 8.4 6.9  RBC 4.36 4.36  HGB 12.9 12.7  HCT 38.8 39.1  MCV 89.0 89.7  MCH 29.6 29.1  MCHC 33.2 32.5  RDW 14.3 14.5  PLT 185 135*   Thyroid  Recent Labs  Lab 09/15/23 1315  TSH 0.590    BNPNo results for input(s): "BNP", "PROBNP" in the last 168 hours.  DDimer No results for input(s): "DDIMER" in the last 168 hours.   Radiology/Studies:   CT HEAD WO CONTRAST ( ) Result Date: 09/15/2023 CLINICAL DATA:  Altered mental status. EXAM: CT HEAD WITHOUT CONTRAST TECHNIQUE: Contiguous axial images were obtained from the base of the skull through the vertex without intravenous contrast. RADIATION DOSE REDUCTION: This exam was performed according to the departmental dose-optimization program which includes automated exposure control, adjustment of the mA and/or kV according to patient size and/or use of iterative reconstruction technique. COMPARISON:  CT head dated May 10, 2021. FINDINGS: Brain: No evidence of acute infarction, hemorrhage, hydrocephalus, extra-axial collection or mass lesion/mass effect. Stable atrophy and moderate chronic microvascular ischemic changes. Vascular: Atherosclerotic vascular calcification of the carotid siphons. No  hyperdense vessel. Skull: Normal. Negative for fracture or focal lesion. Sinuses/Orbits: Complete opacification the right maxillary sinus. Other: None. IMPRESSION: 1. No acute intracranial abnormality. 2. Stable atrophy and moderate chronic microvascular ischemic changes. 3. Right maxillary sinusitis. Electronically Signed   By: Obie Dredge M.D.   On: 09/15/2023 15:51   DG Chest Portable 1 View Result Date: 09/15/2023 CLINICAL DATA:  Altered mental status.  Chills. EXAM: PORTABLE CHEST 1 VIEW COMPARISON:  09/26/2021. FINDINGS: Low lung volume. Apparent increased interstitial markings are likely secondary to low lung volume. No frank pulmonary edema. Bilateral lung fields are otherwise clear. There is apparent blunting of bilateral lateral costophrenic angles, which may be due to overlying soft tissue versus subtle pleural effusion/pleural thickening. Consider lateral radiograph versus ultrasound, if differentiation is desired. Stable cardio-mediastinal silhouette. No acute osseous abnormalities. The soft tissues are within normal limits. IMPRESSION: *Low lung volume. Apparent blunting of bilateral lateral costophrenic angles, as discussed above. Electronically Signed   By: Jules Schick M.D.   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 with RBBB (old) Now with temp wire  Out pt meds included: On Toproil 50mg  daily  dig (by notes, not seen on her list)  had  dig level therapeutic at 1.0  Unknown what her baseline mental status is, suspect this is a change by ER notes Will defer w/u and management to the ICU team Has hx of a metabolic encephalopathy by notes in the past Hopefully will improve with HR  Threshold on her temp wire is 0/6 She does have some intrinsic beats and left at 80bpm and 5mA     Risk Assessment/Risk Scores:    For questions or updates, please contact [facility] Please consult [email or web address] for contact info under     Signed, Sheilah Pigeon, PA-C  09/16/2023 5:10 PM

## 2023-09-16 NOTE — Consult Note (Signed)
HR remains in 20-30's. Patient and POA agrees to temporary pacemaker followed by permanent pacemaker if needed.  Orpah Cobb, MD 09/16/2023 3:36 PM

## 2023-09-16 NOTE — ED Notes (Signed)
Dr.Kadakia at bedside. Total of 1mg  Atropine IVP given to pt. Pt remains asymptomatic. GCS 15. Pt HR increased to 42 bpm. Per MD, notify cardiology team if HR drops below 30 again. Will continue to monitor. Cardiac monitoring in place.

## 2023-09-16 NOTE — ED Notes (Signed)
Pt bp is now 197/52. Pt just got the 2nd round of Atropine IVP. Dr.Kumar made aware. Pt remains asymptomatic. Currently watching tv. Not in any distress. Will continue to monitor.

## 2023-09-16 NOTE — Plan of Care (Signed)
Problem: Elimination: Goal: Will not experience complications related to urinary retention Outcome: Progressing   Problem: Pain Managment: Goal: General experience of comfort will improve and/or be controlled Outcome: Progressing   Problem: Safety: Goal: Ability to remain free from injury will improve Outcome: Progressing

## 2023-09-16 NOTE — Consult Note (Signed)
Transvenous pacemaker placed successfully. EP consulted. Dr. Sharyn Lull covering over weekend.  Orpah Cobb, 09/16/2023, 6:04 PM

## 2023-09-16 NOTE — ED Notes (Signed)
Pt is currently eating breakfast. Tolerating food and drinks at this time. Denies chest pain, dizziness or shortness of breath. Cardiology team is aware of pt HR ranging from 30-40 bpm. Cardiac monitoring in place. Will continue to monitor.

## 2023-09-17 DIAGNOSIS — R001 Bradycardia, unspecified: Secondary | ICD-10-CM | POA: Diagnosis not present

## 2023-09-17 LAB — BASIC METABOLIC PANEL
Anion gap: 10 (ref 5–15)
BUN: 22 mg/dL (ref 8–23)
CO2: 20 mmol/L — ABNORMAL LOW (ref 22–32)
Calcium: 8.7 mg/dL — ABNORMAL LOW (ref 8.9–10.3)
Chloride: 110 mmol/L (ref 98–111)
Creatinine, Ser: 1 mg/dL (ref 0.44–1.00)
GFR, Estimated: 54 mL/min — ABNORMAL LOW (ref >60)
Glucose, Bld: 84 mg/dL (ref 70–99)
Potassium: 3.9 mmol/L (ref 3.5–5.1)
Sodium: 140 mmol/L (ref 135–145)

## 2023-09-17 LAB — CBC
HCT: 37.2 % (ref 36.0–46.0)
Hemoglobin: 12.1 g/dL (ref 12.0–15.0)
MCH: 28.6 pg (ref 26.0–34.0)
MCHC: 32.5 g/dL (ref 30.0–36.0)
MCV: 87.9 fL (ref 80.0–100.0)
Platelets: 134 10*3/uL — ABNORMAL LOW (ref 150–400)
RBC: 4.23 MIL/uL (ref 3.87–5.11)
RDW: 14.1 % (ref 11.5–15.5)
WBC: 7 10*3/uL (ref 4.0–10.5)
nRBC: 0 % (ref 0.0–0.2)

## 2023-09-17 LAB — MAGNESIUM: Magnesium: 2 mg/dL (ref 1.7–2.4)

## 2023-09-17 LAB — PHOSPHORUS: Phosphorus: 2.8 mg/dL (ref 2.5–4.6)

## 2023-09-17 NOTE — Plan of Care (Signed)
Problem: Clinical Measurements: Goal: Ability to maintain clinical measurements within normal limits will improve Outcome: Progressing Goal: Will remain free from infection Outcome: Progressing Goal: Diagnostic test results will improve Outcome: Progressing Goal: Respiratory complications will improve Outcome: Progressing Goal: Cardiovascular complication will be avoided Outcome: Progressing   Problem: Nutrition: Goal: Adequate nutrition will be maintained Outcome: Progressing   Problem: Safety: Goal: Ability to remain free from injury will improve Outcome: Progressing

## 2023-09-17 NOTE — Progress Notes (Signed)
Subjective:  Patient awake denies any complaints.  V-paced rhythm on the monitor.  Appreciate EP consult and help off AV blocking medications  Objective:  Vital Signs in the last 24 hours: Temp:  [97.6 F (36.4 C)-98.8 F (37.1 C)] 98.8 F (37.1 C) (01/25 0745) Pulse Rate:  [0-83] 80 (01/25 0800) Resp:  [11-27] 20 (01/25 0800) BP: (84-197)/(42-126) 120/62 (01/25 0800) SpO2:  [78 %-100 %] 97 % (01/25 0800) Weight:  [89.5 kg] 89.5 kg (01/24 1320)  Intake/Output from previous day: 01/24 0701 - 01/25 0700 In: -  Out: 1110 [Urine:1110] Intake/Output from this shift: No intake/output data recorded.  Physical Exam: Neck: no adenopathy, no carotid bruit, no JVD, and supple, symmetrical, trachea midline Lungs: clear to auscultation bilaterally Heart: regular rate and rhythm, S1, S2 normal, and 2/6 systolic murmur noted Abdomen: soft, non-tender; bowel sounds normal; no masses,  no organomegaly Extremities: extremities normal, atraumatic, no cyanosis or edema and right groin pacemaker site dry  Lab Results: Recent Labs    09/16/23 0533 09/17/23 0251  WBC 6.9 7.0  HGB 12.7 12.1  PLT 135* 134*   Recent Labs    09/16/23 0533 09/17/23 0251  NA 139 140  K 3.8 3.9  CL 107 110  CO2 21* 20*  GLUCOSE 86 84  BUN 25* 22  CREATININE 1.15* 1.00   No results for input(s): "TROPONINI" in the last 72 hours.  Invalid input(s): "CK", "MB" Hepatic Function Panel Recent Labs    09/15/23 1330  PROT 8.0  ALBUMIN 3.6  AST 27  ALT 17  ALKPHOS 83  BILITOT 0.8   Recent Labs    09/16/23 0930  CHOL 163   No results for input(s): "PROTIME" in the last 72 hours.  Imaging: Imaging results have been reviewed and CARDIAC CATHETERIZATION Result Date: 09/16/2023   Anticipated discharge date to be determined.   No indication for antiplatelet therapy at this time . EP consulted to consider permanent pacemaker if AV block do not recover.Marland Kitchen   ECHOCARDIOGRAM COMPLETE Result Date: 09/16/2023     ECHOCARDIOGRAM REPORT   Patient Name:   Katrina [name] Date of Exam: 09/16/2023 Medical Rec #:  [id number]   Height:       61.0 in Accession #:    [id number]  Weight:       180.0 lb Date of Birth:  1931-12-27   BSA:          1.806 m Patient Age:    89 years    BP:           146/108 mmHg Patient Gender: F           HR:           31 bpm. Exam Location:  Inpatient Procedure: 2D Echo, Cardiac Doppler and Color Doppler Indications:     Atrial Fibrillation I48.91, Abnormal ECG R94.31  History:         Patient has no prior history of Echocardiogram examinations.                  Arrythmias:Atrial Fibrillation and Tachycardia; Risk                  Factors:Hypertension and Sleep Apnea.  Sonographer:     Lucendia Herrlich RCS Referring Phys:  [id number] [facility] GOEL Diagnosing Phys: Orpah Cobb MD IMPRESSIONS  1. Left ventricular ejection fraction, by estimation, is 55 to 60%. The left ventricle has normal function. The left ventricle has no regional wall motion abnormalities.  There is mild concentric left ventricular hypertrophy. Left ventricular diastolic parameters are consistent with Grade I diastolic dysfunction (impaired relaxation).  2. Right ventricular systolic function is normal. The right ventricular size is normal.  3. Left atrial size was mildly dilated.  4. The mitral valve is degenerative. Mild mitral valve regurgitation. Moderate mitral annular calcification.  5. The aortic valve is tricuspid. There is moderate calcification of the aortic valve. There is mild thickening of the aortic valve. Aortic valve regurgitation is not visualized. Mild aortic valve stenosis.  6. There is mild (Grade II) atheroma plaque involving the aortic root and ascending aorta.  7. The inferior vena cava is normal in size with <50% respiratory variability, suggesting right atrial pressure of 8 mmHg. FINDINGS  Left Ventricle: Left ventricular ejection fraction, by estimation, is 55 to 60%. The left ventricle has normal function. The left  ventricle has no regional wall motion abnormalities. The left ventricular internal cavity size was normal in size. There is  mild concentric left ventricular hypertrophy. Left ventricular diastolic parameters are consistent with Grade I diastolic dysfunction (impaired relaxation). Right Ventricle: The right ventricular size is normal. No increase in right ventricular wall thickness. Right ventricular systolic function is normal. Left Atrium: Left atrial size was mildly dilated. Right Atrium: Right atrial size was normal in size. Pericardium: There is no evidence of pericardial effusion. Mitral Valve: The mitral valve is degenerative in appearance. Moderate mitral annular calcification. Mild mitral valve regurgitation. Tricuspid Valve: The tricuspid valve is normal in structure. Tricuspid valve regurgitation is mild. Aortic Valve: The aortic valve is tricuspid. There is moderate calcification of the aortic valve. There is mild thickening of the aortic valve. There is mild aortic valve annular calcification. Aortic valve regurgitation is not visualized. Mild aortic stenosis is present. Aortic valve mean gradient measures 9.0 mmHg. Aortic valve peak gradient measures 19.3 mmHg. Aortic valve area, by VTI measures 1.54 cm. Pulmonic Valve: The pulmonic valve was normal in structure. Pulmonic valve regurgitation is not visualized. Aorta: The aortic root is normal in size and structure. There is mild (Grade II) atheroma plaque involving the aortic root and ascending aorta. Venous: The inferior vena cava is normal in size with less than 50% respiratory variability, suggesting right atrial pressure of 8 mmHg. IAS/Shunts: The atrial septum is grossly normal.  LEFT VENTRICLE PLAX 2D LVIDd:         4.70 cm   Diastology LVIDs:         3.40 cm   LV e' medial:    4.87 cm/s LV PW:         1.20 cm   LV E/e' medial:  25.5 LV IVS:        0.50 cm   LV e' lateral:   5.92 cm/s LVOT diam:     1.90 cm   LV E/e' lateral: 20.9 LV SV:          94 LV SV Index:   52 LVOT Area:     2.84 cm  RIGHT VENTRICLE             IVC RV S prime:     10.50 cm/s  IVC diam: 1.80 cm TAPSE (M-mode): 2.3 cm LEFT ATRIUM             Index        RIGHT ATRIUM           Index LA diam:        4.30 cm 2.38 cm/m   RA  Area:     15.00 cm LA Vol (A2C):   51.0 ml 28.24 ml/m  RA Volume:   38.20 ml  21.15 ml/m LA Vol (A4C):   36.9 ml 20.43 ml/m LA Biplane Vol: 43.4 ml 24.03 ml/m  AORTIC VALVE AV Area (Vmax):    1.49 cm AV Area (Vmean):   1.59 cm AV Area (VTI):     1.54 cm AV Vmax:           219.50 cm/s AV Vmean:          137.500 cm/s AV VTI:            0.610 m AV Peak Grad:      19.3 mmHg AV Mean Grad:      9.0 mmHg LVOT Vmax:         115.00 cm/s LVOT Vmean:        77.050 cm/s LVOT VTI:          0.331 m LVOT/AV VTI ratio: 0.54  AORTA Ao Root diam: 2.80 cm Ao Asc diam:  3.40 cm MITRAL VALVE                TRICUSPID VALVE MV Area (PHT): 2.36 cm     TV Peak grad:   16.8 mmHg MV Decel Time: 322 msec     TV Vmax:        2.05 m/s MV E velocity: 124.00 cm/s  TR Peak grad:   19.2 mmHg MV A velocity: 136.00 cm/s  TR Vmax:        219.00 cm/s MV E/A ratio:  0.91                             SHUNTS                             Systemic VTI:  0.33 m                             Systemic Diam: 1.90 cm Orpah Cobb MD Electronically signed by Orpah Cobb MD Signature Date/Time: 09/16/2023/1:19:22 PM    Final    CT HEAD WO CONTRAST ( ) Result Date: 09/15/2023 CLINICAL DATA:  Altered mental status. EXAM: CT HEAD WITHOUT CONTRAST TECHNIQUE: Contiguous axial images were obtained from the base of the skull through the vertex without intravenous contrast. RADIATION DOSE REDUCTION: This exam was performed according to the departmental dose-optimization program which includes automated exposure control, adjustment of the mA and/or kV according to patient size and/or use of iterative reconstruction technique. COMPARISON:  CT head dated May 10, 2021. FINDINGS: Brain: No evidence of acute  infarction, hemorrhage, hydrocephalus, extra-axial collection or mass lesion/mass effect. Stable atrophy and moderate chronic microvascular ischemic changes. Vascular: Atherosclerotic vascular calcification of the carotid siphons. No hyperdense vessel. Skull: Normal. Negative for fracture or focal lesion. Sinuses/Orbits: Complete opacification the right maxillary sinus. Other: None. IMPRESSION: 1. No acute intracranial abnormality. 2. Stable atrophy and moderate chronic microvascular ischemic changes. 3. Right maxillary sinusitis. Electronically Signed   By: Obie Dredge M.D.   On: 09/15/2023 15:51   DG Chest Portable 1 View Result Date: 09/15/2023 CLINICAL DATA:  Altered mental status.  Chills. EXAM: PORTABLE CHEST 1 VIEW COMPARISON:  09/26/2021. FINDINGS: Low lung volume. Apparent increased interstitial markings are likely secondary to low lung volume. No frank pulmonary edema. Bilateral lung fields  are otherwise clear. There is apparent blunting of bilateral lateral costophrenic angles, which may be due to overlying soft tissue versus subtle pleural effusion/pleural thickening. Consider lateral radiograph versus ultrasound, if differentiation is desired. Stable cardio-mediastinal silhouette. No acute osseous abnormalities. The soft tissues are within normal limits. IMPRESSION: *Low lung volume. Apparent blunting of bilateral lateral costophrenic angles, as discussed above. Electronically Signed   By: Jules Schick M.D.   On: 09/15/2023 14:20    Cardiac Studies:  Assessment/Plan:  Sinus rhythm with 2nd degree high-grade AV block, probably infra hisal block Altered mental status  HTN Hypothyroidism Type 2 DM H/O DVT H/O Pulmonary embolism H/O atrial fibrillation Plan Continue present management EKG in a.m.  LOS: 2 days    Rinaldo Cloud 09/17/2023, 11:36 AM

## 2023-09-17 NOTE — Progress Notes (Signed)
Electrophysiology Progress Note  Patient Name: Katrina [name] Date of Encounter: 09/17/2023  Primary Cardiologist: None   Subjective   No complaints currently. She thinks in a garage.  Inpatient Medications    Scheduled Meds:  Chlorhexidine Gluconate Cloth  6 each Topical Daily   enoxaparin (LOVENOX) injection  40 mg Subcutaneous Q24H   hydrALAZINE  25 mg Oral Q8H   ketoconazole  1 Application Topical Daily   levothyroxine  125 mcg Oral Q0600   multivitamin  1 tablet Oral Daily   mouth rinse  15 mL Mouth Rinse 4 times per day   potassium chloride  10 mEq Oral Daily   sodium chloride flush  3 mL Intravenous Q12H   Continuous Infusions:  sodium chloride 10 mL/hr at 09/16/23 2138   PRN Meds: acetaminophen **OR** acetaminophen, mouth rinse, polyethylene glycol, polyvinyl alcohol   Vital Signs    Vitals:   09/17/23 0600 09/17/23 0700 09/17/23 0745 09/17/23 0800  BP: 124/63 133/65  120/62  Pulse:    80  Resp: 16 (!) 25  20  Temp:   98.8 F (37.1 C)   TempSrc:   Oral   SpO2:    97%  Weight:      Height:        Intake/Output Summary (Last 24 hours) at 09/17/2023 1116 Last data filed at 09/17/2023 0600 Gross per 24 hour  Intake --  Output 1110 ml  Net -1110 ml   Filed Weights   09/15/23 1255 09/16/23 1320  Weight: 81.6 kg 89.5 kg    Telemetry    Paced rhythm - Personally Reviewed  ECG    2:1 AV block - Personally Reviewed  Physical Exam   GEN: No acute distress.   Neck: No JVD Cardiac: RRR, no murmurs, rubs, or gallops.  Respiratory: Clear to auscultation bilaterally. GI: Soft, nontender, non-distended  MS: No edema; No deformity. Neuro:  Nonfocal  Psych: Normal affect   Labs    Chemistry Recent Labs  Lab 09/15/23 1330 09/16/23 0533 09/17/23 0251  NA 137 139 140  K 4.6 3.8 3.9  CL 106 107 110  CO2 21* 21* 20*  GLUCOSE 136* 86 84  BUN 20 25* 22  CREATININE 1.09* 1.15* 1.00  CALCIUM 9.2 8.9 8.7*  PROT 8.0  --   --   ALBUMIN 3.6  --   --    AST 27  --   --   ALT 17  --   --   ALKPHOS 83  --   --   BILITOT 0.8  --   --   GFRNONAA 49* 46* 54*  ANIONGAP 10 11 10      Hematology Recent Labs  Lab 09/15/23 1315 09/16/23 0533 09/17/23 0251  WBC 8.4 6.9 7.0  RBC 4.36 4.36 4.23  HGB 12.9 12.7 12.1  HCT 38.8 39.1 37.2  MCV 89.0 89.7 87.9  MCH 29.6 29.1 28.6  MCHC 33.2 32.5 32.5  RDW 14.3 14.5 14.1  PLT 185 135* 134*    Cardiac EnzymesNo results for input(s): "TROPONINI" in the last 168 hours. No results for input(s): "TROPIPOC" in the last 168 hours.   BNPNo results for input(s): "BNP", "PROBNP" in the last 168 hours.   DDimer No results for input(s): "DDIMER" in the last 168 hours.   Summary of Pertinent studies    TTE: 09/16/2023 EF 55-60%,Gr I diastolic dysfunction. Degenerative MV with mild MR.   Patient Profile     89 y.o. female with a hx of  DVT/PE,  AFib, HTN admitted after being found down with complete heart block.  Assessment & Plan    Complete heart block With pre-existing conduction disease On metoprolol XL 50 mg daily and digoxin (level 1.0 on admit) Temporary pacing wire placed by femoral access. Underlying is heart block with a ventricular rhythm of about 35 BPM Hold beta-blockers and digoxin Eliquis held due to possible pacemaker placement  Encephalopathy Present on admission --significantly improved She remains confused, but per family (sister-in-law) she is at baseline  History of paroxysmal atrial fibrillation See ECGs Jul 2021  Hypothyroidism TSH 0.59 this admission  History of DVT/PE On eliquis as outpatient. SCDs ordered   For questions or updates, please contact [facility] Please consult [email or web address] for contact info under Cardiology/STEMI.      Signed, Maurice Small, MD 09/17/2023, 11:16 AM

## 2023-09-17 NOTE — Plan of Care (Signed)

## 2023-09-17 NOTE — Progress Notes (Signed)
[facility] Progress Note  Patient: Katrina [name]    ZOX:[id number]  DOA: 09/15/2023     Date of Service: the patient was seen and examined on 09/17/2023  No chief complaint on file.  Brief hospital course: Patient is an 89 year old female, past medical history significant for PAF on Eliquis, h/o DVT and PE, history of renal stones, and hypertension.  There are concerns for likely underlying dementia.  Patient is currently being managed for secondary degree AV block, type I.  Electrophysiology team is directing care.  Input is highly appreciated.  ED workup- CT head non actionable. Teleemtry monitoring shows - second Degree AV block. Patient takes metoprolol at home.  09/17/2023: Patient seen.  Also discussed with patient's nurse.  No new changes.  Input from the electrophysiology team is highly appreciated.  Assessment and Plan:  Second-degree AV block, type I, sinus rhythm -Patient presented with altered mental status and headache -Dig level 1.0, TSH and electrolytes within normal range -Cardiology consulted, patient received multiple doses of atropine -Digoxin and metoprolol have been discontinued.   -Electrophysiology team is directing care.   -1/24 s/p temporary Central Venous Pacemaker insertion as per EP cardiology, maintain pacing rate at 80 to suppress ectopy -Likely permanent pacemaker placement.  Hypothyroid: -Continue Synthroid  Body mass index is 37.28 kg/m.  Interventions:  DVT Prophylaxis: Subcutaneous Lovenox   Advance goals of care discussion: Full code  Family Communication:   Disposition:  Pt is from SNF, admitted with 2nd degree AV block , still has CHB, which precludes a safe discharge. Discharge to SNF, when stable and cleared by cardiology.  Subjective:  -No new complaints. No chest pain or shortness of breath.    Physical Exam: General: Not in any distress.  Patient is awake and alert.   HEENT: No pallor or jaundice.  Neck: no JVD,   Cardiovascular: S1 and S2  Respiratory: Clear to auscultation.   Abdomen: Obese, soft and nontender.   Extremities: no Pedal edema  Vitals:   09/17/23 1100 09/17/23 1200 09/17/23 1230 09/17/23 1300  BP:  118/83    Pulse: 79 81  79  Resp: (!) 22 (!) 25  (!) 24  Temp:   98.7 F (37.1 C)   TempSrc:   Oral   SpO2: 100% 96%  96%  Weight:      Height:        Intake/Output Summary (Last 24 hours) at 09/17/2023 1502 Last data filed at 09/17/2023 1300 Gross per 24 hour  Intake 200 ml  Output 960 ml  Net -760 ml   Filed Weights   09/15/23 1255 09/16/23 1320  Weight: 81.6 kg 89.5 kg    Data Reviewed: I have personally reviewed and interpreted daily labs, tele strips, imagings as discussed above. I reviewed all nursing notes, pharmacy notes, vitals, pertinent old records I have discussed plan of care as described above with RN and patient/family.  CBC: Recent Labs  Lab 09/15/23 1315 09/16/23 0533 09/17/23 0251  WBC 8.4 6.9 7.0  NEUTROABS 7.2  --   --   HGB 12.9 12.7 12.1  HCT 38.8 39.1 37.2  MCV 89.0 89.7 87.9  PLT 185 135* 134*   Basic Metabolic Panel: Recent Labs  Lab 09/15/23 1330 09/16/23 0034 09/16/23 0533 09/17/23 0251  NA 137  --  139 140  K 4.6  --  3.8 3.9  CL 106  --  107 110  CO2 21*  --  21* 20*  GLUCOSE 136*  --  86  84  BUN 20  --  25* 22  CREATININE 1.09*  --  1.15* 1.00  CALCIUM 9.2  --  8.9 8.7*  MG  --  2.1  --  2.0  PHOS  --   --   --  2.8    Studies: CARDIAC CATHETERIZATION Result Date: 09/16/2023   Anticipated discharge date to be determined.   No indication for antiplatelet therapy at this time . EP consulted to consider permanent pacemaker if AV block do not recover..    Scheduled Meds:  Chlorhexidine Gluconate Cloth  6 each Topical Daily   enoxaparin (LOVENOX) injection  40 mg Subcutaneous Q24H   hydrALAZINE  25 mg Oral Q8H   ketoconazole  1 Application Topical Daily   levothyroxine  125 mcg Oral Q0600   multivitamin  1 tablet  Oral Daily   mouth rinse  15 mL Mouth Rinse 4 times per day   potassium chloride  10 mEq Oral Daily   sodium chloride flush  3 mL Intravenous Q12H   Continuous Infusions:  sodium chloride 10 mL/hr at 09/16/23 2138   PRN Meds: acetaminophen **OR** acetaminophen, mouth rinse, polyethylene glycol, polyvinyl alcohol  Time spent:    Author:  Barnetta Chapel, MD  [facility] 09/17/2023 3:02 PM  To reach On-call, see care teams to locate the attending and reach out to them via [email or web address]. If 7PM-7AM, please contact night-coverage If you still have difficulty reaching the attending provider, please page the [facility] (Director on Call) for [facility] on amion for assistance.

## 2023-09-17 NOTE — Progress Notes (Signed)
Late entry: PAFib diagnosis discussed at time of our consult, not placed in the consult note. Out patient she is on Eliquis for known hx of Afib (noted to be under-dosed (for weight/creat) No Eliquis for now given need for potential procedures Pt arrived and maintained SR at the time of consult Felt high risk for heparin given significant AMS, groin sheath/temp wire, advanced age. SCDs were ordered ordered for her  Francis Dowse, [place]

## 2023-09-18 DIAGNOSIS — R001 Bradycardia, unspecified: Secondary | ICD-10-CM | POA: Diagnosis not present

## 2023-09-18 LAB — BASIC METABOLIC PANEL
Anion gap: 12 (ref 5–15)
BUN: 18 mg/dL (ref 8–23)
CO2: 17 mmol/L — ABNORMAL LOW (ref 22–32)
Calcium: 8.5 mg/dL — ABNORMAL LOW (ref 8.9–10.3)
Chloride: 113 mmol/L — ABNORMAL HIGH (ref 98–111)
Creatinine, Ser: 1.04 mg/dL — ABNORMAL HIGH (ref 0.44–1.00)
GFR, Estimated: 51 mL/min — ABNORMAL LOW (ref >60)
Glucose, Bld: 117 mg/dL — ABNORMAL HIGH (ref 70–99)
Potassium: 4 mmol/L (ref 3.5–5.1)
Sodium: 142 mmol/L (ref 135–145)

## 2023-09-18 LAB — CBC
HCT: 38.8 % (ref 36.0–46.0)
Hemoglobin: 12.9 g/dL (ref 12.0–15.0)
MCH: 29 pg (ref 26.0–34.0)
MCHC: 33.2 g/dL (ref 30.0–36.0)
MCV: 87.2 fL (ref 80.0–100.0)
Platelets: 144 10*3/uL — ABNORMAL LOW (ref 150–400)
RBC: 4.45 MIL/uL (ref 3.87–5.11)
RDW: 14.1 % (ref 11.5–15.5)
WBC: 6.5 10*3/uL (ref 4.0–10.5)
nRBC: 0 % (ref 0.0–0.2)

## 2023-09-18 NOTE — Progress Notes (Signed)
[facility] Progress Note  Patient: Katrina [name]    ZOX:[id number]  DOA: 09/15/2023     Date of Service: the patient was seen and examined on 09/18/2023  No chief complaint on file.  Brief hospital course: Patient is an 89 year old female, past medical history significant for PAF on Eliquis, h/o DVT and PE, history of renal stones, and hypertension.  There are concerns for likely underlying dementia.  Electrophysiology team is directing care for complete heart block.  Patient is currently on temporary pacer.  For possible permanent pacemaker placement tomorrow.  Electrophysiology team is directing care.  Input is highly appreciated.  09/18/2023: Patient seen.  Also discussed with patient's nurse.  No new changes.  Input from the electrophysiology team is highly appreciated.  For possible permanent pacemaker placement tomorrow.  Assessment and Plan:  Complete heart block:  -Patient presented with altered mental status and headache -Dig level 1.0, TSH and electrolytes within normal range -Cardiology consulted, patient received multiple doses of atropine -Digoxin and metoprolol have been discontinued.   -Electrophysiology team is directing care.   -1/24 s/p temporary Central Venous Pacemaker insertion as per EP cardiology, maintain pacing rate at 80 to suppress ectopy -Likely permanent pacemaker placement tomorrow, 09/19/2023.  Hypothyroidism: -Continue Synthroid  Encephalopathy/possible dementing illness: Patient said to be at baseline.  Body mass index is 37.28 kg/m.  Interventions:  DVT Prophylaxis: Subcutaneous Lovenox   Advance goals of care discussion: Full code  Family Communication:   Disposition:  Pt is from SNF, admitted with 2nd degree AV block , still has CHB, which precludes a safe discharge. Discharge to SNF, when stable and cleared by cardiology.  Subjective:  -No new complaints. No chest pain or shortness of breath.    Physical Exam: General: Not in any  distress.  Patient is awake and alert.   HEENT: No pallor or jaundice.  Neck: no JVD,  Cardiovascular: S1 and S2  Respiratory: Clear to auscultation.   Abdomen: Obese, soft and nontender.   Extremities: no Pedal edema  Vitals:   09/18/23 1400 09/18/23 1500 09/18/23 1600 09/18/23 1610  BP: (!) 144/73 (!) 139/58  132/64  Pulse: 79 80 80 79  Resp: (!) 22 (!) 26 14 15   Temp:      TempSrc:      SpO2: 96% 95% 97% 97%  Weight:      Height:        Intake/Output Summary (Last 24 hours) at 09/18/2023 1704 Last data filed at 09/18/2023 1600 Gross per 24 hour  Intake 290.01 ml  Output 800 ml  Net -509.99 ml   Filed Weights   09/15/23 1255 09/16/23 1320  Weight: 81.6 kg 89.5 kg    Data Reviewed: I have personally reviewed and interpreted daily labs, tele strips, imagings as discussed above. I reviewed all nursing notes, pharmacy notes, vitals, pertinent old records I have discussed plan of care as described above with RN and patient/family.  CBC: Recent Labs  Lab 09/15/23 1315 09/16/23 0533 09/17/23 0251 09/18/23 0341  WBC 8.4 6.9 7.0 6.5  NEUTROABS 7.2  --   --   --   HGB 12.9 12.7 12.1 12.9  HCT 38.8 39.1 37.2 38.8  MCV 89.0 89.7 87.9 87.2  PLT 185 135* 134* 144*   Basic Metabolic Panel: Recent Labs  Lab 09/15/23 1330 09/16/23 0034 09/16/23 0533 09/17/23 0251 09/18/23 0341  NA 137  --  139 140 142  K 4.6  --  3.8 3.9 4.0  CL 106  --  107 110 113*  CO2 21*  --  21* 20* 17*  GLUCOSE 136*  --  86 84 117*  BUN 20  --  25* 22 18  CREATININE 1.09*  --  1.15* 1.00 1.04*  CALCIUM 9.2  --  8.9 8.7* 8.5*  MG  --  2.1  --  2.0  --   PHOS  --   --   --  2.8  --     Studies: No results found.   Scheduled Meds:  Chlorhexidine Gluconate Cloth  6 each Topical Daily   enoxaparin (LOVENOX) injection  40 mg Subcutaneous Q24H   hydrALAZINE  25 mg Oral Q8H   ketoconazole  1 Application Topical Daily   levothyroxine  125 mcg Oral Q0600   multivitamin  1 tablet Oral Daily    mouth rinse  15 mL Mouth Rinse 4 times per day   potassium chloride  10 mEq Oral Daily   sodium chloride flush  3 mL Intravenous Q12H   Continuous Infusions:   PRN Meds: acetaminophen **OR** acetaminophen, mouth rinse, polyethylene glycol, polyvinyl alcohol  Time spent:    Author:  Barnetta Chapel, MD  [facility] 09/18/2023 5:04 PM  To reach On-call, see care teams to locate the attending and reach out to them via [email or web address]. If 7PM-7AM, please contact night-coverage If you still have difficulty reaching the attending provider, please page the [facility] (Director on Call) for [facility] on amion for assistance.

## 2023-09-18 NOTE — Plan of Care (Signed)

## 2023-09-18 NOTE — Plan of Care (Signed)

## 2023-09-18 NOTE — H&P (View-Only) (Signed)
  Patient Name: GABRIEL PAULDING Date of Encounter: 09/19/2023  Primary Cardiologist: None Electrophysiologist: None  Interval Summary   Pt reports she feels well today.  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  Denies pain, shortness of breath.   Vital Signs    Vitals:   09/19/23 0200 09/19/23 0400 09/19/23 0610 09/19/23 0700  BP: 132/78 (!) 142/80 (!) 161/92 (!) 141/78  Pulse: 80  80 80  Resp: (!) 22  20 (!) 21  Temp:      TempSrc:      SpO2: 96%  95% 94%  Weight:      Height:        Intake/Output Summary (Last 24 hours) at 09/19/2023 0841 Last data filed at 09/19/2023 0630 Gross per 24 hour  Intake 120 ml  Output 700 ml  Net -580 ml   Filed Weights   09/15/23 1255 09/16/23 1320  Weight: 81.6 kg 89.5 kg    Physical Exam    GEN- elderly female lying in bed in NAD, alert and oriented to self, place.   Lungs- Clear to ausculation bilaterally, normal work of breathing Cardiac- Regular rate and rhythm (paced), no murmurs, rubs or gallops GI- soft, NT, ND, + BS Extremities- no clubbing or cyanosis. No edema  Telemetry    VP 80 (VVI 80 bpm), underlying CHB with narrow escape in 30's (personally reviewed)  Hospital Course    Chandrika D Swearengin is a 89 y.o. female with PMH of paroxysmal AF, DVT/PE on eliquis admitted for 09/15/23 after being found down at home. Found to have CHB. On beta blocker pre-admit & therapeutic on digoxin. Femoral TVP placed. Pending PPM.   Assessment & Plan    CHB  Prior conduction disease > RBBB in 2021 -was on Toprol 50 mg daily -digoxin level 1 on admit  -TVP wire site wnl, underlying rhythm CHB with narrow escape in 30's -hold beta blockers, digoxin -eliquis on hold for possible PPM  -NPO for procedure  -last dose DVT lovenox 1/26, hold for procedure. SCD's.   Hx Paroxysmal AF -seen on EKG 02/2020   Thrombocytopenia  -admit platelets 185, then 130's, 1/27 are 109   Unstageable Pressure Ulcers  -per TRH  -increases risk  of infection for implant   Metabolic Encephalopathy +/- Underlying Dementia  -present on admit, per primary   Hx DVT / PE  -eliquis as outpatient, on hold for PPM  -SCD's   Hypothyroidism  -TSH 0.59 on admit     Attempted to call family member listed in chart, Sadie Orellana, to review procedure. No answer, message left for return call.    For questions or updates, please contact [facility] Please consult [email or web address] for contact info under Cardiology/STEMI.  Signed, Canary Brim, NP-C, AGACNP-BC [facility] - Electrophysiology  09/19/2023, 8:41 AM  EP Attending  Patient seen. I discussed the PPM insertion with her and she wishes to proceed. She has persistent CHB. We will undergo PPM insertion via the left SCV.   Sharlot Gowda Carla Rashad,MD

## 2023-09-18 NOTE — Progress Notes (Addendum)
Electrophysiology Progress Note  Patient Name: Katrina [name] Date of Encounter: 09/18/2023  Primary Cardiologist: None   Subjective   No complaints currently though she is tired and reports that she did not sleep well the previous night.  Inpatient Medications    Scheduled Meds:  Chlorhexidine Gluconate Cloth  6 each Topical Daily   enoxaparin (LOVENOX) injection  40 mg Subcutaneous Q24H   hydrALAZINE  25 mg Oral Q8H   ketoconazole  1 Application Topical Daily   levothyroxine  125 mcg Oral Q0600   multivitamin  1 tablet Oral Daily   mouth rinse  15 mL Mouth Rinse 4 times per day   potassium chloride  10 mEq Oral Daily   sodium chloride flush  3 mL Intravenous Q12H   Continuous Infusions:   PRN Meds: acetaminophen **OR** acetaminophen, mouth rinse, polyethylene glycol, polyvinyl alcohol   Vital Signs    Vitals:   09/18/23 0338 09/18/23 0400 09/18/23 0500 09/18/23 0600  BP:  (!) 160/131 127/85 (!) 136/96  Pulse:  80 80 80  Resp:      Temp: 97.9 F (36.6 C)     TempSrc: Axillary     SpO2:  95% 96% 98%  Weight:      Height:        Intake/Output Summary (Last 24 hours) at 09/18/2023 0729 Last data filed at 09/18/2023 0600 Gross per 24 hour  Intake 523.7 ml  Output 300 ml  Net 223.7 ml   Filed Weights   09/15/23 1255 09/16/23 1320  Weight: 81.6 kg 89.5 kg    Telemetry    Paced rhythm - Personally Reviewed  ECG    No new  Physical Exam   GEN: No acute distress.   Neck: No JVD Cardiac: RRR, no murmurs, rubs, or gallops.  Respiratory: Clear to auscultation bilaterally. GI: Soft, nontender, non-distended  MS: No edema; No deformity. Neuro:  Nonfocal  Psych: Normal affect   Labs    Chemistry Recent Labs  Lab 09/15/23 1330 09/16/23 0533 09/17/23 0251 09/18/23 0341  NA 137 139 140 142  K 4.6 3.8 3.9 4.0  CL 106 107 110 113*  CO2 21* 21* 20* 17*  GLUCOSE 136* 86 84 117*  BUN 20 25* 22 18  CREATININE 1.09* 1.15* 1.00 1.04*  CALCIUM 9.2 8.9  8.7* 8.5*  PROT 8.0  --   --   --   ALBUMIN 3.6  --   --   --   AST 27  --   --   --   ALT 17  --   --   --   ALKPHOS 83  --   --   --   BILITOT 0.8  --   --   --   GFRNONAA 49* 46* 54* 51*  ANIONGAP 10 11 10 12      Hematology Recent Labs  Lab 09/16/23 0533 09/17/23 0251 09/18/23 0341  WBC 6.9 7.0 6.5  RBC 4.36 4.23 4.45  HGB 12.7 12.1 12.9  HCT 39.1 37.2 38.8  MCV 89.7 87.9 87.2  MCH 29.1 28.6 29.0  MCHC 32.5 32.5 33.2  RDW 14.5 14.1 14.1  PLT 135* 134* 144*    Cardiac EnzymesNo results for input(s): "TROPONINI" in the last 168 hours. No results for input(s): "TROPIPOC" in the last 168 hours.   BNPNo results for input(s): "BNP", "PROBNP" in the last 168 hours.   DDimer No results for input(s): "DDIMER" in the last 168 hours.   Summary of Pertinent studies  TTE: 09/16/2023 EF 55-60%,Gr I diastolic dysfunction. Degenerative MV with mild MR.   Patient Profile     89 y.o. female with a hx of DVT/PE,  AFib, HTN admitted after being found down with complete heart block.  Assessment & Plan    Complete heart block With pre-existing conduction disease On metoprolol XL 50 mg daily and digoxin (level 1.0 on admit) Temporary pacing wire placed by femoral access. Underlying rhythm is heart block with a ventricular rhythm of about 35 BPM Hold beta-blockers and digoxin Eliquis held due to possible pacemaker placement  Encephalopathy Present on admission --significantly improved She remains confused, but per family (sister-in-law) she is at baseline  History of paroxysmal atrial fibrillation See ECGs Jul 2021 I suspect she has had flutter with 2:1 conduction as well (03/09/2020)  Hypothyroidism TSH 0.59 this admission  History of DVT/PE On eliquis as outpatient. SCDs ordered   For questions or updates, please contact [facility] Please consult [email or web address] for contact info under Cardiology/STEMI.      Signed, Maurice Small, MD 09/18/2023, 7:29 AM

## 2023-09-18 NOTE — Consult Note (Signed)
WOC Nurse Consult Note: Reason for Consult: buttock DTPI From [facility] HA, Nausea and Vomiting Wound type:  Bilateral buttock Deep Tissue Pressure Injuries  Pressure Injury POA: Yes Measurement:see nursing flow sheets Wound bed: intact skin, dark purple, non blanchable  Drainage (amount, consistency, odor) none Periwound: intact  Dressing procedure/placement/frequency: Monitor CLOSELY for evolution of DTPI Silicone foam dressings to the bilateral buttocks/sacrum, change every 3 days. ASSESS UNDER dressings each shift for any acute changes in the wounds.   Low air loss mattress in place for moisture management and pressure redistribution  Re consult if needed, will not follow at this time. Thanks  Katria Botts [employer], RN,CWOCN, CNS, CWON-AP [phone])

## 2023-09-18 NOTE — Progress Notes (Signed)
Subjective:  Patient denies any complaints.  Eating breakfast earlier.  Scheduled for possible permanent pacemaker tomorrow.  Remains paced rhythm on the monitor  Objective:  Vital Signs in the last 24 hours: Temp:  [97.8 F (36.6 C)-98.7 F (37.1 C)] 97.9 F (36.6 C) (01/26 0338) Pulse Rate:  [79-81] 80 (01/26 0800) Resp:  [16-30] 22 (01/26 0800) BP: (110-177)/(43-131) 111/73 (01/26 0800) SpO2:  [94 %-100 %] 97 % (01/26 0800)  Intake/Output from previous day: 01/25 0701 - 01/26 0700 In: 523.7 [P.O.:300; I.V.:223.7] Out: 300 [Urine:300] Intake/Output from this shift: No intake/output data recorded.  Physical Exam: Exam unchanged  Lab Results: Recent Labs    09/17/23 0251 09/18/23 0341  WBC 7.0 6.5  HGB 12.1 12.9  PLT 134* 144*   Recent Labs    09/17/23 0251 09/18/23 0341  NA 140 142  K 3.9 4.0  CL 110 113*  CO2 20* 17*  GLUCOSE 84 117*  BUN 22 18  CREATININE 1.00 1.04*   No results for input(s): "TROPONINI" in the last 72 hours.  Invalid input(s): "CK", "MB" Hepatic Function Panel Recent Labs    09/15/23 1330  PROT 8.0  ALBUMIN 3.6  AST 27  ALT 17  ALKPHOS 83  BILITOT 0.8   Recent Labs    09/16/23 0930  CHOL 163   No results for input(s): "PROTIME" in the last 72 hours.  Imaging: Imaging results have been reviewed and CARDIAC CATHETERIZATION Result Date: 09/16/2023   Anticipated discharge date to be determined.   No indication for antiplatelet therapy at this time . EP consulted to consider permanent pacemaker if AV block do not recover.Marland Kitchen   ECHOCARDIOGRAM COMPLETE Result Date: 09/16/2023    ECHOCARDIOGRAM REPORT   Patient Name:   Katrina [name] Hone Date of Exam: 09/16/2023 Medical Rec #:  [id number]   Height:       61.0 in Accession #:    [id number]  Weight:       180.0 lb Date of Birth:  01/29/1932   BSA:          1.806 m Patient Age:    89 years    BP:           146/108 mmHg Patient Gender: F           HR:           31 bpm. Exam Location:  Inpatient  Procedure: 2D Echo, Cardiac Doppler and Color Doppler Indications:     Atrial Fibrillation I48.91, Abnormal ECG R94.31  History:         Patient has no prior history of Echocardiogram examinations.                  Arrythmias:Atrial Fibrillation and Tachycardia; Risk                  Factors:Hypertension and Sleep Apnea.  Sonographer:     Lucendia Herrlich RCS Referring Phys:  [id number] [facility] GOEL Diagnosing Phys: Orpah Cobb MD IMPRESSIONS  1. Left ventricular ejection fraction, by estimation, is 55 to 60%. The left ventricle has normal function. The left ventricle has no regional wall motion abnormalities. There is mild concentric left ventricular hypertrophy. Left ventricular diastolic parameters are consistent with Grade I diastolic dysfunction (impaired relaxation).  2. Right ventricular systolic function is normal. The right ventricular size is normal.  3. Left atrial size was mildly dilated.  4. The mitral valve is degenerative. Mild mitral valve regurgitation. Moderate mitral annular calcification.  5. The  aortic valve is tricuspid. There is moderate calcification of the aortic valve. There is mild thickening of the aortic valve. Aortic valve regurgitation is not visualized. Mild aortic valve stenosis.  6. There is mild (Grade II) atheroma plaque involving the aortic root and ascending aorta.  7. The inferior vena cava is normal in size with <50% respiratory variability, suggesting right atrial pressure of 8 mmHg. FINDINGS  Left Ventricle: Left ventricular ejection fraction, by estimation, is 55 to 60%. The left ventricle has normal function. The left ventricle has no regional wall motion abnormalities. The left ventricular internal cavity size was normal in size. There is  mild concentric left ventricular hypertrophy. Left ventricular diastolic parameters are consistent with Grade I diastolic dysfunction (impaired relaxation). Right Ventricle: The right ventricular size is normal. No increase in right  ventricular wall thickness. Right ventricular systolic function is normal. Left Atrium: Left atrial size was mildly dilated. Right Atrium: Right atrial size was normal in size. Pericardium: There is no evidence of pericardial effusion. Mitral Valve: The mitral valve is degenerative in appearance. Moderate mitral annular calcification. Mild mitral valve regurgitation. Tricuspid Valve: The tricuspid valve is normal in structure. Tricuspid valve regurgitation is mild. Aortic Valve: The aortic valve is tricuspid. There is moderate calcification of the aortic valve. There is mild thickening of the aortic valve. There is mild aortic valve annular calcification. Aortic valve regurgitation is not visualized. Mild aortic stenosis is present. Aortic valve mean gradient measures 9.0 mmHg. Aortic valve peak gradient measures 19.3 mmHg. Aortic valve area, by VTI measures 1.54 cm. Pulmonic Valve: The pulmonic valve was normal in structure. Pulmonic valve regurgitation is not visualized. Aorta: The aortic root is normal in size and structure. There is mild (Grade II) atheroma plaque involving the aortic root and ascending aorta. Venous: The inferior vena cava is normal in size with less than 50% respiratory variability, suggesting right atrial pressure of 8 mmHg. IAS/Shunts: The atrial septum is grossly normal.  LEFT VENTRICLE PLAX 2D LVIDd:         4.70 cm   Diastology LVIDs:         3.40 cm   LV e' medial:    4.87 cm/s LV PW:         1.20 cm   LV E/e' medial:  25.5 LV IVS:        0.50 cm   LV e' lateral:   5.92 cm/s LVOT diam:     1.90 cm   LV E/e' lateral: 20.9 LV SV:         94 LV SV Index:   52 LVOT Area:     2.84 cm  RIGHT VENTRICLE             IVC RV S prime:     10.50 cm/s  IVC diam: 1.80 cm TAPSE (M-mode): 2.3 cm LEFT ATRIUM             Index        RIGHT ATRIUM           Index LA diam:        4.30 cm 2.38 cm/m   RA Area:     15.00 cm LA Vol (A2C):   51.0 ml 28.24 ml/m  RA Volume:   38.20 ml  21.15 ml/m LA Vol  (A4C):   36.9 ml 20.43 ml/m LA Biplane Vol: 43.4 ml 24.03 ml/m  AORTIC VALVE AV Area (Vmax):    1.49 cm AV Area (Vmean):   1.59 cm  AV Area (VTI):     1.54 cm AV Vmax:           219.50 cm/s AV Vmean:          137.500 cm/s AV VTI:            0.610 m AV Peak Grad:      19.3 mmHg AV Mean Grad:      9.0 mmHg LVOT Vmax:         115.00 cm/s LVOT Vmean:        77.050 cm/s LVOT VTI:          0.331 m LVOT/AV VTI ratio: 0.54  AORTA Ao Root diam: 2.80 cm Ao Asc diam:  3.40 cm MITRAL VALVE                TRICUSPID VALVE MV Area (PHT): 2.36 cm     TV Peak grad:   16.8 mmHg MV Decel Time: 322 msec     TV Vmax:        2.05 m/s MV E velocity: 124.00 cm/s  TR Peak grad:   19.2 mmHg MV A velocity: 136.00 cm/s  TR Vmax:        219.00 cm/s MV E/A ratio:  0.91                             SHUNTS                             Systemic VTI:  0.33 m                             Systemic Diam: 1.90 cm Orpah Cobb MD Electronically signed by Orpah Cobb MD Signature Date/Time: 09/16/2023/1:19:22 PM    Final     Cardiac Studies:  Assessment/Plan:  Sinus rhythm with 2nd degree high-grade AV block, probably infra hisal block Altered mental status  HTN Hypothyroidism Type 2 DM H/O DVT H/O Pulmonary embolism H/O atrial fibrillation Plan Continue present management Scheduled for permanent pacemaker in a.m.  LOS: 3 days    Rinaldo Cloud 09/18/2023, 11:18 AM

## 2023-09-18 NOTE — Progress Notes (Incomplete)
  Patient Name: Katrina [name] Date of Encounter: 09/19/2023  Primary Cardiologist: None Electrophysiologist: None  Interval Summary   Pt reports she feels well today.  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  Denies pain, shortness of breath.   Vital Signs    Vitals:   09/19/23 0200 09/19/23 0400 09/19/23 0610 09/19/23 0700  BP: 132/78 (!) 142/80 (!) 161/92 (!) 141/78  Pulse: 80  80 80  Resp: (!) 22  20 (!) 21  Temp:      TempSrc:      SpO2: 96%  95% 94%  Weight:      Height:        Intake/Output Summary (Last 24 hours) at 09/19/2023 0841 Last data filed at 09/19/2023 0630 Gross per 24 hour  Intake 120 ml  Output 700 ml  Net -580 ml   Filed Weights   09/15/23 1255 09/16/23 1320  Weight: 81.6 kg 89.5 kg    Physical Exam    GEN- elderly female lying in bed in NAD, alert and oriented to self, place.   Lungs- Clear to ausculation bilaterally, normal work of breathing Cardiac- Regular rate and rhythm (paced), no murmurs, rubs or gallops GI- soft, NT, ND, + BS Extremities- no clubbing or cyanosis. No edema  Telemetry    VP 80 (VVI 80 bpm), underlying CHB with narrow escape in 30's (personally reviewed)  Hospital Course    Katrina [name] is a 89 y.o. female with PMH of paroxysmal AF, DVT/PE on eliquis admitted for 09/15/23 after being found down at home. Found to have CHB. On beta blocker pre-admit & therapeutic on digoxin. Femoral TVP placed. Pending PPM.   Assessment & Plan    CHB  Prior conduction disease > RBBB in 2021 -was on Toprol 50 mg daily -digoxin level 1 on admit  -TVP wire site wnl, underlying rhythm CHB with narrow escape in 30's -hold beta blockers, digoxin -eliquis on hold for possible PPM  -NPO for procedure  -last dose DVT lovenox 1/26, hold for procedure. SCD's.   Hx Paroxysmal AF -seen on EKG 02/2020   Thrombocytopenia  -admit platelets 185, then 130's, 1/27 are 109   Unstageable Pressure Ulcers  -per TRH  -increases risk  of infection for implant   Metabolic Encephalopathy +/- Underlying Dementia  -present on admit, per primary   Hx DVT / PE  -eliquis as outpatient, on hold for PPM  -SCD's   Hypothyroidism  -TSH 0.59 on admit     Attempted to call family member listed in chart, Sadie Orellana, to review procedure. No answer, message left for return call.    For questions or updates, please contact [facility] Please consult [email or web address] for contact info under Cardiology/STEMI.  Signed, Canary Brim, NP-C, AGACNP-BC [facility] - Electrophysiology  09/19/2023, 8:41 AM  EP Attending  Patient seen. I discussed the PPM insertion with her and she wishes to proceed. She has persistent CHB. We will undergo PPM insertion via the left SCV.   Sharlot Gowda Carla Rashad,MD

## 2023-09-18 NOTE — Progress Notes (Signed)
Electrophysiology Progress Note  Patient Name: Katrina [name] Date of Encounter: 09/18/2023  Primary Cardiologist: None   Subjective   Did not sleep well last night.  Inpatient Medications    Scheduled Meds:  Chlorhexidine Gluconate Cloth  6 each Topical Daily   enoxaparin (LOVENOX) injection  40 mg Subcutaneous Q24H   hydrALAZINE  25 mg Oral Q8H   ketoconazole  1 Application Topical Daily   levothyroxine  125 mcg Oral Q0600   multivitamin  1 tablet Oral Daily   mouth rinse  15 mL Mouth Rinse 4 times per day   potassium chloride  10 mEq Oral Daily   sodium chloride flush  3 mL Intravenous Q12H   Continuous Infusions:   PRN Meds: acetaminophen **OR** acetaminophen, mouth rinse, polyethylene glycol, polyvinyl alcohol   Vital Signs    Vitals:   09/18/23 0338 09/18/23 0400 09/18/23 0500 09/18/23 0600  BP:  (!) 160/131 127/85 (!) 136/96  Pulse:  80 80 80  Resp:      Temp: 97.9 F (36.6 C)     TempSrc: Axillary     SpO2:  95% 96% 98%  Weight:      Height:        Intake/Output Summary (Last 24 hours) at 09/18/2023 0740 Last data filed at 09/18/2023 0600 Gross per 24 hour  Intake 523.7 ml  Output 300 ml  Net 223.7 ml   Filed Weights   09/15/23 1255 09/16/23 1320  Weight: 81.6 kg 89.5 kg    Telemetry    Paced rhythm - Personally Reviewed  ECG    2:1 AV block - Personally Reviewed  Physical Exam   GEN: No acute distress.   Neck: No JVD Cardiac: RRR, no murmurs, rubs, or gallops.  Respiratory: Clear to auscultation bilaterally. GI: Soft, nontender, non-distended  MS: No edema; No deformity. Neuro:  Nonfocal  Psych: Normal affect   Labs    Chemistry Recent Labs  Lab 09/15/23 1330 09/16/23 0533 09/17/23 0251 09/18/23 0341  NA 137 139 140 142  K 4.6 3.8 3.9 4.0  CL 106 107 110 113*  CO2 21* 21* 20* 17*  GLUCOSE 136* 86 84 117*  BUN 20 25* 22 18  CREATININE 1.09* 1.15* 1.00 1.04*  CALCIUM 9.2 8.9 8.7* 8.5*  PROT 8.0  --   --   --   ALBUMIN  3.6  --   --   --   AST 27  --   --   --   ALT 17  --   --   --   ALKPHOS 83  --   --   --   BILITOT 0.8  --   --   --   GFRNONAA 49* 46* 54* 51*  ANIONGAP 10 11 10 12      Hematology Recent Labs  Lab 09/16/23 0533 09/17/23 0251 09/18/23 0341  WBC 6.9 7.0 6.5  RBC 4.36 4.23 4.45  HGB 12.7 12.1 12.9  HCT 39.1 37.2 38.8  MCV 89.7 87.9 87.2  MCH 29.1 28.6 29.0  MCHC 32.5 32.5 33.2  RDW 14.5 14.1 14.1  PLT 135* 134* 144*    Cardiac EnzymesNo results for input(s): "TROPONINI" in the last 168 hours. No results for input(s): "TROPIPOC" in the last 168 hours.   BNPNo results for input(s): "BNP", "PROBNP" in the last 168 hours.   DDimer No results for input(s): "DDIMER" in the last 168 hours.   Summary of Pertinent studies    TTE: 09/16/2023 EF 55-60%,Gr I  diastolic dysfunction. Degenerative MV with mild MR.   Patient Profile     89 y.o. female with a hx of DVT/PE,  AFib, HTN admitted after being found down with complete heart block.  Assessment & Plan    Complete heart block With pre-existing conduction disease On metoprolol XL 50 mg daily and digoxin (level 1.0 on admit) on admit Temporary pacing wire placed by femoral access. Underlying is heart block with a ventricular rhythm of about 35 BPM Hold beta-blockers and digoxin Eliquis held due to possible pacemaker placement Make NPO tomorrow for possible pacemaker placement.   Pressure ulcers Unstageable ulcers on the buttocks, Gr II sacral ulcer  Increases infection risk of the device  Encephalopathy Present on admission --significantly improved She remains confused, but per family (sister-in-law) she is at baseline  History of paroxysmal atrial fibrillation See ECGs Jul 2021  Hypothyroidism TSH 0.59 this admission  History of DVT/PE On eliquis as outpatient. SCDs ordered   For questions or updates, please contact [facility] Please consult [email or web address] for contact info under Cardiology/STEMI.       Signed, Maurice Small, MD 09/18/2023, 7:40 AM

## 2023-09-19 ENCOUNTER — Encounter (HOSPITAL_COMMUNITY): Payer: Self-pay | Admitting: Cardiovascular Disease

## 2023-09-19 ENCOUNTER — Encounter (HOSPITAL_COMMUNITY)
Admission: EM | Disposition: A | Discharge status: Home / Self Care | Payer: Self-pay | Source: Skilled Nursing Facility | Attending: Internal Medicine

## 2023-09-19 DIAGNOSIS — R001 Bradycardia, unspecified: Secondary | ICD-10-CM | POA: Diagnosis not present

## 2023-09-19 DIAGNOSIS — I442 Atrioventricular block, complete: Secondary | ICD-10-CM | POA: Diagnosis not present

## 2023-09-19 HISTORY — PX: PACEMAKER IMPLANT: EP1218

## 2023-09-19 LAB — BASIC METABOLIC PANEL
Anion gap: 10 (ref 5–15)
BUN: 19 mg/dL (ref 8–23)
CO2: 19 mmol/L — ABNORMAL LOW (ref 22–32)
Calcium: 8.4 mg/dL — ABNORMAL LOW (ref 8.9–10.3)
Chloride: 108 mmol/L (ref 98–111)
Creatinine, Ser: 0.95 mg/dL (ref 0.44–1.00)
GFR, Estimated: 57 mL/min — ABNORMAL LOW (ref >60)
Glucose, Bld: 100 mg/dL — ABNORMAL HIGH (ref 70–99)
Potassium: 3.7 mmol/L (ref 3.5–5.1)
Sodium: 137 mmol/L (ref 135–145)

## 2023-09-19 LAB — CBC
HCT: 36.8 % (ref 36.0–46.0)
Hemoglobin: 12.1 g/dL (ref 12.0–15.0)
MCH: 29 pg (ref 26.0–34.0)
MCHC: 32.9 g/dL (ref 30.0–36.0)
MCV: 88.2 fL (ref 80.0–100.0)
Platelets: 109 10*3/uL — ABNORMAL LOW (ref 150–400)
RBC: 4.17 MIL/uL (ref 3.87–5.11)
RDW: 14.2 % (ref 11.5–15.5)
WBC: 6.3 10*3/uL (ref 4.0–10.5)
nRBC: 0 % (ref 0.0–0.2)

## 2023-09-19 SURGERY — PACEMAKER IMPLANT

## 2023-09-19 MED ORDER — LIDOCAINE HCL (PF) 1 % IJ SOLN
INTRAMUSCULAR | Status: DC | PRN
  Administered 2023-09-19: 60 mL via INTRADERMAL

## 2023-09-19 MED ORDER — HEPARIN (PORCINE) IN NACL 1000-0.9 UT/500ML-% IV SOLN
INTRAVENOUS | Status: DC | PRN
  Administered 2023-09-19: 500 mL

## 2023-09-19 MED ORDER — IOHEXOL 350 MG/ML SOLN
INTRAVENOUS | Status: DC | PRN
  Administered 2023-09-19: 20 mL

## 2023-09-19 MED ORDER — CHLORHEXIDINE GLUCONATE 4 % EX SOLN
60.0000 mL | Freq: Once | CUTANEOUS | Status: AC
  Administered 2023-09-19: 4 via TOPICAL

## 2023-09-19 MED ORDER — SODIUM CHLORIDE 0.9 % IV SOLN
750.0000 mg | Freq: Once | INTRAVENOUS | Status: AC
  Administered 2023-09-20: 750 mg via INTRAVENOUS
  Filled 2023-09-19: qty 15

## 2023-09-19 MED ORDER — VANCOMYCIN HCL IN DEXTROSE 1-5 GM/200ML-% IV SOLN
INTRAVENOUS | Status: AC
  Filled 2023-09-19: qty 200

## 2023-09-19 MED ORDER — SODIUM CHLORIDE 0.9 % IV SOLN: INTRAVENOUS | Status: DC

## 2023-09-19 MED ORDER — VANCOMYCIN HCL IN DEXTROSE 1-5 GM/200ML-% IV SOLN
1000.0000 mg | INTRAVENOUS | Status: AC
  Administered 2023-09-19: 1000 mg via INTRAVENOUS

## 2023-09-19 MED ORDER — CHLORHEXIDINE GLUCONATE 4 % EX SOLN
60.0000 mL | Freq: Once | CUTANEOUS | Status: AC
  Administered 2023-09-19: 4 via TOPICAL
  Filled 2023-09-19: qty 60

## 2023-09-19 MED ORDER — LIDOCAINE HCL (PF) 1 % IJ SOLN
INTRAMUSCULAR | Status: AC
  Filled 2023-09-19: qty 60

## 2023-09-19 MED ORDER — ONDANSETRON HCL 4 MG/2ML IJ SOLN: 4.0000 mg | Freq: Four times a day (QID) | INTRAMUSCULAR | Status: DC | PRN

## 2023-09-19 MED ORDER — SODIUM CHLORIDE 0.9 % IV SOLN
INTRAVENOUS | Status: AC
  Filled 2023-09-19: qty 2

## 2023-09-19 MED ORDER — VANCOMYCIN HCL 750 MG/150ML IV SOLN
750.0000 mg | Freq: Two times a day (BID) | INTRAVENOUS | Status: DC
  Filled 2023-09-19: qty 150

## 2023-09-19 MED ORDER — SODIUM CHLORIDE 0.9 % IV SOLN
80.0000 mg | INTRAVENOUS | Status: AC
  Administered 2023-09-19: 80 mg
  Filled 2023-09-19: qty 2

## 2023-09-19 SURGICAL SUPPLY — 10 items
CABLE ADAPT PACING TEMP 12FT (ADAPTER) IMPLANT
GUIDEWIRE ANGLED .035X150CM (WIRE) IMPLANT
LEAD ULTIPACE 46 LPA1231/46 (Lead) IMPLANT
LEAD ULTIPACE 52 LPA1231/52 (Lead) IMPLANT
PACEMAKER ASSURITY DR-RF (Pacemaker) IMPLANT
PAD DEFIB RADIO PHYSIO CONN (PAD) ×2 IMPLANT
SHEATH 7FR PRELUDE SNAP 13 (SHEATH) IMPLANT
SHEATH 9FR PRELUDE SNAP 13 (SHEATH) IMPLANT
TRAY PACEMAKER INSERTION (PACKS) ×2 IMPLANT
WIRE MICRO SET SILHO 5FR 7 (SHEATH) IMPLANT

## 2023-09-19 NOTE — TOC Initial Note (Signed)
Transition of Care [facility]) - Initial/Assessment Note    Patient Details  Name: Katrina [name] MRN: [id number] Date of Birth: 03/09/1932  Transition of Care [facility]) CM/SW Contact:    Delilah Shan, LCSWA Phone Number: 09/19/2023, 11:44 AM  Clinical Narrative:                    Patient is from [facility]. Ivy with [facility] informed CSW patient can return when medically ready. Ivy informed CSW if PT recommends HHPT and patient agreeable to receive when she returns that CSW can send over orders with DC summary and FL2 when medically ready Contact name at [facility] is Lajoyce Corners [phone] EX: [phone]. Fax # [phone]. CSW will continue to follow and assist with patients dc planning needs.     Patient Goals and CMS Choice            Expected Discharge Plan and Services                                              Prior Living Arrangements/Services                       Activities of Daily Living   ADL Screening (condition at time of admission) Independently performs ADLs?: Yes (appropriate for developmental age) Is the patient deaf or have difficulty hearing?: No Does the patient have difficulty seeing, even when wearing glasses/contacts?: No Does the patient have difficulty concentrating, remembering, or making decisions?: No  Permission Sought/Granted                  Emotional Assessment              Admission diagnosis:  Bradycardia [R00.1] Falls [R29.6] Altered mental status, unspecified altered mental status type [R41.82] Patient Active Problem List   Diagnosis Date Noted   Second degree AV block 09/15/2023   Bradycardia 09/15/2023   Ureteral stone 10/20/2021   Cirrhosis of liver (HCC) 10/04/2021   Leukocytosis 10/04/2021   Decubitus ulcer of coccyx, stage 2 (HCC) 10/02/2021   Pyohydronephrosis 09/26/2021   Acquired thrombophilia (HCC) 02/18/2021   AKI (acute kidney injury) (HCC) 02/18/2021   Acute  respiratory failure (HCC) 02/18/2021   Anemia 02/18/2021   Candidal intertrigo 02/18/2021   Cellulitis of right lower limb 02/18/2021   Chronic venous hypertension (idiopathic) with inflammation of right lower extremity 02/18/2021   Gait abnormality 02/18/2021   Physical debility 02/18/2021   Iron deficiency anemia due to chronic blood loss 02/18/2021   Localized edema 02/18/2021   Metabolic encephalopathy 02/18/2021   Pneumonia 02/18/2021   Pressure injury of sacral region, stage 3 (HCC) 02/18/2021   Problem related to unspecified psychosocial circumstances 02/18/2021   Rectal bleeding 02/18/2021   Sepsis (HCC) 02/18/2021   Urinary tract infectious disease 02/18/2021   Unspecified open wound of left buttock, initial encounter 02/18/2021   Local infection of the skin and subcutaneous tissue, unspecified 11/08/2019   Right lower quadrant pain 11/08/2019   Paroxysmal atrial fibrillation (HCC) 01/04/2019   Disorder of the skin and subcutaneous tissue, unspecified 11/07/2017   Sleep apnea 01/10/2017   Hypertension 01/10/2017   Hypothyroidism 01/10/2017   Overactive bladder 01/10/2017   Vertigo 01/10/2017   Elevated lipids 01/10/2017   Disequilibrium 10/05/2016   Nasal congestion 08/26/2016   Morbid obesity (HCC) 10/03/2015  Encounter for general adult medical examination without abnormal findings 09/26/2015   Impaired fasting glucose 09/26/2014   Long term (current) use of anticoagulants 09/26/2014   Varicose veins of lower extremity with ulcer (HCC) 05/02/2013   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   Underimmunization status 05/04/2010   Personal history of other diseases of urinary system 01/28/2010   Thromboembolism of vein 09/09/2009   History of gastrointestinal disease 07/28/2009   Major depression, single episode 07/28/2009   Peripheral venous insufficiency 07/28/2009   Supraventricular tachycardia (HCC) 07/28/2009   PCP:  Garlan Fillers, MD Pharmacy:    [facility] [address] [place] - [address] AT [facility] [address] [place] [address] Phone: [phone] Fax: [phone]     Social Drivers of Health (SDOH) Social History: SDOH Screenings   Food Insecurity: Patient Unable To Answer (09/16/2023)  Housing: Patient Unable To Answer (09/16/2023)  Transportation Needs: Patient Unable To Answer (09/16/2023)  Utilities: Patient Unable To Answer (09/16/2023)  Social Connections: Patient Unable To Answer (09/16/2023)  Tobacco Use: Low Risk  (02/03/2023)   SDOH Interventions:     Readmission Risk Interventions     No data to display

## 2023-09-19 NOTE — Progress Notes (Signed)
Ref: Garlan Fillers, MD   Subjective:  Awake. Awaiting permanent pacemaker placement. VS stable with temporary pacemaker. HR remains in 30's when pacemaker is off.  Objective:  Vital Signs in the last 24 hours: Pulse Rate:  [79-81] 80 (01/27 0700) Cardiac Rhythm: Heart block;Ventricular paced (01/27 0800) Resp:  [14-26] 21 (01/27 0700) BP: (111-161)/(58-121) 141/78 (01/27 0700) SpO2:  [92 %-98 %] 94 % (01/27 0700)  Physical Exam: BP Readings from Last 1 Encounters:  09/19/23 (!) 141/78     Wt Readings from Last 1 Encounters:  09/16/23 89.5 kg    Weight change:  Body mass index is 37.28 kg/m. HEENT: [place]/AT, Eyes-Blue, Conjunctiva-Pink, Sclera-Non-icteric Neck: No JVD, No bruit, Trachea midline. Lungs:  Clear, Bilateral. Cardiac:  Paced, Regular rhythm, normal S1 and S2, no S3. III/VI systolic murmur. Abdomen:  Soft, non-tender. BS present. Extremities:  No edema present. No cyanosis. No clubbing. CNS: AxOx2, Cranial nerves grossly intact, moves all 4 extremities.  Skin: Warm and dry.   Intake/Output from previous day: 01/26 0701 - 01/27 0700 In: 240 [P.O.:240] Out: 700 [Urine:700]    Lab Results: BMET    Component Value Date/Time   NA 137 09/19/2023 0238   NA 142 09/18/2023 0341   NA 140 09/17/2023 0251   NA 137 10/06/2021 0000   K 3.7 09/19/2023 0238   K 4.0 09/18/2023 0341   K 3.9 09/17/2023 0251   CL 108 09/19/2023 0238   CL 113 (H) 09/18/2023 0341   CL 110 09/17/2023 0251   CO2 19 (L) 09/19/2023 0238   CO2 17 (L) 09/18/2023 0341   CO2 20 (L) 09/17/2023 0251   GLUCOSE 100 (H) 09/19/2023 0238   GLUCOSE 117 (H) 09/18/2023 0341   GLUCOSE 84 09/17/2023 0251   BUN 19 09/19/2023 0238   BUN 18 09/18/2023 0341   BUN 22 09/17/2023 0251   BUN 18 10/06/2021 0000   CREATININE 0.95 09/19/2023 0238   CREATININE 1.04 (H) 09/18/2023 0341   CREATININE 1.00 09/17/2023 0251   CALCIUM 8.4 (L) 09/19/2023 0238   CALCIUM 8.5 (L) 09/18/2023 0341   CALCIUM 8.7 (L)  09/17/2023 0251   GFRNONAA 57 (L) 09/19/2023 0238   GFRNONAA 51 (L) 09/18/2023 0341   GFRNONAA 54 (L) 09/17/2023 0251   GFRAA >60 03/11/2020 0819   GFRAA >60 03/09/2020 1750   GFRAA >60 06/10/2016 1023   CBC    Component Value Date/Time   WBC 6.3 09/19/2023 0238   RBC 4.17 09/19/2023 0238   HGB 12.1 09/19/2023 0238   HCT 36.8 09/19/2023 0238   PLT 109 (L) 09/19/2023 0238   MCV 88.2 09/19/2023 0238   MCH 29.0 09/19/2023 0238   MCHC 32.9 09/19/2023 0238   RDW 14.2 09/19/2023 0238   LYMPHSABS 0.9 09/15/2023 1315   MONOABS 0.2 09/15/2023 1315   EOSABS 0.0 09/15/2023 1315   BASOSABS 0.0 09/15/2023 1315   HEPATIC Function Panel Recent Labs    09/15/23 1330  PROT 8.0  ALBUMIN 3.6  AST 27  ALT 17  ALKPHOS 83   HEMOGLOBIN A1C No results found for: "MPG" CARDIAC ENZYMES Lab Results  Component Value Date   CKTOTAL 242 (H) 05/10/2021   TROPONINI <0.03 06/10/2016   TROPONINI <0.03 08/27/2015   BNP No results for input(s): "PROBNP" in the last 8760 hours. TSH Recent Labs    09/15/23 1315  TSH 0.590   CHOLESTEROL Recent Labs    09/16/23 0930  CHOL 163    Scheduled Meds:  Chlorhexidine Gluconate Cloth  6 each Topical Daily   gentamicin (GARAMYCIN) 80 mg in sodium chloride 0.9 % 500 mL irrigation  80 mg Irrigation On Call   hydrALAZINE  25 mg Oral Q8H   ketoconazole  1 Application Topical Daily   levothyroxine  125 mcg Oral Q0600   multivitamin  1 tablet Oral Daily   mouth rinse  15 mL Mouth Rinse 4 times per day   potassium chloride  10 mEq Oral Daily   sodium chloride flush  3 mL Intravenous Q12H   Continuous Infusions:  sodium chloride 50 mL/hr at 09/19/23 0912   sodium chloride 50 mL/hr at 09/19/23 0908   vancomycin     PRN Meds:.acetaminophen **OR** acetaminophen, mouth rinse, polyethylene glycol, polyvinyl alcohol  Assessment/Plan: 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 Chronic dementia HTN Hypothyroidism Type 2 DM H/O DVT H/O Pulmonary embolism H/O  atrial fibrillation  Plan: Awaiting permanent pacemaker today.   LOS: 4 days   Time spent including chart review, lab review, examination, discussion with patient/Nurse/doctor : 30 min   Orpah Cobb  MD  09/19/2023, 9:25 AM

## 2023-09-19 NOTE — Progress Notes (Addendum)
Spoke with patients POA, Sadie Lindamood, regarding pacemaker procedure.  Sadie indicates she is Paizlee's closest living relative - she was married to her brother.  She is aware of impending procedure and is ok with moving forward.  Risks / benefits discussed with patient & Sadie via phone. Questions answered.     Canary Brim, NP-C, AGACNP-BC [facility] - Electrophysiology  09/19/2023, 1:00 PM

## 2023-09-19 NOTE — Progress Notes (Signed)
Spoke with the patient's sister-in-law and healthcare power-of-attorney to obtain consent for the procedure due to the patient's altered mental status.  Telephone consent was witnessed by myself and Electa Sniff, RN.

## 2023-09-19 NOTE — Interval H&P Note (Signed)
History and Physical Interval Note:  09/19/2023 9:22 AM  Katrina [name]  has presented today for surgery, with the diagnosis of heart block.  The various methods of treatment have been discussed with the patient and family. After consideration of risks, benefits and other options for treatment, the patient has consented to  Procedure(s): PACEMAKER IMPLANT (N/A) as a surgical intervention.  The patient's history has been reviewed, patient examined, no change in status, stable for surgery.  I have reviewed the patient's chart and labs.  Questions were answered to the patient's satisfaction.     Lewayne Bunting

## 2023-09-19 NOTE — Discharge Instructions (Addendum)
After Your Pacemaker   You have a [employer] Pacemaker  ACTIVITY Do not lift your arm above shoulder height for 1 week after your procedure. After 7 days, you may progress as below.  You should remove your sling 24 hours after your procedure, unless otherwise instructed by your provider.     Monday September 26, 2023  Tuesday September 27, 2023 Wednesday September 28, 2023 Thursday September 29, 2023   Do not lift, push, pull, or carry anything over 10 pounds with the affected arm until 6 weeks (Monday October 31, 2023 ) after your procedure.   You may drive AFTER your wound check, unless you have been told otherwise by your provider.   Ask your healthcare provider when you can go back to work   INCISION/Dressing You may resume Eliquis 5 days post implant > restart on 09/25/23  If large square, outer bandage is left in place, this can be removed after 24 hours from your procedure. Do not remove steri-strips or glue as below.   If a PRESSURE DRESSING (a bulky dressing that usually goes up over your shoulder) was applied or left in place, please follow instructions given by your provider on when to return to have this removed.   Monitor your Pacemaker site for redness, swelling, and drainage. Call the device clinic at [phone] if you experience these symptoms or fever/chills.  If your incision is sealed with Steri-strips or staples, you may shower 7 days after your procedure or when told by your provider. Do not remove the steri-strips or let the shower hit directly on your site. You may wash around your site with soap and water.    If you were discharged in a sling, please do not wear this during the day more than 48 hours after your surgery unless otherwise instructed. This may increase the risk of stiffness and soreness in your shoulder.   Avoid lotions, ointments, or perfumes over your incision until it is well-healed.  You may use a hot tub or a pool AFTER your wound check appointment if the  incision is completely closed.  Pacemaker Alerts:  Some alerts are vibratory and others beep. These are NOT emergencies. Please call our office to let us know. If this occurs at night or on weekends, it can wait until the next business day. Send a remote transmission.  If your device is capable of reading fluid status (for heart failure), you will be offered monthly monitoring to review this with you.   DEVICE MANAGEMENT Remote monitoring is used to monitor your pacemaker from home. This monitoring is scheduled every 91 days by our office. It allows [name] to keep an eye on the functioning of your device to ensure it is working properly. You will routinely see your Electrophysiologist annually (more often if necessary).   You should receive your ID card for your new device in 4-8 weeks. Keep this card with you at all times once received. Consider wearing a medical alert bracelet or necklace.  Your Pacemaker may be MRI compatible. This will be discussed at your next office visit/wound check.  You should avoid contact with strong electric or magnetic fields.   Do not use amateur (ham) radio equipment or electric (arc) welding torches. MP3 player headphones with magnets should not be used. Some devices are safe to use if held at least 12 inches (30 cm) from your Pacemaker. These include power tools, lawn mowers, and speakers. If you are unsure if something is safe to use, ask  your health care provider.  When using your cell phone, hold it to the ear that is on the opposite side from the Pacemaker. Do not leave your cell phone in a pocket over the Pacemaker.  You may safely use electric blankets, heating pads, computers, and microwave ovens.  Call the office right away if: You have chest pain. You feel more short of breath than you have felt before. You feel more light-headed than you have felt before. Your incision starts to open up.  This information is not intended to replace advice given to you  by your health care provider. Make sure you discuss any questions you have with your health care provider.

## 2023-09-19 NOTE — Progress Notes (Signed)
PHARMACY NOTE:  ANTIMICROBIAL RENAL DOSAGE ADJUSTMENT  Current antimicrobial regimen includes a mismatch between antimicrobial dosage and estimated renal function.  As per policy approved by the Pharmacy & Therapeutics and Medical Executive Committees, the antimicrobial dosage will be adjusted accordingly.  Current antimicrobial dosage:  vancomycin 1g q12hr x1  Indication: surgical prophylaxis  Renal Function:  Estimated Creatinine Clearance: 40.9 mL/min (by C-G formula based on SCr of 0.95 mg/dL).   Antimicrobial dosage has been changed to:  750mg  q18 hr x1     Thank you for allowing pharmacy to be a part of this patient's care.  Alphia Moh, PharmD, BCPS, BCCP Clinical Pharmacist  Please check AMION for all [facility] phone numbers After 10:00 PM, call Main Pharmacy [phone]

## 2023-09-19 NOTE — Progress Notes (Signed)
PROGRESS NOTE    Katrina [name]  AVW:[id number] DOB: Dec 31, 1931 DOA: 09/15/2023 PCP: Garlan Fillers, MD   Brief Narrative:  Patient is an 89 year old female, past medical history significant for PAF on Eliquis, h/o DVT and PE, history of renal stones, and hypertension.  There are concerns for likely underlying dementia.  Electrophysiology team is directing care for complete heart block.  Patient is currently on temporary pacer.  For possible permanent pacemaker placement tomorrow.  Electrophysiology team is directing care.  Input is highly appreciated.   Assessment & Plan:   Principal Problem:   Bradycardia Active Problems:   Supraventricular tachycardia (HCC)   Second degree AV block  Complete heart block:  -Patient presented with altered mental status and headache -Dig level 1.0, TSH and electrolytes within normal range -Cardiology consulted, patient received multiple doses of atropine -Digoxin and metoprolol have been discontinued.   -Electrophysiology team is directing care.   -1/24 s/p temporary Central Venous Pacemaker insertion as per EP cardiology, maintain pacing rate at 80 to suppress ectopy -Likely permanent pacemaker placement today 09/19/2023.   Hypothyroidism: -Continue Synthroid   Encephalopathy/possible dementing illness: Patient said to be at baseline.  She is fully alert and oriented today.  DVT prophylaxis: Place and maintain sequential compression device Start: 09/18/23 1935 Place and maintain sequential compression device Start: 09/16/23 1839 SCDs Start: 09/15/23 2156   Code Status: Full Code  Family Communication:  None present at bedside.  Plan of care discussed with patient in length and he/she verbalized understanding and agreed with it.  Status is: Inpatient Remains inpatient appropriate because: Scheduled for permanent pacemaker placement today   Estimated body mass index is 37.28 kg/m as calculated from the following:   Height as of this  encounter: 5\' 1"  (1.549 m).   Weight as of this encounter: 89.5 kg.  Pressure Injury Buttocks Right;Lower Deep Tissue Pressure Injury - Purple or maroon localized area of discolored intact skin or blood-filled blister due to damage of underlying soft tissue from pressure and/or shear. see picture- purple area noted slight (Active)     Location: Buttocks  Location Orientation: Right;Lower  Staging: Deep Tissue Pressure Injury - Purple or maroon localized area of discolored intact skin or blood-filled blister due to damage of underlying soft tissue from pressure and/or shear.  Wound Description (Comments): see picture- purple area noted slightly blanchable  Present on Admission: Yes  Dressing Type Foam - Lift dressing to assess site every shift 09/19/23 0800   Nutritional Assessment: Body mass index is 37.28 kg/m.Marland Kitchen Seen by dietician.  I agree with the assessment and plan as outlined below: Nutrition Status:        . Skin Assessment: I have examined the patient's skin and I agree with the wound assessment as performed by the wound care RN as outlined below: Pressure Injury Buttocks Right;Lower Deep Tissue Pressure Injury - Purple or maroon localized area of discolored intact skin or blood-filled blister due to damage of underlying soft tissue from pressure and/or shear. see picture- purple area noted slight (Active)     Location: Buttocks  Location Orientation: Right;Lower  Staging: Deep Tissue Pressure Injury - Purple or maroon localized area of discolored intact skin or blood-filled blister due to damage of underlying soft tissue from pressure and/or shear.  Wound Description (Comments): see picture- purple area noted slightly blanchable  Present on Admission: Yes  Dressing Type Foam - Lift dressing to assess site every shift 09/19/23 0800    Consultants:  Cardiology  Procedures:  As above  Antimicrobials:  Anti-infectives (From admission, onward)    Start     Dose/Rate  Route Frequency Ordered Stop   09/19/23 1100  gentamicin (GARAMYCIN) 80 mg in sodium chloride 0.9 % 500 mL irrigation        80 mg Irrigation On call 09/19/23 0845 09/20/23 1100   09/19/23 1100  vancomycin (VANCOCIN) IVPB 1000 mg/200 mL premix        1,000 mg 200 mL/hr over 60 Minutes Intravenous On call 09/19/23 0845 09/20/23 1100         Subjective: Patient seen and examined.  She has no complaints at all.  She is fully alert and oriented.  Objective: Vitals:   09/19/23 0200 09/19/23 0400 09/19/23 0610 09/19/23 0700  BP: 132/78 (!) 142/80 (!) 161/92 (!) 141/78  Pulse: 80  80 80  Resp: (!) 22  20 (!) 21  Temp:      TempSrc:      SpO2: 96%  95% 94%  Weight:      Height:        Intake/Output Summary (Last 24 hours) at 09/19/2023 1051 Last data filed at 09/19/2023 0630 Gross per 24 hour  Intake 120 ml  Output 700 ml  Net -580 ml   Filed Weights   09/15/23 1255 09/16/23 1320  Weight: 81.6 kg 89.5 kg    Examination:  General exam: Appears calm and comfortable  Respiratory system: Clear to auscultation. Respiratory effort normal. Cardiovascular system: S1 & S2 heard, RRR. No JVD, murmurs, rubs, gallops or clicks. No pedal edema. Gastrointestinal system: Abdomen is nondistended, soft and nontender. No organomegaly or masses felt. Normal bowel sounds heard. Central nervous system: Alert and oriented. No focal neurological deficits. Extremities: Symmetric 5 x 5 power. Skin: No rashes, lesions or ulcers Psychiatry: Judgement and insight appear normal. Mood & affect appropriate.    Data Reviewed: I have personally reviewed following labs and imaging studies  CBC: Recent Labs  Lab 09/15/23 1315 09/16/23 0533 09/17/23 0251 09/18/23 0341 09/19/23 0238  WBC 8.4 6.9 7.0 6.5 6.3  NEUTROABS 7.2  --   --   --   --   HGB 12.9 12.7 12.1 12.9 12.1  HCT 38.8 39.1 37.2 38.8 36.8  MCV 89.0 89.7 87.9 87.2 88.2  PLT 185 135* 134* 144* 109*   Basic Metabolic Panel: Recent Labs   Lab 09/15/23 1330 09/16/23 0034 09/16/23 0533 09/17/23 0251 09/18/23 0341 09/19/23 0238  NA 137  --  139 140 142 137  K 4.6  --  3.8 3.9 4.0 3.7  CL 106  --  107 110 113* 108  CO2 21*  --  21* 20* 17* 19*  GLUCOSE 136*  --  86 84 117* 100*  BUN 20  --  25* 22 18 19   CREATININE 1.09*  --  1.15* 1.00 1.04* 0.95  CALCIUM 9.2  --  8.9 8.7* 8.5* 8.4*  MG  --  2.1  --  2.0  --   --   PHOS  --   --   --  2.8  --   --    GFR: Estimated Creatinine Clearance: 40.9 mL/min (by C-G formula based on SCr of 0.95 mg/dL). Liver Function Tests: Recent Labs  Lab 09/15/23 1330  AST 27  ALT 17  ALKPHOS 83  BILITOT 0.8  PROT 8.0  ALBUMIN 3.6   Recent Labs  Lab 09/15/23 1330  LIPASE 40   Recent Labs  Lab 09/15/23 1515  AMMONIA <10  Coagulation Profile: Recent Labs  Lab 09/16/23 0533  INR 1.5*   Cardiac Enzymes: No results for input(s): "CKTOTAL", "CKMB", "CKMBINDEX", "TROPONINI" in the last 168 hours. BNP (last 3 results) No results for input(s): "PROBNP" in the last 8760 hours. HbA1C: No results for input(s): "HGBA1C" in the last 72 hours. CBG: No results for input(s): "GLUCAP" in the last 168 hours. Lipid Profile: No results for input(s): "CHOL", "HDL", "LDLCALC", "TRIG", "CHOLHDL", "LDLDIRECT" in the last 72 hours. Thyroid Function Tests: No results for input(s): "TSH", "T4TOTAL", "FREET4", "T3FREE", "THYROIDAB" in the last 72 hours. Anemia Panel: No results for input(s): "VITAMINB12", "FOLATE", "FERRITIN", "TIBC", "IRON", "RETICCTPCT" in the last 72 hours. Sepsis Labs: Recent Labs  Lab 09/15/23 1315  LATICACIDVEN 1.4    Recent Results (from the past 240 hours)  Resp panel by RT-PCR (RSV, Flu A&B, Covid) Anterior Nasal Swab     Status: None   Collection Time: 09/15/23  1:00 PM   Specimen: Anterior Nasal Swab  Result Value Ref Range Status   SARS Coronavirus 2 by RT PCR NEGATIVE NEGATIVE Final   Influenza A by PCR NEGATIVE NEGATIVE Final   Influenza B by PCR  NEGATIVE NEGATIVE Final    Comment: (NOTE) The Xpert Xpress SARS-CoV-2/FLU/RSV plus assay is intended as an aid in the diagnosis of influenza from Nasopharyngeal swab specimens and should not be used as a sole basis for treatment. Nasal washings and aspirates are unacceptable for Xpert Xpress SARS-CoV-2/FLU/RSV testing.  Fact Sheet for Patients: BloggerCourse.com  Fact Sheet for Healthcare Providers: SeriousBroker.it  This test is not yet approved or cleared by the [place] FDA and has been authorized for detection and/or diagnosis of SARS-CoV-2 by FDA under an Emergency Use Authorization (EUA). This EUA will remain in effect (meaning this test can be used) for the duration of the COVID-19 declaration under Section 564(b)(1) of the Act, 21 U.S.C. section 360bbb-3(b)(1), unless the authorization is terminated or revoked.     Resp Syncytial Virus by PCR NEGATIVE NEGATIVE Final    Comment: (NOTE) Fact Sheet for Patients: BloggerCourse.com  Fact Sheet for Healthcare Providers: SeriousBroker.it  This test is not yet approved or cleared by the [place] FDA and has been authorized for detection and/or diagnosis of SARS-CoV-2 by FDA under an Emergency Use Authorization (EUA). This EUA will remain in effect (meaning this test can be used) for the duration of the COVID-19 declaration under Section 564(b)(1) of the Act, 21 U.S.C. section 360bbb-3(b)(1), unless the authorization is terminated or revoked.  Performed at [facility], [address] [place], [place] [address]   MRSA Next Gen by PCR, Nasal     Status: None   Collection Time: 09/16/23  1:30 PM   Specimen: Nasal Mucosa; Nasal Swab  Result Value Ref Range Status   MRSA by PCR Next Gen NOT DETECTED NOT DETECTED Final    Comment: (NOTE) The GeneXpert MRSA Assay (FDA approved for NASAL specimens only), is one  component of a comprehensive MRSA colonization surveillance program. It is not intended to diagnose MRSA infection nor to guide or monitor treatment for MRSA infections. Test performance is not FDA approved in patients less than 59 years old. Performed at [facility], [address], [place], [place] [address]      Radiology Studies: No results found.  Scheduled Meds:  Chlorhexidine Gluconate Cloth  6 each Topical Daily   gentamicin (GARAMYCIN) 80 mg in sodium chloride 0.9 % 500 mL irrigation  80 mg Irrigation On  Call   hydrALAZINE  25 mg Oral Q8H   ketoconazole  1 Application Topical Daily   levothyroxine  125 mcg Oral Q0600   multivitamin  1 tablet Oral Daily   mouth rinse  15 mL Mouth Rinse 4 times per day   potassium chloride  10 mEq Oral Daily   sodium chloride flush  3 mL Intravenous Q12H   Continuous Infusions:  sodium chloride 50 mL/hr at 09/19/23 0912   sodium chloride 50 mL/hr at 09/19/23 0908   vancomycin       LOS: 4 days   Hughie Closs, MD [facility]  09/19/2023, 10:51 AM   *Please note that this is a verbal dictation therefore any spelling or grammatical errors are due to the "Dragon Medical One" system interpretation.  Please page via Amion and do not message via secure chat for urgent patient care matters. Secure chat can be used for non urgent patient care matters.  How to contact the [facility] Attending or Consulting provider 7A - 7P or covering provider during after hours 7P -7A, for this patient?  Check the care team in [facility] and look for a) attending/consulting TRH provider listed and b) the [facility] team listed. Page or secure chat 7A-7P. Log into [email or web address] and use 's universal password to access. If you do not have the password, please contact the hospital operator. Locate the [facility] provider you are looking for under [facility] and page to a number that you can be directly reached. If you still have difficulty reaching the  provider, please page the [facility] (Director on Call) for the Hospitalists listed on amion for assistance.

## 2023-09-20 ENCOUNTER — Inpatient Hospital Stay (HOSPITAL_COMMUNITY): Payer: Medicare Other

## 2023-09-20 ENCOUNTER — Encounter (HOSPITAL_COMMUNITY): Payer: Self-pay | Admitting: Internal Medicine

## 2023-09-20 DIAGNOSIS — R001 Bradycardia, unspecified: Secondary | ICD-10-CM | POA: Diagnosis not present

## 2023-09-20 LAB — CBC
HCT: 37.7 % (ref 36.0–46.0)
Hemoglobin: 12.2 g/dL (ref 12.0–15.0)
MCH: 28.4 pg (ref 26.0–34.0)
MCHC: 32.4 g/dL (ref 30.0–36.0)
MCV: 87.7 fL (ref 80.0–100.0)
Platelets: 119 10*3/uL — ABNORMAL LOW (ref 150–400)
RBC: 4.3 MIL/uL (ref 3.87–5.11)
RDW: 14.3 % (ref 11.5–15.5)
WBC: 7.2 10*3/uL (ref 4.0–10.5)
nRBC: 0 % (ref 0.0–0.2)

## 2023-09-20 LAB — BASIC METABOLIC PANEL
Anion gap: 11 (ref 5–15)
BUN: 18 mg/dL (ref 8–23)
CO2: 19 mmol/L — ABNORMAL LOW (ref 22–32)
Calcium: 8.6 mg/dL — ABNORMAL LOW (ref 8.9–10.3)
Chloride: 112 mmol/L — ABNORMAL HIGH (ref 98–111)
Creatinine, Ser: 1.04 mg/dL — ABNORMAL HIGH (ref 0.44–1.00)
GFR, Estimated: 51 mL/min — ABNORMAL LOW (ref >60)
Glucose, Bld: 88 mg/dL (ref 70–99)
Potassium: 4.1 mmol/L (ref 3.5–5.1)
Sodium: 142 mmol/L (ref 135–145)

## 2023-09-20 MED ORDER — APIXABAN 2.5 MG PO TABS: 2.5000 mg | ORAL_TABLET | Freq: Two times a day (BID) | ORAL | Status: AC

## 2023-09-20 MED ORDER — ENSURE ENLIVE PO LIQD
237.0000 mL | Freq: Two times a day (BID) | ORAL | Status: DC
  Administered 2023-09-20 (×2): 237 mL via ORAL

## 2023-09-20 NOTE — TOC Transition Note (Signed)
Transition of Care [facility]) - Discharge Note   Patient Details  Name: Katrina [name] MRN: [id number] Date of Birth: [birth year]  Transition of Care [facility]) CM/SW Contact:  Delilah Shan, LCSWA Phone Number: 09/20/2023, 2:58 PM   Clinical Narrative:     Patient will DC to: [facility]   Anticipated DC date: 09/20/2023  Family notified: Sadie  Transport by: Sharin Mons  ?  Per MD patient ready for DC to [facility] . RN, patient, patient's family, and facility notified of DC. Discharge Summary,FL2, and [facility] Pt/OT orders sent to facility. RN given number for report [phone] [id number] RM# [id number]. DC packet on chart. Ambulance transport requested for patient.  CSW signing off.   Final next level of care: Assisted Living Barriers to Discharge: No Barriers Identified   Patient Goals and CMS Choice Patient states their goals for this hospitalization and ongoing recovery are:: ALF   Choice offered to / list presented to : Patient      Discharge Placement              Patient chooses bed at:  ([facility]) Patient to be transferred to facility by: [facility] Name of family member notified: Sadie Patient and family notified of of transfer: 09/20/23  Discharge Plan and Services Additional resources added to the After Visit Summary for   In-house Referral: Clinical Social Work                                   Social Drivers of Health (SDOH) Interventions SDOH Screenings   Food Insecurity: Patient Unable To Answer (09/16/2023)  Housing: Patient Unable To Answer (09/16/2023)  Transportation Needs: Patient Unable To Answer (09/16/2023)  Utilities: Patient Unable To Answer (09/16/2023)  Social Connections: Patient Unable To Answer (09/16/2023)  Tobacco Use: Low Risk  (02/03/2023)     Readmission Risk Interventions     No data to display

## 2023-09-20 NOTE — NC FL2 (Signed)
[place] MEDICAID FL2 LEVEL OF CARE FORM     IDENTIFICATION  Patient Name: Katrina [name] Birthdate: 1932-08-22 Sex: female Admission Date (Current Location): 09/15/2023  [facility] and [place] Number:  Producer, television/film/video and Address:  [facility], [address] [place], [place] [address]      Provider Number: [id number]  Attending Physician Name and Address:  Hughie Closs, MD  Relative Name and Phone Number:       Current Level of Care: Hospital Recommended Level of Care: Assisted Living Facility Prior Approval Number:    Date Approved/Denied:   PASRR Number:    Discharge Plan: Other (Comment) (ALF)    Current Diagnoses: Patient Active Problem List   Diagnosis Date Noted   Second degree AV block 09/15/2023   Bradycardia 09/15/2023   Ureteral stone 10/20/2021   Cirrhosis of liver (HCC) 10/04/2021   Leukocytosis 10/04/2021   Decubitus ulcer of coccyx, stage 2 (HCC) 10/02/2021   Pyohydronephrosis 09/26/2021   Acquired thrombophilia (HCC) 02/18/2021   AKI (acute kidney injury) (HCC) 02/18/2021   Acute respiratory failure (HCC) 02/18/2021   Anemia 02/18/2021   Candidal intertrigo 02/18/2021   Cellulitis of right lower limb 02/18/2021   Chronic venous hypertension (idiopathic) with inflammation of right lower extremity 02/18/2021   Gait abnormality 02/18/2021   Physical debility 02/18/2021   Iron deficiency anemia due to chronic blood loss 02/18/2021   Localized edema 02/18/2021   Metabolic encephalopathy 02/18/2021   Pneumonia 02/18/2021   Pressure injury of sacral region, stage 3 (HCC) 02/18/2021   Problem related to unspecified psychosocial circumstances 02/18/2021   Rectal bleeding 02/18/2021   Sepsis (HCC) 02/18/2021   Urinary tract infectious disease 02/18/2021   Unspecified open wound of left buttock, initial encounter 02/18/2021   Local infection of the skin and subcutaneous tissue, unspecified 11/08/2019   Right lower quadrant  pain 11/08/2019   Paroxysmal atrial fibrillation (HCC) 01/04/2019   Disorder of the skin and subcutaneous tissue, unspecified 11/07/2017   Sleep apnea 01/10/2017   Hypertension 01/10/2017   Hypothyroidism 01/10/2017   Overactive bladder 01/10/2017   Vertigo 01/10/2017   Elevated lipids 01/10/2017   Disequilibrium 10/05/2016   Nasal congestion 08/26/2016   Morbid obesity (HCC) 10/03/2015   Encounter for general adult medical examination without abnormal findings 09/26/2015   Impaired fasting glucose 09/26/2014   Long term (current) use of anticoagulants 09/26/2014   Varicose veins of lower extremity with ulcer (HCC) 05/02/2013   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   Underimmunization status 05/04/2010   Personal history of other diseases of urinary system 01/28/2010   Thromboembolism of vein 09/09/2009   History of gastrointestinal disease 07/28/2009   Major depression, single episode 07/28/2009   Peripheral venous insufficiency 07/28/2009   Supraventricular tachycardia (HCC) 07/28/2009    Orientation RESPIRATION BLADDER Height & Weight     Self, Time, Situation, Place  Normal Incontinent, External catheter (External Urinary Catheter) Weight: 197 lb 5 oz (89.5 kg) Height:  5\' 1"  (154.9 cm)  BEHAVIORAL SYMPTOMS/MOOD NEUROLOGICAL BOWEL NUTRITION STATUS      Incontinent Diet (Cardiac)  AMBULATORY STATUS COMMUNICATION OF NEEDS Skin   Limited Assist Verbally Other (Comment) (Erythema,Buttocks,Bil.,Wound/Incision LDAs,PI buttocks,R,lower,deep tissue,Wound/Incision open or dehisced non-pressure wound buttocks,L,purple broken blanchable,Wound/Incision open or dehisced skin tear sacrum medial skin tear,wound/Ini)                       Personal Care Assistance Level of Assistance  Bathing, Feeding, Dressing Bathing  Assistance: Maximum assistance Feeding assistance: Independent Dressing Assistance: Maximum assistance     Functional Limitations Info  Sight, Hearing,  Speech Sight Info: Adequate Hearing Info: Adequate Speech Info: Adequate    SPECIAL CARE FACTORS FREQUENCY  PT (By licensed PT), OT (By licensed OT)     PT Frequency: Please see HH PT orders OT Frequency: Please see HH OT orders            Contractures Contractures Info: Not present    Additional Factors Info  Code Status, Allergies Code Status Info: FULL Allergies Info: Codeine,Penicillins,Cephalosporins           TAKE these medications     acetaminophen 325 MG tablet Commonly known as: TYLENOL Take 650 mg by mouth every 6 (six) hours as needed for mild pain.    apixaban 2.5 MG Tabs tablet Commonly known as: ELIQUIS Take 1 tablet (2.5 mg total) by mouth 2 (two) times daily. Start taking on: September 25, 2023 What changed: These instructions start on September 25, 2023. If you are unsure what to do until then, ask your doctor or other care provider.    ketoconazole 2 % cream Commonly known as: NIZORAL Apply 1 application topically daily.    levothyroxine 125 MCG tablet Commonly known as: SYNTHROID Take 125 mcg by mouth daily.    metoprolol succinate 50 MG 24 hr tablet Commonly known as: TOPROL-XL Take 50 mg by mouth daily.    polyvinyl alcohol 1.4 % ophthalmic solution Commonly known as: LIQUIFILM TEARS Place 1 drop into both eyes 4 (four) times daily as needed for dry eyes.    potassium chloride 10 MEQ CR capsule Commonly known as: MICRO-K Take 10 mEq by mouth daily.    Prenatal 28-0.8 MG Tabs Take 1 tablet by mouth daily.    PRESERVISION AREDS 2 PO Take 1 tablet by mouth daily.          Relevant Imaging Results:  Relevant Lab Results:   Additional Information [id number]  Delilah Shan, LCSWA

## 2023-09-20 NOTE — Progress Notes (Signed)
Patient discharged to ALF. No acute changes at discharge. VSS. Patient transported off unit via [facility] with personal belongings.

## 2023-09-20 NOTE — Progress Notes (Addendum)
  Patient Name: Katrina [name] Date of Encounter: 09/20/2023  Primary Cardiologist: None Electrophysiologist: None  Interval Summary   The patient is doing well today.  At this time, the patient denies chest pain, shortness of breath, or any new concerns.  Vital Signs    Vitals:   09/20/23 1000 09/20/23 1010 09/20/23 1100 09/20/23 1238  BP: (!) 142/121  105/79   Pulse: 85 83 82   Resp: (!) 26 16 14    Temp:    97.7 F (36.5 C)  TempSrc:    Oral  SpO2: 100% 97% 98%   Weight:      Height:        Intake/Output Summary (Last 24 hours) at 09/20/2023 1410 Last data filed at 09/19/2023 1800 Gross per 24 hour  Intake 61.28 ml  Output 200 ml  Net -138.72 ml   Filed Weights   09/15/23 1255 09/16/23 1320  Weight: 81.6 kg 89.5 kg    Physical Exam    GEN- The patient is well appearing, alert and oriented x 3 today.   Lungs- Clear to ausculation bilaterally, normal work of breathing Cardiac- Regular rate and rhythm, no murmurs, rubs or gallops GI- soft, NT, ND, + BS Extremities- no clubbing or cyanosis. No edema  Telemetry    VP 65-110 (personally reviewed)  Hospital Course    Katrina [name] is a 89 y.o. female  with PMH of paroxysmal AF, DVT/PE on eliquis admitted for 09/15/23 after being found down at home. Found to have CHB. On beta blocker pre-admit & therapeutic on digoxin. Femoral TVP placed. Pending PPM.   Assessment & Plan    CHB  Prior conduction disease > RBBB in 2021. Previously on Toprol 50mg  daily  -s/p PPM 09/19/23  -ok to resume prior home medications as needed Toprol  -PPM site care reviewed with patient  -she lives at [facility]  -ok from an EP standpoint for discharge  -follow up appts arranged for device    Hx Paroxysmal AF Seen on EKG 02/2020  -tele monitoring while in patient / device as outpatient   Thrombocytopenia  -per primary    Unstageable Pressure Ulcers  -per TRH    Metabolic Encephalopathy +/- Underlying Dementia  -per primary,  currently at baseline    Hx DVT / PE  -HOLD ELIQUIS post implant > ok to restart on 09/25/23  -SCD's while inpatient    Hypothyroidism  -TSH 0.59 on admit       For questions or updates, please contact [facility] Please consult [email or web address] for contact info under Cardiology/STEMI.  Signed, Canary Brim, NP-C, AGACNP-BC [facility] - Electrophysiology  09/20/2023, 2:10 PM  EP Attending  Patient seen and examined. Agree with above. The patient is doing well today after undergoing PPM yesterday for CHB. She can be discharged home from an EP perspective. PPM interrogation under my direction demonstrates normal DDD PM function. CXR demonstrates stable lead position. Usual followup.  Sharlot Gowda Adair Lemar,MD

## 2023-09-20 NOTE — Consult Note (Signed)
Ref: Garlan Fillers, MD   Subjective:  Awake. Had pacemaker placed yesterday. Progreesing on activity. VS stable.  Objective:  Vital Signs in the last 24 hours: Temp:  [97.7 F (36.5 C)-98 F (36.7 C)] 97.7 F (36.5 C) (01/28 1238) Pulse Rate:  [60-96] 82 (01/28 1100) Cardiac Rhythm: Ventricular paced (01/28 0800) Resp:  [14-26] 14 (01/28 1100) BP: (105-177)/(53-128) 105/79 (01/28 1100) SpO2:  [89 %-100 %] 98 % (01/28 1100)  Physical Exam: BP Readings from Last 1 Encounters:  09/20/23 105/79     Wt Readings from Last 1 Encounters:  09/16/23 89.5 kg    Weight change:  Body mass index is 37.28 kg/m. HEENT: [place]/AT, Eyes-Blue, Conjunctiva-Pink, Sclera-Non-icteric Neck: No JVD, No bruit, Trachea midline. Lungs:  Clear, Bilateral.Left pectoral area dressing. Cardiac:  Paced, Regular rhythm, normal S1 and S2, no S3. II/VI systolic murmur. Abdomen:  Soft, non-tender. BS present. Extremities:  No edema present. No cyanosis. No clubbing. CNS: AxOx2, Cranial nerves grossly intact, moves all 4 extremities.  Skin: Warm and dry.   Intake/Output from previous day: 01/27 0701 - 01/28 0700 In: 337.9 [I.V.:337.9] Out: 300 [Urine:300]    Lab Results: BMET    Component Value Date/Time   NA 142 09/20/2023 0325   NA 137 09/19/2023 0238   NA 142 09/18/2023 0341   NA 137 10/06/2021 0000   K 4.1 09/20/2023 0325   K 3.7 09/19/2023 0238   K 4.0 09/18/2023 0341   CL 112 (H) 09/20/2023 0325   CL 108 09/19/2023 0238   CL 113 (H) 09/18/2023 0341   CO2 19 (L) 09/20/2023 0325   CO2 19 (L) 09/19/2023 0238   CO2 17 (L) 09/18/2023 0341   GLUCOSE 88 09/20/2023 0325   GLUCOSE 100 (H) 09/19/2023 0238   GLUCOSE 117 (H) 09/18/2023 0341   BUN 18 09/20/2023 0325   BUN 19 09/19/2023 0238   BUN 18 09/18/2023 0341   BUN 18 10/06/2021 0000   CREATININE 1.04 (H) 09/20/2023 0325   CREATININE 0.95 09/19/2023 0238   CREATININE 1.04 (H) 09/18/2023 0341   CALCIUM 8.6 (L) 09/20/2023 0325    CALCIUM 8.4 (L) 09/19/2023 0238   CALCIUM 8.5 (L) 09/18/2023 0341   GFRNONAA 51 (L) 09/20/2023 0325   GFRNONAA 57 (L) 09/19/2023 0238   GFRNONAA 51 (L) 09/18/2023 0341   GFRAA >60 03/11/2020 0819   GFRAA >60 03/09/2020 1750   GFRAA >60 06/10/2016 1023   CBC    Component Value Date/Time   WBC 7.2 09/20/2023 0325   RBC 4.30 09/20/2023 0325   HGB 12.2 09/20/2023 0325   HCT 37.7 09/20/2023 0325   PLT 119 (L) 09/20/2023 0325   MCV 87.7 09/20/2023 0325   MCH 28.4 09/20/2023 0325   MCHC 32.4 09/20/2023 0325   RDW 14.3 09/20/2023 0325   LYMPHSABS 0.9 09/15/2023 1315   MONOABS 0.2 09/15/2023 1315   EOSABS 0.0 09/15/2023 1315   BASOSABS 0.0 09/15/2023 1315   HEPATIC Function Panel Recent Labs    09/15/23 1330  PROT 8.0  ALBUMIN 3.6  AST 27  ALT 17  ALKPHOS 83   HEMOGLOBIN A1C No results found for: "MPG" CARDIAC ENZYMES Lab Results  Component Value Date   CKTOTAL 242 (H) 05/10/2021   TROPONINI <0.03 06/10/2016   TROPONINI <0.03 08/27/2015   BNP No results for input(s): "PROBNP" in the last 8760 hours. TSH Recent Labs    09/15/23 1315  TSH 0.590   CHOLESTEROL Recent Labs    09/16/23 0930  CHOL 163  Scheduled Meds:  Chlorhexidine Gluconate Cloth  6 each Topical Daily   feeding supplement  237 mL Oral BID BM   hydrALAZINE  25 mg Oral Q8H   ketoconazole  1 Application Topical Daily   levothyroxine  125 mcg Oral Q0600   multivitamin  1 tablet Oral Daily   mouth rinse  15 mL Mouth Rinse 4 times per day   potassium chloride  10 mEq Oral Daily   sodium chloride flush  3 mL Intravenous Q12H   Continuous Infusions: PRN Meds:.acetaminophen **OR** acetaminophen, ondansetron (ZOFRAN) IV, mouth rinse, polyethylene glycol, polyvinyl alcohol  Assessment/Plan: 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 Chronic dementia HTN Hypothyroidism Type 2 DM H/O DVT H/O Pulmonary embolism H/O atrial fibrillation  Plan: Increase activity as directed. F/U in 1 month or as needed     LOS: 5 days   Time spent including chart review, lab review, examination, discussion with patient/Nurse : 30 min   Orpah Cobb  MD  09/20/2023, 1:09 PM

## 2023-09-20 NOTE — Evaluation (Signed)
Occupational Therapy Evaluation Patient Details Name: Katrina [name] MRN: [id number] DOB: [birth year] Today's Date: 09/20/2023   History of Present Illness 89 y.o. female presents to [facility] hospital on 09/15/2023 after being found down at home. Pt found to have CHB. Pacemaker placement on 09/19/2023. PMH includes PAF, DVT/PE, HTN.   Clinical Impression   PTA, pt from ALF where she reports she mobilized with rollator and performed ADL independently although staff has always been able to assist her as needed. Pt wears slide on shoes at baseline. Pt needing up to mod A for UB and LB ADL on eval secondary to pacemaker precautions. Pt generally aware of precautions on arrival and plans to ask for additional assist with return to ALF. Pending ALF ability to provide assist during ADL on return, recommend discharge home with HHOT.        If plan is discharge home, recommend the following: A little help with walking and/or transfers;A lot of help with bathing/dressing/bathroom;Assistance with cooking/housework;Direct supervision/assist for medications management;Direct supervision/assist for financial management;Assist for transportation;Help with stairs or ramp for entrance    Functional Status Assessment  Patient has had a recent decline in their functional status and demonstrates the ability to make significant improvements in function in a reasonable and predictable amount of time.  Equipment Recommendations  None recommended by OT    Recommendations for Other Services       Precautions / Restrictions Precautions Precautions: Fall;ICD/Pacemaker Required Braces or Orthoses: Sling Restrictions Weight Bearing Restrictions Per Provider Order: Yes Other Position/Activity Restrictions: Pacemaker precautions, limit pushing/pulling through LUE      Mobility Bed Mobility               General bed mobility comments: in recliner chair    Transfers Overall transfer level: Needs  assistance Equipment used: 1 person hand held assist Transfers: Sit to/from Stand Sit to Stand: Contact guard assist, From elevated surface           General transfer comment: hand hold assist to simulate cane use      Balance Overall balance assessment: Needs assistance Sitting-balance support: No upper extremity supported, Feet supported Sitting balance-Leahy Scale: Good     Standing balance support: Single extremity supported, Reliant on assistive device for balance Standing balance-Leahy Scale: Poor                             ADL either performed or assessed with clinical judgement   ADL Overall ADL's : Needs assistance/impaired Eating/Feeding: Set up   Grooming: Set up   Upper Body Bathing: Moderate assistance   Lower Body Bathing: Moderate assistance   Upper Body Dressing : Moderate assistance Upper Body Dressing Details (indicate cue type and reason): more of max A for sling Lower Body Dressing: Moderate assistance   Toilet Transfer: Contact guard Marine scientist Details (indicate cue type and reason): 1 person [facility] Toileting- Clothing Manipulation and Hygiene: Moderate assistance;Sit to/from stand       Functional mobility during ADLs: Contact guard assist (1 person HHA)       Vision Patient Visual Report: No change from baseline Vision Assessment?: No apparent visual deficits     Perception Perception: Not tested       Praxis Praxis: Not tested       Pertinent Vitals/Pain Pain Assessment Pain Assessment: Faces Faces Pain Scale: Hurts little more Pain Location: pacemaker site Pain Descriptors / Indicators: Sore Pain Intervention(s): Limited activity within patient's  tolerance, Monitored during session     Extremity/Trunk Assessment Upper Extremity Assessment Upper Extremity Assessment: LUE deficits/detail LUE Deficits / Details: pt with pacemaker precautions, defer shoulder ROM assessment and formal MMT.  elbow/wrist/digits [facility]   Lower Extremity Assessment Lower Extremity Assessment: Defer to PT evaluation   Cervical / Trunk Assessment Cervical / Trunk Assessment: Kyphotic   Communication Communication Communication: No apparent difficulties Cueing Techniques: Verbal cues   Cognition Arousal: Alert Behavior During Therapy: WFL for tasks assessed/performed Overall Cognitive Status: Impaired/Different from baseline Area of Impairment: Memory, Awareness                     Memory: Decreased short-term memory, Decreased recall of precautions     Awareness: Emergent   General Comments: min cues to stay on topic at times during new learning. fair recall of pacemaker precautions provided by PT with pt able to report she is not supposed to move LUE for the next several days and when handout is provided able to recall with use of handout how much she is able to move arm daily. REviewed compensatory techniques for ADL and pt reporting she believes it is safest to ask staff for help while precautions are in place and OT agrees.     General Comments  VSS on RA    Exercises     Shoulder Instructions      Home Living Family/patient expects to be discharged to:: Assisted living                             Home Equipment: Rollator (4 wheels);Cane - single point   Additional Comments: pt lives in ALF at [facility]      Prior Functioning/Environment Prior Level of Function : Needs assist             Mobility Comments: ambulates with SPC in her apartment, utilizes a rollator for longer distances outside of the apartment ADLs Comments: Pt reports staff provides meals and medication management. Pt is typically able to bathe and dress self but staff are available as needed to assist        OT Problem List: Decreased strength;Impaired balance (sitting and/or standing);Decreased activity tolerance;Decreased cognition;Decreased safety awareness;Decreased knowledge  of precautions;Decreased knowledge of use of DME or AE      OT Treatment/Interventions: Self-care/ADL training;Therapeutic exercise;DME and/or AE instruction;Balance training;Patient/family education;Therapeutic activities    OT Goals(Current goals can be found in the care plan section) Acute Rehab OT Goals Patient Stated Goal: go home OT Goal Formulation: With patient Time For Goal Achievement: 10/04/23 Potential to Achieve Goals: Good  OT Frequency: Min 1X/week    Co-evaluation              AM-PAC OT "6 Clicks" Daily Activity     Outcome Measure Help from another person eating meals?: None Help from another person taking care of personal grooming?: A Little Help from another person toileting, which includes using toliet, bedpan, or urinal?: A Lot Help from another person bathing (including washing, rinsing, drying)?: A Lot Help from another person to put on and taking off regular upper body clothing?: A Little Help from another person to put on and taking off regular lower body clothing?: A Lot 6 Click Score: 16   End of Session Equipment Utilized During Treatment: Other (comment) (L arm sling) Nurse Communication: Mobility status;Other (comment) (ok to go home to ALF given pt has adequate support)  Activity Tolerance:  Patient tolerated treatment well Patient left: in chair;with call bell/phone within reach;with chair alarm set  OT Visit Diagnosis: Unsteadiness on feet (R26.81);Muscle weakness (generalized) (M62.81)                Time: 4098-1191 OT Time Calculation (min): 30 min Charges:  OT General Charges $OT Visit: 1 Visit OT Evaluation $OT Eval Moderate Complexity: 1 Mod OT Treatments $Self Care/Home Management : 8-22 mins  Tyler Deis, OTR/L [facility] Acute Rehabilitation Office: [phone]   Myrla Halsted 09/20/2023, 9:53 AM

## 2023-09-20 NOTE — Evaluation (Addendum)
Physical Therapy Evaluation Patient Details Name: Katrina [name] MRN: [id number] DOB: 06-06-1932 Today's Date: 09/20/2023  History of Present Illness  89 y.o. female presents to [facility] hospital on 09/15/2023 after being found down at home. Pt found to have CHB. Pacemaker placement on 09/19/2023. PMH includes PAF, DVT/PE, HTN.  Clinical Impression  Pt presents to PT with deficits in functional mobility, gait, balance, strength, power, memory. Pt is able to stand and ambulate for short distances at this time, likely weaker due to recent immobility during this hospitalization. PT provides education on pacemaker precautions and use of sling to provide a reminder to limit ROM of her LUE. PT will continue to follow in an effort to improve strength and endurance, as well as to reinforce precautions. PT recommends a return to [facility] with intermittent assistance from staff and HHPT. Pt is able to utilize a walker with LUE, however she should avoid lifting or significant weightbearing through the LUE.      If plan is discharge home, recommend the following: A lot of help with bathing/dressing/bathroom;Assistance with cooking/housework;Direct supervision/assist for medications management;Direct supervision/assist for financial management;Supervision due to cognitive status;A little help with walking and/or transfers   Can travel by private vehicle        Equipment Recommendations None recommended by PT  Recommendations for Other Services       Functional Status Assessment Patient has had a recent decline in their functional status and demonstrates the ability to make significant improvements in function in a reasonable and predictable amount of time.     Precautions / Restrictions Precautions Precautions: Fall;ICD/Pacemaker Required Braces or Orthoses: Sling Restrictions Weight Bearing Restrictions Per Provider Order: Yes Other Position/Activity Restrictions: Pacemaker precautions, limit  pushing/pulling through LUE      Mobility  Bed Mobility Overal bed mobility: Needs Assistance Bed Mobility: Supine to Sit     Supine to sit: Min assist, HOB elevated          Transfers Overall transfer level: Needs assistance Equipment used: 1 person hand held assist Transfers: Sit to/from Stand Sit to Stand: Contact guard assist, From elevated surface           General transfer comment: hand hold assist to simulate cane use from tall ICU bed    Ambulation/Gait Ambulation/Gait assistance: Contact guard assist Gait Distance (Feet): 20 Feet Assistive device: 1 person hand held assist Gait Pattern/deviations: Step-to pattern Gait velocity: reduced Gait velocity interpretation: <1.31 ft/sec, indicative of household ambulator   General Gait Details: slowed step-to gait, reduced step length bilaterally  Stairs            Wheelchair Mobility     Tilt Bed    Modified Rankin (Stroke Patients Only)       Balance Overall balance assessment: Needs assistance Sitting-balance support: No upper extremity supported, Feet supported Sitting balance-Leahy Scale: Good     Standing balance support: Single extremity supported, Reliant on assistive device for balance Standing balance-Leahy Scale: Poor                               Pertinent Vitals/Pain Pain Assessment Pain Assessment: Faces Faces Pain Scale: Hurts little more Pain Location: pacemaker site Pain Descriptors / Indicators: Sore Pain Intervention(s): Monitored during session    Home Living Family/patient expects to be discharged to:: Assisted living                 Home Equipment: Rollator (4 wheels);Cane -  single point Additional Comments: pt lives in ALF at [facility]    Prior Function Prior Level of Function : Needs assist             Mobility Comments: ambulates with SPC in her apartment, utilizes a rollator for longer distances outside of the apartment        Extremity/Trunk Assessment   Upper Extremity Assessment Upper Extremity Assessment: LUE deficits/detail LUE Deficits / Details: pt with pacemaker precautions, defer shoulder ROM assessment and formal MMT. elbow/wrist/digits [facility]    Lower Extremity Assessment Lower Extremity Assessment: Generalized weakness    Cervical / Trunk Assessment Cervical / Trunk Assessment: Kyphotic  Communication   Communication Communication: No apparent difficulties Cueing Techniques: Verbal cues  Cognition Arousal: Alert Behavior During Therapy: WFL for tasks assessed/performed Overall Cognitive Status: Impaired/Different from baseline Area of Impairment: Memory, Awareness                     Memory: Decreased short-term memory, Decreased recall of precautions     Awareness: Emergent            General Comments General comments (skin integrity, edema, etc.): VSS on RA, dressing is clean and dry    Exercises     Assessment/Plan    PT Assessment Patient needs continued PT services  PT Problem List Decreased strength;Decreased activity tolerance;Decreased balance;Decreased mobility;Decreased cognition;Decreased knowledge of use of DME;Decreased knowledge of precautions;Cardiopulmonary status limiting activity       PT Treatment Interventions DME instruction;Gait training;Functional mobility training;Therapeutic activities;Therapeutic exercise;Balance training;Neuromuscular re-education;Cognitive remediation;Patient/family education    PT Goals (Current goals can be found in the Care Plan section)  Acute Rehab PT Goals Patient Stated Goal: to return to [facility] PT Goal Formulation: With patient Time For Goal Achievement: 10/04/23 Potential to Achieve Goals: Good    Frequency Min 1X/week     Co-evaluation               AM-PAC PT "6 Clicks" Mobility  Outcome Measure Help needed turning from your back to your side while in a flat bed without using bedrails?: A  Little Help needed moving from lying on your back to sitting on the side of a flat bed without using bedrails?: A Little Help needed moving to and from a bed to a chair (including a wheelchair)?: A Little Help needed standing up from a chair using your arms (e.g., wheelchair or bedside chair)?: A Little Help needed to walk in hospital room?: A Little Help needed climbing 3-5 steps with a railing? : Total 6 Click Score: 16    End of Session Equipment Utilized During Treatment: Gait belt Activity Tolerance: Patient tolerated treatment well Patient left: in chair;with call bell/phone within reach;with chair alarm set Nurse Communication: Mobility status PT Visit Diagnosis: Other abnormalities of gait and mobility (R26.89);Muscle weakness (generalized) (M62.81)    Time: 0981-1914 PT Time Calculation (min) (ACUTE ONLY): 20 min   Charges:   PT Evaluation $PT Eval Low Complexity: 1 Low   PT General Charges $$ ACUTE PT VISIT: 1 Visit         Arlyss Gandy, PT, DPT Acute Rehabilitation Office [phone]   Arlyss Gandy 09/20/2023, 9:20 AM

## 2023-09-20 NOTE — Discharge Summary (Addendum)
Physician Discharge Summary  Katrina [name] YNW:[id number] DOB: July 21, 1932 DOA: 09/15/2023  PCP: Garlan Fillers, MD  Admit date: 09/15/2023 Discharge date: 09/20/2023 30 Day Unplanned Readmission Risk Score    Flowsheet Row ED to Hosp-Admission (Current) from 09/15/2023 in [place] 2H CARDIOVASCULAR ICU  30 Day Unplanned Readmission Risk Score (%) 14.44 Filed at 09/20/2023 1200       This score is the patient's risk of an unplanned readmission within 30 days of being discharged (0 -100%). The score is based on dignosis, age, lab data, medications, orders, and past utilization.   Low:  0-14.9   Medium: 15-21.9   High: 22-29.9   Extreme: 30 and above          [facility] From[facility]  Recommendations for Outpatient Follow-up:  Follow up with PCP in 1-2 weeks Please obtain BMP/CBC in one week Follow-up with primary cardiologist Dr. Algie Coffer as he has a scheduled Please follow up with your PCP on the following pending results: Unresulted Labs (From admission, onward)     Start     Ordered   09/19/23 0846  Surgical PCR screen  (Screening)  Once,   R        09/19/23 0845   09/15/23 1301  Urinalysis, w/ Reflex to Culture (Infection Suspected) -Urine, Clean Catch  Once,   URGENT       Question:  Specimen Source  Answer:  Urine, Clean Catch   09/15/23 1301              Home Health: Yes.  Speech evaluation as needed Equipment/Devices: None  Discharge Condition: Stable CODE STATUS: Full code Diet recommendation: Cardiac  Subjective: Seen and examined.  No complaints.  Excited to go back to assisted living facility.  Brief/Interim Summary: Patient is an 89 year old female, past medical history significant for PAF on Eliquis, h/o DVT and PE, history of renal stones, and hypertension.  There are concerns for likely underlying dementia.  Patient presented with headache and lethargy and staff endorsed  some vomiting at the facility.  No other complaints.  Workup in the ED included CT head which was unremarkable however telemetry showed secondary AV block.  Patient was on metoprolol PTA.  Admitted under hospital service, seen by primary cardiologist and due to symptomatic bradycardia and eventual diagnosis of complete heart block, EP was consulted.  Further details below.   History of PAF on Eliquis admitted with complete heart block:  -Patient presented with altered mental status and headache -Dig level 1.0, TSH and electrolytes within normal range -Cardiology consulted, patient received multiple doses of atropine -Digoxin and metoprolol discontinued. -Electrophysiology team is directing care.  Patient underwent PPM placement on 09/19/2023.  Seen by cardiology and EP, both have cleared the patient for discharge with instructions to resume PTA metoprolol right away and to resume Eliquis on 09/25/2023.   Hypothyroidism: -Continue Synthroid   Encephalopathy/possible dementing illness: Patient said to be at baseline.  She is fully alert and oriented today.  CKD stage IIIa: At baseline.  Discharge plan was discussed with patient and/or family member and they verbalized understanding and agreed with it.  Discharge Diagnoses:  Principal Problem:   Bradycardia Active Problems:   Supraventricular tachycardia (HCC)   Second degree AV block    Discharge Instructions   Allergies as of 09/20/2023       Reactions   Codeine    Other reaction(s): nausea   Penicillins  Mother and brother have allergy, patient just does not take this Did PCN reaction causing immediate rash, facial/tongue/throat swelling, SOB or lightheadedness with hypotension: unknown Did PCN reaction causing severe rash involving mucus membranes or skin necrosis:unknown Did PCN reaction that required hospitalization : unknown Did PCN reaction occurring within the last 10 years: unknown Pt never actually took med. Did okay  with ampicillin   Cephalosporins Rash   Dermatitis to keflex in 2012- Has tolerated several courses since that time-  Most recently 2021. Notably patient has stasis dermatitis that developed around the same time.         Medication List     TAKE these medications    acetaminophen 325 MG tablet Commonly known as: TYLENOL Take 650 mg by mouth every 6 (six) hours as needed for mild pain.   apixaban 2.5 MG Tabs tablet Commonly known as: ELIQUIS Take 1 tablet (2.5 mg total) by mouth 2 (two) times daily. Start taking on: September 25, 2023 What changed: These instructions start on September 25, 2023. If you are unsure what to do until then, ask your doctor or other care provider.   ketoconazole 2 % cream Commonly known as: NIZORAL Apply 1 application topically daily.   levothyroxine 125 MCG tablet Commonly known as: SYNTHROID Take 125 mcg by mouth daily.   metoprolol succinate 50 MG 24 hr tablet Commonly known as: TOPROL-XL Take 50 mg by mouth daily.   polyvinyl alcohol 1.4 % ophthalmic solution Commonly known as: LIQUIFILM TEARS Place 1 drop into both eyes 4 (four) times daily as needed for dry eyes.   potassium chloride 10 MEQ CR capsule Commonly known as: MICRO-K Take 10 mEq by mouth daily.   Prenatal 28-0.8 MG Tabs Take 1 tablet by mouth daily.   PRESERVISION AREDS 2 PO Take 1 tablet by mouth daily.        Follow-up Information     Orpah Cobb, MD. Schedule an appointment as soon as possible for a visit in 1 month(s).   Specialty: Cardiology Contact information: [address] [place] [address] [phone]         Mast, Man X, NP Follow up in 1 week(s).   Specialty: Internal Medicine Contact information: [address] [place] [address] [phone]         Garlan Fillers, MD Follow up in 1 week(s).   Specialty: Internal Medicine Contact information: [address] [place] [address] [phone]                 Allergies  Allergen Reactions   Codeine     Other reaction(s): nausea   Penicillins     Mother and brother have allergy, patient just does not take this Did PCN reaction causing immediate rash, facial/tongue/throat swelling, SOB or lightheadedness with hypotension: unknown Did PCN reaction causing severe rash involving mucus membranes or skin necrosis:unknown Did PCN reaction that required hospitalization : unknown Did PCN reaction occurring within the last 10 years: unknown Pt never actually took med. Did okay with ampicillin   Cephalosporins Rash    Dermatitis to keflex in 2012- Has tolerated several courses since that time-  Most recently 2021. Notably patient has stasis dermatitis that developed around the same time.     Consultations: Cardiology and EP   Procedures/Studies: DG Chest Port 1 View Result Date: 09/20/2023 CLINICAL DATA:  Pacemaker placement. EXAM: PORTABLE CHEST 1 VIEW COMPARISON:  Radiographs 09/15/2023 and 09/26/2021. FINDINGS: 0539 hours. New left subclavian pacemaker leads project over  the right atrium and right ventricle. The heart size and mediastinal contours are stable. The lungs are clear. There is no pleural effusion or pneumothorax. No acute osseous findings are evident. Telemetry leads overlie the chest. IMPRESSION: New left subclavian pacemaker leads in place. No evidence of pneumothorax or other acute complication. Electronically Signed   By: Carey Bullocks M.D.   On: 09/20/2023 09:50   EP PPM/ICD IMPLANT Result Date: 09/19/2023 CONCLUSIONS:  1. Successful implantation of a [employer] dual-chamber pacemaker for symptomatic bradycardia due to CHB  2. No early apparent complications.       Lewayne Bunting, MD 09/19/2023 6:46 PM   CARDIAC CATHETERIZATION Result Date: 09/16/2023   Anticipated discharge date to be determined.   No indication for antiplatelet therapy at this time . EP consulted to consider permanent pacemaker if AV block do not recover.Marland Kitchen    ECHOCARDIOGRAM COMPLETE Result Date: 09/16/2023    ECHOCARDIOGRAM REPORT   Patient Name:   Katrina [name] Dib Date of Exam: 09/16/2023 Medical Rec #:  [id number]   Height:       61.0 in Accession #:    [id number]  Weight:       180.0 lb Date of Birth:  1932/05/20   BSA:          1.806 m Patient Age:    89 years    BP:           146/108 mmHg Patient Gender: F           HR:           31 bpm. Exam Location:  Inpatient Procedure: 2D Echo, Cardiac Doppler and Color Doppler Indications:     Atrial Fibrillation I48.91, Abnormal ECG R94.31  History:         Patient has no prior history of Echocardiogram examinations.                  Arrythmias:Atrial Fibrillation and Tachycardia; Risk                  Factors:Hypertension and Sleep Apnea.  Sonographer:     Lucendia Herrlich RCS Referring Phys:  [id number] [facility] GOEL Diagnosing Phys: Orpah Cobb MD IMPRESSIONS  1. Left ventricular ejection fraction, by estimation, is 55 to 60%. The left ventricle has normal function. The left ventricle has no regional wall motion abnormalities. There is mild concentric left ventricular hypertrophy. Left ventricular diastolic parameters are consistent with Grade I diastolic dysfunction (impaired relaxation).  2. Right ventricular systolic function is normal. The right ventricular size is normal.  3. Left atrial size was mildly dilated.  4. The mitral valve is degenerative. Mild mitral valve regurgitation. Moderate mitral annular calcification.  5. The aortic valve is tricuspid. There is moderate calcification of the aortic valve. There is mild thickening of the aortic valve. Aortic valve regurgitation is not visualized. Mild aortic valve stenosis.  6. There is mild (Grade II) atheroma plaque involving the aortic root and ascending aorta.  7. The inferior vena cava is normal in size with <50% respiratory variability, suggesting right atrial pressure of 8 mmHg. FINDINGS  Left Ventricle: Left ventricular ejection fraction, by estimation, is 55 to 60%.  The left ventricle has normal function. The left ventricle has no regional wall motion abnormalities. The left ventricular internal cavity size was normal in size. There is  mild concentric left ventricular hypertrophy. Left ventricular diastolic parameters are consistent with Grade I diastolic dysfunction (impaired relaxation). Right Ventricle: The right ventricular size is  normal. No increase in right ventricular wall thickness. Right ventricular systolic function is normal. Left Atrium: Left atrial size was mildly dilated. Right Atrium: Right atrial size was normal in size. Pericardium: There is no evidence of pericardial effusion. Mitral Valve: The mitral valve is degenerative in appearance. Moderate mitral annular calcification. Mild mitral valve regurgitation. Tricuspid Valve: The tricuspid valve is normal in structure. Tricuspid valve regurgitation is mild. Aortic Valve: The aortic valve is tricuspid. There is moderate calcification of the aortic valve. There is mild thickening of the aortic valve. There is mild aortic valve annular calcification. Aortic valve regurgitation is not visualized. Mild aortic stenosis is present. Aortic valve mean gradient measures 9.0 mmHg. Aortic valve peak gradient measures 19.3 mmHg. Aortic valve area, by VTI measures 1.54 cm. Pulmonic Valve: The pulmonic valve was normal in structure. Pulmonic valve regurgitation is not visualized. Aorta: The aortic root is normal in size and structure. There is mild (Grade II) atheroma plaque involving the aortic root and ascending aorta. Venous: The inferior vena cava is normal in size with less than 50% respiratory variability, suggesting right atrial pressure of 8 mmHg. IAS/Shunts: The atrial septum is grossly normal.  LEFT VENTRICLE PLAX 2D LVIDd:         4.70 cm   Diastology LVIDs:         3.40 cm   LV e' medial:    4.87 cm/s LV PW:         1.20 cm   LV E/e' medial:  25.5 LV IVS:        0.50 cm   LV e' lateral:   5.92 cm/s LVOT diam:      1.90 cm   LV E/e' lateral: 20.9 LV SV:         94 LV SV Index:   52 LVOT Area:     2.84 cm  RIGHT VENTRICLE             IVC RV S prime:     10.50 cm/s  IVC diam: 1.80 cm TAPSE (M-mode): 2.3 cm LEFT ATRIUM             Index        RIGHT ATRIUM           Index LA diam:        4.30 cm 2.38 cm/m   RA Area:     15.00 cm LA Vol (A2C):   51.0 ml 28.24 ml/m  RA Volume:   38.20 ml  21.15 ml/m LA Vol (A4C):   36.9 ml 20.43 ml/m LA Biplane Vol: 43.4 ml 24.03 ml/m  AORTIC VALVE AV Area (Vmax):    1.49 cm AV Area (Vmean):   1.59 cm AV Area (VTI):     1.54 cm AV Vmax:           219.50 cm/s AV Vmean:          137.500 cm/s AV VTI:            0.610 m AV Peak Grad:      19.3 mmHg AV Mean Grad:      9.0 mmHg LVOT Vmax:         115.00 cm/s LVOT Vmean:        77.050 cm/s LVOT VTI:          0.331 m LVOT/AV VTI ratio: 0.54  AORTA Ao Root diam: 2.80 cm Ao Asc diam:  3.40 cm MITRAL VALVE  TRICUSPID VALVE MV Area (PHT): 2.36 cm     TV Peak grad:   16.8 mmHg MV Decel Time: 322 msec     TV Vmax:        2.05 m/s MV E velocity: 124.00 cm/s  TR Peak grad:   19.2 mmHg MV A velocity: 136.00 cm/s  TR Vmax:        219.00 cm/s MV E/A ratio:  0.91                             SHUNTS                             Systemic VTI:  0.33 m                             Systemic Diam: 1.90 cm Orpah Cobb MD Electronically signed by Orpah Cobb MD Signature Date/Time: 09/16/2023/1:19:22 PM    Final    CT HEAD WO CONTRAST ( ) Result Date: 09/15/2023 CLINICAL DATA:  Altered mental status. EXAM: CT HEAD WITHOUT CONTRAST TECHNIQUE: Contiguous axial images were obtained from the base of the skull through the vertex without intravenous contrast. RADIATION DOSE REDUCTION: This exam was performed according to the departmental dose-optimization program which includes automated exposure control, adjustment of the mA and/or kV according to patient size and/or use of iterative reconstruction technique. COMPARISON:  CT head dated May 10, 2021. FINDINGS: Brain: No evidence of acute infarction, hemorrhage, hydrocephalus, extra-axial collection or mass lesion/mass effect. Stable atrophy and moderate chronic microvascular ischemic changes. Vascular: Atherosclerotic vascular calcification of the carotid siphons. No hyperdense vessel. Skull: Normal. Negative for fracture or focal lesion. Sinuses/Orbits: Complete opacification the right maxillary sinus. Other: None. IMPRESSION: 1. No acute intracranial abnormality. 2. Stable atrophy and moderate chronic microvascular ischemic changes. 3. Right maxillary sinusitis. Electronically Signed   By: Obie Dredge M.D.   On: 09/15/2023 15:51   DG Chest Portable 1 View Result Date: 09/15/2023 CLINICAL DATA:  Altered mental status.  Chills. EXAM: PORTABLE CHEST 1 VIEW COMPARISON:  09/26/2021. FINDINGS: Low lung volume. Apparent increased interstitial markings are likely secondary to low lung volume. No frank pulmonary edema. Bilateral lung fields are otherwise clear. There is apparent blunting of bilateral lateral costophrenic angles, which may be due to overlying soft tissue versus subtle pleural effusion/pleural thickening. Consider lateral radiograph versus ultrasound, if differentiation is desired. Stable cardio-mediastinal silhouette. No acute osseous abnormalities. The soft tissues are within normal limits. IMPRESSION: *Low lung volume. Apparent blunting of bilateral lateral costophrenic angles, as discussed above. Electronically Signed   By: Jules Schick M.D.   On: 09/15/2023 14:20     Discharge Exam: Vitals:   09/20/23 1300 09/20/23 1400  BP: (!) 157/73   Pulse: 88 76  Resp: (!) 21 20  Temp:    SpO2: 97% 96%   Vitals:   09/20/23 1200 09/20/23 1238 09/20/23 1300 09/20/23 1400  BP: 118/64  (!) 157/73   Pulse: 84 89 88 76  Resp: (!) 21 (!) 21 (!) 21 20  Temp:  97.7 F (36.5 C)    TempSrc:  Oral    SpO2: 97% 98% 97% 96%  Weight:      Height:        General: Pt is alert, awake, not  in acute distress Cardiovascular: RRR, S1/S2 +, no rubs, no gallops Respiratory:  CTA bilaterally, no wheezing, no rhonchi Abdominal: Soft, NT, ND, bowel sounds + Extremities: no edema, no cyanosis    The results of significant diagnostics from this hospitalization (including imaging, microbiology, ancillary and laboratory) are listed below for reference.     Microbiology: Recent Results (from the past 240 hours)  Resp panel by RT-PCR (RSV, Flu A&B, Covid) Anterior Nasal Swab     Status: None   Collection Time: 09/15/23  1:00 PM   Specimen: Anterior Nasal Swab  Result Value Ref Range Status   SARS Coronavirus 2 by RT PCR NEGATIVE NEGATIVE Final   Influenza A by PCR NEGATIVE NEGATIVE Final   Influenza B by PCR NEGATIVE NEGATIVE Final    Comment: (NOTE) The Xpert Xpress SARS-CoV-2/FLU/RSV plus assay is intended as an aid in the diagnosis of influenza from Nasopharyngeal swab specimens and should not be used as a sole basis for treatment. Nasal washings and aspirates are unacceptable for Xpert Xpress SARS-CoV-2/FLU/RSV testing.  Fact Sheet for Patients: BloggerCourse.com  Fact Sheet for Healthcare Providers: SeriousBroker.it  This test is not yet approved or cleared by the [place] FDA and has been authorized for detection and/or diagnosis of SARS-CoV-2 by FDA under an Emergency Use Authorization (EUA). This EUA will remain in effect (meaning this test can be used) for the duration of the COVID-19 declaration under Section 564(b)(1) of the Act, 21 U.S.C. section 360bbb-3(b)(1), unless the authorization is terminated or revoked.     Resp Syncytial Virus by PCR NEGATIVE NEGATIVE Final    Comment: (NOTE) Fact Sheet for Patients: BloggerCourse.com  Fact Sheet for Healthcare Providers: SeriousBroker.it  This test is not yet approved or cleared by the [place] FDA  and has been authorized for detection and/or diagnosis of SARS-CoV-2 by FDA under an Emergency Use Authorization (EUA). This EUA will remain in effect (meaning this test can be used) for the duration of the COVID-19 declaration under Section 564(b)(1) of the Act, 21 U.S.C. section 360bbb-3(b)(1), unless the authorization is terminated or revoked.  Performed at [facility], [address], [place], [place] [address]   MRSA Next Gen by PCR, Nasal     Status: None   Collection Time: 09/16/23  1:30 PM   Specimen: Nasal Mucosa; Nasal Swab  Result Value Ref Range Status   MRSA by PCR Next Gen NOT DETECTED NOT DETECTED Final    Comment: (NOTE) The GeneXpert MRSA Assay (FDA approved for NASAL specimens only), is one component of a comprehensive MRSA colonization surveillance program. It is not intended to diagnose MRSA infection nor to guide or monitor treatment for MRSA infections. Test performance is not FDA approved in patients less than 39 years old. Performed at [facility], [address] [place], [place] [address]      Labs: BNP (last 3 results) No results for input(s): "BNP" in the last 8760 hours. Basic Metabolic Panel: Recent Labs  Lab 09/16/23 0034 09/16/23 0533 09/17/23 0251 09/18/23 0341 09/19/23 0238 09/20/23 0325  NA  --  139 140 142 137 142  K  --  3.8 3.9 4.0 3.7 4.1  CL  --  107 110 113* 108 112*  CO2  --  21* 20* 17* 19* 19*  GLUCOSE  --  86 84 117* 100* 88  BUN  --  25* 22 18 19 18   CREATININE  --  1.15* 1.00 1.04* 0.95 1.04*  CALCIUM  --  8.9 8.7* 8.5* 8.4* 8.6*  MG 2.1  --  2.0  --   --   --  PHOS  --   --  2.8  --   --   --    Liver Function Tests: Recent Labs  Lab 09/15/23 1330  AST 27  ALT 17  ALKPHOS 83  BILITOT 0.8  PROT 8.0  ALBUMIN 3.6   Recent Labs  Lab 09/15/23 1330  LIPASE 40   Recent Labs  Lab 09/15/23 1515  AMMONIA <10   CBC: Recent Labs  Lab 09/15/23 1315 09/16/23 0533 09/17/23 0251 09/18/23 0341  09/19/23 0238 09/20/23 0325  WBC 8.4 6.9 7.0 6.5 6.3 7.2  NEUTROABS 7.2  --   --   --   --   --   HGB 12.9 12.7 12.1 12.9 12.1 12.2  HCT 38.8 39.1 37.2 38.8 36.8 37.7  MCV 89.0 89.7 87.9 87.2 88.2 87.7  PLT 185 135* 134* 144* 109* 119*   Cardiac Enzymes: No results for input(s): "CKTOTAL", "CKMB", "CKMBINDEX", "TROPONINI" in the last 168 hours. BNP: Invalid input(s): "POCBNP" CBG: No results for input(s): "GLUCAP" in the last 168 hours. D-Dimer No results for input(s): "DDIMER" in the last 72 hours. Hgb A1c No results for input(s): "HGBA1C" in the last 72 hours. Lipid Profile No results for input(s): "CHOL", "HDL", "LDLCALC", "TRIG", "CHOLHDL", "LDLDIRECT" in the last 72 hours. Thyroid function studies No results for input(s): "TSH", "T4TOTAL", "T3FREE", "THYROIDAB" in the last 72 hours.  Invalid input(s): "FREET3" Anemia work up No results for input(s): "VITAMINB12", "FOLATE", "FERRITIN", "TIBC", "IRON", "RETICCTPCT" in the last 72 hours. Urinalysis    Component Value Date/Time   COLORURINE AMBER (A) 09/16/2023 1028   APPEARANCEUR HAZY (A) 09/16/2023 1028   LABSPEC 1.024 09/16/2023 1028   PHURINE 5.0 09/16/2023 1028   GLUCOSEU NEGATIVE 09/16/2023 1028   HGBUR NEGATIVE 09/16/2023 1028   BILIRUBINUR NEGATIVE 09/16/2023 1028   KETONESUR NEGATIVE 09/16/2023 1028   PROTEINUR 30 (A) 09/16/2023 1028   UROBILINOGEN 1.0 03/18/2008 0642   NITRITE NEGATIVE 09/16/2023 1028   LEUKOCYTESUR NEGATIVE 09/16/2023 1028   Sepsis Labs Recent Labs  Lab 09/17/23 0251 09/18/23 0341 09/19/23 0238 09/20/23 0325  WBC 7.0 6.5 6.3 7.2   Microbiology Recent Results (from the past 240 hours)  Resp panel by RT-PCR (RSV, Flu A&B, Covid) Anterior Nasal Swab     Status: None   Collection Time: 09/15/23  1:00 PM   Specimen: Anterior Nasal Swab  Result Value Ref Range Status   SARS Coronavirus 2 by RT PCR NEGATIVE NEGATIVE Final   Influenza A by PCR NEGATIVE NEGATIVE Final   Influenza B by  PCR NEGATIVE NEGATIVE Final    Comment: (NOTE) The Xpert Xpress SARS-CoV-2/FLU/RSV plus assay is intended as an aid in the diagnosis of influenza from Nasopharyngeal swab specimens and should not be used as a sole basis for treatment. Nasal washings and aspirates are unacceptable for Xpert Xpress SARS-CoV-2/FLU/RSV testing.  Fact Sheet for Patients: BloggerCourse.com  Fact Sheet for Healthcare Providers: SeriousBroker.it  This test is not yet approved or cleared by the [place] FDA and has been authorized for detection and/or diagnosis of SARS-CoV-2 by FDA under an Emergency Use Authorization (EUA). This EUA will remain in effect (meaning this test can be used) for the duration of the COVID-19 declaration under Section 564(b)(1) of the Act, 21 U.S.C. section 360bbb-3(b)(1), unless the authorization is terminated or revoked.     Resp Syncytial Virus by PCR NEGATIVE NEGATIVE Final    Comment: (NOTE) Fact Sheet for Patients: BloggerCourse.com  Fact Sheet for Healthcare Providers: SeriousBroker.it  This test is not  yet approved or cleared by the [place] and has been authorized for detection and/or diagnosis of SARS-CoV-2 by FDA under an Emergency Use Authorization (EUA). This EUA will remain in effect (meaning this test can be used) for the duration of the COVID-19 declaration under Section 564(b)(1) of the Act, 21 U.S.C. section 360bbb-3(b)(1), unless the authorization is terminated or revoked.  Performed at [facility], [address], [place], [place] [address]   MRSA Next Gen by PCR, Nasal     Status: None   Collection Time: 09/16/23  1:30 PM   Specimen: Nasal Mucosa; Nasal Swab  Result Value Ref Range Status   MRSA by PCR Next Gen NOT DETECTED NOT DETECTED Final    Comment: (NOTE) The GeneXpert MRSA Assay (FDA approved for NASAL specimens only), is  one component of a comprehensive MRSA colonization surveillance program. It is not intended to diagnose MRSA infection nor to guide or monitor treatment for MRSA infections. Test performance is not FDA approved in patients less than 89 years old. Performed at [facility], [address], [place], [place] [address]     FURTHER DISCHARGE INSTRUCTIONS:   Get Medicines reviewed and adjusted: Please take all your medications with you for your next visit with your Primary MD   Laboratory/radiological data: Please request your Primary MD to go over all hospital tests and procedure/radiological results at the follow up, please ask your Primary MD to get all Hospital records sent to his/her office.   In some cases, they will be blood work, cultures and biopsy results pending at the time of your discharge. Please request that your primary care M.D. goes through all the records of your hospital data and follows up on these results.   Also Note the following: If you experience worsening of your admission symptoms, develop shortness of breath, life threatening emergency, suicidal or homicidal thoughts you must seek medical attention immediately by calling 911 or calling your MD immediately  if symptoms less severe.   You must read complete instructions/literature along with all the possible adverse reactions/side effects for all the Medicines you take and that have been prescribed to you. Take any new Medicines after you have completely understood and accpet all the possible adverse reactions/side effects.    Do not drive when taking Pain medications or sleeping medications (Benzodaizepines)   Do not take more than prescribed Pain, Sleep and Anxiety Medications. It is not advisable to combine anxiety,sleep and pain medications without talking with your primary care practitioner   Special Instructions: If you have smoked or chewed Tobacco  in the last 2 yrs please stop smoking, stop any regular  Alcohol  and or any Recreational drug use.   Wear Seat belts while driving.   Please note: You were cared for by a hospitalist during your hospital stay. Once you are discharged, your primary care physician will handle any further medical issues. Please note that NO REFILLS for any discharge medications will be authorized once you are discharged, as it is imperative that you return to your primary care physician (or establish a relationship with a primary care physician if you do not have one) for your post hospital discharge needs so that they can reassess your need for medications and monitor your lab values  Time coordinating discharge: Over 30 minutes  SIGNED:   Hughie Closs, MD  [facility] 09/20/2023, 2:43 PM *Please note that this is a verbal dictation therefore any spelling or grammatical errors are  due to the [employer]" system interpretation. If 7PM-7AM, please contact night-coverage [email or web address]

## 2023-09-20 NOTE — Plan of Care (Signed)
Problem: Elimination: Goal: Will not experience complications related to bowel motility Outcome: Progressing   Problem: Pain Managment: Goal: General experience of comfort will improve and/or be controlled Outcome: Progressing   Problem: Safety: Goal: Ability to remain free from injury will improve Outcome: Progressing

## 2023-09-20 NOTE — TOC Progression Note (Addendum)
Transition of Care [facility]) - Progression Note    Patient Details  Name: Katrina [name] MRN: [id number] Date of Birth: August 17, 1932  [facility] CM/SW Contact  Delilah Shan, LCSWA Phone Number: 09/20/2023, 11:06 AM  Clinical Narrative:     CSW spoke with patient at bedside. Patient confirmed she comes from [facility]. Patient confirmed plan is to return back to [facility] when medically ready. Patient informed CSW she would like [employer] van to pick her up if possible. CSW informed patient that CSW will check with facility to see if they can provide transportation. CSW called Ivy and LVM with [facility]. CSW awaiting call back.  Update- CSW received call back from [facility] with [facility] who confirmed patient can dc over today if medically ready. Amber informed CSW will just need Dc summary,Fl2, and HH orders for PT/OT. Fax# [phone]. Amber is checking to see if they can provide transportation for patient and will call CSW back once determined.   Update- CSW received call back from [facility] with [facility] who confirmed unable to provide transportation at [employer]. CSW informed Patient. Patient confirmed she would like to transport by [facility] when medically ready.  Expected Discharge Plan: Assisted Living Barriers to Discharge: Continued Medical Work up  Expected Discharge Plan and Services In-house Referral: Clinical Social Work     Living arrangements for the past 2 months: Assisted Living Facility                                       Social Determinants of Health (SDOH) Interventions SDOH Screenings   Food Insecurity: Patient Unable To Answer (09/16/2023)  Housing: Patient Unable To Answer (09/16/2023)  Transportation Needs: Patient Unable To Answer (09/16/2023)  Utilities: Patient Unable To Answer (09/16/2023)  Social Connections: Patient Unable To Answer (09/16/2023)  Tobacco Use: Low Risk  (02/03/2023)     Readmission Risk Interventions     No data to display

## 2023-10-06 ENCOUNTER — Ambulatory Visit: Payer: Medicare Other | Attending: Cardiology

## 2023-10-06 DIAGNOSIS — Z950 Presence of cardiac pacemaker: Secondary | ICD-10-CM

## 2023-10-06 DIAGNOSIS — I480 Paroxysmal atrial fibrillation: Secondary | ICD-10-CM | POA: Diagnosis not present

## 2023-10-06 LAB — CUP PACEART INCLINIC DEVICE CHECK
Battery Remaining Longevity: 135 mo
Battery Voltage: 2.92 V
Brady Statistic RA Percent Paced: 17 %
Brady Statistic RV Percent Paced: 99.96 %
Date Time Interrogation Session: 20250213111151
Implantable Lead Connection Status: 753985
Implantable Lead Connection Status: 753985
Implantable Lead Implant Date: 20250127
Implantable Lead Implant Date: 20250127
Implantable Lead Location: 753859
Implantable Lead Location: 753860
Implantable Pulse Generator Implant Date: 20250127
Lead Channel Impedance Value: 425 Ohm
Lead Channel Impedance Value: 762.5 Ohm
Lead Channel Pacing Threshold Amplitude: 0.375 V
Lead Channel Pacing Threshold Amplitude: 0.5 V
Lead Channel Pacing Threshold Pulse Width: 0.5 ms
Lead Channel Pacing Threshold Pulse Width: 0.5 ms
Lead Channel Sensing Intrinsic Amplitude: 3.9 mV
Lead Channel Sensing Intrinsic Amplitude: 9.9 mV
Lead Channel Setting Pacing Amplitude: 0.75 V
Lead Channel Setting Pacing Amplitude: 1.375
Lead Channel Setting Pacing Pulse Width: 0.5 ms
Lead Channel Setting Sensing Sensitivity: 4 mV
Pulse Gen Model: 2272
Pulse Gen Serial Number: 8242630

## 2023-10-06 NOTE — Progress Notes (Signed)
Normal Pacemaker wound check. Wound well healed. Thresholds, sensing, and impedances consistent with implant measurements and at 3.5V safety margin/auto capture until 3 month visit. No episodes. Reviewed arm restrictions to continue for 6 weeks total post op.  Pt enrolled in remote follow-up.

## 2023-10-06 NOTE — Patient Instructions (Addendum)

## 2023-11-04 ENCOUNTER — Ambulatory Visit (INDEPENDENT_AMBULATORY_CARE_PROVIDER_SITE_OTHER): Payer: Medicare Other

## 2023-11-04 DIAGNOSIS — I480 Paroxysmal atrial fibrillation: Secondary | ICD-10-CM

## 2023-11-04 LAB — CUP PACEART REMOTE DEVICE CHECK
Battery Remaining Longevity: 133 mo
Battery Remaining Percentage: 95.5 %
Battery Voltage: 3.02 V
Brady Statistic AP VP Percent: 13 %
Brady Statistic AP VS Percent: 1 %
Brady Statistic AS VP Percent: 87 %
Brady Statistic AS VS Percent: 1 %
Brady Statistic RA Percent Paced: 13 %
Brady Statistic RV Percent Paced: 99 %
Date Time Interrogation Session: 20250314020015
Implantable Lead Connection Status: 753985
Implantable Lead Connection Status: 753985
Implantable Lead Implant Date: 20250127
Implantable Lead Implant Date: 20250127
Implantable Lead Location: 753859
Implantable Lead Location: 753860
Implantable Pulse Generator Implant Date: 20250127
Lead Channel Impedance Value: 480 Ohm
Lead Channel Impedance Value: 790 Ohm
Lead Channel Pacing Threshold Amplitude: 0.5 V
Lead Channel Pacing Threshold Amplitude: 0.625 V
Lead Channel Pacing Threshold Pulse Width: 0.5 ms
Lead Channel Pacing Threshold Pulse Width: 0.5 ms
Lead Channel Sensing Intrinsic Amplitude: 3.4 mV
Lead Channel Sensing Intrinsic Amplitude: 9.9 mV
Lead Channel Setting Pacing Amplitude: 0.875
Lead Channel Setting Pacing Amplitude: 1.5 V
Lead Channel Setting Pacing Pulse Width: 0.5 ms
Lead Channel Setting Sensing Sensitivity: 4 mV
Pulse Gen Model: 2272
Pulse Gen Serial Number: 8242630

## 2023-12-08 NOTE — Progress Notes (Signed)
 Remote pacemaker transmission.

## 2023-12-28 ENCOUNTER — Ambulatory Visit: Payer: Medicare Other | Attending: Internal Medicine | Admitting: Internal Medicine

## 2023-12-28 ENCOUNTER — Encounter: Payer: Self-pay | Admitting: Internal Medicine

## 2023-12-28 VITALS — BP 118/72 | HR 70 | Ht 61.0 in | Wt 209.6 lb

## 2023-12-28 DIAGNOSIS — R001 Bradycardia, unspecified: Secondary | ICD-10-CM | POA: Diagnosis not present

## 2023-12-28 DIAGNOSIS — I480 Paroxysmal atrial fibrillation: Secondary | ICD-10-CM

## 2023-12-28 LAB — CUP PACEART INCLINIC DEVICE CHECK
Battery Remaining Longevity: 135 mo
Battery Voltage: 3.01 V
Brady Statistic RA Percent Paced: 6.6 %
Brady Statistic RV Percent Paced: 99.96 %
Date Time Interrogation Session: 20250507121736
Implantable Lead Connection Status: 753985
Implantable Lead Connection Status: 753985
Implantable Lead Implant Date: 20250127
Implantable Lead Implant Date: 20250127
Implantable Lead Location: 753859
Implantable Lead Location: 753860
Implantable Pulse Generator Implant Date: 20250127
Lead Channel Impedance Value: 475 Ohm
Lead Channel Impedance Value: 825 Ohm
Lead Channel Pacing Threshold Amplitude: 0.5 V
Lead Channel Pacing Threshold Amplitude: 0.5 V
Lead Channel Pacing Threshold Amplitude: 0.5 V
Lead Channel Pacing Threshold Amplitude: 0.5 V
Lead Channel Pacing Threshold Pulse Width: 0.5 ms
Lead Channel Pacing Threshold Pulse Width: 0.5 ms
Lead Channel Pacing Threshold Pulse Width: 0.5 ms
Lead Channel Pacing Threshold Pulse Width: 0.5 ms
Lead Channel Sensing Intrinsic Amplitude: 2.8 mV
Lead Channel Sensing Intrinsic Amplitude: 6.7 mV
Lead Channel Setting Pacing Amplitude: 0.75 V
Lead Channel Setting Pacing Amplitude: 1.5 V
Lead Channel Setting Pacing Pulse Width: 0.5 ms
Lead Channel Setting Sensing Sensitivity: 4 mV
Pulse Gen Model: 2272
Pulse Gen Serial Number: 8242630

## 2023-12-28 NOTE — Progress Notes (Signed)
 HPI Katrina [name] returns today for ongoing evaluation of tachy-brady syndrome s/p PPM Insertion. She has a h/o DVT, PE, and PAF and was admitted to the hospital and found to have high grade heart block as well as afib with a RVR despite a beta blocker. She has undergone insertion of a DDD PM just over 3 months ago. Since then she notes she has done well. No syncope, sob or chest pain. She has chronic leg swelling.  Allergies  Allergen Reactions   Codeine     Other reaction(s): nausea   Penicillins     Mother and brother have allergy, patient just does not take this Did PCN reaction causing immediate rash, facial/tongue/throat swelling, SOB or lightheadedness with hypotension: unknown Did PCN reaction causing severe rash involving mucus membranes or skin necrosis:unknown Did PCN reaction that required hospitalization : unknown Did PCN reaction occurring within the last 10 years: unknown Pt never actually took med. Did okay with ampicillin   Cephalosporins Rash    Dermatitis to keflex  in 2012- Has tolerated several courses since that time-  Most recently 2021. Notably patient has stasis dermatitis that developed around the same time.      Current Outpatient Medications  Medication Sig Dispense Refill   acetaminophen  (TYLENOL ) 325 MG tablet Take 650 mg by mouth every 6 (six) hours as needed for mild pain.     apixaban  (ELIQUIS ) 2.5 MG TABS tablet Take 1 tablet (2.5 mg total) by mouth 2 (two) times daily.     ketoconazole  (NIZORAL ) 2 % cream Apply 1 application topically daily.     levothyroxine  (SYNTHROID ) 125 MCG tablet Take 125 mcg by mouth daily.     losartan (COZAAR) 25 MG tablet Take 25 mg by mouth daily.     metoprolol  succinate (TOPROL -XL) 50 MG 24 hr tablet Take 50 mg by mouth daily.     Multiple Vitamins-Minerals (PRESERVISION AREDS 2 PO) Take 1 tablet by mouth daily.     polyvinyl alcohol  (LIQUIFILM TEARS) 1.4 % ophthalmic solution Place 1 drop into both eyes 4 (four) times  daily as needed for dry eyes.     potassium chloride  (MICRO-K ) 10 MEQ CR capsule Take 10 mEq by mouth daily.     Prenatal 28-0.8 MG TABS Take 1 tablet by mouth daily.     No current facility-administered medications for this visit.     Past Medical History:  Diagnosis Date   Atrial fibrillation (HCC)    DVT (deep venous thrombosis) (HCC)    Elevated lipids    Gallstone    Hypertension    Hypothyroidism    Nephrolithiasis    Overactive bladder    Pulmonary emboli (HCC)    while on HRT   SCC (squamous cell carcinoma)    of left leg   Sleep apnea    on C-pap   Vertigo     ROS:   All systems reviewed and negative except as noted in the HPI.   Past Surgical History:  Procedure Laterality Date   CHOLECYSTECTOMY     Dr. Leighton Punches   CYSTOSCOPY W/ URETERAL STENT PLACEMENT Right 09/26/2021   Procedure: CYSTOSCOPY WITH RETROGRADE PYELOGRAM/URETERAL STENT PLACEMENT;  Surgeon: Christina Coyer, MD;  Location: [facility];  Service: Urology;  Laterality: Right;   CYSTOSCOPY WITH STENT PLACEMENT  9/12   nephrolithaisis   CYSTOSCOPY/URETEROSCOPY/HOLMIUM LASER/STENT PLACEMENT Right 10/20/2021   Procedure: CYSTOSCOPY RIGHT URETEROSCOPY/HOLMIUM LASER/STENT EXCHANGE;  Surgeon: Homero Luster, MD;  Location: [facility];  Service: Urology;  Laterality: Right;   HYSTEROSCOPY WITH D & C  7/09   PMP with endometrial polyp   PACEMAKER IMPLANT N/A 09/19/2023   Procedure: PACEMAKER IMPLANT;  Surgeon: Tammie Fall, MD;  Location: [facility];  Service: Cardiovascular;  Laterality: N/A;   REPLACEMENT TOTAL KNEE Bilateral 1998, 2001       TEMPORARY PACEMAKER N/A 09/16/2023   Procedure: TEMPORARY PACEMAKER;  Surgeon: Pasqual Bone, MD;  Location: [facility];  Service: Cardiovascular;  Laterality: N/A;   TONSILLECTOMY AND ADENOIDECTOMY       Family History  Problem Relation Age of Onset   Hypertension Father    Heart disease Father    Hypertension Mother    Hyperlipidemia Mother    Heart  attack Mother    Prostate cancer Brother      Social History   Socioeconomic History   Marital status: Single    Spouse name: Not on file   Number of children: Not on file   Years of education: Not on file   Highest education level: Not on file  Occupational History   Not on file  Tobacco Use   Smoking status: Never   Smokeless tobacco: Never  Vaping Use   Vaping status: Never Used  Substance and Sexual Activity   Alcohol  use: No    Alcohol /week: 0.0 standard drinks of alcohol    Drug use: No   Sexual activity: Not Currently    Partners: Male    Birth control/protection: Post-menopausal  Other Topics Concern   Not on file  Social History Narrative   Not on file   Social Drivers of Health   Financial Resource Strain: Not on file  Food Insecurity: Patient Unable To Answer (09/16/2023)   Hunger Vital Sign    Worried About Running Out of Food in the Last Year: Patient unable to answer    Ran Out of Food in the Last Year: Patient unable to answer  Transportation Needs: Patient Unable To Answer (09/16/2023)   PRAPARE - Transportation    Lack of Transportation (Medical): Patient unable to answer    Lack of Transportation (Non-Medical): Patient unable to answer  Physical Activity: Not on file  Stress: Not on file  Social Connections: Patient Unable To Answer (09/16/2023)   Social Connection and Isolation Panel [NHANES]    Frequency of Communication with Friends and Family: Patient unable to answer    Frequency of Social Gatherings with Friends and Family: Patient unable to answer    Attends Religious Services: Patient unable to answer    Active Member of Clubs or Organizations: Patient unable to answer    Attends Banker Meetings: Patient unable to answer    Marital Status: Patient unable to answer  Intimate Partner Violence: Patient Unable To Answer (09/16/2023)   Humiliation, Afraid, Rape, and Kick questionnaire    Fear of Current or Ex-Partner: Patient unable  to answer    Emotionally Abused: Patient unable to answer    Physically Abused: Patient unable to answer    Sexually Abused: Patient unable to answer     BP 118/72   Pulse 70   Ht 5\' 1"  (1.549 m)   Wt 209 lb 9.6 oz (95.1 kg)   LMP 01/22/2011   SpO2 99%   BMI 39.60 kg/m   Physical Exam:  Well appearing 89 yo woman, NAD HEENT: Unremarkable Neck:  No JVD, no thyromegally Lymphatics:  No adenopathy Back:  No CVA tenderness Lungs:  Clear with no wheezes HEART:  Regular rate rhythm, no murmurs, no rubs, no clicks Abd:  soft, positive bowel sounds, no organomegally, no rebound, no guarding Ext:  2 plus pulses, 1+ edema bilaterally with venous stasis changes Skin:  No rashes no nodules Neuro:  CN II through XII intact, motor grossly intact  EKG P synchronous ventricular pacing  DEVICE  Normal device function.  See PaceArt for details.   Assess/Plan: Tachy-brady - she is much improved s/p PPM insertion.  PPM - Her [employer] DDD PM is working normally. 3. 2:1 AV block - she is stable after PPM insertion.   Katrina Brand Amayrani Bennick,MD

## 2023-12-28 NOTE — Patient Instructions (Signed)

## 2024-02-03 ENCOUNTER — Ambulatory Visit (INDEPENDENT_AMBULATORY_CARE_PROVIDER_SITE_OTHER): Payer: Medicare Other

## 2024-02-03 DIAGNOSIS — I480 Paroxysmal atrial fibrillation: Secondary | ICD-10-CM

## 2024-02-03 LAB — CUP PACEART REMOTE DEVICE CHECK
Battery Remaining Longevity: 128 mo
Battery Remaining Percentage: 95.5 %
Battery Voltage: 3.01 V
Brady Statistic AP VP Percent: 5.3 %
Brady Statistic AP VS Percent: 1 %
Brady Statistic AS VP Percent: 95 %
Brady Statistic AS VS Percent: 1 %
Brady Statistic RA Percent Paced: 5.1 %
Brady Statistic RV Percent Paced: 99 %
Date Time Interrogation Session: 20250613020015
Implantable Lead Connection Status: 753985
Implantable Lead Connection Status: 753985
Implantable Lead Implant Date: 20250127
Implantable Lead Implant Date: 20250127
Implantable Lead Location: 753859
Implantable Lead Location: 753860
Implantable Pulse Generator Implant Date: 20250127
Lead Channel Impedance Value: 460 Ohm
Lead Channel Impedance Value: 750 Ohm
Lead Channel Pacing Threshold Amplitude: 0.625 V
Lead Channel Pacing Threshold Amplitude: 0.75 V
Lead Channel Pacing Threshold Pulse Width: 0.5 ms
Lead Channel Pacing Threshold Pulse Width: 0.5 ms
Lead Channel Sensing Intrinsic Amplitude: 3 mV
Lead Channel Sensing Intrinsic Amplitude: 8.8 mV
Lead Channel Setting Pacing Amplitude: 1 V
Lead Channel Setting Pacing Amplitude: 1.625
Lead Channel Setting Pacing Pulse Width: 0.5 ms
Lead Channel Setting Sensing Sensitivity: 4 mV
Pulse Gen Model: 2272
Pulse Gen Serial Number: 8242630

## 2024-02-05 ENCOUNTER — Ambulatory Visit: Payer: Self-pay | Admitting: Internal Medicine

## 2024-03-22 NOTE — Progress Notes (Signed)
 Remote pacemaker transmission.

## 2024-05-04 ENCOUNTER — Ambulatory Visit: Payer: Medicare Other

## 2024-05-04 DIAGNOSIS — I480 Paroxysmal atrial fibrillation: Secondary | ICD-10-CM | POA: Diagnosis not present

## 2024-05-07 LAB — CUP PACEART REMOTE DEVICE CHECK
Battery Remaining Longevity: 128 mo
Battery Remaining Percentage: 95.5 %
Battery Voltage: 3.01 V
Brady Statistic AP VP Percent: 7.6 %
Brady Statistic AP VS Percent: 1 %
Brady Statistic AS VP Percent: 92 %
Brady Statistic AS VS Percent: 1 %
Brady Statistic RA Percent Paced: 6.6 %
Brady Statistic RV Percent Paced: 99 %
Date Time Interrogation Session: 20250912020015
Implantable Lead Connection Status: 753985
Implantable Lead Connection Status: 753985
Implantable Lead Implant Date: 20250127
Implantable Lead Implant Date: 20250127
Implantable Lead Location: 753859
Implantable Lead Location: 753860
Implantable Pulse Generator Implant Date: 20250127
Lead Channel Impedance Value: 480 Ohm
Lead Channel Impedance Value: 790 Ohm
Lead Channel Pacing Threshold Amplitude: 0.5 V
Lead Channel Pacing Threshold Amplitude: 0.625 V
Lead Channel Pacing Threshold Pulse Width: 0.5 ms
Lead Channel Pacing Threshold Pulse Width: 0.5 ms
Lead Channel Sensing Intrinsic Amplitude: 3.7 mV
Lead Channel Sensing Intrinsic Amplitude: 9.7 mV
Lead Channel Setting Pacing Amplitude: 0.75 V
Lead Channel Setting Pacing Amplitude: 1.625
Lead Channel Setting Pacing Pulse Width: 0.5 ms
Lead Channel Setting Sensing Sensitivity: 4 mV
Pulse Gen Model: 2272
Pulse Gen Serial Number: 8242630

## 2024-05-11 ENCOUNTER — Ambulatory Visit: Payer: Self-pay | Admitting: Internal Medicine

## 2024-05-11 NOTE — Progress Notes (Signed)
 Remote PPM Transmission

## 2024-08-03 ENCOUNTER — Ambulatory Visit: Payer: Medicare Other

## 2024-08-03 DIAGNOSIS — I480 Paroxysmal atrial fibrillation: Secondary | ICD-10-CM

## 2024-08-08 LAB — CUP PACEART REMOTE DEVICE CHECK
Battery Remaining Longevity: 124 mo
Battery Remaining Percentage: 94 %
Battery Voltage: 3.01 V
Brady Statistic AP VP Percent: 7.8 %
Brady Statistic AP VS Percent: 1 %
Brady Statistic AS VP Percent: 91 %
Brady Statistic AS VS Percent: 1 %
Brady Statistic RA Percent Paced: 7 %
Brady Statistic RV Percent Paced: 99 %
Date Time Interrogation Session: 20251212214303
Implantable Lead Connection Status: 753985
Implantable Lead Connection Status: 753985
Implantable Lead Implant Date: 20250127
Implantable Lead Implant Date: 20250127
Implantable Lead Location: 753859
Implantable Lead Location: 753860
Implantable Pulse Generator Implant Date: 20250127
Lead Channel Impedance Value: 440 Ohm
Lead Channel Impedance Value: 840 Ohm
Lead Channel Pacing Threshold Amplitude: 0.375 V
Lead Channel Pacing Threshold Amplitude: 0.5 V
Lead Channel Pacing Threshold Pulse Width: 0.5 ms
Lead Channel Pacing Threshold Pulse Width: 0.5 ms
Lead Channel Sensing Intrinsic Amplitude: 11.2 mV
Lead Channel Sensing Intrinsic Amplitude: 3.1 mV
Lead Channel Setting Pacing Amplitude: 0.75 V
Lead Channel Setting Pacing Amplitude: 1.375
Lead Channel Setting Pacing Pulse Width: 0.5 ms
Lead Channel Setting Sensing Sensitivity: 4 mV
Pulse Gen Model: 2272
Pulse Gen Serial Number: 8242630

## 2024-08-09 NOTE — Progress Notes (Signed)
 Remote PPM Transmission

## 2024-08-19 ENCOUNTER — Ambulatory Visit: Payer: Self-pay | Admitting: Internal Medicine
